# Patient Record
Sex: Female | Born: 1940 | Race: White | Hispanic: No | Marital: Married | Smoking: Never smoker
Health system: Southern US, Community
[De-identification: ages and names within clinical notes are randomized; demographics above are authoritative.]

## PROBLEM LIST (undated history)

## (undated) DIAGNOSIS — E079 Disorder of thyroid, unspecified: Secondary | ICD-10-CM

## (undated) DIAGNOSIS — M1990 Unspecified osteoarthritis, unspecified site: Secondary | ICD-10-CM

## (undated) DIAGNOSIS — C801 Malignant (primary) neoplasm, unspecified: Secondary | ICD-10-CM

## (undated) DIAGNOSIS — I10 Essential (primary) hypertension: Secondary | ICD-10-CM

## (undated) DIAGNOSIS — D649 Anemia, unspecified: Secondary | ICD-10-CM

## (undated) DIAGNOSIS — Z96659 Presence of unspecified artificial knee joint: Secondary | ICD-10-CM

## (undated) DIAGNOSIS — Z923 Personal history of irradiation: Secondary | ICD-10-CM

## (undated) DIAGNOSIS — C8510 Unspecified B-cell lymphoma, unspecified site: Secondary | ICD-10-CM

## (undated) HISTORY — DX: Unspecified osteoarthritis, unspecified site: M19.90

## (undated) HISTORY — DX: Presence of unspecified artificial knee joint: Z96.659

## (undated) HISTORY — PX: TOTAL KNEE ARTHROPLASTY: SHX125

## (undated) HISTORY — DX: Personal history of irradiation: Z92.3

## (undated) HISTORY — DX: Disorder of thyroid, unspecified: E07.9

## (undated) HISTORY — DX: Essential (primary) hypertension: I10

## (undated) HISTORY — DX: Unspecified B-cell lymphoma, unspecified site: C85.10

---

## 2005-09-04 ENCOUNTER — Encounter: Admission: RE | Admit: 2005-09-04 | Discharge: 2005-12-03 | Payer: Self-pay | Admitting: *Deleted

## 2006-01-16 ENCOUNTER — Encounter: Admission: RE | Admit: 2006-01-16 | Discharge: 2006-01-16 | Payer: Self-pay | Admitting: Otolaryngology

## 2010-01-18 ENCOUNTER — Ambulatory Visit (HOSPITAL_COMMUNITY): Admission: RE | Admit: 2010-01-18 | Discharge: 2010-01-18 | Payer: Self-pay | Admitting: Orthopedic Surgery

## 2010-03-20 ENCOUNTER — Inpatient Hospital Stay (HOSPITAL_COMMUNITY): Admission: RE | Admit: 2010-03-20 | Discharge: 2010-03-23 | Payer: Self-pay | Admitting: Orthopedic Surgery

## 2010-11-11 LAB — CBC
HCT: 30.9 % — ABNORMAL LOW (ref 36.0–46.0)
HCT: 35.1 % — ABNORMAL LOW (ref 36.0–46.0)
HCT: 37.7 % (ref 36.0–46.0)
Hemoglobin: 10.1 g/dL — ABNORMAL LOW (ref 12.0–15.0)
Hemoglobin: 10.6 g/dL — ABNORMAL LOW (ref 12.0–15.0)
Hemoglobin: 12 g/dL (ref 12.0–15.0)
Hemoglobin: 13 g/dL (ref 12.0–15.0)
MCH: 30 pg (ref 26.0–34.0)
MCH: 30.3 pg (ref 26.0–34.0)
MCHC: 34.3 g/dL (ref 30.0–36.0)
MCV: 86.9 fL (ref 78.0–100.0)
MCV: 88.1 fL (ref 78.0–100.0)
MCV: 88.3 fL (ref 78.0–100.0)
Platelets: 249 10*3/uL (ref 150–400)
RDW: 13.5 % (ref 11.5–15.5)
RDW: 13.9 % (ref 11.5–15.5)
WBC: 10.8 10*3/uL — ABNORMAL HIGH (ref 4.0–10.5)
WBC: 12.4 10*3/uL — ABNORMAL HIGH (ref 4.0–10.5)
WBC: 6.7 10*3/uL (ref 4.0–10.5)

## 2010-11-11 LAB — BASIC METABOLIC PANEL
BUN: 12 mg/dL (ref 6–23)
BUN: 13 mg/dL (ref 6–23)
Chloride: 108 mEq/L (ref 96–112)
GFR calc non Af Amer: >60 mL/min (ref >60)
Sodium: 139 mEq/L (ref 135–145)
Sodium: 140 mEq/L (ref 135–145)

## 2010-11-11 LAB — URINALYSIS, ROUTINE W REFLEX MICROSCOPIC
Nitrite: NEGATIVE
Protein, ur: NEGATIVE mg/dL
Urobilinogen, UA: 0.2 mg/dL (ref 0.0–1.0)

## 2010-11-11 LAB — GRAM STAIN: Gram Stain: NONE SEEN

## 2010-11-11 LAB — PROTIME-INR
INR: 1.04 (ref 0.00–1.49)
INR: 1.31 (ref 0.00–1.49)
Prothrombin Time: 13.5 seconds (ref 11.6–15.2)
Prothrombin Time: 13.7 seconds (ref 11.6–15.2)
Prothrombin Time: 16.2 seconds — ABNORMAL HIGH (ref 11.6–15.2)

## 2010-11-11 LAB — COMPREHENSIVE METABOLIC PANEL
AST: 19 U/L (ref 0–37)
Creatinine, Ser: 0.76 mg/dL (ref 0.4–1.2)
GFR calc non Af Amer: >60 mL/min (ref >60)

## 2010-11-11 LAB — URINE MICROSCOPIC-ADD ON

## 2010-11-11 LAB — APTT: aPTT: 30 seconds (ref 24–37)

## 2010-11-11 LAB — TYPE AND SCREEN
ABO/RH(D): A POS
Antibody Screen: NEGATIVE

## 2010-11-11 LAB — ANAEROBIC CULTURE

## 2010-11-11 LAB — WOUND CULTURE

## 2010-11-11 LAB — SURGICAL PCR SCREEN: MRSA, PCR: NEGATIVE

## 2011-04-06 ENCOUNTER — Other Ambulatory Visit: Payer: Self-pay | Admitting: Emergency Medicine

## 2011-04-06 DIAGNOSIS — R590 Localized enlarged lymph nodes: Secondary | ICD-10-CM

## 2011-04-09 ENCOUNTER — Ambulatory Visit
Admission: RE | Admit: 2011-04-09 | Discharge: 2011-04-09 | Disposition: A | Discharge status: Home / Self Care | Payer: Medicare Other | Source: Ambulatory Visit | Attending: Emergency Medicine | Admitting: Emergency Medicine

## 2011-04-09 DIAGNOSIS — R590 Localized enlarged lymph nodes: Secondary | ICD-10-CM

## 2011-04-09 MED ORDER — IOHEXOL 300 MG/ML  SOLN
75.0000 mL | Freq: Once | INTRAMUSCULAR | Status: AC | PRN
  Administered 2011-04-09: 75 mL via INTRAVENOUS

## 2011-04-12 ENCOUNTER — Other Ambulatory Visit: Payer: Self-pay | Admitting: Emergency Medicine

## 2011-04-12 DIAGNOSIS — R599 Enlarged lymph nodes, unspecified: Secondary | ICD-10-CM

## 2011-04-17 ENCOUNTER — Ambulatory Visit (HOSPITAL_COMMUNITY)
Admission: RE | Admit: 2011-04-17 | Discharge: 2011-04-17 | Disposition: A | Discharge status: Home / Self Care | Payer: Medicare Other | Source: Ambulatory Visit | Attending: Emergency Medicine | Admitting: Emergency Medicine

## 2011-04-17 ENCOUNTER — Other Ambulatory Visit: Payer: Self-pay | Admitting: Interventional Radiology

## 2011-04-17 DIAGNOSIS — R599 Enlarged lymph nodes, unspecified: Secondary | ICD-10-CM | POA: Insufficient documentation

## 2011-04-17 LAB — BASIC METABOLIC PANEL
BUN: 21 mg/dL (ref 6–23)
Calcium: 10.1 mg/dL (ref 8.4–10.5)
Creatinine, Ser: 0.64 mg/dL (ref 0.50–1.10)
GFR calc non Af Amer: >60 mL/min (ref >60)
Glucose, Bld: 91 mg/dL (ref 70–99)
Sodium: 140 mEq/L (ref 135–145)

## 2011-04-17 LAB — CBC
MCH: 28.8 pg (ref 26.0–34.0)
Platelets: 298 10*3/uL (ref 150–400)
RBC: 4.75 MIL/uL (ref 3.87–5.11)

## 2011-04-17 LAB — PROTIME-INR: Prothrombin Time: 12.9 seconds (ref 11.6–15.2)

## 2011-04-18 ENCOUNTER — Other Ambulatory Visit: Payer: Self-pay | Admitting: Interventional Radiology

## 2011-04-26 ENCOUNTER — Other Ambulatory Visit: Payer: Self-pay | Admitting: Otolaryngology

## 2011-05-03 ENCOUNTER — Encounter (HOSPITAL_BASED_OUTPATIENT_CLINIC_OR_DEPARTMENT_OTHER)
Admission: RE | Admit: 2011-05-03 | Discharge: 2011-05-03 | Disposition: A | Discharge status: Home / Self Care | Payer: Medicare Other | Source: Ambulatory Visit | Attending: Otolaryngology | Admitting: Otolaryngology

## 2011-05-03 LAB — BASIC METABOLIC PANEL
BUN: 29 mg/dL — ABNORMAL HIGH (ref 6–23)
Calcium: 9.2 mg/dL (ref 8.4–10.5)
Creatinine, Ser: 0.64 mg/dL (ref 0.50–1.10)
GFR calc Af Amer: >60 mL/min (ref >60)
GFR calc non Af Amer: >60 mL/min (ref >60)
Potassium: 4.8 mEq/L (ref 3.5–5.1)

## 2011-05-04 ENCOUNTER — Other Ambulatory Visit: Payer: Self-pay | Admitting: Otolaryngology

## 2011-05-04 ENCOUNTER — Ambulatory Visit (HOSPITAL_BASED_OUTPATIENT_CLINIC_OR_DEPARTMENT_OTHER)
Admission: RE | Admit: 2011-05-04 | Discharge: 2011-05-04 | Disposition: A | Discharge status: Home / Self Care | Payer: Medicare Other | Source: Ambulatory Visit | Attending: Otolaryngology | Admitting: Otolaryngology

## 2011-05-04 DIAGNOSIS — I10 Essential (primary) hypertension: Secondary | ICD-10-CM | POA: Insufficient documentation

## 2011-05-04 DIAGNOSIS — Z01812 Encounter for preprocedural laboratory examination: Secondary | ICD-10-CM | POA: Insufficient documentation

## 2011-05-04 DIAGNOSIS — C8510 Unspecified B-cell lymphoma, unspecified site: Secondary | ICD-10-CM

## 2011-05-04 DIAGNOSIS — R599 Enlarged lymph nodes, unspecified: Secondary | ICD-10-CM | POA: Insufficient documentation

## 2011-05-04 HISTORY — PX: NECK LESION BIOPSY: SHX2078

## 2011-05-04 HISTORY — DX: Unspecified B-cell lymphoma, unspecified site: C85.10

## 2011-05-18 ENCOUNTER — Other Ambulatory Visit: Payer: Self-pay | Admitting: Hematology and Oncology

## 2011-05-18 ENCOUNTER — Encounter: Payer: Managed Care, Other (non HMO) | Admitting: Hematology and Oncology

## 2011-05-18 ENCOUNTER — Encounter (HOSPITAL_BASED_OUTPATIENT_CLINIC_OR_DEPARTMENT_OTHER): Payer: Medicare Other | Admitting: Hematology and Oncology

## 2011-05-18 DIAGNOSIS — C8581 Other specified types of non-Hodgkin lymphoma, lymph nodes of head, face, and neck: Secondary | ICD-10-CM

## 2011-05-18 DIAGNOSIS — C8588 Other specified types of non-Hodgkin lymphoma, lymph nodes of multiple sites: Secondary | ICD-10-CM

## 2011-05-18 DIAGNOSIS — C8590 Non-Hodgkin lymphoma, unspecified, unspecified site: Secondary | ICD-10-CM

## 2011-05-18 LAB — CBC WITH DIFFERENTIAL/PLATELET
EOS%: 1.1 % (ref 0.0–7.0)
MCH: 29.7 pg (ref 25.1–34.0)
MCHC: 34 g/dL (ref 31.5–36.0)
MCV: 87.5 fL (ref 79.5–101.0)
MONO%: 6.1 % (ref 0.0–14.0)
RBC: 4.6 10*6/uL (ref 3.70–5.45)
RDW: 13.5 % (ref 11.2–14.5)

## 2011-05-19 LAB — HEPATITIS B SURFACE ANTIGEN: Hepatitis B Surface Ag: NEGATIVE

## 2011-05-19 LAB — COMPREHENSIVE METABOLIC PANEL
Albumin: 4.4 g/dL (ref 3.5–5.2)
Alkaline Phosphatase: 90 U/L (ref 39–117)
BUN: 20 mg/dL (ref 6–23)
Calcium: 9.2 mg/dL (ref 8.4–10.5)
Creatinine, Ser: 0.78 mg/dL (ref 0.50–1.10)
Glucose, Bld: 96 mg/dL (ref 70–99)
Potassium: 4.9 mEq/L (ref 3.5–5.3)

## 2011-05-19 LAB — LACTATE DEHYDROGENASE: LDH: 245 U/L (ref 94–250)

## 2011-05-19 LAB — HEPATITIS A ANTIBODY, TOTAL: Hep A Total Ab: POSITIVE — AB

## 2011-05-19 LAB — URIC ACID: Uric Acid, Serum: 6.5 mg/dL (ref 2.4–7.0)

## 2011-05-21 ENCOUNTER — Other Ambulatory Visit: Payer: Self-pay | Admitting: Hematology and Oncology

## 2011-05-21 DIAGNOSIS — C8590 Non-Hodgkin lymphoma, unspecified, unspecified site: Secondary | ICD-10-CM

## 2011-05-22 ENCOUNTER — Other Ambulatory Visit: Payer: Self-pay | Admitting: Interventional Radiology

## 2011-05-22 ENCOUNTER — Ambulatory Visit (HOSPITAL_COMMUNITY): Payer: Medicare Other

## 2011-05-22 ENCOUNTER — Ambulatory Visit (HOSPITAL_COMMUNITY)
Admission: RE | Admit: 2011-05-22 | Discharge: 2011-05-22 | Disposition: A | Discharge status: Home / Self Care | Payer: Medicare Other | Source: Ambulatory Visit | Attending: Hematology and Oncology | Admitting: Hematology and Oncology

## 2011-05-22 DIAGNOSIS — I10 Essential (primary) hypertension: Secondary | ICD-10-CM | POA: Insufficient documentation

## 2011-05-22 DIAGNOSIS — E039 Hypothyroidism, unspecified: Secondary | ICD-10-CM | POA: Insufficient documentation

## 2011-05-22 DIAGNOSIS — C8589 Other specified types of non-Hodgkin lymphoma, extranodal and solid organ sites: Secondary | ICD-10-CM | POA: Insufficient documentation

## 2011-05-22 DIAGNOSIS — Z79899 Other long term (current) drug therapy: Secondary | ICD-10-CM | POA: Insufficient documentation

## 2011-05-22 DIAGNOSIS — C8590 Non-Hodgkin lymphoma, unspecified, unspecified site: Secondary | ICD-10-CM

## 2011-05-22 DIAGNOSIS — M1990 Unspecified osteoarthritis, unspecified site: Secondary | ICD-10-CM | POA: Insufficient documentation

## 2011-05-22 LAB — CBC
MCH: 29.1 pg (ref 26.0–34.0)
Platelets: 267 10*3/uL (ref 150–400)
RBC: 4.61 MIL/uL (ref 3.87–5.11)
RDW: 13.4 % (ref 11.5–15.5)
WBC: 5.8 10*3/uL (ref 4.0–10.5)

## 2011-05-22 LAB — PROTIME-INR: Prothrombin Time: 13.9 seconds (ref 11.6–15.2)

## 2011-05-24 NOTE — Op Note (Signed)
[name], [name]                 ACCOUNT NO.:  [id number]  MEDICAL RECORD NO.:  [id number]  LOCATION:                                 FACILITY:  PHYSICIAN:  Kristine Garbe. Ezzard Standing, M.D.DATE OF BIRTH:  July 19, 1941  DATE OF PROCEDURE:  05/04/2011 DATE OF DISCHARGE:                              OPERATIVE REPORT   PREOPERATIVE DIAGNOSIS:  Right neck lymphadenopathy.  POSTOPERATIVE DIAGNOSIS:  Right neck lymphadenopathy.  OPERATIONS:  Excisional biopsy of 2 posterior deep cervical right neck nodes.  SURGEON:  Kristine Garbe. Ezzard Standing, MD  ANESTHESIA:  General endotracheal.  COMPLICATIONS:  None.  BRIEF CLINICAL NOTE:  Kelly Ray is a 70 year old female who has had persistent right neck lymphadenopathy for a couple of months now.  She had a previous excisional biopsy of the largest node in the right neck which was along the accessory chain of lymph nodes performed 1 week ago. Unfortunately, the results of this biopsy revealed the node to be very necrotic and that could not get adequate tissue diagnosis of the initial lymph node biopsy as there was minimal viable cells in the lymph node. She has multiple other smaller nodes in the right neck and she is taken to operating room at this time for excisional biopsy of further nodes in the right neck.  We will plan frozen section to make sure we have enough viable lymph node to make an adequate diagnosis.  DESCRIPTION OF PROCEDURE:  After adequate endotracheal anesthesia, the right neck was prepped with Betadine solution and draped out with sterile towels.  Two separate nodes were marked out superiorly to where the previous biopsy was performed and a third node was marked out in the supraclavicular area in the right lower neck.  Initial incision was made just superior to the previous incision in the upper accessory chain of lymph node region.  Dissection was carried down through the subcutaneous tissue.  The posterior portion of the  sternocleidomastoid muscle was identified and this was divided a little bit to gain adequate access to the nodes.  Care was taken to preserve the sensory nerve.  Two nodes were identified in this area both measuring approximately 1-1.5 cm in size.  Both nodes were removed and sent in saline to Pathology.  On frozen section, Dr. Luisa Hart felt that we had enough viable tissue to make an accurate diagnosis and suspected this may represent a high-grade lymphoma.  This completed the procedure.  Hemostasis was obtained with cautery.  The wound was irrigated and closed with 3-0 chromic sutures subcutaneously and 6-0 nylon and Steri-Strips to reapproximate skin edges.  Dressing was applied.  [name] was awoke from anesthesia and transferred to the recovery room postop doing well.  DISPOSITION:  [name] was discharged home later this morning on Tylenol and Vicodin p.r.n. pain.  We will have her follow up in my office in 5 days to have sutures removed and review of final pathology.          ______________________________ Kristine Garbe Ezzard Standing, M.D.     CEN/MEDQ  D:  05/04/2011  T:  05/04/2011  Job:  [id number]  cc:   Brett Canales A. Cleta Alberts, M.D.  Electronically Signed by  Dillard Cannon M.D. on 05/24/2011 01:30:16 AM

## 2011-05-29 ENCOUNTER — Encounter (HOSPITAL_COMMUNITY)
Admission: RE | Admit: 2011-05-29 | Discharge: 2011-05-29 | Disposition: A | Discharge status: Home / Self Care | Payer: Medicare Other | Source: Ambulatory Visit | Attending: Hematology and Oncology | Admitting: Hematology and Oncology

## 2011-05-29 DIAGNOSIS — C8588 Other specified types of non-Hodgkin lymphoma, lymph nodes of multiple sites: Secondary | ICD-10-CM | POA: Insufficient documentation

## 2011-05-29 DIAGNOSIS — C8590 Non-Hodgkin lymphoma, unspecified, unspecified site: Secondary | ICD-10-CM

## 2011-05-29 DIAGNOSIS — R599 Enlarged lymph nodes, unspecified: Secondary | ICD-10-CM | POA: Insufficient documentation

## 2011-05-29 DIAGNOSIS — Z90710 Acquired absence of both cervix and uterus: Secondary | ICD-10-CM | POA: Insufficient documentation

## 2011-05-29 LAB — GLUCOSE, CAPILLARY: Glucose-Capillary: 96 mg/dL (ref 70–99)

## 2011-05-29 MED ORDER — FLUDEOXYGLUCOSE F - 18 (FDG) INJECTION
18.9000 | Freq: Once | INTRAVENOUS | Status: AC | PRN
  Administered 2011-05-29: 18.9 via INTRAVENOUS

## 2011-05-30 ENCOUNTER — Encounter (HOSPITAL_BASED_OUTPATIENT_CLINIC_OR_DEPARTMENT_OTHER): Payer: Medicare Other | Admitting: Hematology and Oncology

## 2011-05-30 DIAGNOSIS — C8588 Other specified types of non-Hodgkin lymphoma, lymph nodes of multiple sites: Secondary | ICD-10-CM

## 2011-05-30 DIAGNOSIS — Z23 Encounter for immunization: Secondary | ICD-10-CM

## 2011-05-31 ENCOUNTER — Ambulatory Visit (HOSPITAL_COMMUNITY)
Admission: RE | Admit: 2011-05-31 | Discharge: 2011-05-31 | Disposition: A | Discharge status: Home / Self Care | Payer: Medicare Other | Source: Ambulatory Visit | Attending: Hematology and Oncology | Admitting: Hematology and Oncology

## 2011-05-31 DIAGNOSIS — I379 Nonrheumatic pulmonary valve disorder, unspecified: Secondary | ICD-10-CM | POA: Insufficient documentation

## 2011-05-31 DIAGNOSIS — I059 Rheumatic mitral valve disease, unspecified: Secondary | ICD-10-CM

## 2011-05-31 DIAGNOSIS — I080 Rheumatic disorders of both mitral and aortic valves: Secondary | ICD-10-CM | POA: Insufficient documentation

## 2011-05-31 DIAGNOSIS — C50919 Malignant neoplasm of unspecified site of unspecified female breast: Secondary | ICD-10-CM | POA: Insufficient documentation

## 2011-05-31 DIAGNOSIS — Z01818 Encounter for other preprocedural examination: Secondary | ICD-10-CM | POA: Insufficient documentation

## 2011-06-01 ENCOUNTER — Ambulatory Visit (HOSPITAL_COMMUNITY)
Admission: RE | Admit: 2011-06-01 | Discharge: 2011-06-01 | Disposition: A | Discharge status: Home / Self Care | Payer: Medicare Other | Source: Ambulatory Visit | Attending: Hematology and Oncology | Admitting: Hematology and Oncology

## 2011-06-01 ENCOUNTER — Other Ambulatory Visit: Payer: Self-pay | Admitting: Hematology and Oncology

## 2011-06-01 ENCOUNTER — Other Ambulatory Visit (HOSPITAL_COMMUNITY): Payer: Managed Care, Other (non HMO)

## 2011-06-01 DIAGNOSIS — C8590 Non-Hodgkin lymphoma, unspecified, unspecified site: Secondary | ICD-10-CM

## 2011-06-01 DIAGNOSIS — C8589 Other specified types of non-Hodgkin lymphoma, extranodal and solid organ sites: Secondary | ICD-10-CM | POA: Insufficient documentation

## 2011-06-01 DIAGNOSIS — E039 Hypothyroidism, unspecified: Secondary | ICD-10-CM | POA: Insufficient documentation

## 2011-06-01 DIAGNOSIS — I10 Essential (primary) hypertension: Secondary | ICD-10-CM | POA: Insufficient documentation

## 2011-06-06 ENCOUNTER — Encounter (HOSPITAL_BASED_OUTPATIENT_CLINIC_OR_DEPARTMENT_OTHER): Payer: Medicare Other | Admitting: Hematology and Oncology

## 2011-06-06 DIAGNOSIS — C8588 Other specified types of non-Hodgkin lymphoma, lymph nodes of multiple sites: Secondary | ICD-10-CM

## 2011-06-06 DIAGNOSIS — Z5111 Encounter for antineoplastic chemotherapy: Secondary | ICD-10-CM

## 2011-06-07 ENCOUNTER — Encounter (HOSPITAL_BASED_OUTPATIENT_CLINIC_OR_DEPARTMENT_OTHER): Payer: Medicare Other | Admitting: Hematology and Oncology

## 2011-06-07 DIAGNOSIS — C8588 Other specified types of non-Hodgkin lymphoma, lymph nodes of multiple sites: Secondary | ICD-10-CM

## 2011-06-22 ENCOUNTER — Encounter: Payer: Self-pay | Admitting: *Deleted

## 2011-06-26 ENCOUNTER — Other Ambulatory Visit: Payer: Self-pay | Admitting: Hematology and Oncology

## 2011-06-26 ENCOUNTER — Encounter (HOSPITAL_BASED_OUTPATIENT_CLINIC_OR_DEPARTMENT_OTHER): Payer: Medicare Other | Admitting: Hematology and Oncology

## 2011-06-26 DIAGNOSIS — C8588 Other specified types of non-Hodgkin lymphoma, lymph nodes of multiple sites: Secondary | ICD-10-CM

## 2011-06-26 DIAGNOSIS — Z79899 Other long term (current) drug therapy: Secondary | ICD-10-CM

## 2011-06-26 DIAGNOSIS — C8589 Other specified types of non-Hodgkin lymphoma, extranodal and solid organ sites: Secondary | ICD-10-CM

## 2011-06-26 DIAGNOSIS — Z23 Encounter for immunization: Secondary | ICD-10-CM

## 2011-06-26 LAB — COMPREHENSIVE METABOLIC PANEL
ALT: 14 U/L (ref 0–35)
CO2: 29 mEq/L (ref 19–32)
Calcium: 9.5 mg/dL (ref 8.4–10.5)
Chloride: 101 mEq/L (ref 96–112)
Creatinine, Ser: 0.87 mg/dL (ref 0.50–1.10)
Glucose, Bld: 93 mg/dL (ref 70–99)
Sodium: 137 mEq/L (ref 135–145)
Total Protein: 6.3 g/dL (ref 6.0–8.3)

## 2011-06-26 LAB — CBC WITH DIFFERENTIAL/PLATELET
BASO%: 0.6 % (ref 0.0–2.0)
Eosinophils Absolute: 0 10*3/uL (ref 0.0–0.5)
HCT: 36.1 % (ref 34.8–46.6)
MCHC: 33 g/dL (ref 31.5–36.0)
MONO#: 0.8 10*3/uL (ref 0.1–0.9)
NEUT#: 5.7 10*3/uL (ref 1.5–6.5)
NEUT%: 71.6 % (ref 38.4–76.8)
WBC: 7.9 10*3/uL (ref 3.9–10.3)
lymph#: 1.4 10*3/uL (ref 0.9–3.3)

## 2011-06-27 ENCOUNTER — Encounter (HOSPITAL_BASED_OUTPATIENT_CLINIC_OR_DEPARTMENT_OTHER): Payer: Medicare Other | Admitting: Hematology and Oncology

## 2011-06-27 DIAGNOSIS — C8588 Other specified types of non-Hodgkin lymphoma, lymph nodes of multiple sites: Secondary | ICD-10-CM

## 2011-06-27 DIAGNOSIS — Z5111 Encounter for antineoplastic chemotherapy: Secondary | ICD-10-CM

## 2011-06-28 ENCOUNTER — Encounter (HOSPITAL_BASED_OUTPATIENT_CLINIC_OR_DEPARTMENT_OTHER): Payer: Medicare Other | Admitting: Hematology and Oncology

## 2011-06-28 DIAGNOSIS — C8588 Other specified types of non-Hodgkin lymphoma, lymph nodes of multiple sites: Secondary | ICD-10-CM

## 2011-06-29 ENCOUNTER — Encounter (HOSPITAL_BASED_OUTPATIENT_CLINIC_OR_DEPARTMENT_OTHER): Payer: Medicare Other | Admitting: Hematology and Oncology

## 2011-06-29 DIAGNOSIS — Z5111 Encounter for antineoplastic chemotherapy: Secondary | ICD-10-CM

## 2011-06-29 DIAGNOSIS — C8588 Other specified types of non-Hodgkin lymphoma, lymph nodes of multiple sites: Secondary | ICD-10-CM

## 2011-06-30 ENCOUNTER — Encounter: Payer: Medicare Other | Admitting: Hematology and Oncology

## 2011-07-03 LAB — COMPREHENSIVE METABOLIC PANEL
ALT: 13 U/L (ref 0–35)
Albumin: 4.4 g/dL (ref 3.5–5.2)
BUN: 20 mg/dL (ref 6–23)
BUN: 20 mg/dL (ref 6–23)
CO2: 26 mEq/L (ref 19–32)
CO2: 26 mEq/L (ref 19–32)
Calcium: 9.2 mg/dL (ref 8.4–10.5)
Calcium: 9.2 mg/dL (ref 8.4–10.5)
Chloride: 101 mEq/L (ref 96–112)
Chloride: 101 mEq/L (ref 96–112)
Creatinine, Ser: 0.78 mg/dL (ref 0.50–1.10)
Glucose, Bld: 96 mg/dL (ref 70–99)
Glucose, Bld: 96 mg/dL (ref 70–99)
Potassium: 4.9 mEq/L (ref 3.5–5.3)
Sodium: 139 mEq/L (ref 135–145)
Total Protein: 6.9 g/dL (ref 6.0–8.3)

## 2011-07-03 LAB — LACTATE DEHYDROGENASE
LDH: 245 U/L (ref 94–250)
LDH: 245 U/L (ref 94–250)

## 2011-07-03 LAB — SEDIMENTATION RATE
Sed Rate: 7 mm/hr (ref 0–22)
Sed Rate: 7 mm/hr (ref 0–22)

## 2011-07-03 LAB — HEPATITIS B SURFACE ANTIGEN: Hepatitis B Surface Ag: NEGATIVE

## 2011-07-03 LAB — HEPATITIS B SURFACE ANTIBODY,QUALITATIVE: Hep B S Ab: NEGATIVE

## 2011-07-03 LAB — URIC ACID: Uric Acid, Serum: 6.5 mg/dL (ref 2.4–7.0)

## 2011-07-12 ENCOUNTER — Encounter (HOSPITAL_COMMUNITY): Payer: Self-pay

## 2011-07-12 ENCOUNTER — Telehealth: Payer: Self-pay | Admitting: *Deleted

## 2011-07-12 ENCOUNTER — Encounter (HOSPITAL_COMMUNITY)
Admission: RE | Admit: 2011-07-12 | Discharge: 2011-07-12 | Disposition: A | Discharge status: Home / Self Care | Payer: Medicare Other | Source: Ambulatory Visit | Attending: Hematology and Oncology | Admitting: Hematology and Oncology

## 2011-07-12 DIAGNOSIS — C8588 Other specified types of non-Hodgkin lymphoma, lymph nodes of multiple sites: Secondary | ICD-10-CM | POA: Insufficient documentation

## 2011-07-12 MED ORDER — FLUDEOXYGLUCOSE F - 18 (FDG) INJECTION
15.9000 | Freq: Once | INTRAVENOUS | Status: AC | PRN
  Administered 2011-07-12: 15.9 via INTRAVENOUS

## 2011-07-12 NOTE — Telephone Encounter (Signed)
Spoke with pt today and informed pt re:   PET scan result   Good  As per  Md.    Instructed pt to keep  F/u app as scheduled.    Pt voiced understanding.

## 2011-07-13 ENCOUNTER — Other Ambulatory Visit: Payer: Self-pay | Admitting: Nurse Practitioner

## 2011-07-13 ENCOUNTER — Telehealth: Payer: Self-pay | Admitting: Nurse Practitioner

## 2011-07-13 DIAGNOSIS — C8588 Other specified types of non-Hodgkin lymphoma, lymph nodes of multiple sites: Secondary | ICD-10-CM

## 2011-07-13 MED ORDER — TEMAZEPAM 15 MG PO CAPS: 15.0000 mg | ORAL_CAPSULE | Freq: Every evening | ORAL | Status: DC | PRN

## 2011-07-13 NOTE — Telephone Encounter (Signed)
RN called patient re: refill request of temazepam.  Noted pt had fill on 06/13/11 of 30 day supply and was not to be taking it every day.  Informed pt that Dr. Dalene Carrow was reluctant to refill prescription due to she was not to be taking it daily, only as needed.  Patient stated she had been taking it every day due to difficulty sleeping.   Pt stated she had never had trouble sleeping before she started chemotherapy treatments.  States that steroids from treatment and pain from Neulasta injection keep her awake. RN noted her chemo treatments were every three weeks.  RN encouraged pt to use non-medical interventions such as avoiding television, computer screen or other bright lights an hour before she plans to sleep, taking a warm bath or shower before bed, and using progressive relaxation techniques during non-treatment weeks.  Pt verbalized understanding.  RN stated would give information to Dr. Dalene Carrow and call with further information.

## 2011-07-13 NOTE — Telephone Encounter (Signed)
Called patient and informed per Dr. Dalene Carrow- Temazepam prescription refilled for 15 capsules.

## 2011-07-17 ENCOUNTER — Other Ambulatory Visit: Payer: Self-pay | Admitting: Hematology and Oncology

## 2011-07-17 ENCOUNTER — Ambulatory Visit (HOSPITAL_BASED_OUTPATIENT_CLINIC_OR_DEPARTMENT_OTHER): Payer: Medicare Other | Admitting: Physician Assistant

## 2011-07-17 ENCOUNTER — Other Ambulatory Visit: Payer: Self-pay | Admitting: *Deleted

## 2011-07-17 ENCOUNTER — Other Ambulatory Visit (HOSPITAL_BASED_OUTPATIENT_CLINIC_OR_DEPARTMENT_OTHER): Payer: Medicare Other | Admitting: Lab

## 2011-07-17 DIAGNOSIS — C8510 Unspecified B-cell lymphoma, unspecified site: Secondary | ICD-10-CM | POA: Insufficient documentation

## 2011-07-17 DIAGNOSIS — C8590 Non-Hodgkin lymphoma, unspecified, unspecified site: Secondary | ICD-10-CM

## 2011-07-17 DIAGNOSIS — C8588 Other specified types of non-Hodgkin lymphoma, lymph nodes of multiple sites: Secondary | ICD-10-CM

## 2011-07-17 DIAGNOSIS — C8589 Other specified types of non-Hodgkin lymphoma, extranodal and solid organ sites: Secondary | ICD-10-CM

## 2011-07-17 DIAGNOSIS — Z5111 Encounter for antineoplastic chemotherapy: Secondary | ICD-10-CM

## 2011-07-17 LAB — COMPREHENSIVE METABOLIC PANEL
Albumin: 3.9 g/dL (ref 3.5–5.2)
Alkaline Phosphatase: 68 U/L (ref 39–117)
BUN: 22 mg/dL (ref 6–23)
Glucose, Bld: 97 mg/dL (ref 70–99)
Potassium: 5.6 mEq/L — ABNORMAL HIGH (ref 3.5–5.3)
Total Bilirubin: 0.3 mg/dL (ref 0.3–1.2)

## 2011-07-17 LAB — CBC WITH DIFFERENTIAL/PLATELET
Basophils Absolute: 0 10*3/uL (ref 0.0–0.1)
Eosinophils Absolute: 0 10*3/uL (ref 0.0–0.5)
HGB: 11.2 g/dL — ABNORMAL LOW (ref 11.6–15.9)
LYMPH%: 15.8 % (ref 14.0–49.7)
MCV: 87.9 fL (ref 79.5–101.0)
MONO%: 17.3 % — ABNORMAL HIGH (ref 0.0–14.0)
NEUT#: 2.5 10*3/uL (ref 1.5–6.5)
Platelets: 434 10*3/uL — ABNORMAL HIGH (ref 145–400)

## 2011-07-17 MED ORDER — TEMAZEPAM 15 MG PO CAPS: 15.0000 mg | ORAL_CAPSULE | Freq: Every evening | ORAL | Status: DC | PRN

## 2011-07-17 NOTE — Progress Notes (Signed)
This office note has been dictated.

## 2011-07-17 NOTE — Progress Notes (Signed)
CC:   Kelly Ray. Kelly Ray, M.D. Kelly Ray Kelly Ray, M.D. Kelly Ray, M.D.  IDENTIFYING STATEMENT:  Kelly Ray is a 70 year old white female with large B-cell non-Hodgkin's lymphoma who presents for followup.  INTERIM HISTORY:  Kelly Ray reports since her last clinic visit on 06/26/2011, she received her 2nd cycle of chemotherapy with R-CHOP. After this cycle, she received 3 days of Neupogen injections instead of the Neulasta due to significant arthralgias that she experienced with cycle 1.  She states that she had no issues with bone pain with the Neupogen.  She does report some mild fatigue, but has no difficulty completing ADLs.  She has had no fevers, chills, or night sweats.  No dyspnea or cough.  She reports normal appetite and has had no problems with nausea, vomiting, constipation, or diarrhea.  No dysuria.  No frequency or hematuria.  No alteration in sensation or balance or swelling of extremities.  She does state that on the days that she takes her prednisone she has problems with insomnia and this is well- controlled with her p.r.n. sleeping medication.  PHYSICAL EXAMINATION:  Vital Signs:  Temperature today is 96.7.  Heart rate of 80.  Respirations 20.  Blood pressure 131/73.  Weight 204 pounds.  General Appearance:  This is a well-developed, well-nourished, white female in no acute distress.  HEENT:  Alopecia.  Sclerae are nonicteric.  There is no oral thrush or mucositis.  Skin:  Without rashes or lesions.  Lymphs:  No cervical, supraclavicular, axillary, or inguinal lymphadenopathy.  Cardiac:  Regular rate and rhythm without murmurs or gallops.  Peripheral pulses are 2+.  She has a left subclavian Port-A-Cath without signs of infection.  Chest:  Lungs are clear to auscultation.  Abdomen:  Positive bowel sounds.  Soft, nontender, and nondistended.  No organomegaly.  Extremities:  Without edema, cyanosis, or calf tenderness.  Neuro:  Alert oriented  x3. Strength, sensation, and coordination are all grossly intact.  LABORATORY DATA:  CBC with diff reveals white blood count of 3.8, hemoglobin 11.2, hematocrit of 33.1, platelets of 434, ANC of 2.5, and MCV of 87.9.  CMET is currently pending.  RADIOGRAPHIC STUDIES:  The patient had a PET scan on 07/12/2011.  This revealed interval resolution of right cervical and supraclavicular lymphadenopathy, compatible with response to treatment.  Diffuse spine FDG uptake most likely reflects medication/treatment effect.  A focus of FDG uptake within the right tonsillar pillar.  This is nonspecific and could indicate inflammation, although lymphoid tissue is present in the tonsillar pillars and lymphomatous involvement is possible.  IMPRESSION/PLAN: 1. Kelly Ray is a 70 year old white female with stage II, large B-     cell non-Hodgkin's lymphoma diagnosed on 05/04/2011 following     excisional biopsy of a right posterior cervical lymph node.  She     began treatment with R-CHOP on 06/06/2011 and is status post 2     cycles.  She had significant arthralgias with cycle 1 with the     Neulasta injections and therefore with cycle 2, she received     Neupogen 480 mcg subcutaneously for 3 days.  She had no problems     with arthralgias with this regimen.  She has had interim PET scan     after cycle 2, which revealed some response to treatment. PET scan is     reviewed with patient and her husband.  She is     scheduled to begin cycle 3 of treatment tomorrow.  Current  labs are     reviewed with Dr. Darnelle Catalan, on-call today as Dr. Dalene Carrow is out of     the office.  We will go ahead and proceed with cycle 3 of treatment     tomorrow.  The patient will also receive Neupogen injections daily     x3 after her infusion, starting on day 3 due to the Thanksgiving     holiday.  Reinforced teaching of treatment potential side effects     and management of side effects with emphasis on potential for      fatigue, myelosuppression, mucositis, nausea, vomiting, diarrhea,     peripheral neuropathy, arthralgias, and alopecia.  The patient     verbalized understanding. 2. We will plan to assess an interim blood count, CBC with diff, the     week after completing therapy and the patient will be scheduled for     followup with Dr. Dalene Carrow on 08/07/2011 at which time we will     reassess CBC with diff and CMET.  We anticipate her receiving     additional chemotherapy the day after doctor visit.  Dr. Dalene Carrow     will determine at that time if the patient will receive Neulasta     after her chemotherapy or if she will continue with Neupogen     injections.    ______________________________ Sherilyn Banker, MSN, ANP, BC RJ/MEDQ  D:  07/17/2011  T:  07/17/2011  Job:  [id number]

## 2011-07-17 NOTE — Telephone Encounter (Signed)
Duplicate order for Restoril refill today  Due to pharmacy not receiving order from  07/13/11.

## 2011-07-18 ENCOUNTER — Ambulatory Visit (HOSPITAL_BASED_OUTPATIENT_CLINIC_OR_DEPARTMENT_OTHER): Payer: Medicare Other

## 2011-07-18 ENCOUNTER — Other Ambulatory Visit: Payer: Self-pay | Admitting: Hematology and Oncology

## 2011-07-18 ENCOUNTER — Other Ambulatory Visit: Payer: Self-pay | Admitting: Certified Registered Nurse Anesthetist

## 2011-07-18 ENCOUNTER — Telehealth: Payer: Self-pay | Admitting: *Deleted

## 2011-07-18 ENCOUNTER — Other Ambulatory Visit: Payer: Self-pay | Admitting: Oncology

## 2011-07-18 VITALS — BP 100/54 | HR 77 | Temp 97.8°F

## 2011-07-18 DIAGNOSIS — C8588 Other specified types of non-Hodgkin lymphoma, lymph nodes of multiple sites: Secondary | ICD-10-CM

## 2011-07-18 DIAGNOSIS — Z5111 Encounter for antineoplastic chemotherapy: Secondary | ICD-10-CM

## 2011-07-18 DIAGNOSIS — C8510 Unspecified B-cell lymphoma, unspecified site: Secondary | ICD-10-CM

## 2011-07-18 MED ORDER — DEXAMETHASONE SODIUM PHOSPHATE 4 MG/ML IJ SOLN
20.0000 mg | Freq: Once | INTRAMUSCULAR | Status: AC
  Administered 2011-07-18: 20 mg via INTRAVENOUS

## 2011-07-18 MED ORDER — ONDANSETRON 16 MG/50ML IVPB (CHCC)
16.0000 mg | Freq: Once | INTRAVENOUS | Status: AC
  Administered 2011-07-18: 16 mg via INTRAVENOUS

## 2011-07-18 MED ORDER — DOXORUBICIN HCL CHEMO IV INJECTION 2 MG/ML
100.0000 mg | Freq: Once | INTRAVENOUS | Status: AC
  Administered 2011-07-18: 100 mg via INTRAVENOUS
  Filled 2011-07-18: qty 50

## 2011-07-18 MED ORDER — SODIUM CHLORIDE 0.9 % IV SOLN: 700.0000 mg | Freq: Once | INTRAVENOUS | Status: DC

## 2011-07-18 MED ORDER — SODIUM CHLORIDE 0.9 % IV SOLN
700.0000 mg | Freq: Once | INTRAVENOUS | Status: AC
  Administered 2011-07-18: 700 mg via INTRAVENOUS
  Filled 2011-07-18: qty 70

## 2011-07-18 MED ORDER — HEPARIN SOD (PORK) LOCK FLUSH 100 UNIT/ML IV SOLN
500.0000 [IU] | Freq: Once | INTRAVENOUS | Status: AC | PRN
  Administered 2011-07-18: 500 [IU]
  Filled 2011-07-18: qty 5

## 2011-07-18 MED ORDER — SODIUM CHLORIDE 0.9 % IV SOLN
1480.0000 mg | Freq: Once | INTRAVENOUS | Status: AC
  Administered 2011-07-18: 1480 mg via INTRAVENOUS
  Filled 2011-07-18: qty 74

## 2011-07-18 MED ORDER — VINCRISTINE SULFATE CHEMO INJECTION 1 MG/ML
2.0000 mg | Freq: Once | INTRAVENOUS | Status: AC
  Administered 2011-07-18: 2 mg via INTRAVENOUS
  Filled 2011-07-18: qty 2

## 2011-07-18 MED ORDER — SODIUM CHLORIDE 0.9 % IJ SOLN
10.0000 mL | INTRAMUSCULAR | Status: DC | PRN
  Administered 2011-07-18: 10 mL
  Filled 2011-07-18: qty 10

## 2011-07-18 MED ORDER — DIPHENHYDRAMINE HCL 25 MG PO CAPS
50.0000 mg | ORAL_CAPSULE | Freq: Once | ORAL | Status: AC
  Administered 2011-07-18: 50 mg via ORAL

## 2011-07-18 MED ORDER — ACETAMINOPHEN 325 MG PO TABS
650.0000 mg | ORAL_TABLET | Freq: Once | ORAL | Status: AC
  Administered 2011-07-18: 650 mg via ORAL

## 2011-07-18 MED ORDER — SODIUM CHLORIDE 0.9 % IV SOLN
Freq: Once | INTRAVENOUS | Status: AC
  Administered 2011-07-18: 09:00:00 via INTRAVENOUS

## 2011-07-18 NOTE — Telephone Encounter (Signed)
Faxed CMET results done 07/17/11  To pt's primary  Dr. Ellis Parents  As per Melina Schools, NP's  Instructions.

## 2011-07-18 NOTE — Patient Instructions (Signed)
Patient aware of next appointment; Rx for pain and nausea meds.

## 2011-07-19 ENCOUNTER — Other Ambulatory Visit: Payer: Self-pay | Admitting: Hematology and Oncology

## 2011-07-20 ENCOUNTER — Ambulatory Visit (HOSPITAL_BASED_OUTPATIENT_CLINIC_OR_DEPARTMENT_OTHER): Payer: Medicare Other

## 2011-07-20 VITALS — BP 123/77 | HR 72 | Temp 97.3°F

## 2011-07-20 DIAGNOSIS — C8588 Other specified types of non-Hodgkin lymphoma, lymph nodes of multiple sites: Secondary | ICD-10-CM

## 2011-07-20 DIAGNOSIS — C8510 Unspecified B-cell lymphoma, unspecified site: Secondary | ICD-10-CM

## 2011-07-20 MED ORDER — FILGRASTIM 480 MCG/0.8ML IJ SOLN
480.0000 ug | Freq: Once | INTRAMUSCULAR | Status: AC
  Administered 2011-07-20: 480 ug via SUBCUTANEOUS
  Filled 2011-07-20: qty 0.8

## 2011-07-21 ENCOUNTER — Ambulatory Visit (HOSPITAL_BASED_OUTPATIENT_CLINIC_OR_DEPARTMENT_OTHER): Payer: Medicare Other

## 2011-07-21 VITALS — BP 137/89 | HR 59 | Temp 98.3°F

## 2011-07-21 DIAGNOSIS — C8588 Other specified types of non-Hodgkin lymphoma, lymph nodes of multiple sites: Secondary | ICD-10-CM

## 2011-07-21 DIAGNOSIS — C8510 Unspecified B-cell lymphoma, unspecified site: Secondary | ICD-10-CM

## 2011-07-21 MED ORDER — FILGRASTIM 480 MCG/0.8ML IJ SOLN
480.0000 ug | Freq: Once | INTRAMUSCULAR | Status: AC
  Administered 2011-07-21: 480 ug via SUBCUTANEOUS

## 2011-07-23 ENCOUNTER — Ambulatory Visit (HOSPITAL_BASED_OUTPATIENT_CLINIC_OR_DEPARTMENT_OTHER): Payer: Medicare Other

## 2011-07-23 ENCOUNTER — Telehealth: Payer: Self-pay | Admitting: *Deleted

## 2011-07-23 VITALS — BP 122/66 | HR 84 | Temp 97.1°F

## 2011-07-23 DIAGNOSIS — C8588 Other specified types of non-Hodgkin lymphoma, lymph nodes of multiple sites: Secondary | ICD-10-CM

## 2011-07-23 DIAGNOSIS — C8510 Unspecified B-cell lymphoma, unspecified site: Secondary | ICD-10-CM

## 2011-07-23 MED ORDER — FILGRASTIM 480 MCG/0.8ML IJ SOLN
480.0000 ug | Freq: Once | INTRAMUSCULAR | Status: AC
  Administered 2011-07-23: 480 ug via SUBCUTANEOUS
  Filled 2011-07-23: qty 0.8

## 2011-07-23 NOTE — Telephone Encounter (Signed)
ON 07/17/11 ROBYN JONES-HAYES,NP SAW PT TO DISCUSS THE RESULTS OF THE PET SCAN DONE ON 07/12/11. THE PT. THOUGHT DR.ODOGWU SAID SHE WOULD CALL PT WITH RESULTS ON Friday SO PT. WAS ALSO EXPECTING A CALL FROM DR.ODOGWU. PT. NOW UNDERSTANDS THAT DR.ODOGWU REVIEWED THE PET SCAN RESULTS AND ROBYN JONES-HAYES,NP DISCUSSED WITH PT. THE FINDINGS. PT. WAS ALSO INFORMED THAT HER CMET WAS SENT TO HER PRIMARY CARE PHYSICIAN BECAUSE HER POTASSIUM WAS SLIGHTLY ELEVATED. SHE NEEDS TO KEEP THE Thursday APPOINTMENT WITH DR. DAUB SO HE MAY ADDRESS THAT ISSUE. PT. VOICES UNDERSTANDING.

## 2011-07-23 NOTE — Telephone Encounter (Signed)
PT. IS COMING TODAY AT 10:45AM FOR AN INJECTION. SHE WOULD LIKE TO HAVE THE RESULTS OF HER PET SCAN. ALSO HER PRIMARY CARE PHYSICIAN RECEIVED A COPY OF PT,'S LAB RESULTS AND WANTS TO SEE PT "BECAUSE THE RESULTS ARE NOT GOOD" SO WOULD LIKE TO DISCUSS HER LAB RESULTS. THIS NOTE TO ROBYN JONES-HAYES,NP.

## 2011-07-26 ENCOUNTER — Other Ambulatory Visit: Payer: Self-pay

## 2011-07-26 ENCOUNTER — Telehealth: Payer: Self-pay

## 2011-07-26 DIAGNOSIS — C8588 Other specified types of non-Hodgkin lymphoma, lymph nodes of multiple sites: Secondary | ICD-10-CM

## 2011-07-26 MED ORDER — LEVOFLOXACIN 500 MG PO TABS: 500.0000 mg | ORAL_TABLET | Freq: Every day | ORAL | Status: AC

## 2011-07-26 NOTE — Telephone Encounter (Signed)
Received call from Dr. Cleta Alberts at [facility] stating that he saw pt today for her high potassium faxed to him by this office, and he drew labs today, CBC and renal panel, and pt's WBC was 0.8.  He states renal panel is not back until tomorrow morning, and he will fax when available.  He wanted to make Dr. Dalene Carrow aware of pt's WBC, but was aware that pt had some type of shot for her WBC's.  He states pt was afebrile at his office with temp of 98.6.  He faxed over these labs, which were shown to Dr. Arline Asp, as Dr. Dalene Carrow was not in office today. Called pt at home, and spoke with her husband, who stated pt was lying down.  Informed him of low wbc, and he stated pt has not had fever, cough, and was not on any antibiotic.  He states pt says the neupogen injections do not make her feel good, and she did not want to take any more.  Informed him will speak with MD and call him back.  Per Dr. Arline Asp, pt to start levaquin 500 mg daily x 5 days, as long as BUN/creat are normal, and he recommends pt may need more neupogen.  Showed him BUN/creat from 07/17/11, and informed him that renal panel not available until in the morning.  He states for pt to call office if she develops fever, cough, pain, UTI symptoms, and inform Dr. Dalene Carrow tomorrow.  Called pt's husband back and informed him of above, and he verbalizes understanding.  Prescription called in to [facility] [address]  Message left for desk RN to review with Dr. Dalene Carrow in the morning.

## 2011-07-27 ENCOUNTER — Encounter: Payer: Self-pay | Admitting: *Deleted

## 2011-07-27 NOTE — Progress Notes (Signed)
Daub called reporting patient's BUN = 19 and her Creat. = 0.6.  Awaiting faxed report.  Will notify providers.

## 2011-07-30 ENCOUNTER — Telehealth: Payer: Self-pay | Admitting: *Deleted

## 2011-07-30 NOTE — Telephone Encounter (Signed)
Kelly Ray called reporting she feels dizzy and light headed.  Has been having problems with her potassium level and then was told her white cell count was low.  B/p = 99/63 and 111/70 so asked if her white cell counts are affecting her b/p.  "With the potassium count being low, my PCP cut my dose of lisinopril/HCTZ in half.  The next day I was told my potassium level is normal."  I feel very off balanced.  Instructed to dangle before getting up to move.  Call primary care with these b/p readings as they may want to discontinue b/p meds.

## 2011-08-05 ENCOUNTER — Other Ambulatory Visit: Payer: Self-pay | Admitting: Hematology and Oncology

## 2011-08-07 ENCOUNTER — Other Ambulatory Visit (HOSPITAL_BASED_OUTPATIENT_CLINIC_OR_DEPARTMENT_OTHER): Payer: Medicare Other | Admitting: Lab

## 2011-08-07 ENCOUNTER — Other Ambulatory Visit: Payer: Self-pay | Admitting: Hematology and Oncology

## 2011-08-07 ENCOUNTER — Ambulatory Visit (HOSPITAL_BASED_OUTPATIENT_CLINIC_OR_DEPARTMENT_OTHER): Payer: Medicare Other | Admitting: Hematology and Oncology

## 2011-08-07 VITALS — BP 138/79 | HR 91 | Temp 97.8°F | Ht 63.5 in | Wt 206.6 lb

## 2011-08-07 DIAGNOSIS — C8589 Other specified types of non-Hodgkin lymphoma, extranodal and solid organ sites: Secondary | ICD-10-CM

## 2011-08-07 DIAGNOSIS — Z23 Encounter for immunization: Secondary | ICD-10-CM

## 2011-08-07 DIAGNOSIS — C8588 Other specified types of non-Hodgkin lymphoma, lymph nodes of multiple sites: Secondary | ICD-10-CM

## 2011-08-07 DIAGNOSIS — C8590 Non-Hodgkin lymphoma, unspecified, unspecified site: Secondary | ICD-10-CM

## 2011-08-07 LAB — CBC WITH DIFFERENTIAL/PLATELET
Basophils Absolute: 0 10*3/uL (ref 0.0–0.1)
Eosinophils Absolute: 0 10*3/uL (ref 0.0–0.5)
HGB: 10.4 g/dL — ABNORMAL LOW (ref 11.6–15.9)
MCV: 89.4 fL (ref 79.5–101.0)
MONO#: 0.6 10*3/uL (ref 0.1–0.9)
MONO%: 11.3 % (ref 0.0–14.0)
NEUT#: 3.8 10*3/uL (ref 1.5–6.5)
RDW: 17.9 % — ABNORMAL HIGH (ref 11.2–14.5)
WBC: 5.2 10*3/uL (ref 3.9–10.3)

## 2011-08-07 LAB — COMPREHENSIVE METABOLIC PANEL
Albumin: 3.9 g/dL (ref 3.5–5.2)
BUN: 20 mg/dL (ref 6–23)
CO2: 26 mEq/L (ref 19–32)
Calcium: 9.4 mg/dL (ref 8.4–10.5)
Chloride: 106 mEq/L (ref 96–112)
Glucose, Bld: 117 mg/dL — ABNORMAL HIGH (ref 70–99)
Potassium: 4.8 mEq/L (ref 3.5–5.3)
Total Protein: 6 g/dL (ref 6.0–8.3)

## 2011-08-07 NOTE — Progress Notes (Signed)
CC:   Ollen Gross, M.D. Stan Head Cleta Alberts, M.D. Louis Eliacin  IDENTIFYING STATEMENT:  The patient is a 70 year old woman with a large B-cell non-Hodgkin's lymphoma who presents for followup.  INTERIM HISTORY:  Mrs. Shuart had afebrile neutropenia with her last treatment.  She was managed with Avelox.  She also had issues with blood pressure medications which has since discontinued.  Otherwise, she feels well.  She has no nausea, vomiting, abdominal pain.  Energy levels are fair.  She does not reports mucositis.  She has not noted any palpable adenopathy.  MEDICATIONS:  Reviewed and updated.  ALLERGIES:  None.  Past medical history, family history and social history unchanged.  REVIEW OF SYSTEMS:  Ten-point review of systems negative.  PHYSICAL EXAM:  General:  The patient is a well-appearing, well- nourished woman in no distress.  Vitals:  Pulse 91, blood pressure 138/79, temperature 97.8, respirations 20, weight 206.6 pounds.  HEENT: Head is atraumatic, normocephalic.  Sclerae anicteric.  Mouth is moist. Chest:  Clear.  Port-A-Cath with no sign of infection.  Abdomen:  Soft, nontender.  Bowel sounds present.  Extremities:  No edema.  No calf tenderness.  CNS:  Nonfocal.  Lymph nodes:  No adenopathy.  LAB DATA:  On 08/07/2011, white cell count 5.2, hemoglobin 10.4, hematocrit 30.5, platelets 587.  C-MET pending.  IMPRESSION AND PLAN:  Mrs. Flannery is a 70 year old woman with stage II large B-cell non-Hodgkin's lymphoma diagnosed on 05/04/2011 following excisional biopsy of right posterior cervical lymph node.  She began treatment with R-CHOP on 06/06/2011, and has had 3 cycles.  She had significant arthralgias following Neulasta and has required Neupogen support with the last cycle.  She has done fairly well thus far and had significant response to therapy after 2nd cycle.  Her labs are currently adequate.  With this said, she proceeds for cycle 4 in the morning followed by 6  days of Neupogen dosed at 480 mg daily.  She follows up early next year prior to cycle 5.  I spent more than half the time coordinating care.    ______________________________ Laurice Record, M.D. LIO/MEDQ  D:  08/07/2011  T:  08/07/2011  Job:  [id number]

## 2011-08-07 NOTE — Progress Notes (Signed)
This office note has been dictated.

## 2011-08-08 ENCOUNTER — Ambulatory Visit (HOSPITAL_BASED_OUTPATIENT_CLINIC_OR_DEPARTMENT_OTHER): Payer: Medicare Other

## 2011-08-08 ENCOUNTER — Telehealth: Payer: Self-pay | Admitting: Hematology and Oncology

## 2011-08-08 VITALS — BP 114/67 | HR 78 | Temp 97.3°F

## 2011-08-08 DIAGNOSIS — Z5111 Encounter for antineoplastic chemotherapy: Secondary | ICD-10-CM

## 2011-08-08 DIAGNOSIS — C8510 Unspecified B-cell lymphoma, unspecified site: Secondary | ICD-10-CM

## 2011-08-08 DIAGNOSIS — C8588 Other specified types of non-Hodgkin lymphoma, lymph nodes of multiple sites: Secondary | ICD-10-CM

## 2011-08-08 DIAGNOSIS — Z5112 Encounter for antineoplastic immunotherapy: Secondary | ICD-10-CM

## 2011-08-08 MED ORDER — DIPHENHYDRAMINE HCL 25 MG PO CAPS
50.0000 mg | ORAL_CAPSULE | Freq: Once | ORAL | Status: AC
  Administered 2011-08-08: 50 mg via ORAL

## 2011-08-08 MED ORDER — SODIUM CHLORIDE 0.9 % IV SOLN
750.0000 mg/m2 | Freq: Once | INTRAVENOUS | Status: AC
  Administered 2011-08-08: 1540 mg via INTRAVENOUS
  Filled 2011-08-08: qty 77

## 2011-08-08 MED ORDER — DEXAMETHASONE SODIUM PHOSPHATE 4 MG/ML IJ SOLN
20.0000 mg | Freq: Once | INTRAMUSCULAR | Status: AC
  Administered 2011-08-08: 20 mg via INTRAVENOUS

## 2011-08-08 MED ORDER — ACETAMINOPHEN 325 MG PO TABS
650.0000 mg | ORAL_TABLET | Freq: Once | ORAL | Status: AC
  Administered 2011-08-08: 650 mg via ORAL

## 2011-08-08 MED ORDER — SODIUM CHLORIDE 0.9 % IJ SOLN
10.0000 mL | INTRAMUSCULAR | Status: DC | PRN
  Administered 2011-08-08: 10 mL
  Filled 2011-08-08: qty 10

## 2011-08-08 MED ORDER — SODIUM CHLORIDE 0.9 % IV SOLN
Freq: Once | INTRAVENOUS | Status: AC
  Administered 2011-08-08: 10:00:00 via INTRAVENOUS

## 2011-08-08 MED ORDER — DOXORUBICIN HCL CHEMO IV INJECTION 2 MG/ML
50.0000 mg/m2 | Freq: Once | INTRAVENOUS | Status: AC
  Administered 2011-08-08: 102 mg via INTRAVENOUS
  Filled 2011-08-08: qty 51

## 2011-08-08 MED ORDER — SODIUM CHLORIDE 0.9 % IV SOLN
375.0000 mg/m2 | Freq: Once | INTRAVENOUS | Status: AC
  Administered 2011-08-08: 800 mg via INTRAVENOUS
  Filled 2011-08-08: qty 80

## 2011-08-08 MED ORDER — HEPARIN SOD (PORK) LOCK FLUSH 100 UNIT/ML IV SOLN
500.0000 [IU] | Freq: Once | INTRAVENOUS | Status: AC | PRN
Start: 2011-08-08 — End: 2011-08-08
  Administered 2011-08-08: 500 [IU]
  Filled 2011-08-08: qty 5

## 2011-08-08 MED ORDER — VINCRISTINE SULFATE CHEMO INJECTION 1 MG/ML
2.0000 mg | Freq: Once | INTRAVENOUS | Status: AC
  Administered 2011-08-08: 2 mg via INTRAVENOUS
  Filled 2011-08-08: qty 2

## 2011-08-08 MED ORDER — SODIUM CHLORIDE 0.9 % IV SOLN: 375.0000 mg/m2 | Freq: Once | INTRAVENOUS | Status: DC

## 2011-08-08 MED ORDER — ONDANSETRON 16 MG/50ML IVPB (CHCC)
16.0000 mg | Freq: Once | INTRAVENOUS | Status: AC
  Administered 2011-08-08: 16 mg via INTRAVENOUS

## 2011-08-08 NOTE — Patient Instructions (Signed)
[facility] Discharge Instructions for Patients Receiving Chemotherapy  Today you received the following chemotherapy agents Adriamycin/Vincristine/Cytoxan/Rituxan (R-CHOP)  To help prevent nausea and vomiting after your treatment, we encourage you to take your nausea medication as directed by MD  If you develop nausea and vomiting that is not controlled by your nausea medication, call the clinic. If it is after clinic hours your family physician or the after hours number for the clinic or go to the Emergency Department.   BELOW ARE SYMPTOMS THAT SHOULD BE REPORTED IMMEDIATELY:  *FEVER GREATER THAN 100.5 F  *CHILLS WITH OR WITHOUT FEVER  NAUSEA AND VOMITING THAT IS NOT CONTROLLED WITH YOUR NAUSEA MEDICATION  *UNUSUAL SHORTNESS OF BREATH  *UNUSUAL BRUISING OR BLEEDING  TENDERNESS IN MOUTH AND THROAT WITH OR WITHOUT PRESENCE OF ULCERS  *URINARY PROBLEMS  *BOWEL PROBLEMS  UNUSUAL RASH Items with * indicate a potential emergency and should be followed up as soon as possible.  One of the nurses will contact you 24 hours after your first treatment. Please let the nurse know about any problems that you may have experienced. Feel free to call the clinic you have any questions or concerns. The clinic phone number is [phone].   I have been informed and understand all the instructions given to me. I know to contact the clinic, my physician, or go to the Emergency Department if any problems should occur. I do not have any questions at this time, but understand that I may call the clinic during office hours   should I have any questions or need assistance in obtaining follow up care.    __________________________________________  _____________  __________ Signature of Patient or Authorized Representative            Date                   Time    __________________________________________ Nurse's Signature

## 2011-08-08 NOTE — Telephone Encounter (Signed)
gve the pt''s husband the dec,jan 2013 appt calenndar

## 2011-08-09 ENCOUNTER — Ambulatory Visit (HOSPITAL_BASED_OUTPATIENT_CLINIC_OR_DEPARTMENT_OTHER): Payer: Medicare Other

## 2011-08-09 VITALS — BP 116/62 | HR 70 | Temp 97.4°F

## 2011-08-09 DIAGNOSIS — C8510 Unspecified B-cell lymphoma, unspecified site: Secondary | ICD-10-CM

## 2011-08-09 DIAGNOSIS — C8588 Other specified types of non-Hodgkin lymphoma, lymph nodes of multiple sites: Secondary | ICD-10-CM

## 2011-08-09 MED ORDER — FILGRASTIM 480 MCG/0.8ML IJ SOLN
480.0000 ug | Freq: Once | INTRAMUSCULAR | Status: AC
  Administered 2011-08-09: 480 ug via SUBCUTANEOUS
  Filled 2011-08-09: qty 0.8

## 2011-08-10 ENCOUNTER — Ambulatory Visit (HOSPITAL_BASED_OUTPATIENT_CLINIC_OR_DEPARTMENT_OTHER): Payer: Medicare Other

## 2011-08-10 VITALS — BP 134/76 | HR 90 | Temp 97.9°F

## 2011-08-10 DIAGNOSIS — C8510 Unspecified B-cell lymphoma, unspecified site: Secondary | ICD-10-CM

## 2011-08-10 DIAGNOSIS — C8588 Other specified types of non-Hodgkin lymphoma, lymph nodes of multiple sites: Secondary | ICD-10-CM

## 2011-08-10 MED ORDER — FILGRASTIM 480 MCG/0.8ML IJ SOLN
480.0000 ug | Freq: Once | INTRAMUSCULAR | Status: AC
  Administered 2011-08-10: 480 ug via SUBCUTANEOUS
  Filled 2011-08-10: qty 0.8

## 2011-08-11 ENCOUNTER — Ambulatory Visit (HOSPITAL_BASED_OUTPATIENT_CLINIC_OR_DEPARTMENT_OTHER): Payer: Medicare Other

## 2011-08-11 VITALS — BP 142/87 | HR 84 | Temp 97.6°F

## 2011-08-11 DIAGNOSIS — C8510 Unspecified B-cell lymphoma, unspecified site: Secondary | ICD-10-CM

## 2011-08-11 DIAGNOSIS — C8588 Other specified types of non-Hodgkin lymphoma, lymph nodes of multiple sites: Secondary | ICD-10-CM

## 2011-08-11 MED ORDER — FILGRASTIM 480 MCG/0.8ML IJ SOLN
480.0000 ug | Freq: Once | INTRAMUSCULAR | Status: AC
  Administered 2011-08-11: 480 ug via SUBCUTANEOUS

## 2011-08-13 ENCOUNTER — Ambulatory Visit (HOSPITAL_BASED_OUTPATIENT_CLINIC_OR_DEPARTMENT_OTHER): Payer: Medicare Other

## 2011-08-13 VITALS — BP 164/85 | HR 77 | Temp 98.2°F

## 2011-08-13 DIAGNOSIS — C8588 Other specified types of non-Hodgkin lymphoma, lymph nodes of multiple sites: Secondary | ICD-10-CM

## 2011-08-13 DIAGNOSIS — C8510 Unspecified B-cell lymphoma, unspecified site: Secondary | ICD-10-CM

## 2011-08-13 MED ORDER — FILGRASTIM 480 MCG/0.8ML IJ SOLN
480.0000 ug | Freq: Once | INTRAMUSCULAR | Status: AC
  Administered 2011-08-13: 480 ug via SUBCUTANEOUS
  Filled 2011-08-13: qty 0.8

## 2011-08-14 ENCOUNTER — Ambulatory Visit (HOSPITAL_BASED_OUTPATIENT_CLINIC_OR_DEPARTMENT_OTHER): Payer: Medicare Other

## 2011-08-14 VITALS — BP 156/84 | HR 78 | Temp 98.9°F

## 2011-08-14 DIAGNOSIS — C8510 Unspecified B-cell lymphoma, unspecified site: Secondary | ICD-10-CM

## 2011-08-14 DIAGNOSIS — C8588 Other specified types of non-Hodgkin lymphoma, lymph nodes of multiple sites: Secondary | ICD-10-CM

## 2011-08-14 MED ORDER — FILGRASTIM 480 MCG/0.8ML IJ SOLN
480.0000 ug | Freq: Once | INTRAMUSCULAR | Status: AC
  Administered 2011-08-14: 480 ug via SUBCUTANEOUS
  Filled 2011-08-14: qty 0.8

## 2011-08-15 ENCOUNTER — Ambulatory Visit (HOSPITAL_BASED_OUTPATIENT_CLINIC_OR_DEPARTMENT_OTHER): Payer: Medicare Other

## 2011-08-15 ENCOUNTER — Other Ambulatory Visit: Payer: Self-pay | Admitting: Oncology

## 2011-08-15 ENCOUNTER — Other Ambulatory Visit: Payer: Self-pay | Admitting: *Deleted

## 2011-08-15 ENCOUNTER — Ambulatory Visit: Payer: Medicare Other

## 2011-08-15 VITALS — BP 139/79 | HR 102 | Temp 98.7°F

## 2011-08-15 DIAGNOSIS — C8581 Other specified types of non-Hodgkin lymphoma, lymph nodes of head, face, and neck: Secondary | ICD-10-CM

## 2011-08-15 DIAGNOSIS — D702 Other drug-induced agranulocytosis: Secondary | ICD-10-CM

## 2011-08-15 DIAGNOSIS — C8510 Unspecified B-cell lymphoma, unspecified site: Secondary | ICD-10-CM

## 2011-08-15 DIAGNOSIS — C8379 Burkitt lymphoma, extranodal and solid organ sites: Secondary | ICD-10-CM

## 2011-08-15 MED ORDER — FILGRASTIM 480 MCG/0.8ML IJ SOLN
480.0000 ug | Freq: Once | INTRAMUSCULAR | Status: DC
  Filled 2011-08-15: qty 0.8

## 2011-08-15 MED ORDER — FILGRASTIM 480 MCG/0.8ML IJ SOLN
480.0000 ug | Freq: Once | INTRAMUSCULAR | Status: AC
  Administered 2011-08-15: 480 ug via SUBCUTANEOUS
  Filled 2011-08-15: qty 0.8

## 2011-08-28 ENCOUNTER — Other Ambulatory Visit: Payer: Self-pay | Admitting: Hematology and Oncology

## 2011-08-29 ENCOUNTER — Other Ambulatory Visit: Payer: Medicare Other | Admitting: Lab

## 2011-08-29 ENCOUNTER — Telehealth: Payer: Self-pay | Admitting: Hematology and Oncology

## 2011-08-29 ENCOUNTER — Ambulatory Visit (HOSPITAL_BASED_OUTPATIENT_CLINIC_OR_DEPARTMENT_OTHER): Payer: Medicare Other

## 2011-08-29 ENCOUNTER — Ambulatory Visit: Payer: Medicare Other | Admitting: Hematology and Oncology

## 2011-08-29 VITALS — BP 129/83 | HR 71 | Temp 98.7°F | Ht 63.0 in | Wt 205.0 lb

## 2011-08-29 VITALS — BP 162/89 | HR 78 | Temp 97.2°F | Ht 63.5 in | Wt 205.5 lb

## 2011-08-29 DIAGNOSIS — T451X5A Adverse effect of antineoplastic and immunosuppressive drugs, initial encounter: Secondary | ICD-10-CM

## 2011-08-29 DIAGNOSIS — C8379 Burkitt lymphoma, extranodal and solid organ sites: Secondary | ICD-10-CM | POA: Diagnosis not present

## 2011-08-29 DIAGNOSIS — G589 Mononeuropathy, unspecified: Secondary | ICD-10-CM

## 2011-08-29 DIAGNOSIS — Z5111 Encounter for antineoplastic chemotherapy: Secondary | ICD-10-CM

## 2011-08-29 DIAGNOSIS — J329 Chronic sinusitis, unspecified: Secondary | ICD-10-CM | POA: Diagnosis not present

## 2011-08-29 DIAGNOSIS — C8510 Unspecified B-cell lymphoma, unspecified site: Secondary | ICD-10-CM

## 2011-08-29 LAB — CBC WITH DIFFERENTIAL/PLATELET
Basophils Absolute: 0 10*3/uL (ref 0.0–0.1)
Eosinophils Absolute: 0 10*3/uL (ref 0.0–0.5)
HGB: 11.3 g/dL — ABNORMAL LOW (ref 11.6–15.9)
LYMPH%: 5.4 % — ABNORMAL LOW (ref 14.0–49.7)
MCV: 93.1 fL (ref 79.5–101.0)
MONO#: 0.1 10*3/uL (ref 0.1–0.9)
MONO%: 1.6 % (ref 0.0–14.0)
NEUT#: 5.4 10*3/uL (ref 1.5–6.5)
Platelets: 382 10*3/uL (ref 145–400)
RDW: 20.4 % — ABNORMAL HIGH (ref 11.2–14.5)

## 2011-08-29 LAB — COMPREHENSIVE METABOLIC PANEL
Albumin: 4.5 g/dL (ref 3.5–5.2)
Alkaline Phosphatase: 66 U/L (ref 39–117)
BUN: 14 mg/dL (ref 6–23)
CO2: 24 mEq/L (ref 19–32)
Glucose, Bld: 130 mg/dL — ABNORMAL HIGH (ref 70–99)
Potassium: 5 mEq/L (ref 3.5–5.3)

## 2011-08-29 MED ORDER — SODIUM CHLORIDE 0.9 % IV SOLN: 375.0000 mg/m2 | Freq: Once | INTRAVENOUS | Status: DC

## 2011-08-29 MED ORDER — SODIUM CHLORIDE 0.9 % IV SOLN
375.0000 mg/m2 | Freq: Once | INTRAVENOUS | Status: AC
  Administered 2011-08-29: 800 mg via INTRAVENOUS
  Filled 2011-08-29: qty 80

## 2011-08-29 MED ORDER — DEXAMETHASONE SODIUM PHOSPHATE 4 MG/ML IJ SOLN
20.0000 mg | Freq: Once | INTRAMUSCULAR | Status: AC
  Administered 2011-08-29: 20 mg via INTRAVENOUS

## 2011-08-29 MED ORDER — DOXORUBICIN HCL CHEMO IV INJECTION 2 MG/ML
50.0000 mg/m2 | Freq: Once | INTRAVENOUS | Status: AC
  Administered 2011-08-29: 102 mg via INTRAVENOUS
  Filled 2011-08-29: qty 51

## 2011-08-29 MED ORDER — HEPARIN SOD (PORK) LOCK FLUSH 100 UNIT/ML IV SOLN
500.0000 [IU] | Freq: Once | INTRAVENOUS | Status: AC | PRN
  Administered 2011-08-29: 500 [IU]
  Filled 2011-08-29: qty 5

## 2011-08-29 MED ORDER — SODIUM CHLORIDE 0.9 % IV SOLN
Freq: Once | INTRAVENOUS | Status: AC
  Administered 2011-08-29: 14:00:00 via INTRAVENOUS

## 2011-08-29 MED ORDER — SODIUM CHLORIDE 0.9 % IV SOLN
750.0000 mg/m2 | Freq: Once | INTRAVENOUS | Status: AC
  Administered 2011-08-29: 1540 mg via INTRAVENOUS
  Filled 2011-08-29: qty 77

## 2011-08-29 MED ORDER — DIPHENHYDRAMINE HCL 25 MG PO CAPS
50.0000 mg | ORAL_CAPSULE | Freq: Once | ORAL | Status: AC
  Administered 2011-08-29: 50 mg via ORAL

## 2011-08-29 MED ORDER — VINCRISTINE SULFATE CHEMO INJECTION 1 MG/ML
2.0000 mg | Freq: Once | INTRAVENOUS | Status: AC
  Administered 2011-08-29: 2 mg via INTRAVENOUS
  Filled 2011-08-29: qty 2

## 2011-08-29 MED ORDER — SODIUM CHLORIDE 0.9 % IJ SOLN
10.0000 mL | INTRAMUSCULAR | Status: DC | PRN
Start: 2011-08-29 — End: 2011-08-29
  Administered 2011-08-29: 10 mL
  Filled 2011-08-29: qty 10

## 2011-08-29 MED ORDER — ACETAMINOPHEN 325 MG PO TABS
650.0000 mg | ORAL_TABLET | Freq: Once | ORAL | Status: AC
  Administered 2011-08-29: 650 mg via ORAL

## 2011-08-29 MED ORDER — ONDANSETRON 16 MG/50ML IVPB (CHCC)
16.0000 mg | Freq: Once | INTRAVENOUS | Status: AC
  Administered 2011-08-29: 16 mg via INTRAVENOUS

## 2011-08-29 NOTE — Progress Notes (Signed)
1550: Rituxan started at 174mls/hr X . 1620: VSS, rate increased to 200/hr X 200. Pt tolerating well.

## 2011-08-29 NOTE — Progress Notes (Signed)
CC:   Stan Head. Cleta Alberts, M.D. Ollen Gross, M.D. Louis Eliacin  IDENTIFYING STATEMENT:  The patient is a 71 year old woman with large B- cell non-Hodgkin's lymphoma who presents for followup.  INTERVAL HISTORY:  Mrs. Boehm has complaints of a sinus infection.  She has a postnasal drip.  She has a cough, especially at nighttime.  She has remained afebrile.  She has good energy levels.  She is without nausea, vomiting, abdominal pain, or diarrhea.  She does note the beginning of neuropathy in the tips of her fingers but is able to perform fine motor movements.  MEDICATIONS:  Reviewed and updated.  ALLERGIES:  None.  PAST MEDICAL HISTORY:  Unchanged.  FAMILY HISTORY:  Unchanged.  SOCIAL HISTORY:  Unchanged.  REVIEW OF SYSTEMS:  As above.  The rest review of systems is negative.  PHYSICAL EXAMINATION:  General Appearance:  The patient is a well- appearing, well-nourished woman in no distress.  Vital Signs:  Pulse 78. Blood pressure 160/89.  Temperature 97.2.  Respirations 20.  Weight is 205 pounds.  HEENT:  Head is atraumatic, normocephalic.  Sclerae are anicteric.  Mouth is moist.  Chest:  Clear. Port with no signs infection.  CVS1:  First and second heart sounds are present.  No added sounds or murmurs.  Abdomen:  Soft, nontender.  Bowel sounds present.  Extremities:  No edema.  No calf tenderness.  CNS: Nonfocal.  Lymph Nodes:  No adenopathy.  LABORATORY DATA:  On 08/29/2011, white cell count 5.9, hemoglobin 11.3, hematocrit 32.3, platelets 382.  CMET is pending.  IMPRESSION AND PLAN:  Mrs. Yusuf is a 71 year old woman with stage II large B-cell non-Hodgkin's lymphoma diagnosed on 05/04/2011 following excisional biopsy of right posterior cervical lymph node.  She began chemotherapy with R-CHOP in 05/2011 and has received 4 cycles.  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  Thus, we will be adjusting dose.  Her labs  are Adequate, so she will proceed for cycle 5.  She will also receive Neupogen dosed at 480 mg for 6 days.  She was given a prescription for a Z-Pak for the sinus infection.  She follows up prior to her 6th on final cycle in 3 weeks' time.  Spent more than half the time coordinating care.    ______________________________ Laurice Record, M.D. LIO/MEDQ  D:  08/29/2011  T:  08/29/2011  Job:  [id number]

## 2011-08-29 NOTE — Progress Notes (Signed)
This office note has been dictated.

## 2011-08-29 NOTE — Telephone Encounter (Signed)
appt made with Kelly Ray for 1/22 and injs made for 124-1/30,printed for pt/spouse   aom

## 2011-08-30 ENCOUNTER — Other Ambulatory Visit: Payer: Self-pay | Admitting: Nurse Practitioner

## 2011-08-30 ENCOUNTER — Ambulatory Visit (HOSPITAL_BASED_OUTPATIENT_CLINIC_OR_DEPARTMENT_OTHER): Payer: Medicare Other

## 2011-08-30 VITALS — BP 170/95 | HR 87 | Temp 98.5°F

## 2011-08-30 DIAGNOSIS — C8588 Other specified types of non-Hodgkin lymphoma, lymph nodes of multiple sites: Secondary | ICD-10-CM

## 2011-08-30 DIAGNOSIS — C8379 Burkitt lymphoma, extranodal and solid organ sites: Secondary | ICD-10-CM

## 2011-08-30 DIAGNOSIS — C8510 Unspecified B-cell lymphoma, unspecified site: Secondary | ICD-10-CM

## 2011-08-30 MED ORDER — FILGRASTIM 480 MCG/0.8ML IJ SOLN
480.0000 ug | Freq: Once | INTRAMUSCULAR | Status: AC
  Administered 2011-08-30: 480 ug via SUBCUTANEOUS
  Filled 2011-08-30: qty 0.8

## 2011-08-30 MED ORDER — AZITHROMYCIN 250 MG PO TABS: ORAL_TABLET | ORAL | Status: DC

## 2011-08-31 ENCOUNTER — Ambulatory Visit (HOSPITAL_BASED_OUTPATIENT_CLINIC_OR_DEPARTMENT_OTHER): Payer: Medicare Other

## 2011-08-31 VITALS — BP 134/90 | HR 83 | Temp 97.3°F

## 2011-08-31 DIAGNOSIS — C8379 Burkitt lymphoma, extranodal and solid organ sites: Secondary | ICD-10-CM

## 2011-08-31 DIAGNOSIS — C8510 Unspecified B-cell lymphoma, unspecified site: Secondary | ICD-10-CM

## 2011-08-31 MED ORDER — FILGRASTIM 480 MCG/0.8ML IJ SOLN
480.0000 ug | Freq: Once | INTRAMUSCULAR | Status: AC
  Administered 2011-08-31: 480 ug via SUBCUTANEOUS
  Filled 2011-08-31: qty 0.8

## 2011-09-01 ENCOUNTER — Ambulatory Visit (HOSPITAL_BASED_OUTPATIENT_CLINIC_OR_DEPARTMENT_OTHER): Payer: Medicare Other

## 2011-09-01 VITALS — BP 149/80 | HR 92 | Temp 98.4°F

## 2011-09-01 DIAGNOSIS — C8510 Unspecified B-cell lymphoma, unspecified site: Secondary | ICD-10-CM

## 2011-09-01 DIAGNOSIS — C8581 Other specified types of non-Hodgkin lymphoma, lymph nodes of head, face, and neck: Secondary | ICD-10-CM | POA: Diagnosis not present

## 2011-09-01 MED ORDER — FILGRASTIM 480 MCG/0.8ML IJ SOLN
480.0000 ug | Freq: Once | INTRAMUSCULAR | Status: AC
  Administered 2011-09-01: 480 ug via SUBCUTANEOUS

## 2011-09-03 ENCOUNTER — Ambulatory Visit (HOSPITAL_BASED_OUTPATIENT_CLINIC_OR_DEPARTMENT_OTHER): Payer: Medicare Other

## 2011-09-03 VITALS — BP 155/85 | HR 84 | Temp 98.7°F

## 2011-09-03 DIAGNOSIS — C8581 Other specified types of non-Hodgkin lymphoma, lymph nodes of head, face, and neck: Secondary | ICD-10-CM | POA: Diagnosis not present

## 2011-09-03 DIAGNOSIS — C8510 Unspecified B-cell lymphoma, unspecified site: Secondary | ICD-10-CM

## 2011-09-03 MED ORDER — FILGRASTIM 480 MCG/0.8ML IJ SOLN
480.0000 ug | Freq: Once | INTRAMUSCULAR | Status: AC
  Administered 2011-09-03: 480 ug via SUBCUTANEOUS
  Filled 2011-09-03: qty 0.8

## 2011-09-04 ENCOUNTER — Ambulatory Visit (HOSPITAL_BASED_OUTPATIENT_CLINIC_OR_DEPARTMENT_OTHER): Payer: Medicare Other

## 2011-09-04 VITALS — BP 152/91 | HR 85 | Temp 98.0°F

## 2011-09-04 DIAGNOSIS — C8379 Burkitt lymphoma, extranodal and solid organ sites: Secondary | ICD-10-CM

## 2011-09-04 DIAGNOSIS — C8510 Unspecified B-cell lymphoma, unspecified site: Secondary | ICD-10-CM

## 2011-09-04 MED ORDER — FILGRASTIM 480 MCG/0.8ML IJ SOLN
480.0000 ug | Freq: Once | INTRAMUSCULAR | Status: AC
  Administered 2011-09-04: 480 ug via SUBCUTANEOUS
  Filled 2011-09-04: qty 0.8

## 2011-09-05 ENCOUNTER — Ambulatory Visit (HOSPITAL_BASED_OUTPATIENT_CLINIC_OR_DEPARTMENT_OTHER): Payer: Medicare Other

## 2011-09-05 VITALS — BP 138/86 | HR 89 | Temp 98.6°F

## 2011-09-05 DIAGNOSIS — C8581 Other specified types of non-Hodgkin lymphoma, lymph nodes of head, face, and neck: Secondary | ICD-10-CM

## 2011-09-05 DIAGNOSIS — C8510 Unspecified B-cell lymphoma, unspecified site: Secondary | ICD-10-CM

## 2011-09-05 MED ORDER — FILGRASTIM 480 MCG/0.8ML IJ SOLN
480.0000 ug | Freq: Once | INTRAMUSCULAR | Status: AC
  Administered 2011-09-05: 480 ug via SUBCUTANEOUS
  Filled 2011-09-05: qty 0.8

## 2011-09-10 ENCOUNTER — Ambulatory Visit (INDEPENDENT_AMBULATORY_CARE_PROVIDER_SITE_OTHER): Payer: Medicare Other

## 2011-09-10 DIAGNOSIS — J40 Bronchitis, not specified as acute or chronic: Secondary | ICD-10-CM

## 2011-09-10 DIAGNOSIS — R05 Cough: Secondary | ICD-10-CM

## 2011-09-14 ENCOUNTER — Other Ambulatory Visit: Payer: Self-pay | Admitting: *Deleted

## 2011-09-18 ENCOUNTER — Ambulatory Visit: Payer: Medicare Other | Admitting: Physician Assistant

## 2011-09-18 ENCOUNTER — Other Ambulatory Visit: Payer: Self-pay | Admitting: Hematology and Oncology

## 2011-09-19 ENCOUNTER — Ambulatory Visit (HOSPITAL_BASED_OUTPATIENT_CLINIC_OR_DEPARTMENT_OTHER): Payer: Medicare Other | Admitting: Physician Assistant

## 2011-09-19 ENCOUNTER — Telehealth: Payer: Self-pay | Admitting: Hematology and Oncology

## 2011-09-19 ENCOUNTER — Other Ambulatory Visit (HOSPITAL_BASED_OUTPATIENT_CLINIC_OR_DEPARTMENT_OTHER): Payer: Medicare Other | Admitting: Lab

## 2011-09-19 ENCOUNTER — Other Ambulatory Visit: Payer: Self-pay | Admitting: Physician Assistant

## 2011-09-19 ENCOUNTER — Encounter: Payer: Self-pay | Admitting: Physician Assistant

## 2011-09-19 ENCOUNTER — Ambulatory Visit (HOSPITAL_BASED_OUTPATIENT_CLINIC_OR_DEPARTMENT_OTHER): Payer: Medicare Other

## 2011-09-19 VITALS — BP 118/79 | HR 82 | Temp 97.5°F

## 2011-09-19 VITALS — BP 144/79 | HR 90 | Temp 98.2°F | Ht 63.0 in | Wt 202.1 lb

## 2011-09-19 DIAGNOSIS — C8589 Other specified types of non-Hodgkin lymphoma, extranodal and solid organ sites: Secondary | ICD-10-CM | POA: Diagnosis not present

## 2011-09-19 DIAGNOSIS — G589 Mononeuropathy, unspecified: Secondary | ICD-10-CM

## 2011-09-19 DIAGNOSIS — C8379 Burkitt lymphoma, extranodal and solid organ sites: Secondary | ICD-10-CM

## 2011-09-19 DIAGNOSIS — Z7969 Long term (current) use of other immunomodulators and immunosuppressants: Secondary | ICD-10-CM

## 2011-09-19 DIAGNOSIS — Z5111 Encounter for antineoplastic chemotherapy: Secondary | ICD-10-CM

## 2011-09-19 DIAGNOSIS — C8510 Unspecified B-cell lymphoma, unspecified site: Secondary | ICD-10-CM

## 2011-09-19 DIAGNOSIS — Z79899 Other long term (current) drug therapy: Secondary | ICD-10-CM

## 2011-09-19 LAB — CBC WITH DIFFERENTIAL/PLATELET
BASO%: 0.3 % (ref 0.0–2.0)
EOS%: 0.2 % (ref 0.0–7.0)
MCH: 32.5 pg (ref 25.1–34.0)
MCHC: 34 g/dL (ref 31.5–36.0)
MONO#: 0.4 10*3/uL (ref 0.1–0.9)
RBC: 3.41 10*6/uL — ABNORMAL LOW (ref 3.70–5.45)
RDW: 18.7 % — ABNORMAL HIGH (ref 11.2–14.5)
WBC: 3.2 10*3/uL — ABNORMAL LOW (ref 3.9–10.3)
lymph#: 0.6 10*3/uL — ABNORMAL LOW (ref 0.9–3.3)

## 2011-09-19 LAB — COMPREHENSIVE METABOLIC PANEL
ALT: 23 U/L (ref 0–35)
AST: 28 U/L (ref 0–37)
CO2: 23 mEq/L (ref 19–32)
Calcium: 8.9 mg/dL (ref 8.4–10.5)
Chloride: 105 mEq/L (ref 96–112)
Creatinine, Ser: 0.56 mg/dL (ref 0.50–1.10)
Sodium: 140 mEq/L (ref 135–145)
Total Protein: 6 g/dL (ref 6.0–8.3)

## 2011-09-19 MED ORDER — SODIUM CHLORIDE 0.9 % IV SOLN
750.0000 mg/m2 | Freq: Once | INTRAVENOUS | Status: AC
  Administered 2011-09-19: 1540 mg via INTRAVENOUS
  Filled 2011-09-19: qty 77

## 2011-09-19 MED ORDER — DIPHENHYDRAMINE HCL 25 MG PO CAPS
50.0000 mg | ORAL_CAPSULE | Freq: Once | ORAL | Status: AC
  Administered 2011-09-19: 50 mg via ORAL

## 2011-09-19 MED ORDER — SODIUM CHLORIDE 0.9 % IJ SOLN
10.0000 mL | INTRAMUSCULAR | Status: DC | PRN
  Administered 2011-09-19: 10 mL
  Filled 2011-09-19: qty 10

## 2011-09-19 MED ORDER — ONDANSETRON 16 MG/50ML IVPB (CHCC)
16.0000 mg | Freq: Once | INTRAVENOUS | Status: AC
  Administered 2011-09-19: 16 mg via INTRAVENOUS

## 2011-09-19 MED ORDER — DEXAMETHASONE SODIUM PHOSPHATE 4 MG/ML IJ SOLN
20.0000 mg | Freq: Once | INTRAMUSCULAR | Status: AC
  Administered 2011-09-19: 20 mg via INTRAVENOUS

## 2011-09-19 MED ORDER — ACETAMINOPHEN 325 MG PO TABS
650.0000 mg | ORAL_TABLET | Freq: Once | ORAL | Status: AC
  Administered 2011-09-19: 650 mg via ORAL

## 2011-09-19 MED ORDER — SODIUM CHLORIDE 0.9 % IV SOLN: 375.0000 mg/m2 | Freq: Once | INTRAVENOUS | Status: DC

## 2011-09-19 MED ORDER — ONDANSETRON HCL 8 MG PO TABS: 8.0000 mg | ORAL_TABLET | Freq: Two times a day (BID) | ORAL | Status: DC | PRN

## 2011-09-19 MED ORDER — HEPARIN SOD (PORK) LOCK FLUSH 100 UNIT/ML IV SOLN
500.0000 [IU] | Freq: Once | INTRAVENOUS | Status: AC | PRN
  Administered 2011-09-19: 500 [IU]
  Filled 2011-09-19: qty 5

## 2011-09-19 MED ORDER — SODIUM CHLORIDE 0.9 % IV SOLN
375.0000 mg/m2 | Freq: Once | INTRAVENOUS | Status: AC
  Administered 2011-09-19: 800 mg via INTRAVENOUS
  Filled 2011-09-19: qty 80

## 2011-09-19 MED ORDER — SODIUM CHLORIDE 0.9 % IV SOLN
Freq: Once | INTRAVENOUS | Status: AC
  Administered 2011-09-19: 10:00:00 via INTRAVENOUS

## 2011-09-19 MED ORDER — DOXORUBICIN HCL CHEMO IV INJECTION 2 MG/ML
50.0000 mg/m2 | Freq: Once | INTRAVENOUS | Status: AC
  Administered 2011-09-19: 102 mg via INTRAVENOUS
  Filled 2011-09-19: qty 51

## 2011-09-19 MED ORDER — VINCRISTINE SULFATE CHEMO INJECTION 1 MG/ML
2.0000 mg | Freq: Once | INTRAVENOUS | Status: AC
  Administered 2011-09-19: 2 mg via INTRAVENOUS
  Filled 2011-09-19: qty 2

## 2011-09-19 NOTE — Progress Notes (Signed)
CC: Kelly Kelly Ray. Kelly Kelly Ray, M.D.  Kelly Kelly Ray, M.D.  Kelly Kelly Ray   IDENTIFYING STATEMENT: The patient is a 71 year old woman with large B-cell non-Hodgkin's lymphoma who presents for followup prior to her 6th and final cycle of chemotherapy with R-CHOP, with Neupogen support.   INTERVAL HISTORY: Pt is recovering from sinus infection, requiring Z pack beginning on 08/29/10. After the completion of the regimen, since her symptoms were still present,her PCP added Cefdinir 300 mg bid and Tessalon Perles 100 mg tid  with resolution of symptoms.  In addition, she reports a CXR was performed showing no acute disease. She has remained afebrile. She has good energy levels. She is without nausea, vomiting, abdominal pain, or diarrhea. She does note the beginning of neuropathy in the tips of her fingers but is able to perform fine motor movements and ADLs.   PHYSICAL EXAM  Vital signs  Wt 202.1. BP: 144/78, P 90, R 20, T 98.2  General Appearance: The patient is a well- appearing, well-nourished woman in no distress.   HEENT: Kelly Ray is atraumatic, normocephalic. Alopecia. Sclerae are anicteric. Mouth is moist. Chest: Clear. Port with no signs infection. CVS1: First and second heart sounds are present. No added sounds or murmurs. Abdomen: Soft, nontender. Bowel sounds present. Extremities: No edema. No calf tenderness. CNS: Nonfocal. Lymph Nodes: No adenopathy.      LABS:   CBC   Lab 09/19/11 0754  WBC 3.2*  HGB 11.1*  HCT 32.6*  PLT 309  MCV 95.6  MCH 32.5  MCHC 34.0  RDW 18.7*  LYMPHSABS 0.6*  MONOABS 0.4  EOSABS 0.0  BASOSABS 0.0  BANDABS --    CMP - pending.    ASSESSMENT and PLAN:  Kelly Kelly Ray is a 71 year old woman with stage II large B-cell non-Hodgkin's lymphoma diagnosed on 05/04/2011 following excisional biopsy of right posterior cervical lymph node. She began chemotherapy with R-CHOP in 05/2011 and has received 4 cycles, last on 08/29/11. 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 Thus, dose was adjusted for Cycle 5. Her labs areadequate, so she will proceed for her final therapy, cycle 6. She will also receive Neupogen dosed at 480 mg for 6 days beginning on 1/24. Pt to be given a refill of Zofran today, since she has only one pill left. Pt to follow up with Dr. Dalene Carrow on 10/16/2011 at 3:15 with labs and staging scans taken prior to her visit, to evaluate response to therapy.    Dr. Dalene Carrow spent more than half the time coordinating care.    Kelly Eke, MD 09/19/2011   I have seen and examined the patient and agree with above.  Laurice Record., M.D.

## 2011-09-19 NOTE — Telephone Encounter (Signed)
Appts made and printed for 2/15 lab and pet and 2/19 dr lo    aom

## 2011-09-19 NOTE — Patient Instructions (Signed)
1340 Pt ambulatory upon discharge with husband at side.  Pt verbalized understanding of next appt date/time.

## 2011-09-20 ENCOUNTER — Ambulatory Visit (HOSPITAL_BASED_OUTPATIENT_CLINIC_OR_DEPARTMENT_OTHER): Payer: Medicare Other

## 2011-09-20 VITALS — BP 144/77 | HR 80 | Temp 97.5°F

## 2011-09-20 DIAGNOSIS — C8510 Unspecified B-cell lymphoma, unspecified site: Secondary | ICD-10-CM

## 2011-09-20 DIAGNOSIS — C8379 Burkitt lymphoma, extranodal and solid organ sites: Secondary | ICD-10-CM | POA: Diagnosis not present

## 2011-09-20 DIAGNOSIS — Z5189 Encounter for other specified aftercare: Secondary | ICD-10-CM | POA: Diagnosis not present

## 2011-09-20 MED ORDER — FILGRASTIM 480 MCG/0.8ML IJ SOLN
480.0000 ug | Freq: Once | INTRAMUSCULAR | Status: AC
  Administered 2011-09-20: 480 ug via SUBCUTANEOUS
  Filled 2011-09-20: qty 0.8

## 2011-09-21 ENCOUNTER — Ambulatory Visit (HOSPITAL_BASED_OUTPATIENT_CLINIC_OR_DEPARTMENT_OTHER): Payer: Medicare Other

## 2011-09-21 VITALS — BP 145/77 | HR 80 | Temp 97.5°F

## 2011-09-21 DIAGNOSIS — C8581 Other specified types of non-Hodgkin lymphoma, lymph nodes of head, face, and neck: Secondary | ICD-10-CM

## 2011-09-21 DIAGNOSIS — C8510 Unspecified B-cell lymphoma, unspecified site: Secondary | ICD-10-CM

## 2011-09-21 MED ORDER — FILGRASTIM 480 MCG/0.8ML IJ SOLN
480.0000 ug | Freq: Once | INTRAMUSCULAR | Status: AC
  Administered 2011-09-21: 480 ug via SUBCUTANEOUS
  Filled 2011-09-21: qty 0.8

## 2011-09-22 ENCOUNTER — Ambulatory Visit (HOSPITAL_BASED_OUTPATIENT_CLINIC_OR_DEPARTMENT_OTHER): Payer: Medicare Other

## 2011-09-22 VITALS — BP 161/84 | HR 77 | Temp 98.0°F

## 2011-09-22 DIAGNOSIS — C8510 Unspecified B-cell lymphoma, unspecified site: Secondary | ICD-10-CM

## 2011-09-22 DIAGNOSIS — C8379 Burkitt lymphoma, extranodal and solid organ sites: Secondary | ICD-10-CM

## 2011-09-22 MED ORDER — FILGRASTIM 480 MCG/0.8ML IJ SOLN
480.0000 ug | Freq: Once | INTRAMUSCULAR | Status: AC
  Administered 2011-09-22: 480 ug via SUBCUTANEOUS

## 2011-09-24 ENCOUNTER — Ambulatory Visit (HOSPITAL_BASED_OUTPATIENT_CLINIC_OR_DEPARTMENT_OTHER): Payer: Medicare Other

## 2011-09-24 ENCOUNTER — Ambulatory Visit: Payer: Medicare Other

## 2011-09-24 VITALS — BP 145/81 | HR 85 | Temp 97.7°F

## 2011-09-24 DIAGNOSIS — C8510 Unspecified B-cell lymphoma, unspecified site: Secondary | ICD-10-CM

## 2011-09-24 DIAGNOSIS — Z5189 Encounter for other specified aftercare: Secondary | ICD-10-CM | POA: Diagnosis not present

## 2011-09-24 DIAGNOSIS — C8379 Burkitt lymphoma, extranodal and solid organ sites: Secondary | ICD-10-CM

## 2011-09-24 MED ORDER — FILGRASTIM 480 MCG/0.8ML IJ SOLN
480.0000 ug | Freq: Once | INTRAMUSCULAR | Status: AC
  Administered 2011-09-24: 480 ug via SUBCUTANEOUS
  Filled 2011-09-24: qty 0.8

## 2011-09-25 ENCOUNTER — Ambulatory Visit (HOSPITAL_BASED_OUTPATIENT_CLINIC_OR_DEPARTMENT_OTHER): Payer: Medicare Other

## 2011-09-25 VITALS — BP 134/83 | HR 88 | Temp 98.7°F

## 2011-09-25 DIAGNOSIS — Z5111 Encounter for antineoplastic chemotherapy: Secondary | ICD-10-CM

## 2011-09-25 DIAGNOSIS — C8379 Burkitt lymphoma, extranodal and solid organ sites: Secondary | ICD-10-CM

## 2011-09-25 DIAGNOSIS — C8510 Unspecified B-cell lymphoma, unspecified site: Secondary | ICD-10-CM

## 2011-09-25 MED ORDER — FILGRASTIM 480 MCG/0.8ML IJ SOLN
480.0000 ug | Freq: Once | INTRAMUSCULAR | Status: AC
  Administered 2011-09-25: 480 ug via SUBCUTANEOUS
  Filled 2011-09-25: qty 0.8

## 2011-09-26 ENCOUNTER — Ambulatory Visit (HOSPITAL_BASED_OUTPATIENT_CLINIC_OR_DEPARTMENT_OTHER): Payer: Medicare Other

## 2011-09-26 VITALS — BP 119/70 | HR 93 | Temp 98.4°F

## 2011-09-26 DIAGNOSIS — C8510 Unspecified B-cell lymphoma, unspecified site: Secondary | ICD-10-CM

## 2011-09-26 DIAGNOSIS — C8581 Other specified types of non-Hodgkin lymphoma, lymph nodes of head, face, and neck: Secondary | ICD-10-CM

## 2011-09-26 MED ORDER — FILGRASTIM 480 MCG/0.8ML IJ SOLN
480.0000 ug | Freq: Once | INTRAMUSCULAR | Status: AC
  Administered 2011-09-26: 480 ug via SUBCUTANEOUS
  Filled 2011-09-26: qty 0.8

## 2011-09-28 ENCOUNTER — Telehealth: Payer: Self-pay | Admitting: Nurse Practitioner

## 2011-09-28 NOTE — Telephone Encounter (Signed)
Patient called c/o dryness and irritation in vaginal and rectal area.  Inquired if reaction to chemo.  Pt denies discharge or open sores.  RN informed pt is not usual side effect of chemotherapy, however, chemo is known to irritate mucous membranes.  Recommended pt utilize vaginal lubricant such as KY jelly for vaginal area and aquaphor for rectal area.  Suggested pt followup with OB/GYN or primary MD if not relieved with topical lubricants.  Informed pt this information will be given to Dr. Dalene Carrow and RN will call if further instructions received.

## 2011-10-01 ENCOUNTER — Telehealth: Payer: Self-pay | Admitting: *Deleted

## 2011-10-01 DIAGNOSIS — D4959 Neoplasm of unspecified behavior of other genitourinary organ: Secondary | ICD-10-CM | POA: Diagnosis not present

## 2011-10-01 DIAGNOSIS — Z113 Encounter for screening for infections with a predominantly sexual mode of transmission: Secondary | ICD-10-CM | POA: Diagnosis not present

## 2011-10-01 DIAGNOSIS — N760 Acute vaginitis: Secondary | ICD-10-CM | POA: Diagnosis not present

## 2011-10-01 DIAGNOSIS — N390 Urinary tract infection, site not specified: Secondary | ICD-10-CM | POA: Diagnosis not present

## 2011-10-01 NOTE — Telephone Encounter (Signed)
Spoke with pt today and was informed that pt saw her GYN md today.   Pt had lots of tests performed, was given steroid cream for anal area, several meds for the vaginal dryness.    Pt is still awaiting test results.    Pt inquired if she could still use any meds to help with bowels.   Informed pt that she could either use colace or senokot-s  OTC,  Eating applesauce, drink prunce juice.    Pt voiced understanding. Pt's   Phone     [phone].

## 2011-10-04 DIAGNOSIS — N760 Acute vaginitis: Secondary | ICD-10-CM | POA: Diagnosis not present

## 2011-10-12 ENCOUNTER — Encounter (HOSPITAL_COMMUNITY)
Admission: RE | Admit: 2011-10-12 | Discharge: 2011-10-12 | Disposition: A | Discharge status: Home / Self Care | Payer: Medicare Other | Source: Ambulatory Visit | Attending: Hematology and Oncology | Admitting: Hematology and Oncology

## 2011-10-12 ENCOUNTER — Telehealth: Payer: Self-pay

## 2011-10-12 ENCOUNTER — Encounter (HOSPITAL_COMMUNITY): Payer: Self-pay

## 2011-10-12 ENCOUNTER — Other Ambulatory Visit: Payer: Medicare Other | Admitting: Lab

## 2011-10-12 DIAGNOSIS — C8589 Other specified types of non-Hodgkin lymphoma, extranodal and solid organ sites: Secondary | ICD-10-CM | POA: Insufficient documentation

## 2011-10-12 DIAGNOSIS — C8590 Non-Hodgkin lymphoma, unspecified, unspecified site: Secondary | ICD-10-CM

## 2011-10-12 DIAGNOSIS — C8510 Unspecified B-cell lymphoma, unspecified site: Secondary | ICD-10-CM

## 2011-10-12 LAB — CBC WITH DIFFERENTIAL/PLATELET
EOS%: 0 % (ref 0.0–7.0)
LYMPH%: 28.1 % (ref 14.0–49.7)
MCH: 31.6 pg (ref 25.1–34.0)
MCHC: 33.1 g/dL (ref 31.5–36.0)
MCV: 95.5 fL (ref 79.5–101.0)
MONO%: 18.3 % — ABNORMAL HIGH (ref 0.0–14.0)
Platelets: 205 10*3/uL (ref 145–400)
RBC: 3.32 10*6/uL — ABNORMAL LOW (ref 3.70–5.45)
RDW: 16.3 % — ABNORMAL HIGH (ref 11.2–14.5)

## 2011-10-12 LAB — COMPREHENSIVE METABOLIC PANEL
AST: 31 U/L (ref 0–37)
Albumin: 3.9 g/dL (ref 3.5–5.2)
Alkaline Phosphatase: 66 U/L (ref 39–117)
Glucose, Bld: 106 mg/dL — ABNORMAL HIGH (ref 70–99)
Potassium: 4.3 mEq/L (ref 3.5–5.3)
Sodium: 140 mEq/L (ref 135–145)
Total Bilirubin: 0.5 mg/dL (ref 0.3–1.2)
Total Protein: 5.7 g/dL — ABNORMAL LOW (ref 6.0–8.3)

## 2011-10-12 MED ORDER — FLUDEOXYGLUCOSE F - 18 (FDG) INJECTION
17.9000 | Freq: Once | INTRAVENOUS | Status: AC | PRN
  Administered 2011-10-12: 17.9 via INTRAVENOUS

## 2011-10-12 NOTE — Telephone Encounter (Signed)
Pt states that she has developed another sinus infection and would like to have a refill on tussin pearls. [facility] [address]

## 2011-10-12 NOTE — Telephone Encounter (Signed)
Pull chart to nurses station and route message back to clinical message pool

## 2011-10-13 MED ORDER — BENZONATATE 100 MG PO CAPS: 200.0000 mg | ORAL_CAPSULE | Freq: Three times a day (TID) | ORAL | Status: AC | PRN

## 2011-10-13 NOTE — Telephone Encounter (Signed)
Called pt advised rx sent in. Pt understood

## 2011-10-13 NOTE — Telephone Encounter (Signed)
I sent in RX for [employer]

## 2011-10-13 NOTE — Telephone Encounter (Signed)
Chart at [facility] desk. Please advise

## 2011-10-16 ENCOUNTER — Ambulatory Visit (HOSPITAL_BASED_OUTPATIENT_CLINIC_OR_DEPARTMENT_OTHER): Payer: Medicare Other | Admitting: Hematology and Oncology

## 2011-10-16 ENCOUNTER — Telehealth: Payer: Self-pay | Admitting: Hematology and Oncology

## 2011-10-16 VITALS — BP 147/83 | HR 77 | Temp 97.0°F | Ht 63.0 in | Wt 196.9 lb

## 2011-10-16 DIAGNOSIS — C8590 Non-Hodgkin lymphoma, unspecified, unspecified site: Secondary | ICD-10-CM

## 2011-10-16 DIAGNOSIS — C8589 Other specified types of non-Hodgkin lymphoma, extranodal and solid organ sites: Secondary | ICD-10-CM

## 2011-10-16 NOTE — Progress Notes (Signed)
This office note has been dictated.

## 2011-10-16 NOTE — Telephone Encounter (Signed)
appts made and printed for 614 and 6/19,contrast given  aom

## 2011-10-16 NOTE — Progress Notes (Signed)
CC:   Stan Head. Cleta Alberts, M.D. Ollen Gross, M.D. [address] [place], [place], [place] [phone]  IDENTIFYING STATEMENT:  The patient is a 71 year old woman with large B- cell non-Hodgkin lymphoma, who presents for followup.  INTERVAL HISTORY:  Mrs. Dyal has completed R-CHOP.  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  And her symptoms have resolved. She is being followed by Silvestre Mesi.  She has good energy levels. She is has no nausea, vomiting, or diarrhea.  She denies adenopathy.  Reviewed recent PET scan on 10/12/2011 that showed no evidence of lymphoma in the neck, chest, abdomen, or pelvis.  MEDICATIONS:  Reviewed and updated.  ALLERGIES:  None.  PAST MEDICAL HISTORY/FAMILY HISTORY/SOCIAL HISTORY:  Unchanged.  REVIEW OF SYSTEMS:  A 10-point review of systems is negative.  PHYSICAL EXAM:  General; The patient is a well-appearing, well- nourished, woman in no distress.  Vitals: Pulse 77, blood pressure 147/83, temperature 97, respirations 20, and weight 96 pounds.  HEENT: Head is atraumatic, normocephalic.  Sclerae anicteric.  Mouth moist. Neck:  Supple.  Chest:  Clear to percussion and auscultation.  CVS: 1st and 2nd heart sounds present.  No added sounds or murmurs.  Abdomen: Soft, nontender.  No masses.  No hepatomegaly.  Bowel sounds present. Extremities:  No edema or calf tenderness.  Lymph nodes:  No palpable adenopathy.  CNS: Nonfocal.  LAB DATA:  10/12/2011, white cell count 4, hemoglobin 10.5, hematocrit 31.7, and platelets 205.  Sodium 140, potassium 4.3, chloride 106, CO2 25, BUN 12, creatinine 0.63, and glucose 106. T bilirubin 0.5, alkaline phosphatase 66, AST 31, ALT 22, and calcium is 8.5.  IMAGING DATA:  Results of PET scan as in interval history.  IMPRESSION AND PLAN:  Mrs. Zundel is a pleasant 71 year old woman with stage II, large B-cell non-Hodgkin lymphoma diagnosed on 05/04/2011 following an excisional biopsy of a right  posterior cervical lymph node. She received 6 cycles of rituximab - cyclophosphamide, doxorubicin, vincristine, and prednisone from October 2012 through February 2012. The patient's current PET scan indicates no evidence of recurrent disease.  She follows up in 4 months' time with a CT scan and blood work.  I spent more than half of the time in coordinating care.    ______________________________ Laurice Record, M.D. LIO/MEDQ  D:  10/16/2011  T:  10/16/2011  Job:  [id number]

## 2011-11-12 DIAGNOSIS — Z124 Encounter for screening for malignant neoplasm of cervix: Secondary | ICD-10-CM | POA: Diagnosis not present

## 2011-11-12 DIAGNOSIS — Z01419 Encounter for gynecological examination (general) (routine) without abnormal findings: Secondary | ICD-10-CM | POA: Diagnosis not present

## 2011-11-28 ENCOUNTER — Ambulatory Visit (INDEPENDENT_AMBULATORY_CARE_PROVIDER_SITE_OTHER): Payer: Medicare Other | Admitting: Emergency Medicine

## 2011-11-28 VITALS — BP 174/77 | HR 72 | Temp 98.4°F | Resp 16 | Ht 63.0 in | Wt 193.2 lb

## 2011-11-28 DIAGNOSIS — R7309 Other abnormal glucose: Secondary | ICD-10-CM

## 2011-11-28 DIAGNOSIS — I10 Essential (primary) hypertension: Secondary | ICD-10-CM

## 2011-11-28 DIAGNOSIS — R739 Hyperglycemia, unspecified: Secondary | ICD-10-CM

## 2011-11-28 DIAGNOSIS — C50919 Malignant neoplasm of unspecified site of unspecified female breast: Secondary | ICD-10-CM | POA: Diagnosis not present

## 2011-11-28 MED ORDER — LISINOPRIL-HYDROCHLOROTHIAZIDE 20-12.5 MG PO TABS: 1.0000 | ORAL_TABLET | Freq: Every day | ORAL | Status: DC

## 2011-11-28 NOTE — Progress Notes (Signed)
  Subjective:    Patient ID: Kelly Ray, female    DOB: [birth year], 71 y.o.   MRN: [id number]  HPI patient enters for recheck of her sugar. While she was on chemotherapy her sugar was elevated. She also had a very low blood pressure during her chemotherapy and her blood pressure medications were stopped. She has not had any chemotherapy since February and enters today since feeling like her pressure had risen again and restarting her medications    Review of Systems specifically she denies chest pain shortness of breath     Objective:   Physical Exam  Blood pressure repeated was 140/82 .      Assessment & Plan:  We'll check hemoglobin A1c. She is to resume her blood pressure medications. Refills written for her blood pressure medicines she consented [facility].

## 2012-01-02 ENCOUNTER — Ambulatory Visit: Payer: Medicare Other

## 2012-01-02 ENCOUNTER — Ambulatory Visit (INDEPENDENT_AMBULATORY_CARE_PROVIDER_SITE_OTHER): Payer: Medicare Other | Admitting: Emergency Medicine

## 2012-01-02 VITALS — BP 130/82 | HR 77 | Temp 97.6°F | Resp 18 | Ht 63.0 in | Wt 190.0 lb

## 2012-01-02 DIAGNOSIS — J309 Allergic rhinitis, unspecified: Secondary | ICD-10-CM

## 2012-01-02 DIAGNOSIS — R05 Cough: Secondary | ICD-10-CM | POA: Diagnosis not present

## 2012-01-02 DIAGNOSIS — J45909 Unspecified asthma, uncomplicated: Secondary | ICD-10-CM

## 2012-01-02 DIAGNOSIS — R059 Cough, unspecified: Secondary | ICD-10-CM

## 2012-01-02 DIAGNOSIS — Z9109 Other allergy status, other than to drugs and biological substances: Secondary | ICD-10-CM

## 2012-01-02 MED ORDER — ALBUTEROL SULFATE (2.5 MG/3ML) 0.083% IN NEBU
2.5000 mg | INHALATION_SOLUTION | Freq: Once | RESPIRATORY_TRACT | Status: AC
  Administered 2012-01-02: 2.5 mg via RESPIRATORY_TRACT

## 2012-01-02 MED ORDER — HYDROCODONE-HOMATROPINE 5-1.5 MG/5ML PO SYRP: 5.0000 mL | ORAL_SOLUTION | Freq: Four times a day (QID) | ORAL | Status: AC | PRN

## 2012-01-02 MED ORDER — ALBUTEROL SULFATE HFA 108 (90 BASE) MCG/ACT IN AERS: 2.0000 | INHALATION_SPRAY | Freq: Four times a day (QID) | RESPIRATORY_TRACT | Status: DC | PRN

## 2012-01-02 MED ORDER — PREDNISONE 20 MG PO TABS: ORAL_TABLET | ORAL | Status: DC

## 2012-01-02 NOTE — Progress Notes (Signed)
  Subjective:    Patient ID: Kelly Ray, female    DOB: 28-Oct-1940, 71 y.o.   MRN: [id number]  HPI patient presents with a four-day history of head congestion scratchy throat and wheezing in her chest. She has completed a visit up to the [facility] and when she returned she started having these symptoms. Her main complaint is of cough which is totally nonproductive.    Review of Systems patient has a history of lymphoma treated with chemotherapy.     Objective:   Physical Exam  Constitutional: She appears well-developed.  HENT:  Head: Normocephalic and atraumatic.  Eyes: Pupils are equal, round, and reactive to light.  Neck: Thyromegaly present.  Cardiovascular: Normal rate and regular rhythm.   Pulmonary/Chest: She has wheezes. She has rales.       Patient has good air movement on both sides with significant wheezing bilaterally.   Peak flow is 225 posttreatment  [facility] reading (PRIMARY) by  Dr.Chayanne Filippi chest x-ray shows no acute disease. Heart size is normal. There is a Port-A-Cath present       Assessment & Plan:    Ahead and check a chest x-ray. If chest x-ray is clear we'll treat his reactive airways disease. Chest x-ray was unremarkable ahead and treat with prednisone albuterol and cough medication

## 2012-02-08 ENCOUNTER — Telehealth: Payer: Self-pay | Admitting: Family Medicine

## 2012-02-08 ENCOUNTER — Encounter: Payer: Self-pay | Admitting: Nurse Practitioner

## 2012-02-08 ENCOUNTER — Ambulatory Visit (HOSPITAL_COMMUNITY)
Admission: RE | Admit: 2012-02-08 | Discharge: 2012-02-08 | Disposition: A | Discharge status: Home / Self Care | Payer: Medicare Other | Source: Ambulatory Visit | Attending: Hematology and Oncology | Admitting: Hematology and Oncology

## 2012-02-08 ENCOUNTER — Other Ambulatory Visit (HOSPITAL_BASED_OUTPATIENT_CLINIC_OR_DEPARTMENT_OTHER): Payer: Medicare Other

## 2012-02-08 ENCOUNTER — Telehealth: Payer: Self-pay | Admitting: Nurse Practitioner

## 2012-02-08 DIAGNOSIS — K769 Liver disease, unspecified: Secondary | ICD-10-CM | POA: Diagnosis not present

## 2012-02-08 DIAGNOSIS — J984 Other disorders of lung: Secondary | ICD-10-CM | POA: Insufficient documentation

## 2012-02-08 DIAGNOSIS — C8589 Other specified types of non-Hodgkin lymphoma, extranodal and solid organ sites: Secondary | ICD-10-CM

## 2012-02-08 DIAGNOSIS — I2510 Atherosclerotic heart disease of native coronary artery without angina pectoris: Secondary | ICD-10-CM | POA: Diagnosis not present

## 2012-02-08 DIAGNOSIS — C8590 Non-Hodgkin lymphoma, unspecified, unspecified site: Secondary | ICD-10-CM

## 2012-02-08 DIAGNOSIS — I700 Atherosclerosis of aorta: Secondary | ICD-10-CM | POA: Insufficient documentation

## 2012-02-08 DIAGNOSIS — Z09 Encounter for follow-up examination after completed treatment for conditions other than malignant neoplasm: Secondary | ICD-10-CM | POA: Diagnosis not present

## 2012-02-08 DIAGNOSIS — Z9221 Personal history of antineoplastic chemotherapy: Secondary | ICD-10-CM | POA: Diagnosis not present

## 2012-02-08 DIAGNOSIS — Z90710 Acquired absence of both cervix and uterus: Secondary | ICD-10-CM | POA: Diagnosis not present

## 2012-02-08 DIAGNOSIS — R911 Solitary pulmonary nodule: Secondary | ICD-10-CM | POA: Diagnosis not present

## 2012-02-08 LAB — CMP (CANCER CENTER ONLY)
ALT(SGPT): 20 U/L (ref 10–47)
AST: 27 U/L (ref 11–38)
Albumin: 3.6 g/dL (ref 3.3–5.5)
Calcium: 8.1 mg/dL (ref 8.0–10.3)
Chloride: 102 mEq/L (ref 98–108)
Creat: 0.7 mg/dl (ref 0.6–1.2)
Potassium: 5.4 mEq/L — ABNORMAL HIGH (ref 3.3–4.7)

## 2012-02-08 LAB — CBC WITH DIFFERENTIAL/PLATELET
BASO%: 0.4 % (ref 0.0–2.0)
Eosinophils Absolute: 0 10*3/uL (ref 0.0–0.5)
MONO#: 0.3 10*3/uL (ref 0.1–0.9)
NEUT#: 3.2 10*3/uL (ref 1.5–6.5)
RBC: 4.29 10*6/uL (ref 3.70–5.45)
RDW: 15 % — ABNORMAL HIGH (ref 11.2–14.5)
WBC: 4.9 10*3/uL (ref 3.9–10.3)
lymph#: 1.3 10*3/uL (ref 0.9–3.3)

## 2012-02-08 LAB — LACTATE DEHYDROGENASE: LDH: 219 U/L (ref 94–250)

## 2012-02-08 MED ORDER — IOHEXOL 300 MG/ML  SOLN
125.0000 mL | Freq: Once | INTRAMUSCULAR | Status: AC | PRN
  Administered 2012-02-08: 125 mL via INTRAVENOUS

## 2012-02-08 NOTE — Progress Notes (Signed)
  Faxed CMET to Dr. Cleta Alberts.  Also called Dr. Ellis Parents office and notified.

## 2012-02-08 NOTE — Telephone Encounter (Signed)
Informed pt CMET showed increased sodium and potassium levels.  Per Dr. Dalene Carrow- results will be forwarded to primary MD to address.  Pt stated primary MD is Dr. Cleta Alberts at [facility].

## 2012-02-08 NOTE — Telephone Encounter (Signed)
Received call from [facility] at Dr. Lonell Face office, they did a CMET in regards to upcoming CT and wanted [place] to know that patients sodium and potasium levels were abnormal.  Copy of reports were faxed over.  Sodium: 159 Potassium: 5.4  Patient has been notified by his office, but wanted [place] to review so we could address.  Fax in Dr. Deforest Hoyles box, and note sent to him to review tomorrow.

## 2012-02-09 ENCOUNTER — Ambulatory Visit (INDEPENDENT_AMBULATORY_CARE_PROVIDER_SITE_OTHER): Payer: Medicare Other | Admitting: Emergency Medicine

## 2012-02-09 ENCOUNTER — Other Ambulatory Visit: Payer: Self-pay | Admitting: *Deleted

## 2012-02-09 VITALS — BP 136/79 | HR 85 | Temp 98.5°F | Resp 18 | Ht 63.0 in | Wt 191.0 lb

## 2012-02-09 DIAGNOSIS — E870 Hyperosmolality and hypernatremia: Secondary | ICD-10-CM

## 2012-02-09 DIAGNOSIS — C8589 Other specified types of non-Hodgkin lymphoma, extranodal and solid organ sites: Secondary | ICD-10-CM

## 2012-02-09 DIAGNOSIS — C8590 Non-Hodgkin lymphoma, unspecified, unspecified site: Secondary | ICD-10-CM

## 2012-02-09 LAB — BASIC METABOLIC PANEL
Calcium: 9.4 mg/dL (ref 8.4–10.5)
Creat: 0.64 mg/dL (ref 0.50–1.10)
Sodium: 142 mEq/L (ref 135–145)

## 2012-02-09 LAB — OSMOLALITY: Osmolality: 298 mOsm/kg (ref 275–300)

## 2012-02-09 NOTE — Progress Notes (Signed)
  Subjective:    Patient ID: Kelly Ray, female    DOB: 1941/05/29, 71 y.o.   MRN: [id number]  HPI patient was seen yesterday for repeat scans. Apparently with a PET scan blood work she was found to have a serum sodium of 159 and a potassium of 5.7. She was contacted to come in today for repeat blood work she actually feels totally well she is not having problems as far as fluid intake. She has no history of problems with sodium before she says she drank 3 bottles of contrast just prior to the blood being drawn.    Review of Systems     Objective:   Physical Exam patient looks well she is alert cooperative she has no neurological signs. Her hydration status appears normal the PICC line appears normal. Chest clear heart regular rate no murmurs.        Assessment & Plan:  We'll go ahead and repeat a being at C4 potassium and sodium are serum osmolality. Further workup and followup is depending on the result of her repeat labs. Received call back on her stat lab her sodium and potassium were both normal

## 2012-02-09 NOTE — Telephone Encounter (Signed)
Pt notified to come in for repeat blood work

## 2012-02-09 NOTE — Telephone Encounter (Signed)
Make sure patient has been called so she can come in this morning for repeat blood work.

## 2012-02-13 ENCOUNTER — Encounter: Payer: Self-pay | Admitting: Hematology and Oncology

## 2012-02-13 ENCOUNTER — Telehealth: Payer: Self-pay | Admitting: Hematology and Oncology

## 2012-02-13 ENCOUNTER — Ambulatory Visit (HOSPITAL_BASED_OUTPATIENT_CLINIC_OR_DEPARTMENT_OTHER): Payer: Medicare Other | Admitting: Hematology and Oncology

## 2012-02-13 VITALS — BP 132/74 | HR 75 | Temp 98.3°F | Ht 63.0 in | Wt 188.8 lb

## 2012-02-13 DIAGNOSIS — C8589 Other specified types of non-Hodgkin lymphoma, extranodal and solid organ sites: Secondary | ICD-10-CM | POA: Diagnosis not present

## 2012-02-13 DIAGNOSIS — C8590 Non-Hodgkin lymphoma, unspecified, unspecified site: Secondary | ICD-10-CM

## 2012-02-13 NOTE — Progress Notes (Signed)
This office note has been dictated.

## 2012-02-13 NOTE — Telephone Encounter (Signed)
appts made and printed for pt aom °

## 2012-02-13 NOTE — Patient Instructions (Signed)
Kelly Ray  [id number]  [facility] Discharge Instructions  RECOMMENDATIONS MADE BY THE CONSULTANT AND ANY TEST RESULTS WILL BE SENT TO YOUR REFERRING DOCTOR.   EXAM FINDINGS BY MD TODAY AND SIGNS AND SYMPTOMS TO REPORT TO CLINIC OR PRIMARY MD:   Your current list of medications are: Current Outpatient Prescriptions  Medication Sig Dispense Refill  . celecoxib (CELEBREX) 200 MG capsule Take 200 mg by mouth daily.        Marland Kitchen levothyroxine (SYNTHROID, LEVOTHROID) 137 MCG tablet Take 137 mcg by mouth daily.        Marland Kitchen lidocaine-prilocaine (EMLA) cream       . lisinopril-hydrochlorothiazide (PRINZIDE,ZESTORETIC) 20-12.5 MG per tablet Take 1 tablet by mouth daily.  90 tablet  3  . ondansetron (ZOFRAN) 8 MG tablet Take 1 tablet (8 mg total) by mouth every 12 (twelve) hours as needed.  20 tablet  3  . pravastatin (PRAVACHOL) 20 MG tablet Take 20 mg by mouth daily.        Marland Kitchen albuterol (PROVENTIL HFA;VENTOLIN HFA) 108 (90 BASE) MCG/ACT inhaler Inhale 2 puffs into the lungs every 6 (six) hours as needed for wheezing.  1 Inhaler  0  . levofloxacin (LEVAQUIN) 500 MG tablet       . predniSONE (DELTASONE) 20 MG tablet 3 tablets a day for 3 days. 2 tablets a day for 3 days.  1 tablet a day for 3 days  18 tablet  0  . temazepam (RESTORIL) 15 MG capsule Take 1 capsule (15 mg total) by mouth at bedtime as needed.  15 capsule  0     INSTRUCTIONS GIVEN AND DISCUSSED:   SPECIAL INSTRUCTIONS/FOLLOW-UP:  See above.  I acknowledge that I have been informed and understand all the instructions given to me and received a copy. I do not have any more questions at this time, but understand that I may call the [facility] at [phone] during business hours should I have any further questions or need assistance in obtaining follow-up care.

## 2012-02-14 NOTE — Progress Notes (Signed)
CC:   Stan Head. Cleta Alberts, M.D. Ollen Gross, M.D. [address] [place], [place], [place] [phone]  IDENTIFYING STATEMENT:  Patient is a 71 year old woman with history of a large B-cell non-Hodgkin lymphoma who presents for followup.  INTERVAL HISTORY:  The patient was seen 4 months ago.  Has had no major issues or concerns since her last followup visit.  Denies fever chills, night sweats.  She has not lost any weight.  She denies adenopathy.  We reviewed the results of recent CT scan of the neck, chest, abdomen and pelvis obtained on 02/08/2012.  The neck showed no evidence of lymphomatous involvement.  The chest, abdomen and pelvis also showed no evidence of lymphoma involvement.  MEDICATIONS:  Reviewed and updated.  ALLERGIES:  None.  Past medical history, family history, and social history unchanged.  REVIEW OF SYSTEMS:  Ten-point review of systems negative.  PHYSICAL EXAM:  General:  The patient is a well-appearing, well- nourished woman in no distress.  Vitals:  Pulse 75, blood pressure 132/74, temperature 98.3, respirations 20, weight 188.8 pounds.  HEENT: Head is atraumatic, normocephalic.  Sclerae anicteric.  Mouth moist. Chest:  Clear.  CVS:  Unremarkable.  Abdomen:  Soft, nontender.  Bowel sounds present.  Extremities:  No edema or calf tenderness.  Lymph nodes:  No palpable cervical, axillary, and inguinal adenopathy.  CNS: Nonfocal.  LAB DATA:  02/08/2012, white cell count 4.9, hemoglobin 12.9, hematocrit 38.5, platelets 207, sodium 159, potassium 5.4, chloride 102 CO2 32, BUN 17, creatinine 0.7 glucose 105, total bilirubin 0.8, alkaline phosphatase 78, AST 27, ALT 20, calcium 8.1, LDH 219.  IMPRESSION AND PLAN:  Ms. Bowdoin is a 71 year old woman with stage II, large B-cell non-Hodgkin lymphoma diagnosed on 05/04/2011.  She is status post 6 cycles of Rituxan with CHOP from October 2002 through February 2013.  Her current exam including her CT shows no evidence  of recurrence.  She will continue surveillance and follows up in 4 months' time.    ______________________________ Laurice Record, M.D. LIO/MEDQ  D:  02/13/2012  T:  02/13/2012  Job:  [id number]

## 2012-02-15 ENCOUNTER — Ambulatory Visit (HOSPITAL_BASED_OUTPATIENT_CLINIC_OR_DEPARTMENT_OTHER): Payer: Medicare Other

## 2012-02-15 VITALS — BP 125/79 | HR 70 | Temp 98.1°F

## 2012-02-15 DIAGNOSIS — C8581 Other specified types of non-Hodgkin lymphoma, lymph nodes of head, face, and neck: Secondary | ICD-10-CM | POA: Diagnosis not present

## 2012-02-15 DIAGNOSIS — Z452 Encounter for adjustment and management of vascular access device: Secondary | ICD-10-CM

## 2012-02-15 DIAGNOSIS — C8590 Non-Hodgkin lymphoma, unspecified, unspecified site: Secondary | ICD-10-CM

## 2012-02-15 MED ORDER — HEPARIN SOD (PORK) LOCK FLUSH 100 UNIT/ML IV SOLN
500.0000 [IU] | Freq: Once | INTRAVENOUS | Status: AC
  Administered 2012-02-15: 500 [IU] via INTRAVENOUS
  Filled 2012-02-15: qty 5

## 2012-02-15 MED ORDER — SODIUM CHLORIDE 0.9 % IJ SOLN
10.0000 mL | INTRAMUSCULAR | Status: DC | PRN
  Administered 2012-02-15: 10 mL via INTRAVENOUS
  Filled 2012-02-15: qty 10

## 2012-02-15 NOTE — Patient Instructions (Signed)
[facility] Discharge Instructions for Patients Receiving flush Today you received the following flush   . Feel free to call the clinic you have any questions or concerns. The clinic phone number is [phone].   I have been informed and understand all the instructions given to me. I know to contact the clinic, my physician, or go to the Emergency Department if any problems should occur. I do not have any questions at this time, but understand that I may call the clinic during office hours   should I have any questions or need assistance in obtaining follow up care.    __________________________________________  _____________  __________ Signature of Patient or Authorized Representative            Date                   Time    __________________________________________ Nurse's Signature

## 2012-04-04 ENCOUNTER — Ambulatory Visit (HOSPITAL_BASED_OUTPATIENT_CLINIC_OR_DEPARTMENT_OTHER): Payer: Medicare Other

## 2012-04-04 VITALS — BP 118/69 | HR 68 | Temp 97.9°F

## 2012-04-04 DIAGNOSIS — Z452 Encounter for adjustment and management of vascular access device: Secondary | ICD-10-CM | POA: Diagnosis not present

## 2012-04-04 DIAGNOSIS — C8590 Non-Hodgkin lymphoma, unspecified, unspecified site: Secondary | ICD-10-CM

## 2012-04-04 DIAGNOSIS — C8589 Other specified types of non-Hodgkin lymphoma, extranodal and solid organ sites: Secondary | ICD-10-CM | POA: Diagnosis not present

## 2012-04-04 MED ORDER — SODIUM CHLORIDE 0.9 % IJ SOLN
10.0000 mL | INTRAMUSCULAR | Status: DC | PRN
  Administered 2012-04-04: 10 mL via INTRAVENOUS
  Filled 2012-04-04: qty 10

## 2012-04-04 MED ORDER — HEPARIN SOD (PORK) LOCK FLUSH 100 UNIT/ML IV SOLN
500.0000 [IU] | Freq: Once | INTRAVENOUS | Status: AC
  Administered 2012-04-04: 500 [IU] via INTRAVENOUS
  Filled 2012-04-04: qty 5

## 2012-04-04 NOTE — Patient Instructions (Signed)
Call MD with any problems 

## 2012-05-14 ENCOUNTER — Telehealth: Payer: Self-pay | Admitting: Hematology and Oncology

## 2012-05-14 NOTE — Telephone Encounter (Signed)
called pt and she moved her flush

## 2012-05-15 ENCOUNTER — Ambulatory Visit (HOSPITAL_BASED_OUTPATIENT_CLINIC_OR_DEPARTMENT_OTHER): Payer: Medicare Other

## 2012-05-15 VITALS — BP 130/57 | HR 64 | Temp 98.0°F

## 2012-05-15 DIAGNOSIS — C8589 Other specified types of non-Hodgkin lymphoma, extranodal and solid organ sites: Secondary | ICD-10-CM | POA: Diagnosis not present

## 2012-05-15 DIAGNOSIS — C8510 Unspecified B-cell lymphoma, unspecified site: Secondary | ICD-10-CM

## 2012-05-15 DIAGNOSIS — Z452 Encounter for adjustment and management of vascular access device: Secondary | ICD-10-CM

## 2012-05-15 MED ORDER — SODIUM CHLORIDE 0.9 % IJ SOLN
10.0000 mL | INTRAMUSCULAR | Status: DC | PRN
  Administered 2012-05-15: 10 mL via INTRAVENOUS
  Filled 2012-05-15: qty 10

## 2012-05-15 MED ORDER — HEPARIN SOD (PORK) LOCK FLUSH 100 UNIT/ML IV SOLN
500.0000 [IU] | Freq: Once | INTRAVENOUS | Status: AC
  Administered 2012-05-15: 500 [IU] via INTRAVENOUS
  Filled 2012-05-15: qty 5

## 2012-05-15 NOTE — Progress Notes (Signed)
Patient concerned that her emla cream was expiring.  Patient to call her pharmacy for refill.

## 2012-05-15 NOTE — Patient Instructions (Signed)
Call MD for problems 

## 2012-07-03 ENCOUNTER — Ambulatory Visit: Payer: Medicare Other

## 2012-07-03 ENCOUNTER — Other Ambulatory Visit (HOSPITAL_BASED_OUTPATIENT_CLINIC_OR_DEPARTMENT_OTHER): Payer: Medicare Other | Admitting: Lab

## 2012-07-03 VITALS — BP 100/59 | HR 64 | Temp 98.0°F

## 2012-07-03 DIAGNOSIS — C8510 Unspecified B-cell lymphoma, unspecified site: Secondary | ICD-10-CM

## 2012-07-03 DIAGNOSIS — C8590 Non-Hodgkin lymphoma, unspecified, unspecified site: Secondary | ICD-10-CM

## 2012-07-03 DIAGNOSIS — C8589 Other specified types of non-Hodgkin lymphoma, extranodal and solid organ sites: Secondary | ICD-10-CM

## 2012-07-03 LAB — COMPREHENSIVE METABOLIC PANEL (CC13)
ALT: 13 U/L (ref 0–55)
AST: 17 U/L (ref 5–34)
Alkaline Phosphatase: 100 U/L (ref 40–150)
Chloride: 103 mEq/L (ref 98–107)
Creatinine: 0.7 mg/dL (ref 0.6–1.1)
Total Bilirubin: 0.34 mg/dL (ref 0.20–1.20)

## 2012-07-03 LAB — CBC WITH DIFFERENTIAL/PLATELET
BASO%: 1.4 % (ref 0.0–2.0)
EOS%: 1.5 % (ref 0.0–7.0)
HCT: 35.9 % (ref 34.8–46.6)
MCH: 30 pg (ref 25.1–34.0)
MCHC: 33.6 g/dL (ref 31.5–36.0)
MONO%: 6.2 % (ref 0.0–14.0)
NEUT%: 61.6 % (ref 38.4–76.8)
lymph#: 2.2 10*3/uL (ref 0.9–3.3)

## 2012-07-03 MED ORDER — SODIUM CHLORIDE 0.9 % IJ SOLN
10.0000 mL | INTRAMUSCULAR | Status: DC | PRN
  Administered 2012-07-03: 10 mL via INTRAVENOUS
  Filled 2012-07-03: qty 10

## 2012-07-03 MED ORDER — HEPARIN SOD (PORK) LOCK FLUSH 100 UNIT/ML IV SOLN
500.0000 [IU] | Freq: Once | INTRAVENOUS | Status: AC
  Administered 2012-07-03: 500 [IU] via INTRAVENOUS
  Filled 2012-07-03: qty 5

## 2012-07-07 ENCOUNTER — Other Ambulatory Visit: Payer: Medicare Other

## 2012-07-08 ENCOUNTER — Ambulatory Visit (HOSPITAL_COMMUNITY)
Admission: RE | Admit: 2012-07-08 | Discharge: 2012-07-08 | Disposition: A | Discharge status: Home / Self Care | Payer: Medicare Other | Source: Ambulatory Visit | Attending: Hematology and Oncology | Admitting: Hematology and Oncology

## 2012-07-08 ENCOUNTER — Other Ambulatory Visit: Payer: Self-pay | Admitting: Hematology and Oncology

## 2012-07-08 ENCOUNTER — Ambulatory Visit (HOSPITAL_COMMUNITY): Payer: Medicare Other

## 2012-07-08 DIAGNOSIS — C8590 Non-Hodgkin lymphoma, unspecified, unspecified site: Secondary | ICD-10-CM

## 2012-07-08 DIAGNOSIS — C8589 Other specified types of non-Hodgkin lymphoma, extranodal and solid organ sites: Secondary | ICD-10-CM | POA: Diagnosis not present

## 2012-07-08 DIAGNOSIS — K7689 Other specified diseases of liver: Secondary | ICD-10-CM | POA: Diagnosis not present

## 2012-07-08 DIAGNOSIS — Z8572 Personal history of non-Hodgkin lymphomas: Secondary | ICD-10-CM | POA: Diagnosis not present

## 2012-07-08 DIAGNOSIS — Z9221 Personal history of antineoplastic chemotherapy: Secondary | ICD-10-CM | POA: Insufficient documentation

## 2012-07-08 DIAGNOSIS — J984 Other disorders of lung: Secondary | ICD-10-CM | POA: Diagnosis not present

## 2012-07-08 MED ORDER — IOHEXOL 300 MG/ML  SOLN
125.0000 mL | Freq: Once | INTRAMUSCULAR | Status: AC | PRN
  Administered 2012-07-08: 125 mL via INTRAVENOUS

## 2012-07-09 ENCOUNTER — Ambulatory Visit (HOSPITAL_BASED_OUTPATIENT_CLINIC_OR_DEPARTMENT_OTHER): Payer: Medicare Other | Admitting: Hematology and Oncology

## 2012-07-09 ENCOUNTER — Telehealth: Payer: Self-pay | Admitting: Hematology and Oncology

## 2012-07-09 ENCOUNTER — Encounter: Payer: Self-pay | Admitting: Hematology and Oncology

## 2012-07-09 VITALS — BP 112/64 | HR 70 | Temp 98.2°F | Resp 20 | Ht 63.0 in | Wt 192.5 lb

## 2012-07-09 DIAGNOSIS — C8590 Non-Hodgkin lymphoma, unspecified, unspecified site: Secondary | ICD-10-CM

## 2012-07-09 DIAGNOSIS — C8589 Other specified types of non-Hodgkin lymphoma, extranodal and solid organ sites: Secondary | ICD-10-CM

## 2012-07-09 DIAGNOSIS — C8510 Unspecified B-cell lymphoma, unspecified site: Secondary | ICD-10-CM

## 2012-07-09 MED ORDER — LIDOCAINE-PRILOCAINE 2.5-2.5 % EX CREA: 1.0000 "application " | TOPICAL_CREAM | CUTANEOUS | Status: DC | PRN

## 2012-07-09 NOTE — Progress Notes (Signed)
This office note has been dictated.

## 2012-07-09 NOTE — Telephone Encounter (Signed)
gv and printed pt appt schedule for Dec thru Feb....advised pt that Central scheduling will contact her with ct appt/time

## 2012-07-09 NOTE — Patient Instructions (Signed)
Kelly Ray  [id number]   [facility] - AFTER VISIT SUMMARY   **RECOMMENDATIONS MADE BY THE CONSULTANT AND ANY TEST    RESULTS WILL BE SENT TO YOUR REFERRING DOCTORS.   YOUR EXAM FINDINGS, LABS AND RESULTS WERE DISCUSSED BY YOUR MD TODAY.  YOU CAN GO TO THE [place] WEB SITE FOR INSTRUCTIONS ON HOW TO ASSESS MY CHART FOR ADDITIONAL INFORMATION AS NEEDED.  Your Updated drug allergies are: Allergies as of 07/09/2012  . (No Known Allergies)    Your current list of medications are: Current Outpatient Prescriptions  Medication Sig Dispense Refill  . celecoxib (CELEBREX) 200 MG capsule Take 200 mg by mouth daily.        Marland Kitchen levothyroxine (SYNTHROID, LEVOTHROID) 137 MCG tablet Take 137 mcg by mouth daily.        Marland Kitchen lidocaine-prilocaine (EMLA) cream       . lisinopril-hydrochlorothiazide (PRINZIDE,ZESTORETIC) 20-12.5 MG per tablet Take 1 tablet by mouth daily.  90 tablet  3  . ondansetron (ZOFRAN) 8 MG tablet Take 1 tablet (8 mg total) by mouth every 12 (twelve) hours as needed.  20 tablet  3  . pravastatin (PRAVACHOL) 20 MG tablet Take 20 mg by mouth daily.        . predniSONE (DELTASONE) 20 MG tablet 3 tablets a day for 3 days. 2 tablets a day for 3 days.  1 tablet a day for 3 days  18 tablet  0  . temazepam (RESTORIL) 15 MG capsule Take 1 capsule (15 mg total) by mouth at bedtime as needed.  15 capsule  0     INSTRUCTIONS GIVEN AND DISCUSSED:  See attached schedule   SPECIAL INSTRUCTIONS/FOLLOW-UP:  See above.  I acknowledge that I have been informed and understand all the instructions given to me and received a copy.I know to contact the clinic, my physician, or go to the emergency Department if any problems should occur.   I do not have any more questions at this time, but understand that I may call the [facility] at [phone] during business hours should I have any further questions or need assistance in obtaining follow-up care.

## 2012-07-10 NOTE — Progress Notes (Signed)
CC:   Stan Head. Cleta Alberts, M.D. Ollen Gross, M.D. [address] [place], [place], [place] [phone]  IDENTIFYING STATEMENT:  The patient is a 71 year old woman with large B- cell non-Hodgkin lymphoma who presents for followup.  INTERVAL HISTORY:  The patient has no present concerns.  Was last seen 4 months ago.  Denies fever, chills, or night sweats.  Denies lymph node swelling.  She denies pain.  We reviewed results of recent CT scans of the neck, abdomen, and pelvis which all indicate no evidence of recurrence of lymphoma.  There was a tiny hypo-enhancing lesion in the right hepatic lobe and a calcified granuloma.  MEDICATIONS:  Reviewed and updated.  PAST MEDICAL HISTORY/FAMILY HISTORY/SOCIAL HISTORY:  Unchanged.  REVIEW OF SYSTEMS:  Ten-point review of systems negative.  PHYSICAL EXAM:  General:  The patient is a well-appearing, well- nourished woman in no distress.  Vitals:  Pulse 70, blood pressure 112/64, temperature 98.2, respirations 20, weight 192.5 pounds.  HEENT: Head is atraumatic, normocephalic.  Sclerae anicteric.  Mouth is moist. Chest:  Clear.  CVS:  Unremarkable.  Abdomen:  Soft, nontender.  No masses.  Bowel sounds present.  Extremities:  No edema or calf tenderness.  Lymph nodes:  No adenopathy.  CNS:  Nonfocal.  LAB DATA:  07/03/2012 white cell count 7.4, hemoglobin 12.1, hematocrit 35.9, platelets 233.  Sodium 137, potassium 4, chloride 103, CO2 27, BUN 21 creatinine 0.7, glucose 94, T bilirubin 0.34, alkaline phosphatase 100, AST 17, ALT 13.  LDH 188.  Results of CTs as in interval history.  IMPRESSION AND PLAN:  Ms. Guster is a 71 year old woman with stage II, large B-cell non-Hodgkin lymphoma diagnosed on 05/04/2011.  She is status post 6 cycles of Rituxan with CHOP from October 2002 through February 2013.  Her current exam reveals no evidence of recurrence.  She will continue surveillance and follows up in 4 months'  time.    ______________________________ Laurice Record, M.D. LIO/MEDQ  D:  07/09/2012  T:  07/10/2012  Job:  [id number]

## 2012-07-11 ENCOUNTER — Ambulatory Visit: Payer: Medicare Other | Admitting: Hematology and Oncology

## 2012-07-16 DIAGNOSIS — Z23 Encounter for immunization: Secondary | ICD-10-CM | POA: Diagnosis not present

## 2012-07-25 ENCOUNTER — Ambulatory Visit: Payer: Medicare Other | Admitting: Hematology and Oncology

## 2012-07-31 ENCOUNTER — Other Ambulatory Visit: Payer: Self-pay | Admitting: Emergency Medicine

## 2012-07-31 NOTE — Telephone Encounter (Signed)
Please pull paper chart.  

## 2012-07-31 NOTE — Telephone Encounter (Signed)
CHART PULLED AT NURSE STATION PA POOL [id number]

## 2012-08-11 ENCOUNTER — Ambulatory Visit (HOSPITAL_BASED_OUTPATIENT_CLINIC_OR_DEPARTMENT_OTHER): Payer: Medicare Other

## 2012-08-11 VITALS — BP 103/60 | HR 69 | Temp 98.3°F

## 2012-08-11 DIAGNOSIS — C8589 Other specified types of non-Hodgkin lymphoma, extranodal and solid organ sites: Secondary | ICD-10-CM | POA: Diagnosis not present

## 2012-08-11 DIAGNOSIS — Z452 Encounter for adjustment and management of vascular access device: Secondary | ICD-10-CM | POA: Diagnosis not present

## 2012-08-11 DIAGNOSIS — C8590 Non-Hodgkin lymphoma, unspecified, unspecified site: Secondary | ICD-10-CM

## 2012-08-11 MED ORDER — SODIUM CHLORIDE 0.9 % IJ SOLN
10.0000 mL | INTRAMUSCULAR | Status: DC | PRN
  Administered 2012-08-11: 10 mL via INTRAVENOUS
  Filled 2012-08-11: qty 10

## 2012-08-11 MED ORDER — HEPARIN SOD (PORK) LOCK FLUSH 100 UNIT/ML IV SOLN
500.0000 [IU] | Freq: Once | INTRAVENOUS | Status: AC
  Administered 2012-08-11: 500 [IU] via INTRAVENOUS
  Filled 2012-08-11: qty 5

## 2012-09-05 ENCOUNTER — Ambulatory Visit (HOSPITAL_BASED_OUTPATIENT_CLINIC_OR_DEPARTMENT_OTHER): Payer: Medicare Other

## 2012-09-05 VITALS — BP 148/67 | HR 68 | Temp 98.5°F

## 2012-09-05 DIAGNOSIS — Z452 Encounter for adjustment and management of vascular access device: Secondary | ICD-10-CM

## 2012-09-05 DIAGNOSIS — C8589 Other specified types of non-Hodgkin lymphoma, extranodal and solid organ sites: Secondary | ICD-10-CM

## 2012-09-05 DIAGNOSIS — C8590 Non-Hodgkin lymphoma, unspecified, unspecified site: Secondary | ICD-10-CM

## 2012-09-05 MED ORDER — HEPARIN SOD (PORK) LOCK FLUSH 100 UNIT/ML IV SOLN
500.0000 [IU] | Freq: Once | INTRAVENOUS | Status: AC
  Administered 2012-09-05: 500 [IU] via INTRAVENOUS
  Filled 2012-09-05: qty 5

## 2012-09-05 MED ORDER — SODIUM CHLORIDE 0.9 % IJ SOLN
10.0000 mL | INTRAMUSCULAR | Status: DC | PRN
  Administered 2012-09-05: 10 mL via INTRAVENOUS
  Filled 2012-09-05: qty 10

## 2012-09-05 NOTE — Patient Instructions (Signed)
Call MD for problems 

## 2012-09-27 ENCOUNTER — Encounter: Payer: Self-pay | Admitting: Oncology

## 2012-09-27 ENCOUNTER — Telehealth: Payer: Self-pay | Admitting: Oncology

## 2012-09-27 NOTE — Telephone Encounter (Signed)
called pt with new dr appt for dr g and lisa,    anne

## 2012-10-15 ENCOUNTER — Other Ambulatory Visit: Payer: Self-pay | Admitting: Emergency Medicine

## 2012-10-24 ENCOUNTER — Other Ambulatory Visit (HOSPITAL_BASED_OUTPATIENT_CLINIC_OR_DEPARTMENT_OTHER): Payer: Medicare Other | Admitting: Lab

## 2012-10-24 ENCOUNTER — Encounter (HOSPITAL_COMMUNITY): Payer: Self-pay

## 2012-10-24 ENCOUNTER — Ambulatory Visit (HOSPITAL_COMMUNITY)
Admission: RE | Admit: 2012-10-24 | Discharge: 2012-10-24 | Disposition: A | Discharge status: Home / Self Care | Payer: Medicare Other | Source: Ambulatory Visit | Attending: Hematology and Oncology | Admitting: Hematology and Oncology

## 2012-10-24 ENCOUNTER — Other Ambulatory Visit: Payer: Self-pay | Admitting: Hematology and Oncology

## 2012-10-24 ENCOUNTER — Ambulatory Visit (HOSPITAL_BASED_OUTPATIENT_CLINIC_OR_DEPARTMENT_OTHER): Payer: Medicare Other

## 2012-10-24 VITALS — BP 159/69 | HR 69 | Temp 97.5°F

## 2012-10-24 DIAGNOSIS — C8589 Other specified types of non-Hodgkin lymphoma, extranodal and solid organ sites: Secondary | ICD-10-CM | POA: Insufficient documentation

## 2012-10-24 DIAGNOSIS — M47812 Spondylosis without myelopathy or radiculopathy, cervical region: Secondary | ICD-10-CM | POA: Insufficient documentation

## 2012-10-24 DIAGNOSIS — C8590 Non-Hodgkin lymphoma, unspecified, unspecified site: Secondary | ICD-10-CM

## 2012-10-24 DIAGNOSIS — Z9221 Personal history of antineoplastic chemotherapy: Secondary | ICD-10-CM | POA: Diagnosis not present

## 2012-10-24 DIAGNOSIS — Z90710 Acquired absence of both cervix and uterus: Secondary | ICD-10-CM | POA: Diagnosis not present

## 2012-10-24 DIAGNOSIS — I2510 Atherosclerotic heart disease of native coronary artery without angina pectoris: Secondary | ICD-10-CM | POA: Insufficient documentation

## 2012-10-24 DIAGNOSIS — J984 Other disorders of lung: Secondary | ICD-10-CM | POA: Insufficient documentation

## 2012-10-24 DIAGNOSIS — M51379 Other intervertebral disc degeneration, lumbosacral region without mention of lumbar back pain or lower extremity pain: Secondary | ICD-10-CM | POA: Insufficient documentation

## 2012-10-24 DIAGNOSIS — M5137 Other intervertebral disc degeneration, lumbosacral region: Secondary | ICD-10-CM | POA: Insufficient documentation

## 2012-10-24 LAB — CBC WITH DIFFERENTIAL/PLATELET
Basophils Absolute: 0 10*3/uL (ref 0.0–0.1)
Eosinophils Absolute: 0.1 10*3/uL (ref 0.0–0.5)
HCT: 38.5 % (ref 34.8–46.6)
HGB: 13.5 g/dL (ref 11.6–15.9)
MCV: 87.2 fL (ref 79.5–101.0)
NEUT#: 3.1 10*3/uL (ref 1.5–6.5)
NEUT%: 55.2 % (ref 38.4–76.8)
RDW: 13.7 % (ref 11.2–14.5)
lymph#: 2.1 10*3/uL (ref 0.9–3.3)

## 2012-10-24 LAB — COMPREHENSIVE METABOLIC PANEL (CC13)
Albumin: 3.7 g/dL (ref 3.5–5.0)
BUN: 21.9 mg/dL (ref 7.0–26.0)
Calcium: 9.2 mg/dL (ref 8.4–10.4)
Chloride: 103 mEq/L (ref 98–107)
Creatinine: 0.7 mg/dL (ref 0.6–1.1)
Glucose: 97 mg/dl (ref 70–99)
Potassium: 4.5 mEq/L (ref 3.5–5.1)

## 2012-10-24 LAB — LACTATE DEHYDROGENASE (CC13): LDH: 194 U/L (ref 125–245)

## 2012-10-24 MED ORDER — SODIUM CHLORIDE 0.9 % IJ SOLN
10.0000 mL | INTRAMUSCULAR | Status: DC | PRN
  Administered 2012-10-24: 10 mL via INTRAVENOUS
  Filled 2012-10-24: qty 10

## 2012-10-24 MED ORDER — IOHEXOL 300 MG/ML  SOLN
125.0000 mL | Freq: Once | INTRAMUSCULAR | Status: AC | PRN
  Administered 2012-10-24: 125 mL via INTRAVENOUS

## 2012-10-24 MED ORDER — HEPARIN SOD (PORK) LOCK FLUSH 100 UNIT/ML IV SOLN
500.0000 [IU] | Freq: Once | INTRAVENOUS | Status: AC
  Administered 2012-10-24: 500 [IU] via INTRAVENOUS
  Filled 2012-10-24: qty 5

## 2012-10-24 NOTE — Patient Instructions (Signed)
Patient to Radiology at [facility] for CT scan.  Call MD for problems or concerns

## 2012-10-24 NOTE — Progress Notes (Signed)
Patient left accessed for CT scan today

## 2012-10-28 ENCOUNTER — Telehealth: Payer: Self-pay | Admitting: Oncology

## 2012-10-28 ENCOUNTER — Ambulatory Visit (HOSPITAL_BASED_OUTPATIENT_CLINIC_OR_DEPARTMENT_OTHER): Payer: Medicare Other | Admitting: Nurse Practitioner

## 2012-10-28 ENCOUNTER — Ambulatory Visit: Payer: Medicare Other | Admitting: Hematology and Oncology

## 2012-10-28 ENCOUNTER — Encounter (HOSPITAL_COMMUNITY): Payer: Self-pay | Admitting: Pharmacy Technician

## 2012-10-28 VITALS — BP 134/83 | HR 67 | Temp 97.1°F | Resp 20 | Ht 63.0 in | Wt 197.9 lb

## 2012-10-28 DIAGNOSIS — C8510 Unspecified B-cell lymphoma, unspecified site: Secondary | ICD-10-CM

## 2012-10-28 DIAGNOSIS — C8581 Other specified types of non-Hodgkin lymphoma, lymph nodes of head, face, and neck: Secondary | ICD-10-CM | POA: Diagnosis not present

## 2012-10-28 DIAGNOSIS — M1990 Unspecified osteoarthritis, unspecified site: Secondary | ICD-10-CM

## 2012-10-28 DIAGNOSIS — I10 Essential (primary) hypertension: Secondary | ICD-10-CM

## 2012-10-28 NOTE — Progress Notes (Signed)
OFFICE PROGRESS NOTE  Interval history:  Kelly Ray is a 72 year old woman diagnosed with stage II non-Hodgkin's lymphoma in August/September 2012. She initially presented with enlarged right neck lymph nodes. Next CT showed multiple enlarged right neck lymph nodes. Excisional biopsy of a posterior right neck node on 05/04/2011 showed diffuse large B-cell lymphoma, CD20 positive, CD79a positive and CD10 positive. CT scans of the chest/abdomen/pelvis on 05/29/2011 showed enlarged right supraclavicular lymph nodes measuring 14 mm; no axillary lymphadenopathy; no enlarged mediastinal or hilar lymph nodes; there was no evidence of lymphoma within the abdomen or pelvis. Staging PET scan also on 05/29/2011 showed a chain of contiguous hypermetabolic right cervical lymph nodes extending from the level II nodal position down to the right supraclavicular nodes and involving a high right paratracheal node. The nodes had intense hypermetabolic activity with SUV max equal to 21.28. Nodes measured up to 2 cm in the right level II positions. There was a single hypermetabolic left level II lymph node with SUV max 7.6. There was no abnormal hypermetabolic activity within the spleen, abdominal or pelvic lymph nodes.  She had a Port-A-Cath placed on 06/01/2011.  She completed 6 cycles of CHOP/Rituxan 06/06/2011 through 09/19/2011. Restaging PET scan after 2 cycles on 07/12/2011 showed interval resolution of right cervical and supraclavicular lymphadenopathy. There was a focus of uptake at the right tonsillar pillar not present on the prior study.  Restaging PET scan on 10/12/2011 showed no evidence of lymphomatous involvement in the neck, chest, abdomen or pelvis.  Most recent restaging CT scans of the chest, abdomen and pelvis on 10/24/2012 showed no specific features to suggest residual or recurrence of tumor within the chest, abdomen or pelvis.  She is seen today to establish care with Dr. Truett Perna.  Past medical  history includes hypertension, hypothyroidism, hypercholesterolemia and osteoarthritis. She underwent a left total knee replacement 2 years ago.  Current medications include Celebrex 200 mg daily, Synthroid 137 mcg daily, EMLA cream as needed, lisinopril/ hydrochlorothiazide 20/12.5 mg daily and Pravachol 20 mg daily.  No known drug allergies.  Maternal grandmother had a history of skin cancer. Mother died at age 38 with a GI malignancy. Brother died at age 39 with primary liver cancer.  She lives in [place]. She is married. She has 3 healthy children. She is retired. She previously worked for a [employer]/product [employer]. No tobacco use. Rare alcohol intake.  She overall feels well. She notes that she tires easily. No fevers or sweats. She has a good appetite. No weight loss. She denies pain. No shortness of breath. Occasional "sinus" cough. No chest pain. No leg swelling or calf pain. No bowel or bladder problems. No numbness or tingling in her hands or feet.   Objective: Blood pressure 134/83, pulse 67, temperature 97.1 F (36.2 C), temperature source Oral, resp. rate 20, height 5\' 3"  (1.6 m), weight 197 lb 14.4 oz (89.767 kg).  Oropharynx is without thrush or ulceration. No palpable cervical, supraclavicular, axillary or inguinal lymph nodes. Lungs are clear. No wheezes or rales. Regular cardiac rhythm. Port-A-Cath site is without erythema. Abdomen is soft and nontender. No organomegaly. Extremities are without edema. Calves are soft and nontender.  Lab Results: Lab Results  Component Value Date   WBC 5.6 10/24/2012   HGB 13.5 10/24/2012   HCT 38.5 10/24/2012   MCV 87.2 10/24/2012   PLT 221 10/24/2012    Chemistry:    Chemistry      Component Value Date/Time   NA 139 10/24/2012 0859  NA 142 02/09/2012 1007   NA 159* 02/08/2012 0931   K 4.5 10/24/2012 0859   K 4.6 02/09/2012 1007   K 5.4* 02/08/2012 0931   CL 103 10/24/2012 0859   CL 104 02/09/2012 1007   CL 102 02/08/2012 0931   CO2 28  10/24/2012 0859   CO2 31 02/09/2012 1007   CO2 32 02/08/2012 0931   BUN 21.9 10/24/2012 0859   BUN 20 02/09/2012 1007   BUN 17 02/08/2012 0931   CREATININE 0.7 10/24/2012 0859   CREATININE 0.64 02/09/2012 1007   CREATININE 0.63 10/12/2011 0851      Component Value Date/Time   CALCIUM 9.2 10/24/2012 0859   CALCIUM 9.4 02/09/2012 1007   CALCIUM 8.1 02/08/2012 0931   ALKPHOS 105 10/24/2012 0859   ALKPHOS 78 02/08/2012 0931   ALKPHOS 66 10/12/2011 0851   AST 17 10/24/2012 0859   AST 27 02/08/2012 0931   AST 31 10/12/2011 0851   ALT 13 10/24/2012 0859   ALT 22 10/12/2011 0851   BILITOT 0.59 10/24/2012 0859   BILITOT 0.80 02/08/2012 0931   BILITOT 0.5 10/12/2011 0851       Studies/Results: Ct Soft Tissue Neck W Contrast  10/24/2012  *RADIOLOGY REPORT*  Clinical Data: Non-Hodgkins lymphoma diagnosed in 2012. Chemotherapy complete February 2013.  Restaging.  CT NECK WITH CONTRAST  Technique:  Multidetector CT imaging of the neck was performed with intravenous contrast.  Contrast: OMNIPAQUE IOHEXOL 300 MG/ML  SOLN  Comparison: Most recent CT neck 07/08/2012 and 02/08/2012. Original CT neck 04/09/2011. Most recent PET scan 10/12/2011.  Findings: There is no pathologic cervical adenopathy.  No neck masses are seen.  Asymmetric enlargement right jugular vein is normal variant.  Calcific atheromatous disease right carotid bifurcation appears nonstenotic. Dominant right vertebral. Unremarkable thyroid.  Cervical spondylosis without osseous lesion. Visualized posterior fossa/intracranial compartment negative.  No acute sinus or mastoid disease.  Trachea midline. No significant change from most recent priors.  IMPRESSION: No evidence for recurrence of non-Hodgkins lymphoma in the neck.   Original Report Authenticated By: Davonna Belling, M.D.    Ct Chest W Contrast  10/24/2012  *RADIOLOGY REPORT*  Clinical Data:  Lymphoma  CT CHEST, ABDOMEN AND PELVIS WITH CONTRAST  Technique:  Multidetector CT imaging of the chest,  abdomen and pelvis was performed following the standard protocol during bolus administration of intravenous contrast.  Contrast: OMNIPAQUE IOHEXOL 300 MG/ML  SOLN  Comparison:  07/08/2012  CT CHEST  Findings:  Lungs/pleura: No pleural effusion identified.  There is no airspace consolidation.  Biapical scar like densities are again noted and appears similar to previous exam.  Heart/Mediastinum: The heart size is normal.  No pericardial effusion.  Calcification within the LAD coronary artery noted. There is no mediastinal or hilar adenopathy.  No pericardial effusion identified.  Bones/Musculoskeletal:  No enlarged supraclavicular or axillary lymph nodes.  No aggressive lytic or sclerotic bone lesions noted.  IMPRESSION:  1.  There are no specific features to suggest residual or recurrence of tumor within the chest.  CT ABDOMEN AND PELVIS  Findings:  Along the dome of the liver there is a low attenuation structure measuring 6 mm, image number 39.  This is unchanged from previous exam.  Gallbladder is normal.  No biliary dilatation.  The pancreas is unremarkable.  Normal appearance of the spleen.  Both adrenal glands are normal.  Normal appearance of both kidneys. The urinary bladder appears normal.  Previous hysterectomy.  There are no enlarged upper abdominal  lymph nodes.  There is no pelvic or inguinal adenopathy identified.  Fluid or fluid collections identified within the abdomen or pelvis.  The stomach appears normal.  The small bowel loops are unremarkable.  The appendix is normal.  Normal appearance of the colon  Review of the visualized bony structures is significant for lumbar degenerative disc disease.  IMPRESSION:  1.  No acute findings. 2.  No specific features identified to suggest residual or recurrence of lymphoma within the upper abdomen or pelvis.   Original Report Authenticated By: Signa Kell, M.D.    Ct Abdomen Pelvis W Contrast  10/24/2012  *RADIOLOGY REPORT*  Clinical Data:  Lymphoma   CT CHEST, ABDOMEN AND PELVIS WITH CONTRAST  Technique:  Multidetector CT imaging of the chest, abdomen and pelvis was performed following the standard protocol during bolus administration of intravenous contrast.  Contrast: OMNIPAQUE IOHEXOL 300 MG/ML  SOLN  Comparison:  07/08/2012  CT CHEST  Findings:  Lungs/pleura: No pleural effusion identified.  There is no airspace consolidation.  Biapical scar like densities are again noted and appears similar to previous exam.  Heart/Mediastinum: The heart size is normal.  No pericardial effusion.  Calcification within the LAD coronary artery noted. There is no mediastinal or hilar adenopathy.  No pericardial effusion identified.  Bones/Musculoskeletal:  No enlarged supraclavicular or axillary lymph nodes.  No aggressive lytic or sclerotic bone lesions noted.  IMPRESSION:  1.  There are no specific features to suggest residual or recurrence of tumor within the chest.  CT ABDOMEN AND PELVIS  Findings:  Along the dome of the liver there is a low attenuation structure measuring 6 mm, image number 39.  This is unchanged from previous exam.  Gallbladder is normal.  No biliary dilatation.  The pancreas is unremarkable.  Normal appearance of the spleen.  Both adrenal glands are normal.  Normal appearance of both kidneys. The urinary bladder appears normal.  Previous hysterectomy.  There are no enlarged upper abdominal lymph nodes.  There is no pelvic or inguinal adenopathy identified.  Fluid or fluid collections identified within the abdomen or pelvis.  The stomach appears normal.  The small bowel loops are unremarkable.  The appendix is normal.  Normal appearance of the colon  Review of the visualized bony structures is significant for lumbar degenerative disc disease.  IMPRESSION:  1.  No acute findings. 2.  No specific features identified to suggest residual or recurrence of lymphoma within the upper abdomen or pelvis.   Original Report Authenticated By: Signa Kell, M.D.      Medications: I have reviewed the patient's current medications.  Assessment/Plan:  1. 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 2. Port-A-Cath placement 06/01/2011. 3. Hypertension. 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 5. Hypercholesterolemia. 6. Osteoarthritis.  Disposition-Kelly Ray remains in remission from the non-Hodgkin's lymphoma. She will return for a followup visit in 6 months. We are referring her to interventional radiology for removal of the Port-A-Cath.  She will contact the office prior to her next visit with any problems.  Patient seen with Dr. Truett Perna.  Lonna Cobb ANP/GNP-BC

## 2012-10-28 NOTE — Telephone Encounter (Signed)
gv and printed pt appt schedule for March and Sept...s/w Tine in IR pt schedule for port removal for 3.11.144 @ 9:30 am...pt aware

## 2012-11-02 ENCOUNTER — Other Ambulatory Visit: Payer: Self-pay | Admitting: Radiology

## 2012-11-04 ENCOUNTER — Ambulatory Visit (HOSPITAL_COMMUNITY)
Admission: RE | Admit: 2012-11-04 | Discharge: 2012-11-04 | Disposition: A | Discharge status: Home / Self Care | Payer: Medicare Other | Source: Ambulatory Visit | Attending: Nurse Practitioner | Admitting: Nurse Practitioner

## 2012-11-04 ENCOUNTER — Encounter (HOSPITAL_COMMUNITY): Payer: Self-pay

## 2012-11-04 VITALS — BP 101/54 | HR 68 | Temp 98.1°F | Resp 16

## 2012-11-04 DIAGNOSIS — C8510 Unspecified B-cell lymphoma, unspecified site: Secondary | ICD-10-CM

## 2012-11-04 DIAGNOSIS — I10 Essential (primary) hypertension: Secondary | ICD-10-CM | POA: Insufficient documentation

## 2012-11-04 DIAGNOSIS — Z452 Encounter for adjustment and management of vascular access device: Secondary | ICD-10-CM | POA: Insufficient documentation

## 2012-11-04 DIAGNOSIS — Z96659 Presence of unspecified artificial knee joint: Secondary | ICD-10-CM | POA: Diagnosis not present

## 2012-11-04 DIAGNOSIS — M1710 Unilateral primary osteoarthritis, unspecified knee: Secondary | ICD-10-CM | POA: Insufficient documentation

## 2012-11-04 DIAGNOSIS — IMO0002 Reserved for concepts with insufficient information to code with codable children: Secondary | ICD-10-CM | POA: Insufficient documentation

## 2012-11-04 DIAGNOSIS — Z79899 Other long term (current) drug therapy: Secondary | ICD-10-CM | POA: Diagnosis not present

## 2012-11-04 DIAGNOSIS — C8589 Other specified types of non-Hodgkin lymphoma, extranodal and solid organ sites: Secondary | ICD-10-CM | POA: Insufficient documentation

## 2012-11-04 LAB — CBC WITH DIFFERENTIAL/PLATELET
Eosinophils Relative: 1 % (ref 0–5)
Lymphocytes Relative: 30 % (ref 12–46)
Lymphs Abs: 1.8 10*3/uL (ref 0.7–4.0)
MCV: 89 fL (ref 78.0–100.0)
Neutro Abs: 3.7 10*3/uL (ref 1.7–7.7)
Platelets: 254 10*3/uL (ref 150–400)
RBC: 4.54 MIL/uL (ref 3.87–5.11)
WBC: 6 10*3/uL (ref 4.0–10.5)

## 2012-11-04 LAB — PROTIME-INR
INR: 0.92 (ref 0.00–1.49)
Prothrombin Time: 12.3 seconds (ref 11.6–15.2)

## 2012-11-04 LAB — APTT: aPTT: 23 seconds — ABNORMAL LOW (ref 24–37)

## 2012-11-04 MED ORDER — FENTANYL CITRATE 0.05 MG/ML IJ SOLN
INTRAMUSCULAR | Status: AC
  Filled 2012-11-04: qty 6

## 2012-11-04 MED ORDER — MIDAZOLAM HCL 2 MG/2ML IJ SOLN
INTRAMUSCULAR | Status: AC
  Filled 2012-11-04: qty 6

## 2012-11-04 MED ORDER — MIDAZOLAM HCL 2 MG/2ML IJ SOLN
INTRAMUSCULAR | Status: AC | PRN
  Administered 2012-11-04: 1 mg via INTRAVENOUS
  Administered 2012-11-04: 2 mg via INTRAVENOUS

## 2012-11-04 MED ORDER — CEFAZOLIN SODIUM-DEXTROSE 2-3 GM-% IV SOLR
INTRAVENOUS | Status: AC
  Filled 2012-11-04: qty 100

## 2012-11-04 MED ORDER — SODIUM CHLORIDE 0.9 % IV SOLN: INTRAVENOUS | Status: DC

## 2012-11-04 MED ORDER — FENTANYL CITRATE 0.05 MG/ML IJ SOLN
INTRAMUSCULAR | Status: AC | PRN
  Administered 2012-11-04 (×2): 100 ug via INTRAVENOUS

## 2012-11-04 MED ORDER — CEFAZOLIN SODIUM-DEXTROSE 2-3 GM-% IV SOLR
2.0000 g | Freq: Once | INTRAVENOUS | Status: AC
  Administered 2012-11-04: 2 g via INTRAVENOUS

## 2012-11-04 NOTE — Procedures (Signed)
Successful removal of right anterior chest wall port-a-cath. No immediate post procedural complications.  

## 2012-11-04 NOTE — H&P (Signed)
Chief Complaint: "I'm here for my port removal" Referring Physician:Lisa Maisie Ray HPI: Kelly Ray is an 72 y.o. female with history of lymphoma. She had a port placed back in 2012 and completed treatment a year ago. She is now here for port removal. PMHx and meds reviewed, she is feeling well today.  Past Medical History:  Past Medical History  Diagnosis Date  . Thyroid disease     Hyperthyroidism  . Arthritis     Osteoarthritis  . Hypertension   . S/P knee replacement     Left knee  . B-cell lymphoma 05/04/11    Large B-cell lymphoma    Past Surgical History: Port-(L) 2012  Family History: History reviewed. No pertinent family history.  Social History:  reports that she has never smoked. She does not have any smokeless tobacco history on file. Her alcohol and drug histories are not on file.  Allergies: No Known Allergies  Medications: celecoxib (CELEBREX) 200 MG capsule (Taking) Sig - Route: Take 200 mg by mouth daily. - Oral Class: Historical Med Number of times this order has been changed since signing: 1 Order [address] lidocaine-prilocaine (EMLA) cream (Taking) 30 g 1 07/09/2012 Sig - Route: Apply 1 application topically as needed. - Topical Number of times this order has been changed since signing: 1 Order [address] pravastatin (PRAVACHOL) 20 MG tablet (Taking) Sig - Route: Take 20 mg by mouth daily. - Oral Class: Historical Med   Please HPI for pertinent positives, otherwise complete 10 system ROS negative.  Physical Exam: There were no vitals taken for this visit. There is no weight on file to calculate BMI.   General Appearance:  Alert, cooperative, no distress, appears stated age  Head:  Normocephalic, without obvious abnormality, atraumatic  ENT: Unremarkable  Neck: Supple, symmetrical, trachea midline, no adenopathy, thyroid: not enlarged, symmetric, no tenderness/mass/nodules  Lungs:   Clear to auscultation bilaterally, no w/r/r, respirations unlabored without  use of accessory muscles.  Chest Wall:  (L)port intact, NT, well healed scar.  Heart:  Regular rate and rhythm, S1, S2 normal, no murmur, rub or gallop. Carotids 2+ without bruit.  Neurologic: Normal affect, no gross deficits.   Results for orders placed during the hospital encounter of 11/04/12 (from the past 48 hour(s))  APTT     Status: Abnormal   Collection Time    11/04/12 10:00 AM      Result Value Range   aPTT 23 (*) 24 - 37 seconds  PROTIME-INR     Status: None   Collection Time    11/04/12 10:00 AM      Result Value Range   Prothrombin Time 12.3  11.6 - 15.2 seconds   INR 0.92  0.00 - 1.49   No results found.  Assessment/Plan Hx of lymphoma For port removal today. Discussed procedure, risks, complications. Labs pending. Consent signed in chart  Kelly El PA-C 11/04/2012, 10:39 AM

## 2012-11-07 DIAGNOSIS — Z96659 Presence of unspecified artificial knee joint: Secondary | ICD-10-CM | POA: Diagnosis not present

## 2012-11-12 ENCOUNTER — Other Ambulatory Visit (HOSPITAL_COMMUNITY): Payer: Self-pay | Admitting: Orthopedic Surgery

## 2012-11-12 DIAGNOSIS — M25562 Pain in left knee: Secondary | ICD-10-CM

## 2012-11-13 DIAGNOSIS — Z124 Encounter for screening for malignant neoplasm of cervix: Secondary | ICD-10-CM | POA: Diagnosis not present

## 2012-11-13 DIAGNOSIS — Z1212 Encounter for screening for malignant neoplasm of rectum: Secondary | ICD-10-CM | POA: Diagnosis not present

## 2012-11-24 ENCOUNTER — Encounter (HOSPITAL_COMMUNITY)
Admission: RE | Admit: 2012-11-24 | Discharge: 2012-11-24 | Disposition: A | Discharge status: Home / Self Care | Payer: Medicare Other | Source: Ambulatory Visit | Attending: Orthopedic Surgery | Admitting: Orthopedic Surgery

## 2012-11-24 DIAGNOSIS — M25569 Pain in unspecified knee: Secondary | ICD-10-CM | POA: Diagnosis not present

## 2012-11-24 DIAGNOSIS — M25562 Pain in left knee: Secondary | ICD-10-CM

## 2012-11-24 DIAGNOSIS — M1710 Unilateral primary osteoarthritis, unspecified knee: Secondary | ICD-10-CM | POA: Diagnosis not present

## 2012-11-24 MED ORDER — TECHNETIUM TC 99M MEDRONATE IV KIT
25.0000 | PACK | Freq: Once | INTRAVENOUS | Status: AC | PRN
  Administered 2012-11-24: 25 via INTRAVENOUS

## 2013-01-05 ENCOUNTER — Other Ambulatory Visit: Payer: Self-pay | Admitting: Physician Assistant

## 2013-01-28 ENCOUNTER — Other Ambulatory Visit: Payer: Self-pay | Admitting: Emergency Medicine

## 2013-01-28 ENCOUNTER — Ambulatory Visit (INDEPENDENT_AMBULATORY_CARE_PROVIDER_SITE_OTHER): Payer: Medicare Other | Admitting: Emergency Medicine

## 2013-01-28 VITALS — BP 130/70 | HR 63 | Temp 97.6°F | Resp 16 | Ht 62.5 in | Wt 199.2 lb

## 2013-01-28 DIAGNOSIS — E559 Vitamin D deficiency, unspecified: Secondary | ICD-10-CM | POA: Diagnosis not present

## 2013-01-28 DIAGNOSIS — R739 Hyperglycemia, unspecified: Secondary | ICD-10-CM

## 2013-01-28 DIAGNOSIS — E785 Hyperlipidemia, unspecified: Secondary | ICD-10-CM

## 2013-01-28 DIAGNOSIS — Z131 Encounter for screening for diabetes mellitus: Secondary | ICD-10-CM

## 2013-01-28 DIAGNOSIS — R7309 Other abnormal glucose: Secondary | ICD-10-CM

## 2013-01-28 DIAGNOSIS — E0590 Thyrotoxicosis, unspecified without thyrotoxic crisis or storm: Secondary | ICD-10-CM | POA: Diagnosis not present

## 2013-01-28 DIAGNOSIS — M810 Age-related osteoporosis without current pathological fracture: Secondary | ICD-10-CM

## 2013-01-28 DIAGNOSIS — I10 Essential (primary) hypertension: Secondary | ICD-10-CM

## 2013-01-28 LAB — POCT GLYCOSYLATED HEMOGLOBIN (HGB A1C): Hemoglobin A1C: 5.7

## 2013-01-28 LAB — COMPREHENSIVE METABOLIC PANEL
ALT: 11 U/L (ref 0–35)
AST: 17 U/L (ref 0–37)
Alkaline Phosphatase: 85 U/L (ref 39–117)
CO2: 28 mEq/L (ref 19–32)
Sodium: 139 mEq/L (ref 135–145)
Total Bilirubin: 0.6 mg/dL (ref 0.3–1.2)
Total Protein: 6.6 g/dL (ref 6.0–8.3)

## 2013-01-28 LAB — GLUCOSE, POCT (MANUAL RESULT ENTRY): POC Glucose: 87 mg/dl (ref 70–99)

## 2013-01-28 LAB — LIPID PANEL
LDL Cholesterol: 123 mg/dL — ABNORMAL HIGH (ref 0–99)
Total CHOL/HDL Ratio: 3.4 Ratio
VLDL: 21 mg/dL (ref 0–40)

## 2013-01-28 MED ORDER — LEVOTHYROXINE SODIUM 137 MCG PO TABS: 137.0000 ug | ORAL_TABLET | Freq: Every morning | ORAL | Status: DC

## 2013-01-28 MED ORDER — PRAVASTATIN SODIUM 20 MG PO TABS: 20.0000 mg | ORAL_TABLET | Freq: Every day | ORAL | Status: DC

## 2013-01-28 MED ORDER — LISINOPRIL-HYDROCHLOROTHIAZIDE 20-12.5 MG PO TABS: 1.0000 | ORAL_TABLET | Freq: Every morning | ORAL | Status: DC

## 2013-01-28 NOTE — Progress Notes (Signed)
  Subjective:    Patient ID: Terrea Bruster, female    DOB: [birth year], 72 y.o.   MRN: [id number]  HPI patient has a history of hypertension. Patient has a history of high cholesterol. She is under treatment for both of these problems. She sees Dr. Dalene Carrow who is at the cancer clinic for treatment of lymphoma. SS a history of hypothyroidism on replacement. She has been through [facility] and Dr. and she recommended a bone density study. She is scheduled for an upcoming mammogram.    Review of Systems     Objective:   Physical Exam HEENT exam is unremarkable. Neck supple except on the right side there is a 1 x 1 cm firm area adjacent to the right sternocleidomastoid. Chest is clear. Heart regular rate no murmurs. Abdomen soft nontender    Results for orders placed in visit on 01/28/13  GLUCOSE, POCT (MANUAL RESULT ENTRY)      Result Value Range   POC Glucose 87  70 - 99 mg/dl  POCT GLYCOSYLATED HEMOGLOBIN (HGB A1C)      Result Value Range   Hemoglobin A1C 5.7        Assessment & Plan:  Labs were drawn. meds refilled. bone density study was scheduled .

## 2013-01-29 LAB — VITAMIN D 25 HYDROXY (VIT D DEFICIENCY, FRACTURES): Vit D, 25-Hydroxy: 42 ng/mL (ref 30–89)

## 2013-02-10 DIAGNOSIS — Z780 Asymptomatic menopausal state: Secondary | ICD-10-CM | POA: Diagnosis not present

## 2013-02-10 DIAGNOSIS — Z1231 Encounter for screening mammogram for malignant neoplasm of breast: Secondary | ICD-10-CM | POA: Diagnosis not present

## 2013-02-19 ENCOUNTER — Encounter: Payer: Self-pay | Admitting: Emergency Medicine

## 2013-03-05 ENCOUNTER — Telehealth: Payer: Self-pay

## 2013-03-05 NOTE — Telephone Encounter (Signed)
Pt advised of labs and bone density results.

## 2013-03-05 NOTE — Telephone Encounter (Signed)
Pt states that she had a bone density test and additional labs that she has been waiting on for about a month now she stated. Best# [phone]

## 2013-03-05 NOTE — Telephone Encounter (Signed)
Bone density report received. Will bring it to your desk.

## 2013-03-05 NOTE — Telephone Encounter (Signed)
Unfortunately I do not have her bone density results. It was done at [facility], I have called for report, when it arrives, will you send to me please?

## 2013-04-23 ENCOUNTER — Telehealth: Payer: Self-pay | Admitting: Oncology

## 2013-04-23 NOTE — Telephone Encounter (Signed)
Returned pt's call re r/s 9/4 appt. Pt given new appt for 9/23.

## 2013-04-30 ENCOUNTER — Ambulatory Visit: Payer: Medicare Other | Admitting: Oncology

## 2013-05-19 ENCOUNTER — Telehealth: Payer: Self-pay | Admitting: Oncology

## 2013-05-19 ENCOUNTER — Ambulatory Visit (HOSPITAL_BASED_OUTPATIENT_CLINIC_OR_DEPARTMENT_OTHER): Payer: Medicare Other | Admitting: Oncology

## 2013-05-19 VITALS — BP 110/67 | HR 73 | Temp 97.6°F | Resp 19 | Ht 62.0 in | Wt 197.9 lb

## 2013-05-19 DIAGNOSIS — E039 Hypothyroidism, unspecified: Secondary | ICD-10-CM

## 2013-05-19 DIAGNOSIS — I10 Essential (primary) hypertension: Secondary | ICD-10-CM | POA: Diagnosis not present

## 2013-05-19 DIAGNOSIS — E7800 Pure hypercholesterolemia, unspecified: Secondary | ICD-10-CM | POA: Diagnosis not present

## 2013-05-19 DIAGNOSIS — C8510 Unspecified B-cell lymphoma, unspecified site: Secondary | ICD-10-CM

## 2013-05-19 DIAGNOSIS — C8589 Other specified types of non-Hodgkin lymphoma, extranodal and solid organ sites: Secondary | ICD-10-CM

## 2013-05-19 NOTE — Telephone Encounter (Signed)
gv adn printed appt sched and avs for pt for March 2015  °

## 2013-05-19 NOTE — Progress Notes (Signed)
   [facility]    OFFICE PROGRESS NOTE   INTERVAL HISTORY:   She returns as scheduled. She feels well. No fever, night sweats, or anorexia. Stable malaise since completing chemotherapy. She is active working in the yard. She plans to receive an influenza vaccine via her primary physician.  Objective:  Vital signs in last 24 hours:  Blood pressure 110/67, pulse 73, temperature 97.6 F (36.4 C), temperature source Oral, resp. rate 19, height 5\' 2"  (1.575 m), weight 197 lb 14.4 oz (89.767 kg).    HEENT: Neck without mass, oropharynx about visible mass Lymphatics: No cervical, supraclavicular, axillary, or inguinal nodes Resp: Lungs clear bilaterally Cardio: Regular rate and rhythm GI: No hepatosplenomegaly Vascular: No leg edema Skin: Left upper chest Port-A-Cath scar with mild keloid formation    Medications: I have reviewed the patient's current medications.  Assessment/Plan: 1. 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 Negative restaging CT evaluation 10/24/2012 2. Port-A-Cath placement 06/01/2011. Port-A-Cath removal 11/04/2012 3. Hypertension. 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 5. Hypercholesterolemia. 6. Osteoarthritis.   Disposition:  She remains in clinical remission from non-Hodgkin's lymphoma. Ms. Ace will return for an office visit in 6 months.   Thornton Papas, MD  05/19/2013  3:27 PM

## 2013-05-20 DIAGNOSIS — H902 Conductive hearing loss, unspecified: Secondary | ICD-10-CM | POA: Diagnosis not present

## 2013-05-20 DIAGNOSIS — H8000 Otosclerosis involving oval window, nonobliterative, unspecified ear: Secondary | ICD-10-CM | POA: Diagnosis not present

## 2013-05-20 DIAGNOSIS — H6120 Impacted cerumen, unspecified ear: Secondary | ICD-10-CM | POA: Diagnosis not present

## 2013-06-29 ENCOUNTER — Ambulatory Visit (INDEPENDENT_AMBULATORY_CARE_PROVIDER_SITE_OTHER): Payer: Medicare Other | Admitting: Emergency Medicine

## 2013-06-29 VITALS — HR 60 | Temp 97.6°F | Resp 16 | Ht 62.5 in | Wt 200.0 lb

## 2013-06-29 DIAGNOSIS — Z23 Encounter for immunization: Secondary | ICD-10-CM

## 2013-06-29 DIAGNOSIS — R229 Localized swelling, mass and lump, unspecified: Secondary | ICD-10-CM | POA: Diagnosis not present

## 2013-06-29 NOTE — Progress Notes (Signed)
This chart was scribed for Lesle Chris, MD by Joaquin Music, ED Scribe. This patient was seen in room Room 3 and the patient's care was started at 12:58 PM  Subjective:    Patient ID: Kelly Ray, female    DOB: [birth year], 72 y.o.   MRN: [id number] Chief Complaint  Patient presents with  . Rash    (R) leg    HPI Kelly Ray is a 72 y.o. Female with a hx of B-cell lymphoma who presents to the [facility] complaining of 2 skin lesions that are raised and hard present near R foot. Pt states 2 weeks ago, she was in the yard and tripped over a branch. She believes the area is cellulitis. She states she noticed them 2 weeks ago. Pt states vein on R foot was inflamed but has now gone down. She denies pain to the area. She states he show may hit the area but denies having pain then.  Patient Active Problem List   Diagnosis Date Noted  . B-cell lymphoma 07/17/2011   Review of Systems  Skin: Positive for color change and wound.    Triage Vitals:Pulse 60  Temp(Src) 97.6 F (36.4 C) (Oral)  Resp 16  Ht 5' 2.5" (1.588 m)  Wt 200 lb (90.719 kg)  BMI 35.97 kg/m2  SpO2 98%  Objective:   Physical Exam  There are 2 unusual lesions distal right leg. They are proximal to the Achilles area. One lesion measures 2 x 1 cm. The other lesion measures 1 x 1 cm. They have both firm to touch he had telangiectasia over them with a somewhat bluish U.      Assessment & Plan:  Patient presents with 2 lesions over the back of the right leg. She has a history of B-cell lymphoma. These could be related to her B-cell lymphoma. She is being referred to Dr. caffeine to have them biopsied.

## 2013-07-01 ENCOUNTER — Telehealth: Payer: Self-pay

## 2013-07-01 NOTE — Telephone Encounter (Signed)
Is it acceptable for her to wait until Dec 3rd for dermatology?

## 2013-07-01 NOTE — Telephone Encounter (Signed)
I called the patient told her I would see her Friday and do a punch biopsy

## 2013-07-01 NOTE — Telephone Encounter (Signed)
Pt is calling bc she wants to make sure that it is okay for her to wait till dec 3 to see Harriette Ohara for her referral, I tried to explain to her that scheduling an apt with the dermatologist most are booked out until beginng of next year. That she can call to see if there is any cancellations. I called before calling the pt to see if there was any and there was not any at the moment. She would like for Daub to give her a call back at [phone]

## 2013-07-02 NOTE — Telephone Encounter (Signed)
Patient called back, and she can get into a different dermatologist, Dr Hortense Ramal on 07/15/13 she wants to know if you still want to do the biopsy or if she should just wait until this appt. Please advise.

## 2013-07-03 NOTE — Telephone Encounter (Signed)
Called her to advise.  

## 2013-07-03 NOTE — Telephone Encounter (Signed)
Call [name] and tell her I think that appointment is much more reasonable. She does not need to see me she can just see the dermatologist and have her biopsy done there

## 2013-07-15 ENCOUNTER — Other Ambulatory Visit: Payer: Self-pay | Admitting: Dermatology

## 2013-07-15 DIAGNOSIS — B351 Tinea unguium: Secondary | ICD-10-CM | POA: Diagnosis not present

## 2013-07-15 DIAGNOSIS — L089 Local infection of the skin and subcutaneous tissue, unspecified: Secondary | ICD-10-CM | POA: Diagnosis not present

## 2013-07-15 DIAGNOSIS — D485 Neoplasm of uncertain behavior of skin: Secondary | ICD-10-CM | POA: Diagnosis not present

## 2013-07-15 DIAGNOSIS — B359 Dermatophytosis, unspecified: Secondary | ICD-10-CM | POA: Diagnosis not present

## 2013-07-15 DIAGNOSIS — C8589 Other specified types of non-Hodgkin lymphoma, extranodal and solid organ sites: Secondary | ICD-10-CM | POA: Diagnosis not present

## 2013-07-22 ENCOUNTER — Telehealth: Payer: Self-pay | Admitting: *Deleted

## 2013-07-22 NOTE — Telephone Encounter (Signed)
Per Dr. Truett Perna : Schedule for 12/1 at 2pm. Made patient aware that MD can see her 12/1 at 2pm-she agrees.

## 2013-07-22 NOTE — Telephone Encounter (Signed)
Lesions on leg are positive for her lymphoma return per dermatologist. Needs appointment. MD will review path and patient will be called with an appointment next week. Dr. Truett Perna will try to see her in the next couple weeks. Patient notified via voice mail.

## 2013-07-27 ENCOUNTER — Telehealth: Payer: Self-pay | Admitting: Oncology

## 2013-07-27 ENCOUNTER — Ambulatory Visit (HOSPITAL_BASED_OUTPATIENT_CLINIC_OR_DEPARTMENT_OTHER): Payer: Medicare Other | Admitting: Oncology

## 2013-07-27 ENCOUNTER — Ambulatory Visit (HOSPITAL_BASED_OUTPATIENT_CLINIC_OR_DEPARTMENT_OTHER): Payer: Medicare Other | Admitting: Lab

## 2013-07-27 ENCOUNTER — Telehealth: Payer: Self-pay | Admitting: Radiology

## 2013-07-27 VITALS — BP 118/63 | HR 79 | Temp 97.6°F | Resp 20 | Ht 62.5 in | Wt 200.6 lb

## 2013-07-27 DIAGNOSIS — C8589 Other specified types of non-Hodgkin lymphoma, extranodal and solid organ sites: Secondary | ICD-10-CM

## 2013-07-27 DIAGNOSIS — R5381 Other malaise: Secondary | ICD-10-CM | POA: Diagnosis not present

## 2013-07-27 DIAGNOSIS — R05 Cough: Secondary | ICD-10-CM | POA: Diagnosis not present

## 2013-07-27 DIAGNOSIS — I10 Essential (primary) hypertension: Secondary | ICD-10-CM | POA: Diagnosis not present

## 2013-07-27 DIAGNOSIS — C8510 Unspecified B-cell lymphoma, unspecified site: Secondary | ICD-10-CM

## 2013-07-27 DIAGNOSIS — R059 Cough, unspecified: Secondary | ICD-10-CM | POA: Diagnosis not present

## 2013-07-27 LAB — COMPREHENSIVE METABOLIC PANEL (CC13)
ALT: 15 U/L (ref 0–55)
AST: 19 U/L (ref 5–34)
Albumin: 3.9 g/dL (ref 3.5–5.0)
Alkaline Phosphatase: 95 U/L (ref 40–150)
Chloride: 103 mEq/L (ref 98–109)
Potassium: 4.8 mEq/L (ref 3.5–5.1)
Sodium: 142 mEq/L (ref 136–145)
Total Bilirubin: 0.29 mg/dL (ref 0.20–1.20)
Total Protein: 7.3 g/dL (ref 6.4–8.3)

## 2013-07-27 LAB — CBC WITH DIFFERENTIAL/PLATELET
BASO%: 0.1 % (ref 0.0–2.0)
EOS%: 1.5 % (ref 0.0–7.0)
MCH: 30.4 pg (ref 25.1–34.0)
MCV: 90.2 fL (ref 79.5–101.0)
MONO%: 6 % (ref 0.0–14.0)
RBC: 4.41 10*6/uL (ref 3.70–5.45)
RDW: 13.1 % (ref 11.2–14.5)
nRBC: 0 % (ref 0–0)

## 2013-07-27 LAB — LACTATE DEHYDROGENASE (CC13): LDH: 218 U/L (ref 125–245)

## 2013-07-27 NOTE — Progress Notes (Signed)
   [facility]    OFFICE PROGRESS NOTE   INTERVAL HISTORY:   Kelly Ray returns prior to a scheduled visit. She reports noting an erythematous lesion at the right lower leg beginning approximately 5 weeks ago. The lesion has enlarged and a second lesion appeared more superiorly. She saw Dr. Cleta Alberts and was referred for a biopsy with Dr. Jorja Loa. This confirmed involvement with a large B-cell lymphoma. 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 A dense infiltrate was present in the dermis. Scattered mitoses are seen and the infiltrate is not nodular. The infiltrate is diffuse. The majority of the cells were CD20 positive, CD 3 highlights background lymphocytes. The infiltrate is strongly positive for BCL-6, BCL 2, and CD10.  Kelly Ray has noted continued enlargement of the right lower leg lesion. There are a few "tiny "erythematous flat areas at the left lower leg. No other skin lesions.  Review of systems: Positives: Malaise for 2-3 weeks, nonproductive cough A complete review of systems was otherwise negative  Objective:  Vital signs in last 24 hours:  Blood pressure 118/63, pulse 79, temperature 97.6 F (36.4 C), temperature source Oral, resp. rate 20, height 5' 2.5" (1.588 m), weight 200 lb 9.6 oz (90.992 kg).    HEENT: Upper denture plate, oropharynx without visible mass, neck without mass Lymphatics: No cervical, supraclavicular, axillary, or inguinal nodes Resp: End inspiratory wheeze at the upper anterior and posterior chest, no respiratory distress Cardio: Regular rate and rhythm GI: No hepatosplenomegaly, nontender Vascular: No leg edema, bilateral lower leg varicosities. Neuro: Alert and oriented, the motor exam appears intact in the upper and lower extremities  Skin: 2-3 cm nodular lesion at the posterior right lower leg with evidence of recent bleeding. Superior to this lesion there is a 1 cm deep  red/purple nodular lesion. At the left lower leg there are 2 less than 1 cm slightly raised skin lesions.     Medications: I have reviewed the patient's current medications.  Assessment/Plan: 1. 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 Negative restaging CT evaluation 10/24/2012  Nodular skin lesions at the right lower leg, status post a shave biopsy 07/15/2013 confirming a malignant B-cell lymphoma, diffuse large cell positive for CD20, BCL 6, BCL 2, and CD10 2. Port-A-Cath placement 06/01/2011. Port-A-Cath removal 11/04/2012 3. Hypertension. 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 5. Hypercholesterolemia. 6. Osteoarthritis. 7. Malaise-? Secondary to progression of non-Hodgkin's lymphoma 8. Nonproductive cough   Disposition:  Kelly Ray has been diagnosed with B-cell lymphoma involving a right lower leg nodular skin lesion. She appears to have another focus of lymphoma proximal to the dominant mass and there are suspicious lesions at the left lower leg. I suspect the current lymphoma is related to the large B-cell lymphoma diagnosed in 2012.  I discussed the diagnosis and salvage treatment options with Kelly Ray and her husband. She will undergo a staging evaluation to include laboratory studies, a PET scan, and a bone marrow biopsy.  I will refer her to Dr. Greggory Stallion at [facility] to consider the indication for stem cell therapy. [name] will return for an office visit here on 08/05/2013.  Approximately 45 minutes were spent with patient today. The majority of the time was used for counseling and coordination of care.   Thornton Papas, MD  07/27/2013  2:22 PM

## 2013-07-27 NOTE — Telephone Encounter (Signed)
appts made per 12/1 POF AVS and CAL given pt adv NM and CT will call her w appts Cover sheet and referral for Dr Greggory Stallion taken to [facility] in [place]

## 2013-07-27 NOTE — Telephone Encounter (Signed)
CANCER IS BACK - GOING TO SEE DR. HURD.

## 2013-07-27 NOTE — Telephone Encounter (Signed)
Patient called back, she will see the Oncologist today at 2pm. To you FYI she will let us know if she needs anything.

## 2013-07-27 NOTE — Telephone Encounter (Signed)
Dr Cleta Alberts has called patient concerning her appt and results from the dermatologist.

## 2013-07-28 ENCOUNTER — Encounter (HOSPITAL_COMMUNITY): Payer: Self-pay | Admitting: Pharmacy Technician

## 2013-07-28 ENCOUNTER — Telehealth: Payer: Self-pay | Admitting: *Deleted

## 2013-07-28 NOTE — Telephone Encounter (Signed)
Call from [facility] with [facility] radiology scheduling. Unable to get pt in for PET until 12/16. Reviewed with Dr. Truett Perna, needs PET sooner. Worked in at [facility] for 07/30/13 @ 1215. Left message on voicemail for pt to call office for new appt.

## 2013-07-28 NOTE — Telephone Encounter (Signed)
Called pt with instructions to report to [facility] on 12/4 at 1215 to check in for PET. Gave her instructions to have a high protein meal prior to 6 AM. No sugar of any form for 6 hours prior to test. OK to drink water. Pt voiced understanding.

## 2013-07-28 NOTE — Telephone Encounter (Signed)
Left message for her to call

## 2013-07-28 NOTE — Telephone Encounter (Signed)
Call [name] and let her oh I received a notice from the oncologist. If she wants to come in and talk about her situation at any time I'm here and happy to see her .

## 2013-07-29 ENCOUNTER — Other Ambulatory Visit: Payer: Self-pay | Admitting: Radiology

## 2013-07-30 ENCOUNTER — Ambulatory Visit: Payer: Self-pay | Admitting: Oncology

## 2013-07-30 ENCOUNTER — Telehealth: Payer: Self-pay | Admitting: Oncology

## 2013-07-30 DIAGNOSIS — C8589 Other specified types of non-Hodgkin lymphoma, extranodal and solid organ sites: Secondary | ICD-10-CM | POA: Diagnosis not present

## 2013-07-30 NOTE — Telephone Encounter (Signed)
Pt appt with Dr. Dimas Aguas @ [facility] is 08/06/13@10 :00  Medical records faxed,slides and scans will be fedex'ed.  Pt is aware

## 2013-07-31 ENCOUNTER — Ambulatory Visit (HOSPITAL_COMMUNITY)
Admission: RE | Admit: 2013-07-31 | Discharge: 2013-07-31 | Disposition: A | Discharge status: Home / Self Care | Payer: Medicare Other | Source: Ambulatory Visit | Attending: Oncology | Admitting: Oncology

## 2013-07-31 ENCOUNTER — Encounter (HOSPITAL_COMMUNITY): Payer: Self-pay

## 2013-07-31 DIAGNOSIS — C8589 Other specified types of non-Hodgkin lymphoma, extranodal and solid organ sites: Secondary | ICD-10-CM | POA: Diagnosis not present

## 2013-07-31 DIAGNOSIS — D649 Anemia, unspecified: Secondary | ICD-10-CM | POA: Diagnosis not present

## 2013-07-31 DIAGNOSIS — Z79899 Other long term (current) drug therapy: Secondary | ICD-10-CM | POA: Insufficient documentation

## 2013-07-31 DIAGNOSIS — D704 Cyclic neutropenia: Secondary | ICD-10-CM | POA: Diagnosis not present

## 2013-07-31 DIAGNOSIS — C8510 Unspecified B-cell lymphoma, unspecified site: Secondary | ICD-10-CM

## 2013-07-31 DIAGNOSIS — E079 Disorder of thyroid, unspecified: Secondary | ICD-10-CM | POA: Diagnosis not present

## 2013-07-31 DIAGNOSIS — I10 Essential (primary) hypertension: Secondary | ICD-10-CM | POA: Diagnosis not present

## 2013-07-31 LAB — PROTIME-INR: Prothrombin Time: 12.4 seconds (ref 11.6–15.2)

## 2013-07-31 LAB — CBC
Hemoglobin: 13.1 g/dL (ref 12.0–15.0)
MCH: 30.3 pg (ref 26.0–34.0)
MCHC: 33.6 g/dL (ref 30.0–36.0)
WBC: 6.4 10*3/uL (ref 4.0–10.5)

## 2013-07-31 LAB — BONE MARROW EXAM

## 2013-07-31 MED ORDER — MIDAZOLAM HCL 2 MG/2ML IJ SOLN
INTRAMUSCULAR | Status: AC | PRN
  Administered 2013-07-31 (×3): 1 mg via INTRAVENOUS

## 2013-07-31 MED ORDER — SODIUM CHLORIDE 0.9 % IV SOLN
INTRAVENOUS | Status: DC
  Administered 2013-07-31: 08:00:00 via INTRAVENOUS

## 2013-07-31 MED ORDER — FENTANYL CITRATE 0.05 MG/ML IJ SOLN
INTRAMUSCULAR | Status: AC | PRN
  Administered 2013-07-31: 100 ug via INTRAVENOUS

## 2013-07-31 MED ORDER — FENTANYL CITRATE 0.05 MG/ML IJ SOLN
INTRAMUSCULAR | Status: AC
  Filled 2013-07-31: qty 4

## 2013-07-31 MED ORDER — MIDAZOLAM HCL 2 MG/2ML IJ SOLN
INTRAMUSCULAR | Status: AC
  Filled 2013-07-31: qty 4

## 2013-07-31 NOTE — Procedures (Signed)
Procedure:  CT guided right iliac bone marrow biopsy Findings:  Aspirate and core biopsy obtained.  No complications. 

## 2013-07-31 NOTE — H&P (Signed)
Agree.  For CT guided bone marrow biopsy today.  

## 2013-07-31 NOTE — H&P (Signed)
Chief Complaint: "I'm here for a bone marrow biopsy" Referring Physician:Sherrill HPI: Kelly Ray is an 72 y.o. female with hx of Lymphoma, initially diagnosed and treated 2 years ago. This has now recurred. She is scheduled for staging bone marrow biopsy. She had this procedure in 2012 and tolerated it well. PMHx and meds reviewed. She feels ok otherwise.  Past Medical History:  Past Medical History  Diagnosis Date  . Thyroid disease     Hyperthyroidism  . Arthritis     Osteoarthritis  . Hypertension   . S/P knee replacement     Left knee  . B-cell lymphoma 05/04/11    Large B-cell lymphoma    Past Surgical History: History reviewed. No pertinent past surgical history.  Family History:  Family History  Problem Relation Age of Onset  . Cancer Mother   . Heart attack Father     Social History:  reports that she has never smoked. She does not have any smokeless tobacco history on file. Her alcohol and drug histories are not on file.  Allergies: No Known Allergies  Medications:   Medication List    ASK your doctor about these medications       levothyroxine 137 MCG tablet  Commonly known as:  SYNTHROID, LEVOTHROID  Take 137 mcg by mouth every morning.     lisinopril-hydrochlorothiazide 20-12.5 MG per tablet  Commonly known as:  PRINZIDE,ZESTORETIC  Take 1 tablet by mouth every morning.     pravastatin 20 MG tablet  Commonly known as:  PRAVACHOL  Take 20 mg by mouth at bedtime.        Please HPI for pertinent positives, otherwise complete 10 system ROS negative.  Physical Exam: BP 136/67  Pulse 63  Temp(Src) 97.7 F (36.5 C) (Oral)  Resp 18  SpO2 96% There is no weight on file to calculate BMI.   General Appearance:  Alert, cooperative, no distress, appears stated age  Head:  Normocephalic, without obvious abnormality, atraumatic  ENT: Unremarkable  Neck: Supple, symmetrical, trachea midline  Lungs:   Clear to auscultation bilaterally, no w/r/r,  respirations unlabored without use of accessory muscles.  Chest Wall:  No tenderness or deformity  Heart:  Regular rate and rhythm, S1, S2 normal, no murmur, rub or gallop.  Neurologic: Normal affect, no gross deficits.   Results for orders placed during the hospital encounter of 07/31/13 (from the past 48 hour(s))  CBC     Status: None   Collection Time    07/31/13  7:20 AM      Result Value Range   WBC 6.4  4.0 - 10.5 K/uL   RBC 4.33  3.87 - 5.11 MIL/uL   Hemoglobin 13.1  12.0 - 15.0 g/dL   HCT 16.1  09.6 - 04.5 %   MCV 90.1  78.0 - 100.0 fL   MCH 30.3  26.0 - 34.0 pg   MCHC 33.6  30.0 - 36.0 g/dL   RDW 40.9  81.1 - 91.4 %   Platelets 244  150 - 400 K/uL  PROTIME-INR     Status: None   Collection Time    07/31/13  7:20 AM      Result Value Range   Prothrombin Time 12.4  11.6 - 15.2 seconds   INR 0.94  0.00 - 1.49   No results found.  Assessment/Plan Lymphoma Discussed CT guided bone marrow biopsy. Explained procedure, risks, complications, use of sedation. Labs reviewed. Consent signed in chart   [name] PA-C 07/31/2013, 8:22  AM     

## 2013-08-05 ENCOUNTER — Telehealth: Payer: Self-pay | Admitting: Oncology

## 2013-08-05 ENCOUNTER — Ambulatory Visit (HOSPITAL_BASED_OUTPATIENT_CLINIC_OR_DEPARTMENT_OTHER): Payer: Medicare Other | Admitting: Oncology

## 2013-08-05 VITALS — BP 124/71 | HR 70 | Temp 97.1°F | Resp 18 | Ht 62.5 in | Wt 202.2 lb

## 2013-08-05 DIAGNOSIS — C8510 Unspecified B-cell lymphoma, unspecified site: Secondary | ICD-10-CM

## 2013-08-05 DIAGNOSIS — R5381 Other malaise: Secondary | ICD-10-CM | POA: Diagnosis not present

## 2013-08-05 DIAGNOSIS — E039 Hypothyroidism, unspecified: Secondary | ICD-10-CM | POA: Diagnosis not present

## 2013-08-05 DIAGNOSIS — C8589 Other specified types of non-Hodgkin lymphoma, extranodal and solid organ sites: Secondary | ICD-10-CM

## 2013-08-05 DIAGNOSIS — I10 Essential (primary) hypertension: Secondary | ICD-10-CM | POA: Diagnosis not present

## 2013-08-05 NOTE — Progress Notes (Signed)
   [facility]    OFFICE PROGRESS NOTE   INTERVAL HISTORY:   Ms. Bozard returns for scheduled followup of lymphoma. She has noted an increase in size of the dominant right lower leg lesion with increased "oozing ". The lesion also bleeds.  She underwent a bone marrow biopsy on 07/31/2013. The pathology confirmed a normocellular marrow with no evidence of lymphoma. Flow cytometry revealed no monoclonal B-cell population.  A staging PET scan on 07/30/2013 revealed no hypermetabolic lymph nodes in the neck, chest, abdomen, or pelvis. No abnormal hypermetabolic activity in the liver or spleen. No skeletal metastases. No evidence of recurrent lymphoma. The PET scan included the skull base to the thigh.    Objective:  Vital signs in last 24 hours:  Blood pressure 124/71, pulse 70, temperature 97.1 F (36.2 C), temperature source Oral, resp. rate 18, height 5' 2.5" (1.588 m), weight 202 lb 3.2 oz (91.717 kg).     Resp: Lungs clear bilaterally Cardio: Regular rate and rhythm Skin: 3 cm nodular lesion at the medial right lower leg with overlying dried blood. Superior to the dominant lesion there is a 1-1.5 cm purplish nodule. At the left lower leg there are a few less than 1 cm slightly raised erythematous lesions     Lab Results:  Lab Results  Component Value Date   WBC 6.4 07/31/2013   HGB 13.1 07/31/2013   HCT 39.0 07/31/2013   MCV 90.1 07/31/2013   PLT 244 07/31/2013   ANC 4.7 on 07/27/2013 LDH 218 on 12 1014   Medications: I have reviewed the patient's current medications.  Assessment/Plan: 1. 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 Negative restaging CT evaluation 10/24/2012 Nodular skin lesions at the right lower leg, status post a shave biopsy 07/15/2013 confirming a malignant B-cell lymphoma, diffuse large cell positive for CD20, BCL 6, BCL 2, and  CD10 Staging bone marrow biopsy 07/31/2013, negative for involvement with lymphoma  Staging PET scan 07/30/2013-negative 2. Port-A-Cath placement 06/01/2011. Port-A-Cath removal 11/04/2012 3. Hypertension. 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 5. Hypercholesterolemia. 6. Osteoarthritis. 7. Malaise-? Secondary to progression of non-Hodgkin's lymphoma 8. Nonproductive cough   Disposition:  The skin lesions at the right lower leg appear to have progressed over the past week. She otherwise appears stable. The staging evaluation reveals no evidence for additional sites of lymphoma. A CBC and chemistry panel were unremarkable and the LDH is normal.  She is scheduled to see Dr. Dimas Aguas at [facility] on 08/06/2013 to consider salvage systemic treatment options. I think it is likely the current lymphoma represents progression of the lymphoma diagnosed in 2012. She may be a candidate for salvage systemic therapy followed by stem cell therapy. I asked her to have Dr. Dimas Aguas contact me tomorrow. She will return for an office visit 08/11/2013.  Thornton Papas, MD  08/05/2013  1:06 PM

## 2013-08-05 NOTE — Telephone Encounter (Signed)
gv and printed papt sched and avs for opt for DEC

## 2013-08-06 DIAGNOSIS — E079 Disorder of thyroid, unspecified: Secondary | ICD-10-CM | POA: Diagnosis not present

## 2013-08-06 DIAGNOSIS — I10 Essential (primary) hypertension: Secondary | ICD-10-CM | POA: Diagnosis not present

## 2013-08-06 DIAGNOSIS — E7800 Pure hypercholesterolemia, unspecified: Secondary | ICD-10-CM | POA: Diagnosis not present

## 2013-08-06 DIAGNOSIS — E039 Hypothyroidism, unspecified: Secondary | ICD-10-CM | POA: Insufficient documentation

## 2013-08-06 DIAGNOSIS — C8589 Other specified types of non-Hodgkin lymphoma, extranodal and solid organ sites: Secondary | ICD-10-CM | POA: Diagnosis not present

## 2013-08-06 DIAGNOSIS — Z9221 Personal history of antineoplastic chemotherapy: Secondary | ICD-10-CM | POA: Diagnosis not present

## 2013-08-11 ENCOUNTER — Ambulatory Visit (HOSPITAL_COMMUNITY): Payer: Medicare Other

## 2013-08-11 ENCOUNTER — Ambulatory Visit (HOSPITAL_BASED_OUTPATIENT_CLINIC_OR_DEPARTMENT_OTHER): Payer: Medicare Other | Admitting: Oncology

## 2013-08-11 VITALS — BP 103/69 | HR 71 | Temp 97.7°F | Resp 18 | Ht 62.0 in | Wt 199.9 lb

## 2013-08-11 DIAGNOSIS — I10 Essential (primary) hypertension: Secondary | ICD-10-CM

## 2013-08-11 DIAGNOSIS — C8589 Other specified types of non-Hodgkin lymphoma, extranodal and solid organ sites: Secondary | ICD-10-CM

## 2013-08-11 DIAGNOSIS — C8510 Unspecified B-cell lymphoma, unspecified site: Secondary | ICD-10-CM

## 2013-08-11 DIAGNOSIS — R5381 Other malaise: Secondary | ICD-10-CM

## 2013-08-11 NOTE — Progress Notes (Signed)
   [facility]    OFFICE PROGRESS NOTE   INTERVAL HISTORY:   She returns for scheduled followup of non-Hodgkin's lymphoma. She reports mild discomfort associated with the right lower leg skin lesion. She is not taking pain medication.  Ms. Normington saw Dr. Dimas Aguas on 08/06/2013. Dr. Dimas Aguas discussed treatment options with her including stem cell therapy. Ms. Barrasso is reluctant to consider inpatient chemotherapy or stem cell therapy.  Objective:  Vital signs in last 24 hours:  Blood pressure 103/69, pulse 71, temperature 97.7 F (36.5 C), temperature source Oral, resp. rate 18, height 5\' 2"  (1.575 m), weight 199 lb 14.4 oz (90.674 kg).   Resp: Lungs with mild wheezing at the upper posterior chest bilaterally, no respiratory distress Cardio: Regular rate and rhythm GI: No hepatosplenomegaly Vascular: No leg edema, bilateral low leg varicosities  Skin: 3 cm necrotic lesion at the right posterior lower leg. Superior to the dominant lesion there is a 1 cm nodular skin lesion. At the left lower leg there are 2 less than 1 cm slightly raised purplish lesions. At the posterior left lower leg there is a linear area of slightly raised erythema/purpura    Lab Results:  Lab Results  Component Value Date   WBC 6.4 07/31/2013   HGB 13.1 07/31/2013   HCT 39.0 07/31/2013   MCV 90.1 07/31/2013   PLT 244 07/31/2013      Medications: I have reviewed the patient's current medications.  Assessment/Plan: 1. 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 Negative restaging CT evaluation 10/24/2012 Nodular skin lesions at the right lower leg, status post a shave biopsy 07/15/2013 confirming a malignant B-cell lymphoma, diffuse large cell positive for CD20, BCL 6, BCL 2, and CD10 Staging bone marrow biopsy 07/31/2013, negative for involvement with lymphoma Staging PET scan  07/30/2013-negative 2. Port-A-Cath placement 06/01/2011. Port-A-Cath removal 11/04/2012 3. Hypertension. 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 5. Hypercholesterolemia. 6. Osteoarthritis. 7. Malaise-? Secondary to progression of non-Hodgkin's lymphoma   Disposition:  Ms. Quijas appears stable. I discussed treatment options with her again today. I discussed the case with Dr. Dimas Aguas. Dr. Dimas Aguas agrees Ms. Babic is a good candidate for salvage chemotherapy to be followed by stem cell therapy. She agrees with the R-ICE salvage regimen.  I discussed the R-ICE regimen with Ms. Lopp. She will not agree to inpatient chemotherapy and stated repeatedly that she does not wish to undergo stem cell therapy. I explained salvage chemotherapy in itself will not be curative. We discussed outpatient palliative chemotherapy. We specifically discussed the GDP regimen. We also discussed palliative radiation.  She understands radiation will not be curative and will treat only the right lower leg lesions. I explained the lymphoma is likely present systemically and will likely progress after palliative radiation. She stated that she would consider salvage chemotherapy if the lymphoma progresses after radiation.  Ms. Larranaga decided to proceed with radiation. I contacted Dr. Dayton Scrape and he will see Ms. Squibb on 08/12/2013. She will return for an office visit here on 09/03/2013. We will monitor her clinical status closely, specifically the questionable lesions at the left lower leg, and recommend salvage systemic chemotherapy for disease progression.   Thornton Papas, MD  08/11/2013  5:56 PM

## 2013-08-12 ENCOUNTER — Ambulatory Visit
Admission: RE | Admit: 2013-08-12 | Discharge: 2013-08-12 | Disposition: A | Discharge status: Home / Self Care | Payer: Medicare Other | Source: Ambulatory Visit | Attending: Radiation Oncology | Admitting: Radiation Oncology

## 2013-08-12 ENCOUNTER — Other Ambulatory Visit: Payer: Self-pay | Admitting: *Deleted

## 2013-08-12 ENCOUNTER — Encounter: Payer: Self-pay | Admitting: *Deleted

## 2013-08-12 VITALS — BP 123/62 | HR 74 | Temp 97.8°F | Resp 20 | Wt 198.9 lb

## 2013-08-12 DIAGNOSIS — L988 Other specified disorders of the skin and subcutaneous tissue: Secondary | ICD-10-CM | POA: Diagnosis not present

## 2013-08-12 DIAGNOSIS — I10 Essential (primary) hypertension: Secondary | ICD-10-CM | POA: Insufficient documentation

## 2013-08-12 DIAGNOSIS — Z510 Encounter for antineoplastic radiation therapy: Secondary | ICD-10-CM | POA: Insufficient documentation

## 2013-08-12 DIAGNOSIS — L539 Erythematous condition, unspecified: Secondary | ICD-10-CM | POA: Insufficient documentation

## 2013-08-12 DIAGNOSIS — C8589 Other specified types of non-Hodgkin lymphoma, extranodal and solid organ sites: Secondary | ICD-10-CM | POA: Insufficient documentation

## 2013-08-12 DIAGNOSIS — C8510 Unspecified B-cell lymphoma, unspecified site: Secondary | ICD-10-CM

## 2013-08-12 DIAGNOSIS — E079 Disorder of thyroid, unspecified: Secondary | ICD-10-CM | POA: Insufficient documentation

## 2013-08-12 DIAGNOSIS — Z79899 Other long term (current) drug therapy: Secondary | ICD-10-CM | POA: Diagnosis not present

## 2013-08-12 DIAGNOSIS — Z9221 Personal history of antineoplastic chemotherapy: Secondary | ICD-10-CM | POA: Insufficient documentation

## 2013-08-12 DIAGNOSIS — Z96659 Presence of unspecified artificial knee joint: Secondary | ICD-10-CM | POA: Insufficient documentation

## 2013-08-12 DIAGNOSIS — C801 Malignant (primary) neoplasm, unspecified: Secondary | ICD-10-CM | POA: Insufficient documentation

## 2013-08-12 HISTORY — DX: Malignant (primary) neoplasm, unspecified: C80.1

## 2013-08-12 NOTE — Progress Notes (Signed)
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 Negative restaging CT evaluation 10/24/2012 Pt unsure if she will pursue more chemotherapy.   Weight changes, if any, over the past 6 months: 10-11 lb weight gain since June 2014  Recurrent fevers, or drenching night sweats, if any: no  SAFETY ISSUES:  Prior radiation? no  Pacemaker/ICD? no  Possible current pregnancy? no  Is the patient on methotrexate? no  Current Complaints / other details: married, 2 children, retired Education officer, community. Lesion on back of right heel raised, dark red to purple in color, circular, scant amount bloody drainage. Pt states it was not painful until it began to bleed. She covers it with Vaseline and non adherent dressing to avoid irritation.

## 2013-08-12 NOTE — Progress Notes (Signed)
Please see the Nurse Progress Note in the MD Initial Consult Encounter for this patient. 

## 2013-08-12 NOTE — Progress Notes (Signed)
[facility] Radiation Oncology NEW PATIENT EVALUATION  Name: Kelly Ray MRN: [id number]  Date:   08/12/2013           DOB: 1940/11/03  Status: outpatient   CC: Lucilla Edin, MD  Ladene Artist, MD    REFERRING PHYSICIAN: Ladene Artist, MD   DIAGNOSIS: Recurrent malignant B-cell lymphoma (high-grade diffuse large B cell), right lower extremity   HISTORY OF PRESENT ILLNESS:  Kelly Ray is a 72 y.o. female who is seen today through the courtesy of Dr. Truett Perna for consideration of electron beam radiation therapy in the management of her recurrent malignant B-cell lymphoma with isolated involvement of the cutaneous right lower extremity.  She presented with stage II high-grade diffuse large B-cell lymphoma with initial involvement of her right neck and chest. She received 6 cycles of CHOP/R chemotherapy under the direction of Dr. Truett Perna. She had negative restaging by February of 2014. Dr.  Cleta Alberts recently noted nodular lesions involving the right lower leg with a shave biopsy by Dr. Jorja Loa on 07/15/2013 confirming a malignant B-cell lymphoma, diffuse large cell positive for CD20, and CD10. Restaging with bone marrow biopsy on December 5 and a PET scan on 07/30/2013 were negative for recurrent lymphoma.. The PET scan did not image the right lower extremity. She was seen at [facility] for discussion of bone marrow transplantation and also offered R-ICE salvage chemotherapy. She was refused bone marrow transplantation but is agreeable to localized radiation therapy followed by salvage chemotherapy. She seen today for evaluation and scheduling of radiation therapy. She denies B. symptoms.  PREVIOUS RADIATION THERAPY: No   PAST MEDICAL HISTORY:  has a past medical history of Thyroid disease; Arthritis; Hypertension; S/P knee replacement; B-cell lymphoma (05/04/11); and Cancer.     PAST SURGICAL HISTORY:  Past Surgical History  Procedure Laterality Date  . Total  knee arthroplasty Left     x 2  . Neck lesion biopsy Right 05/04/11    node biopsies     FAMILY HISTORY: family history includes Cancer in her brother and mother; Heart attack in her father. Her father died of a heart attack at age 25, in her mother died of bladder cancer at age 63. No family history of lymphoma.   SOCIAL HISTORY:  reports that she has never smoked. She does not have any smokeless tobacco history on file. She reports that she does not drink alcohol or use illicit drugs. Married, 3 children. 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   ALLERGIES: Review of patient's allergies indicates no known allergies.   MEDICATIONS:  Current Outpatient Prescriptions  Medication Sig Dispense Refill  . levothyroxine (SYNTHROID, LEVOTHROID) 137 MCG tablet Take 137 mcg by mouth every morning.      Marland Kitchen lisinopril-hydrochlorothiazide (PRINZIDE,ZESTORETIC) 20-12.5 MG per tablet Take 1 tablet by mouth every morning.      . pravastatin (PRAVACHOL) 20 MG tablet Take 20 mg by mouth at bedtime.       No current facility-administered medications for this encounter.     REVIEW OF SYSTEMS:  Pertinent items are noted in HPI.    PHYSICAL EXAM:  weight is 198 lb 14.4 oz (90.22 kg). Her oral temperature is 97.8 F (36.6 C). Her blood pressure is 123/62 and her pulse is 74. Her respiration is 20 and oxygen saturation is 99%.   Alert and oriented. Head and neck examination: Grossly unremarkable. Nodes: Without palpable cervical, supraclavicular, axillary lymphadenopathy. Chest:  Lungs clear. Heart: Regular rate rhythm. Abdomen: Without masses organomegaly. Extremities: There is a 3.5 x 2.5 cm hemorrhagic ulcerative mass along the posterior aspect of the distal lower extremity. Just above this is a flatter hemorrhagic mass measuring 1.5 by 1.5 cm which is not ulcerated. There are a few smaller erythematous macules along the anterior distal left lower extremity which may or may not  represent early cutaneous lymphoma. There is no lower extremity edema. Neurologic examination: Grossly nonfocal   LABORATORY DATA:  Lab Results  Component Value Date   WBC 6.4 07/31/2013   HGB 13.1 07/31/2013   HCT 39.0 07/31/2013   MCV 90.1 07/31/2013   PLT 244 07/31/2013   Lab Results  Component Value Date   NA 142 07/27/2013   K 4.8 07/27/2013   CL 101 01/28/2013   CO2 29 07/27/2013   Lab Results  Component Value Date   ALT 15 07/27/2013   AST 19 07/27/2013   ALKPHOS 95 07/27/2013   BILITOT 0.29 07/27/2013      IMPRESSION: Recurrent high-grade diffuse large B-cell lymphoma. I feel that she would benefit from a hyperfractionated course of radiation therapy to deliver 3600 cGy in 12 sessions. She will begin her simulation/treatment planning today and begin her radiation therapy on Monday, December 22. We discussed the potential acute and late toxicities of radiation therapy which should be well tolerated. Consent is signed today.   PLAN: As discussed above.  I spent 40 minutes minutes face to face with the patient and more than 50% of that time was spent in counseling and/or coordination of care.

## 2013-08-12 NOTE — Progress Notes (Signed)
Simulation note: The patient was placed prone on April. A Vac lock immobilization device was constructed to mobilize her lower extremities. She will undergo electron beam simulation tomorrow.

## 2013-08-13 ENCOUNTER — Ambulatory Visit
Admission: RE | Admit: 2013-08-13 | Discharge: 2013-08-13 | Disposition: A | Discharge status: Home / Self Care | Payer: Medicare Other | Source: Ambulatory Visit | Attending: Radiation Oncology | Admitting: Radiation Oncology

## 2013-08-13 ENCOUNTER — Telehealth: Payer: Self-pay | Admitting: Oncology

## 2013-08-13 DIAGNOSIS — C8510 Unspecified B-cell lymphoma, unspecified site: Secondary | ICD-10-CM

## 2013-08-13 NOTE — Telephone Encounter (Signed)
Email to Lonna Cobb to see if we can get pts final rad tx and her visit w LT in line with one another...  Will await reply via email...shh

## 2013-08-13 NOTE — Progress Notes (Signed)
Electron beam simulation/treatment planning note: The patient had construction of the VAC LOC immobilization device yesterday. She was placed prone on the LINAC table today. The cancer was angled LPO. We set up to her right lower leg. One custom block is constructed to shield the normal surrounding structures. A special port plan is requested. I prescribing 3600 cGy in 12 sessions utilizing 9 MEV electrons. 0.8 cm custom bolus will be constructed in apply to her skin on the first day of her treatment, December 22.

## 2013-08-14 ENCOUNTER — Telehealth: Payer: Self-pay | Admitting: Nurse Practitioner

## 2013-08-14 NOTE — Telephone Encounter (Signed)
received response from Lonna Cobb indicating the only thing she can do is see pt at 11:15 am.  Appt made and pt notified shh

## 2013-08-17 ENCOUNTER — Ambulatory Visit
Admission: RE | Admit: 2013-08-17 | Discharge: 2013-08-17 | Disposition: A | Discharge status: Home / Self Care | Payer: Medicare Other | Source: Ambulatory Visit | Attending: Radiation Oncology | Admitting: Radiation Oncology

## 2013-08-17 ENCOUNTER — Encounter: Payer: Self-pay | Admitting: Radiation Oncology

## 2013-08-17 VITALS — BP 124/81 | HR 66 | Temp 98.1°F | Resp 20 | Wt 199.8 lb

## 2013-08-17 DIAGNOSIS — C8510 Unspecified B-cell lymphoma, unspecified site: Secondary | ICD-10-CM

## 2013-08-17 NOTE — Progress Notes (Signed)
Weekly Management Note:  Site: Right lower leg Current Dose:  300  cGy Projected Dose: 3600  cGy  Narrative: The patient is seen today for routine under treatment assessment. CBCT/MVCT images/port films were reviewed. The chart was reviewed.   She is without complaints today. She be slight change in a small erythematous region just superior and to the left of her treatment field.  Physical Examination:  Filed Vitals:   08/17/13 1307  BP: 124/81  Pulse: 66  Temp: 98.1 F (36.7 C)  Resp: 20  .  Weight: 199 lb 12.8 oz (90.629 kg). She appears to have a small area of folliculitis just superior and medial to her treatment field.  Impression: Tolerating radiation therapy well. I will extend the field approximately 1 cm to cover the small area of what I believe is folliculitis rather than lymphoma.  Plan: Continue radiation therapy as planned.

## 2013-08-17 NOTE — Progress Notes (Signed)
Post sim ed completed w/pt. Gave pt "Radiation and You" booklet w/all pertinent information marked and discussed, re: skin irritation/care, pain. Pt verbalized understanding. Pt states she has occasional shooting, sharp pains in her right lower leg, behind her ankle. She keep dry dressing on this area.

## 2013-08-18 ENCOUNTER — Ambulatory Visit
Admission: RE | Admit: 2013-08-18 | Discharge: 2013-08-18 | Disposition: A | Discharge status: Home / Self Care | Payer: Medicare Other | Source: Ambulatory Visit | Attending: Radiation Oncology | Admitting: Radiation Oncology

## 2013-08-19 ENCOUNTER — Ambulatory Visit
Admission: RE | Admit: 2013-08-19 | Discharge: 2013-08-19 | Disposition: A | Discharge status: Home / Self Care | Payer: Medicare Other | Source: Ambulatory Visit | Attending: Radiation Oncology | Admitting: Radiation Oncology

## 2013-08-21 ENCOUNTER — Ambulatory Visit
Admission: RE | Admit: 2013-08-21 | Discharge: 2013-08-21 | Disposition: A | Discharge status: Home / Self Care | Payer: Medicare Other | Source: Ambulatory Visit | Attending: Radiation Oncology | Admitting: Radiation Oncology

## 2013-08-24 ENCOUNTER — Ambulatory Visit
Admission: RE | Admit: 2013-08-24 | Discharge: 2013-08-24 | Disposition: A | Discharge status: Home / Self Care | Payer: Medicare Other | Source: Ambulatory Visit | Attending: Radiation Oncology | Admitting: Radiation Oncology

## 2013-08-24 ENCOUNTER — Encounter (HOSPITAL_COMMUNITY): Payer: Self-pay

## 2013-08-25 ENCOUNTER — Ambulatory Visit
Admission: RE | Admit: 2013-08-25 | Discharge: 2013-08-25 | Disposition: A | Discharge status: Home / Self Care | Payer: Medicare Other | Source: Ambulatory Visit | Attending: Radiation Oncology | Admitting: Radiation Oncology

## 2013-08-25 ENCOUNTER — Encounter: Payer: Self-pay | Admitting: Radiation Oncology

## 2013-08-25 VITALS — BP 115/71 | HR 59 | Temp 97.8°F | Resp 20 | Wt 199.5 lb

## 2013-08-25 DIAGNOSIS — C8510 Unspecified B-cell lymphoma, unspecified site: Secondary | ICD-10-CM

## 2013-08-25 DIAGNOSIS — C8589 Other specified types of non-Hodgkin lymphoma, extranodal and solid organ sites: Secondary | ICD-10-CM | POA: Diagnosis not present

## 2013-08-25 NOTE — Progress Notes (Signed)
   Department of Radiation Oncology  Phone:  [phone] Fax:        [phone]  Weekly Treatment Note    Name: Kelly Ray Date: 08/25/2013 MRN: [id number] DOB: 05-31-1941   Current dose: 18 Gy  Current fraction: 6   MEDICATIONS: Current Outpatient Prescriptions  Medication Sig Dispense Refill  . levothyroxine (SYNTHROID, LEVOTHROID) 137 MCG tablet Take 137 mcg by mouth every morning.      Marland Kitchen lisinopril-hydrochlorothiazide (PRINZIDE,ZESTORETIC) 20-12.5 MG per tablet Take 1 tablet by mouth every morning.      . pravastatin (PRAVACHOL) 20 MG tablet Take 20 mg by mouth at bedtime.       No current facility-administered medications for this encounter.     ALLERGIES: Review of patient's allergies indicates no known allergies.   LABORATORY DATA:  Lab Results  Component Value Date   WBC 6.4 07/31/2013   HGB 13.1 07/31/2013   HCT 39.0 07/31/2013   MCV 90.1 07/31/2013   PLT 244 07/31/2013   Lab Results  Component Value Date   NA 142 07/27/2013   K 4.8 07/27/2013   CL 101 01/28/2013   CO2 29 07/27/2013   Lab Results  Component Value Date   ALT 15 07/27/2013   AST 19 07/27/2013   ALKPHOS 95 07/27/2013   BILITOT 0.29 07/27/2013     NARRATIVE: Kelly Ray was seen today for weekly treatment management. The chart was checked and the patient's films were reviewed. The patient states that she is doing very well. She has noticed a significant decrease in the tumor and this has dried up. She is very pleased with the response so far.  PHYSICAL EXAMINATION: weight is 199 lb 8 oz (90.493 kg). Her temperature is 97.8 F (36.6 C). Her blood pressure is 115/71 and her pulse is 59. Her respiration is 20.      tumor present in the right posterior lower extremity inferiorly. No real significant erythema in this area. Excellent response so far as described.  ASSESSMENT: The patient is doing satisfactorily with treatment.  PLAN: We will continue with the patient's radiation treatment  as planned.

## 2013-08-25 NOTE — Progress Notes (Signed)
Pt denies pain but states "every now and then I get a tingling pain in my heel". She denies loss of appetite, fatigue. Pt's right lower leg/heel area is dry, smaller than last week.

## 2013-08-26 ENCOUNTER — Ambulatory Visit
Admission: RE | Admit: 2013-08-26 | Discharge: 2013-08-26 | Disposition: A | Discharge status: Home / Self Care | Payer: Medicare Other | Source: Ambulatory Visit | Attending: Radiation Oncology | Admitting: Radiation Oncology

## 2013-08-28 ENCOUNTER — Ambulatory Visit
Admission: RE | Admit: 2013-08-28 | Discharge: 2013-08-28 | Disposition: A | Discharge status: Home / Self Care | Payer: Medicare Other | Source: Ambulatory Visit | Attending: Radiation Oncology | Admitting: Radiation Oncology

## 2013-08-28 DIAGNOSIS — C8589 Other specified types of non-Hodgkin lymphoma, extranodal and solid organ sites: Secondary | ICD-10-CM | POA: Diagnosis not present

## 2013-08-28 DIAGNOSIS — Z510 Encounter for antineoplastic radiation therapy: Secondary | ICD-10-CM | POA: Diagnosis not present

## 2013-08-28 DIAGNOSIS — L539 Erythematous condition, unspecified: Secondary | ICD-10-CM | POA: Diagnosis not present

## 2013-08-28 DIAGNOSIS — Z79899 Other long term (current) drug therapy: Secondary | ICD-10-CM | POA: Diagnosis not present

## 2013-08-31 ENCOUNTER — Other Ambulatory Visit: Payer: Self-pay | Admitting: Emergency Medicine

## 2013-08-31 ENCOUNTER — Ambulatory Visit
Admission: RE | Admit: 2013-08-31 | Discharge: 2013-08-31 | Disposition: A | Discharge status: Home / Self Care | Payer: Medicare Other | Source: Ambulatory Visit | Attending: Radiation Oncology | Admitting: Radiation Oncology

## 2013-08-31 VITALS — BP 137/68 | HR 59 | Temp 97.7°F | Wt 200.5 lb

## 2013-08-31 DIAGNOSIS — C8510 Unspecified B-cell lymphoma, unspecified site: Secondary | ICD-10-CM

## 2013-08-31 NOTE — Progress Notes (Signed)
Patient here for weekly assessment of radiation to right lower posterior leg.Area has decreased in size and not draining as much.Mild pain at times.Keeps wrapped at bedtime with non-adherent bandage.

## 2013-08-31 NOTE — Progress Notes (Signed)
Weekly Management Note:  Site: Right lower leg Current Dose:  2700  cGy Projected Dose: 3600  cGy  Narrative: The patient is seen today for routine under treatment assessment. CBCT/MVCT images/port films were reviewed. The chart was reviewed.   She is without complaints today. She continues to have tumor regression. She will finish her treatment this Thursday, see Dr. Benay Spice on Thursday as well.  Physical Examination:  Filed Vitals:   08/31/13 0939  BP: 137/68  Pulse: 59  Temp: 97.7 F (36.5 C)  .  Weight: 200 lb 8 oz (90.946 kg). There is flattening of her previously noted mass with a hemorrhagic crust. There is minimal serosanguineous drainage.  Impression: Tolerating radiation therapy well. Satisfactory progress with excellent response. I instructed her to apply Neosporin ointment as long as there is some drainage. I will check her wound when she finishes this Thursday.  Plan: Continue radiation therapy as planned. One-month followup visit after completion of radiation therapy. Followup with Dr. Benay Spice this Thursday.

## 2013-09-01 ENCOUNTER — Ambulatory Visit
Admission: RE | Admit: 2013-09-01 | Discharge: 2013-09-01 | Disposition: A | Discharge status: Home / Self Care | Payer: Medicare Other | Source: Ambulatory Visit | Attending: Radiation Oncology | Admitting: Radiation Oncology

## 2013-09-02 ENCOUNTER — Ambulatory Visit
Admission: RE | Admit: 2013-09-02 | Discharge: 2013-09-02 | Disposition: A | Discharge status: Home / Self Care | Payer: Medicare Other | Source: Ambulatory Visit | Attending: Radiation Oncology | Admitting: Radiation Oncology

## 2013-09-03 ENCOUNTER — Telehealth: Payer: Self-pay | Admitting: Oncology

## 2013-09-03 ENCOUNTER — Ambulatory Visit (HOSPITAL_BASED_OUTPATIENT_CLINIC_OR_DEPARTMENT_OTHER): Payer: Medicare Other | Admitting: Nurse Practitioner

## 2013-09-03 ENCOUNTER — Other Ambulatory Visit: Payer: Self-pay

## 2013-09-03 ENCOUNTER — Ambulatory Visit
Admission: RE | Admit: 2013-09-03 | Discharge: 2013-09-03 | Disposition: A | Discharge status: Home / Self Care | Payer: Medicare Other | Source: Ambulatory Visit | Attending: Radiation Oncology | Admitting: Radiation Oncology

## 2013-09-03 ENCOUNTER — Ambulatory Visit: Payer: Medicare Other | Admitting: Nurse Practitioner

## 2013-09-03 VITALS — BP 119/71 | HR 71 | Temp 97.4°F | Resp 19 | Ht 62.0 in | Wt 199.5 lb

## 2013-09-03 DIAGNOSIS — I10 Essential (primary) hypertension: Secondary | ICD-10-CM

## 2013-09-03 DIAGNOSIS — E039 Hypothyroidism, unspecified: Secondary | ICD-10-CM | POA: Diagnosis not present

## 2013-09-03 DIAGNOSIS — C8510 Unspecified B-cell lymphoma, unspecified site: Secondary | ICD-10-CM

## 2013-09-03 DIAGNOSIS — C8595 Non-Hodgkin lymphoma, unspecified, lymph nodes of inguinal region and lower limb: Secondary | ICD-10-CM

## 2013-09-03 MED ORDER — LEVOTHYROXINE SODIUM 137 MCG PO TABS: ORAL_TABLET | ORAL | Status: DC

## 2013-09-03 NOTE — Telephone Encounter (Signed)
Exp scr sent req for clar of directions on levythy Rx (it only said "PATIENT NEEDS OFFICE VISIT FOR ADDITIONAL REFILLS"). Resending w/correct sig.

## 2013-09-03 NOTE — Telephone Encounter (Signed)
appts made per 1/8 POF AVS and CAL given shh °

## 2013-09-03 NOTE — Progress Notes (Addendum)
OFFICE PROGRESS NOTE  Interval history:  Kelly Ray returns for scheduled followup of non-Hodgkin's lymphoma. She completed the course of palliative radiation to the right lower leg earlier today. She notes some discomfort which she attributes to "radiation burn". She has a good appetite. Her weight is stable. She denies fevers and sweats. No shortness of breath. No bowel or bladder problems.   Objective: Filed Vitals:   09/03/13 1115  BP: 119/71  Pulse: 71  Temp: 97.4 F (36.3 C)  Resp: 19   No thrush or ulcerations. No palpable cervical, supraclavicular or axillary lymph nodes. Lungs are clear. No wheezes or rales. Regular cardiac rhythm. Abdomen soft and nontender. No organomegaly. No leg edema. Approximate 3 cm superficially ulcerated skin lesion with surrounding erythema/mild induration right lower leg. Above the larger lesion there is a smaller, approximate 1 cm, purple skin lesion. At the left lower leg there are 2 approximate half centimeter pink/purple skin lesions. The lower skin lesion is slightly raised and the upper skin lesion is dry appearing and flat.   Lab Results: Lab Results  Component Value Date   WBC 6.4 07/31/2013   HGB 13.1 07/31/2013   HCT 39.0 07/31/2013   MCV 90.1 07/31/2013   PLT 244 07/31/2013   NEUTROABS 4.7 07/27/2013    Chemistry:    Chemistry      Component Value Date/Time   NA 142 07/27/2013 1549   NA 139 01/28/2013 0909   NA 159* 02/08/2012 0931   K 4.8 07/27/2013 1549   K 5.3 01/28/2013 0909   K 5.4* 02/08/2012 0931   CL 101 01/28/2013 0909   CL 103 10/24/2012 0859   CL 102 02/08/2012 0931   CO2 29 07/27/2013 1549   CO2 28 01/28/2013 0909   CO2 32 02/08/2012 0931   BUN 27.5* 07/27/2013 1549   BUN 24* 01/28/2013 0909   BUN 17 02/08/2012 0931   CREATININE 0.8 07/27/2013 1549   CREATININE 0.71 01/28/2013 0909   CREATININE 0.63 10/12/2011 0851      Component Value Date/Time   CALCIUM 9.4 07/27/2013 1549   CALCIUM 8.7 01/28/2013 0909   CALCIUM 8.1 02/08/2012 0931    ALKPHOS 95 07/27/2013 1549   ALKPHOS 85 01/28/2013 0909   ALKPHOS 78 02/08/2012 0931   AST 19 07/27/2013 1549   AST 17 01/28/2013 0909   AST 27 02/08/2012 0931   ALT 15 07/27/2013 1549   ALT 11 01/28/2013 0909   ALT 20 02/08/2012 0931   BILITOT 0.29 07/27/2013 1549   BILITOT 0.6 01/28/2013 0909   BILITOT 0.80 02/08/2012 0931       Studies/Results: No results found.  Medications: I have reviewed the patient's current medications.  Assessment/Plan: 1. 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 Negative restaging CT evaluation 10/24/2012 Nodular skin lesions at the right lower leg, status post a shave biopsy 07/15/2013 confirming a malignant B-cell lymphoma, diffuse large cell positive for CD20, BCL 6, BCL 2, and CD10  Staging bone marrow biopsy 07/31/2013, negative for involvement with lymphoma  Staging PET scan 07/30/2013-negative. Status post palliative radiation right lower leg nodular skin lesions 08/17/2013 through 09/03/2013. 2. Port-A-Cath placement 06/01/2011. Port-A-Cath removal 11/04/2012 3. Hypertension. 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 5. Hypercholesterolemia. 6. Osteoarthritis. 7. Malaise-? Secondary to progression of non-Hodgkin's lymphoma.  Dispositon-she appears stable. She has completed the course of palliative radiation to the biopsy-proven non-Hodgkin's lymphoma nodular skin lesions at the right lower leg.  Dr. Benay Spice again discussed salvage chemotherapy to be followed by stem cell therapy. She continues to decline this treatment option. She understands the lymphoma will likely progress at some point. She will consider salvage chemotherapy with evidence of progression.  She will return for a followup visit in 2 months. She understands to contact the office in the interim with any problems. We specifically discussed fevers, sweats, loss of appetite, rapid weight  loss.   Patient seen with Dr. Benay Spice.    Ned Card ANP/GNP-BC   This was a shared visit with Ned Card. The nodular skin lesions at the right lower leg have responded to palliative radiation. We discussed the indication for chemotherapy again today. Kelly Ray does not wish to consider high-dose chemotherapy with autologous stem cell support. She understands standard "salvage "systemic therapy will not be curative. The plan is to consider salvage treatment options when there is evidence of disease progression.  Julieanne Manson, M.D.

## 2013-09-04 ENCOUNTER — Encounter: Payer: Self-pay | Admitting: Radiation Oncology

## 2013-09-04 ENCOUNTER — Other Ambulatory Visit: Payer: Self-pay

## 2013-09-04 MED ORDER — LEVOTHYROXINE SODIUM 137 MCG PO TABS: ORAL_TABLET | ORAL | Status: DC

## 2013-09-04 NOTE — Progress Notes (Signed)
[facility] End of Treatment Note  Name:Carol Mirely Pangle  Date: 09/04/2013 WUJ:[id number] DOB:Jan 29, 1941   Status:outpatient    CC: [name], MD  Dr. Julieanne Manson  REFERRING PHYSICIAN:  Dr. Julieanne Manson    DIAGNOSIS:  Recurrent malignant B cell lymphoma (high grade diffuse large B cell) right lower extremity  INDICATION FOR TREATMENT: Palliative   TREATMENT DATES: 08/17/2013 through 09/03/2013                          SITE/DOSE: Posterior right lower leg,  3600 cGy in 12 sessions                         BEAMS/ENERGY:    9 MEV electrons, en face with 0.8 cm bolus to maximize the dose to the skin surface               NARRATIVE:   Ms. Losey tolerated treatment well with marked regression of her tumor by completion of therapy. I also treated an adjacent lesions, suspicious for lymphoma.                         PLAN: Routine followup in one month. Patient instructed to call if questions or worsening complaints in interim.

## 2013-09-04 NOTE — Telephone Encounter (Signed)
Dr Everlene Farrier, pharm reqs RF of synthroid and pt is due for f/up on this med, but saw you in Nov for other issue. I can only authorize 30 days, but 90 day RF would need to be sent to mail order. Is it

## 2013-09-04 NOTE — Telephone Encounter (Signed)
Is it OK to RF this for 90 days? I have pended it.

## 2013-09-11 ENCOUNTER — Telehealth: Payer: Self-pay

## 2013-09-11 NOTE — Telephone Encounter (Signed)
Patient called to inquire about drainage of treatment area of right lower extremity.Changing dressing and applying neosporin daily and prn according to drainage.Informed this is normal post completion of radiation.Increased pain when up and about, takes aleve.Told patient not to over do it and when sitting to elevate foot.Patient knows she may call back if any other problems or concerns.

## 2013-09-16 ENCOUNTER — Telehealth: Payer: Self-pay

## 2013-09-16 NOTE — Telephone Encounter (Signed)
Patient called with question about if it is normal to have pain in treatment field and continued drainage.Informed that sharp shooting pain is norma healing process,Patient has no fever and there has been a decrease in drainage.Informed that she should see improvement ofver the next week and she may call me next week to update me on her status.Patient reassured and thankful for the return call.

## 2013-09-30 ENCOUNTER — Encounter: Payer: Self-pay | Admitting: *Deleted

## 2013-10-01 ENCOUNTER — Ambulatory Visit
Admission: RE | Admit: 2013-10-01 | Discharge: 2013-10-01 | Disposition: A | Discharge status: Home / Self Care | Payer: Medicare Other | Source: Ambulatory Visit | Attending: Radiation Oncology | Admitting: Radiation Oncology

## 2013-10-01 ENCOUNTER — Ambulatory Visit: Payer: Medicare Other

## 2013-10-01 ENCOUNTER — Encounter: Payer: Self-pay | Admitting: Radiation Oncology

## 2013-10-01 VITALS — BP 136/81 | HR 68 | Temp 98.3°F | Resp 20 | Wt 205.0 lb

## 2013-10-01 DIAGNOSIS — C8589 Other specified types of non-Hodgkin lymphoma, extranodal and solid organ sites: Secondary | ICD-10-CM | POA: Diagnosis not present

## 2013-10-01 DIAGNOSIS — C8510 Unspecified B-cell lymphoma, unspecified site: Secondary | ICD-10-CM

## 2013-10-01 MED ORDER — CEPHALEXIN 500 MG PO CAPS: 500.0000 mg | ORAL_CAPSULE | Freq: Four times a day (QID) | ORAL | Status: DC

## 2013-10-01 NOTE — Progress Notes (Signed)
CC: Dr. Nena Jordan, Dr. Julieanne Manson  Followup note: Ms. Nielson visits today approximately 1 month following completion of radiotherapy to her posterior right lower leg in the management of her recurrent malignant B-cell lymphoma. She had excellent regression during her course of therapy. She called me earlier this week to tell me that her treatment site was draining and uncomfortable. She's not had a fever.  Physical examination: Alert and oriented. Filed Vitals:   10/01/13 1353  BP: 136/81  Pulse: 68  Temp: 98.3 F (36.8 C)  Resp: 20   On inspection of the right lower leg there is a residual superficial ulcer measuring 2.5 x 1.5 cm. There is yellowish drainage and surrounding erythema which is quite tender to palpation. Of note is that she brings to my attention two other erythematous superficial papules along the distal left lower extremity. The anterior lesion measures 0.8 x 0.8 cm in the posterior lesion measures 1.0 x 1.0 cm. The posterior lesion has a central follicule and this may be folliculitis. Neither are ulcerated.  Impression: I suspect that she has a wound infection along her posterior distal leg centered along her cutaneous lesion. I perform a wound culture, and I will get him started on cephalexin 500 mg by mouth 4 times a day for 10 days. I'll monitor her left lower extremity lesions.  Plan: As above. We'll await cultures, I'll see her back for her scheduled followup visit this coming Tuesday.

## 2013-10-01 NOTE — Progress Notes (Addendum)
Pt states 2 weeks ago she began having shooting pains in her right ankle. She states the pain has progressively gotten worse. She states she tried elevating her foot, but her pain did not decrease. She is taking Aleve 2 tabs BID. She denies fever, redness, warmth. Her right ankle has  A reddish, purple mottled appearance today w/slight edema. No streaks noted. Pt states she has noticed "yellow drainage" from the area where she received radiation. Pt states the pain does not go beyond her ankle area. Pt also concerned about new lesion on her posterior left calf which she states "is growing".   Offered pt wheelchair when she got off elevator; she refused.

## 2013-10-04 LAB — WOUND CULTURE

## 2013-10-05 ENCOUNTER — Telehealth: Payer: Self-pay

## 2013-10-05 NOTE — Telephone Encounter (Signed)
Pt requesting refill for her Lisinopril sent to [facility],she is undergoing radiation therapy for cancer And has developed a staph infection so she cannot come in for office visit.   Best phone for pt is [phone]   Ph[facility]

## 2013-10-06 ENCOUNTER — Ambulatory Visit: Payer: Medicare Other | Admitting: Radiation Oncology

## 2013-10-06 ENCOUNTER — Ambulatory Visit
Admission: RE | Admit: 2013-10-06 | Discharge: 2013-10-06 | Disposition: A | Discharge status: Home / Self Care | Payer: Medicare Other | Source: Ambulatory Visit | Attending: Radiation Oncology | Admitting: Radiation Oncology

## 2013-10-06 ENCOUNTER — Encounter: Payer: Self-pay | Admitting: Radiation Oncology

## 2013-10-06 NOTE — Progress Notes (Signed)
Followup note:  Kelly Ray is seen today after being seen last Thursday for development of a staph infection at the side of her treated malignant B-cell lymphoma involving the skin. Her wound was cultured and grew staph. I started her on cephalexin which she will take for a total of 10 days. She is still tender along her left heel/Achilles tendon.  Physical examination: There is certainly less drainage and less erythema surrounding her healing/granulating ulcer along the right lower leg.  Impression: Clinically improved.  Plan: Followup visit with me in 2 weeks.

## 2013-10-07 MED ORDER — LISINOPRIL-HYDROCHLOROTHIAZIDE 20-12.5 MG PO TABS: 1.0000 | ORAL_TABLET | Freq: Every morning | ORAL | Status: DC

## 2013-10-07 NOTE — Telephone Encounter (Signed)
Sent in Rx and notified pt who agreed to RTC as soon as her infection clears up.

## 2013-10-07 NOTE — Telephone Encounter (Signed)
Dr Everlene Farrier, is it ok to refill for 90 days at mail order, or just 30 days? Pended for 90.

## 2013-10-07 NOTE — Telephone Encounter (Signed)
90 days is fine 

## 2013-10-16 ENCOUNTER — Telehealth: Payer: Self-pay | Admitting: Oncology

## 2013-10-16 NOTE — Telephone Encounter (Signed)
Talked to pt and gave him appt for March  r/s appt due to MD's PAL °

## 2013-10-20 ENCOUNTER — Encounter: Payer: Self-pay | Admitting: Radiation Oncology

## 2013-10-20 ENCOUNTER — Ambulatory Visit
Admission: RE | Admit: 2013-10-20 | Discharge: 2013-10-20 | Disposition: A | Discharge status: Home / Self Care | Payer: Medicare Other | Source: Ambulatory Visit | Attending: Radiation Oncology | Admitting: Radiation Oncology

## 2013-10-20 VITALS — BP 126/80 | HR 81 | Temp 97.7°F | Resp 20 | Wt 205.0 lb

## 2013-10-20 DIAGNOSIS — C8510 Unspecified B-cell lymphoma, unspecified site: Secondary | ICD-10-CM

## 2013-10-20 NOTE — Progress Notes (Signed)
Pt completed Keflex. She states she occasionally has pain in back of her left heel which is relieved w/Aleve prn. Her left heel area is dry, flakey, w/scabbed area, no redness, warmth.

## 2013-10-20 NOTE — Progress Notes (Signed)
Followup note:  Kelly Ray returns today approximately 6 weeks following completion of that time he radiation therapy in the management of her recurrent malignant B-cell lymphoma involving the posterior right lower leg. Following her radiation therapy she developed a staph infection for which I placed her on Keflex for 10 days. She is much improved. She has some residual discomfort along her Achilles tendon.  Physical examination: Alert and oriented. Filed Vitals:   10/20/13 1036  BP: 126/80  Pulse: 81  Temp: 97.7 F (36.5 C)  Resp: 20   On inspection of the right lower extremity there is no longer any erythema. There is a residual 1.0 cm superficial scab. There is no active infection. The previously noted lesions seen along the left lower extremity, both anteriorly and posteriorly are essentially unchanged.  Impression: Satisfactory progress with resolution of her soft tissue infection along the right lower extremity. There is no active disease along her left lower extremity.  Plan: She'll see Dr. Benay Ray March 10. I've not scheduled the patient for a formal followup visit, but I would be more than happy to see her in the future should the need arise.

## 2013-11-03 ENCOUNTER — Ambulatory Visit: Payer: Medicare Other | Admitting: Oncology

## 2013-11-10 ENCOUNTER — Ambulatory Visit: Payer: Medicare Other | Admitting: Nurse Practitioner

## 2013-11-11 ENCOUNTER — Other Ambulatory Visit: Payer: Self-pay | Admitting: *Deleted

## 2013-11-11 ENCOUNTER — Encounter: Payer: Medicare Other | Admitting: Oncology

## 2013-11-11 ENCOUNTER — Ambulatory Visit: Payer: Medicare Other | Admitting: Oncology

## 2013-11-11 ENCOUNTER — Telehealth: Payer: Self-pay | Admitting: Oncology

## 2013-11-11 NOTE — Progress Notes (Signed)
Per Dr. Benay Spice, reschedule for 3/27 in am with NP.

## 2013-11-11 NOTE — Progress Notes (Signed)
   [facility]   

## 2013-11-11 NOTE — Telephone Encounter (Signed)
Pt came by @ 1:20 pm appt was 10 am today, pt insisted to put her with ML, emailed Dr. Benay Spice and waiting for a response from MD

## 2013-11-11 NOTE — Telephone Encounter (Signed)
Talked to pt's husband and gave him appt with Lattie Haw on 3/27

## 2013-11-16 ENCOUNTER — Ambulatory Visit: Payer: Medicare Other | Admitting: Nurse Practitioner

## 2013-11-20 ENCOUNTER — Ambulatory Visit (HOSPITAL_BASED_OUTPATIENT_CLINIC_OR_DEPARTMENT_OTHER): Payer: BC Managed Care – PPO | Admitting: Nurse Practitioner

## 2013-11-20 VITALS — BP 144/82 | HR 64 | Temp 98.0°F | Resp 20 | Ht 62.0 in | Wt 206.7 lb

## 2013-11-20 DIAGNOSIS — M159 Polyosteoarthritis, unspecified: Secondary | ICD-10-CM | POA: Diagnosis not present

## 2013-11-20 DIAGNOSIS — I10 Essential (primary) hypertension: Secondary | ICD-10-CM | POA: Diagnosis not present

## 2013-11-20 DIAGNOSIS — C8510 Unspecified B-cell lymphoma, unspecified site: Secondary | ICD-10-CM

## 2013-11-20 DIAGNOSIS — E7800 Pure hypercholesterolemia, unspecified: Secondary | ICD-10-CM | POA: Diagnosis not present

## 2013-11-20 DIAGNOSIS — E039 Hypothyroidism, unspecified: Secondary | ICD-10-CM | POA: Diagnosis not present

## 2013-11-20 DIAGNOSIS — C8595 Non-Hodgkin lymphoma, unspecified, lymph nodes of inguinal region and lower limb: Secondary | ICD-10-CM

## 2013-11-20 NOTE — Progress Notes (Addendum)
  [facility] OFFICE PROGRESS NOTE   Diagnosis:  Non-Hodgkin's lymphoma.  INTERVAL HISTORY:   Ms. Lingenfelter returns for scheduled followup. She feels well. She reports significant improvement in the right lower leg lesions. No fevers or sweats. She has a good appetite and good energy level.  She reports completing treatment for a staph infection involving the skin at the right lower leg in February of this year.  Objective:  Vital signs in last 24 hours:  Blood pressure 144/82, pulse 64, temperature 98 F (36.7 C), temperature source Oral, resp. rate 20, height $RemoveBe'5\' 2"'bjrByYfpY$  (1.575 m), weight 206 lb 11.2 oz (93.759 kg), SpO2 100.00%.    HEENT: No thrush or ulcerations. Lymphatics: No palpable cervical, supraclavicular, axillary or inguinal lymph nodes. Resp: Lungs clear. No wheezes or rales. Cardio: Regular cardiac rhythm. GI: Abdomen is soft and nontender. No organomegaly. Vascular: No leg edema.  Skin: At the right lower posterior leg there is an approximate 2 x 1 cm linear raised purplish discolored lesion. Superior to that lesion is a 1 cm round flat purplish discolored lesion. At the left lower anterior leg there is a 1 cm round slightly raised erythematous lesion. More superior there is a smaller flat pink lesion.    Lab Results:  Lab Results  Component Value Date   WBC 6.4 07/31/2013   HGB 13.1 07/31/2013   HCT 39.0 07/31/2013   MCV 90.1 07/31/2013   PLT 244 07/31/2013   NEUTROABS 4.7 07/27/2013    Imaging:  No results found.  Medications: I have reviewed the patient's current medications.  Assessment/Plan: 1. 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 Negative restaging CT evaluation 10/24/2012 Nodular skin lesions at the right lower leg, status post a shave biopsy 07/15/2013 confirming a malignant B-cell lymphoma, diffuse large cell positive for CD20, BCL 6,  BCL 2, and CD10  Staging bone marrow biopsy 07/31/2013, negative for involvement with lymphoma  Staging PET scan 07/30/2013-negative.  Status post palliative radiation right lower leg nodular skin lesions 08/17/2013 through 09/03/2013. 2. Port-A-Cath placement 06/01/2011. Port-A-Cath removal 11/04/2012 3. Hypertension. 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 5. Hypercholesterolemia. 6. Osteoarthritis. 7. Malaise-? Secondary to progression of non-Hodgkin's lymphoma. Improved. 8. Staph infection right lower leg February 2015.   Disposition: She appears stable. The skin lesions are significantly improved following radiation. We will continue to follow on an observation approach with routine office visits with plans to initiate salvage chemotherapy with evidence of progression.  She will return for a followup visit in 4 months. She will contact the office in the interim with any problems.   Patient seen with Dr. Benay Spice.    Ned Card ANP/GNP-BC   11/20/2013  11:51 AM  This was a shared visit with Ned Card. There is no clinical evidence for progression of the lymphoma. The right leg lesions responded to radiation.  Julieanne Manson, M.D.

## 2013-11-24 ENCOUNTER — Telehealth: Payer: Self-pay | Admitting: Oncology

## 2013-11-24 NOTE — Telephone Encounter (Signed)
LMONVM FOR PT RE APPT FOR 7/27. SCHEDULE MAILED.

## 2013-12-28 ENCOUNTER — Ambulatory Visit (INDEPENDENT_AMBULATORY_CARE_PROVIDER_SITE_OTHER): Payer: Medicare Other | Admitting: Emergency Medicine

## 2013-12-28 VITALS — BP 126/74 | HR 65 | Temp 98.1°F | Resp 14 | Ht 62.0 in | Wt 204.4 lb

## 2013-12-28 DIAGNOSIS — E039 Hypothyroidism, unspecified: Secondary | ICD-10-CM

## 2013-12-28 DIAGNOSIS — I10 Essential (primary) hypertension: Secondary | ICD-10-CM | POA: Diagnosis not present

## 2013-12-28 DIAGNOSIS — E785 Hyperlipidemia, unspecified: Secondary | ICD-10-CM | POA: Diagnosis not present

## 2013-12-28 LAB — TSH: TSH: 2.114 u[IU]/mL (ref 0.350–4.500)

## 2013-12-28 LAB — COMPREHENSIVE METABOLIC PANEL
ALK PHOS: 72 U/L (ref 39–117)
ALT: 10 U/L (ref 0–35)
AST: 17 U/L (ref 0–37)
Albumin: 4.1 g/dL (ref 3.5–5.2)
BILIRUBIN TOTAL: 0.7 mg/dL (ref 0.2–1.2)
BUN: 31 mg/dL — AB (ref 6–23)
CO2: 28 meq/L (ref 19–32)
Calcium: 9.2 mg/dL (ref 8.4–10.5)
Chloride: 100 mEq/L (ref 96–112)
Creat: 0.81 mg/dL (ref 0.50–1.10)
GLUCOSE: 96 mg/dL (ref 70–99)
Potassium: 5 mEq/L (ref 3.5–5.3)
Sodium: 137 mEq/L (ref 135–145)
Total Protein: 6.9 g/dL (ref 6.0–8.3)

## 2013-12-28 LAB — POCT CBC
Granulocyte percent: 59.5 %G (ref 37–80)
HEMATOCRIT: 43.1 % (ref 37.7–47.9)
HEMOGLOBIN: 13.8 g/dL (ref 12.2–16.2)
LYMPH, POC: 2.1 (ref 0.6–3.4)
MCH, POC: 29.9 pg (ref 27–31.2)
MCHC: 32 g/dL (ref 31.8–35.4)
MCV: 93.5 fL (ref 80–97)
MID (cbc): 0.4 (ref 0–0.9)
MPV: 9.9 fL (ref 0–99.8)
POC GRANULOCYTE: 3.7 (ref 2–6.9)
POC LYMPH PERCENT: 33.5 %L (ref 10–50)
POC MID %: 7 % (ref 0–12)
Platelet Count, POC: 279 10*3/uL (ref 142–424)
RBC: 4.61 M/uL (ref 4.04–5.48)
RDW, POC: 13.9 %
WBC: 6.2 10*3/uL (ref 4.6–10.2)

## 2013-12-28 LAB — LIPID PANEL
CHOL/HDL RATIO: 3 ratio
CHOLESTEROL: 233 mg/dL — AB (ref 0–200)
HDL: 77 mg/dL (ref >39)
LDL Cholesterol: 132 mg/dL — ABNORMAL HIGH (ref 0–99)
TRIGLYCERIDES: 118 mg/dL (ref <150)
VLDL: 24 mg/dL (ref 0–40)

## 2013-12-28 LAB — T4, FREE: Free T4: 1.27 ng/dL (ref 0.80–1.80)

## 2013-12-28 MED ORDER — LISINOPRIL-HYDROCHLOROTHIAZIDE 20-12.5 MG PO TABS: 1.0000 | ORAL_TABLET | Freq: Every morning | ORAL | Status: DC

## 2013-12-28 MED ORDER — LEVOTHYROXINE SODIUM 137 MCG PO TABS: ORAL_TABLET | ORAL | Status: DC

## 2013-12-28 MED ORDER — PRAVASTATIN SODIUM 20 MG PO TABS: 20.0000 mg | ORAL_TABLET | Freq: Every day | ORAL | Status: DC

## 2013-12-28 NOTE — Progress Notes (Signed)
   Subjective:    Patient ID: Kelly Ray, female    DOB: 03/27/1941, 73 y.o.   MRN: [id number]  HPI 73 y.o. Female presents to clinic this morning for refills of medication.   Had radiation for lymphoma, per pt doctor is watching spots on her legs to see if she needs chemo again. Reports that they were thinking about doing stem cell.    Review of Systems     Objective:   Physical Exam operative she's not ill appearing. Her neck is supple without adenopathy. Her chest is clear to both auscultation and percussion. Examinations of the axillary areas and inguinal areas are normal. There is no splenomegaly noted on examination.  Results for orders placed in visit on 12/28/13  POCT CBC      Result Value Ref Range   WBC 6.2  4.6 - 10.2 K/uL   Lymph, poc 2.1  0.6 - 3.4   POC LYMPH PERCENT 33.5  10 - 50 %L   MID (cbc) 0.4  0 - 0.9   POC MID % 7.0  0 - 12 %M   POC Granulocyte 3.7  2 - 6.9   Granulocyte percent 59.5  37 - 80 %G   RBC 4.61  4.04 - 5.48 M/uL   Hemoglobin 13.8  12.2 - 16.2 g/dL   HCT, POC 43.1  37.7 - 47.9 %   MCV 93.5  80 - 97 fL   MCH, POC 29.9  27 - 31.2 pg   MCHC 32.0  31.8 - 35.4 g/dL   RDW, POC 13.9     Platelet Count, POC 279  142 - 424 K/uL   MPV 9.9  0 - 99.8 fL        Assessment & Plan:  Patient looks good. All labs were done. Meds were refilled

## 2014-02-22 DIAGNOSIS — Z1231 Encounter for screening mammogram for malignant neoplasm of breast: Secondary | ICD-10-CM | POA: Diagnosis not present

## 2014-03-08 ENCOUNTER — Encounter: Payer: Self-pay | Admitting: Emergency Medicine

## 2014-03-22 ENCOUNTER — Ambulatory Visit: Payer: Medicare Other | Admitting: Oncology

## 2014-03-22 ENCOUNTER — Telehealth: Payer: Self-pay | Admitting: Oncology

## 2014-03-22 NOTE — Telephone Encounter (Signed)
PT LMONVM TO CX APPT TODAY AND SHE WOULD CALL BACK TO R/S - DESK NURSE INFORMED. RETURNED CALL AND WAS NOT ABLE TO REACH PT AT HOME/CELL OR LM - HOME NO VM PICKED UP AND CELL VM FULL.

## 2014-06-23 ENCOUNTER — Other Ambulatory Visit: Payer: Self-pay | Admitting: Nurse Practitioner

## 2015-01-13 ENCOUNTER — Other Ambulatory Visit: Payer: Self-pay | Admitting: Emergency Medicine

## 2015-01-13 NOTE — Telephone Encounter (Signed)
According to notes that I see, she has not been seen nor labs drawn more than a year ago.  I will give her a 30 day supply, but she needs to return for an annual physical or med refill within those 30 days.  We need to make sure her TSH is wnl as well as other labs.  Please ask her if she would like this sent to a pharmacy nearby before we send it, because this is her mail order pharmacy last listed.

## 2015-01-13 NOTE — Telephone Encounter (Signed)
CAN A 90 DAY REFILL BE AUTHORIZED?

## 2015-01-14 ENCOUNTER — Other Ambulatory Visit: Payer: Self-pay

## 2015-01-14 MED ORDER — LEVOTHYROXINE SODIUM 137 MCG PO TABS: ORAL_TABLET | ORAL | Status: DC

## 2015-01-14 NOTE — Telephone Encounter (Signed)
Pt will com ein to be seen. 30 day supply sent.

## 2015-01-20 ENCOUNTER — Telehealth: Payer: Self-pay

## 2015-01-20 NOTE — Telephone Encounter (Signed)
Please advise on refill.

## 2015-01-20 NOTE — Telephone Encounter (Signed)
Patient needs a 30 days supply of lisinopril and we sent the wrong medication to the pharmacy. Please resend the lisinopril!

## 2015-01-21 MED ORDER — LISINOPRIL-HYDROCHLOROTHIAZIDE 20-12.5 MG PO TABS: 1.0000 | ORAL_TABLET | Freq: Every morning | ORAL | Status: DC

## 2015-01-21 NOTE — Telephone Encounter (Signed)
Pt called back and explained that she was told by [place] that she needs OV for more RFs and will come in next week to see Dr Everlene Farrier when he returns but needs 30 day RF of lisinopril sent to local pharm. Done.

## 2015-01-26 ENCOUNTER — Ambulatory Visit (INDEPENDENT_AMBULATORY_CARE_PROVIDER_SITE_OTHER): Payer: Medicare Other

## 2015-01-26 ENCOUNTER — Ambulatory Visit (INDEPENDENT_AMBULATORY_CARE_PROVIDER_SITE_OTHER): Payer: Medicare Other | Admitting: Emergency Medicine

## 2015-01-26 VITALS — BP 120/82 | HR 64 | Temp 98.1°F | Resp 18 | Ht 62.5 in | Wt 200.4 lb

## 2015-01-26 DIAGNOSIS — E785 Hyperlipidemia, unspecified: Secondary | ICD-10-CM

## 2015-01-26 DIAGNOSIS — M810 Age-related osteoporosis without current pathological fracture: Secondary | ICD-10-CM

## 2015-01-26 DIAGNOSIS — C8580 Other specified types of non-Hodgkin lymphoma, unspecified site: Secondary | ICD-10-CM | POA: Diagnosis not present

## 2015-01-26 DIAGNOSIS — E039 Hypothyroidism, unspecified: Secondary | ICD-10-CM

## 2015-01-26 DIAGNOSIS — C8590 Non-Hodgkin lymphoma, unspecified, unspecified site: Secondary | ICD-10-CM

## 2015-01-26 DIAGNOSIS — R530 Neoplastic (malignant) related fatigue: Secondary | ICD-10-CM

## 2015-01-26 LAB — COMPREHENSIVE METABOLIC PANEL
ALBUMIN: 3.9 g/dL (ref 3.5–5.2)
ALT: 11 U/L (ref 0–35)
AST: 15 U/L (ref 0–37)
Alkaline Phosphatase: 103 U/L (ref 39–117)
BILIRUBIN TOTAL: 0.6 mg/dL (ref 0.2–1.2)
BUN: 19 mg/dL (ref 6–23)
CALCIUM: 9.3 mg/dL (ref 8.4–10.5)
CO2: 31 meq/L (ref 19–32)
Chloride: 101 mEq/L (ref 96–112)
Creat: 0.71 mg/dL (ref 0.50–1.10)
GLUCOSE: 100 mg/dL — AB (ref 70–99)
Potassium: 5.3 mEq/L (ref 3.5–5.3)
SODIUM: 141 meq/L (ref 135–145)
Total Protein: 6.7 g/dL (ref 6.0–8.3)

## 2015-01-26 LAB — LIPID PANEL
Cholesterol: 224 mg/dL — ABNORMAL HIGH (ref 0–200)
HDL: 75 mg/dL (ref >=46)
LDL CALC: 129 mg/dL — AB (ref 0–99)
TRIGLYCERIDES: 102 mg/dL (ref <150)
Total CHOL/HDL Ratio: 3 Ratio
VLDL: 20 mg/dL (ref 0–40)

## 2015-01-26 LAB — POCT CBC
Granulocyte percent: 57.5 %G (ref 37–80)
HCT, POC: 44 % (ref 37.7–47.9)
Hemoglobin: 13.8 g/dL (ref 12.2–16.2)
LYMPH, POC: 2.5 (ref 0.6–3.4)
MCH, POC: 28.1 pg (ref 27–31.2)
MCHC: 31.4 g/dL — AB (ref 31.8–35.4)
MCV: 89.5 fL (ref 80–97)
MID (cbc): 0.4 (ref 0–0.9)
MPV: 8.2 fL (ref 0–99.8)
POC Granulocyte: 3.9 (ref 2–6.9)
POC LYMPH PERCENT: 37.1 %L (ref 10–50)
POC MID %: 5.4 %M (ref 0–12)
Platelet Count, POC: 286 10*3/uL (ref 142–424)
RBC: 4.92 M/uL (ref 4.04–5.48)
RDW, POC: 15.2 %
WBC: 6.8 10*3/uL (ref 4.6–10.2)

## 2015-01-26 LAB — TSH: TSH: 0.11 u[IU]/mL — ABNORMAL LOW (ref 0.350–4.500)

## 2015-01-26 MED ORDER — LISINOPRIL-HYDROCHLOROTHIAZIDE 20-12.5 MG PO TABS: 1.0000 | ORAL_TABLET | Freq: Every morning | ORAL | Status: DC

## 2015-01-26 MED ORDER — LEVOTHYROXINE SODIUM 137 MCG PO TABS: ORAL_TABLET | ORAL | Status: DC

## 2015-01-26 NOTE — Progress Notes (Addendum)
Subjective:    Patient ID: Kelly Ray, female    DOB: 1941/08/13, 74 y.o.   MRN: [id number] This chart was scribed for Darlyne Russian, MD by Martinique Peace, ED Scribe. The patient was seen in [facility]02. The patient's care was started at 9:42 AM.  Chief Complaint  Patient presents with  . Medication Refill    levothyroxine and lisinopril     HPI  HPI Comments: Kelly Ray is a 74 y.o. female who presents to the [facility] seeking medication refill of Levothyroxine and Lisinopril. Pt reports that she was told she also needs another bone density test performed so she is requesting a referral. History of Osteoarthritis.    History of Cancer- Pt reports she has to go back in September to check for possible re-occurrence of cancer. Pt does not currently have a cancer physician that she follows up with regularly because her previous doctor has retired. Pt adds that she has been more fatigued recently stemming from what she believes is her chemotherapy and radiation treatments. She also notes she has been experiencing intermittent SOB. No complaints of chest pain. She further denies any trouble sleeping at night or notion that she may have sleep apnea.    Past Medical History  Diagnosis Date  . Thyroid disease     Hyperthyroidism  . Arthritis     Osteoarthritis  . Hypertension   . S/P knee replacement     Left knee  . B-cell lymphoma 05/04/11    Large B-cell lymphoma  . Cancer     NHL  . Hx of radiation therapy 08/17/13- 09/03/13    right lower extremity- lymphoma   Past Surgical History  Procedure Laterality Date  . Total knee arthroplasty Left     x 2  . Neck lesion biopsy Right 05/04/11    node biopsies   No Known Allergies  Current Outpatient Prescriptions on File Prior to Visit  Medication Sig Dispense Refill  . levothyroxine (SYNTHROID, LEVOTHROID) 137 MCG tablet Take one tablet every morning 30 tablet 0  . lisinopril-hydrochlorothiazide (PRINZIDE,ZESTORETIC) 20-12.5 MG per  tablet Take 1 tablet by mouth every morning. 30 tablet 0  . naproxen sodium (ANAPROX) 220 MG tablet Take 220 mg by mouth as needed.    . pravastatin (PRAVACHOL) 20 MG tablet Take 1 tablet (20 mg total) by mouth at bedtime. (Patient not taking: Reported on 01/26/2015) 90 tablet 3   No current facility-administered medications on file prior to visit.     Review of Systems  Constitutional: Positive for fatigue.  Respiratory: Positive for shortness of breath.   Cardiovascular: Negative for chest pain.  Psychiatric/Behavioral: Negative for sleep disturbance.       Objective:   Physical Exam  Constitutional: She is oriented to Ray, place, and time. She appears well-developed and well-nourished. No distress.  HENT:  Head: Normocephalic and atraumatic.  Eyes: Conjunctivae and EOM are normal.  Neck: Neck supple. No tracheal deviation present.  Cardiovascular: Normal rate.   Pulmonary/Chest: Effort normal. No respiratory distress.  Musculoskeletal: Normal range of motion.  Neurological: She is alert and oriented to Ray, place, and time.  Skin: Skin is warm and dry.  There are 3 1 x 1 cm scaly patches over the left shin.  Psychiatric: She has a normal mood and affect. Her behavior is normal.  Nursing note and vitals reviewed.  Filed Vitals:   01/26/15 0832  BP: 120/82  Pulse: 64  Temp: 98.1 F (36.7 C)  Resp: 18  [facility] reading (PRIMARY) by  Dr. Everlene Farrier no acute disease    9:48 AM- Treatment plan was discussed with patient who verbalizes understanding and agrees.       Assessment & Plan:  Patient with lymphoma followed by oncology. She has an upcoming appointment with them. Blood pressure is at goal. She states she has not been taking her cholesterol medication. Otherwise she continues on her thyroid medication and Zestoretic. Will contact patient once I have received her lab work. If her cholesterol is up she will need to resume her Pravachol.I personally performed the services  described in this documentation, which was scribed in my presence. The recorded information has been reviewed and is accurate.  Nena Jordan, MD

## 2015-01-27 ENCOUNTER — Other Ambulatory Visit: Payer: Self-pay | Admitting: Emergency Medicine

## 2015-01-27 DIAGNOSIS — E039 Hypothyroidism, unspecified: Secondary | ICD-10-CM

## 2015-01-27 MED ORDER — LEVOTHYROXINE SODIUM 125 MCG PO TABS: ORAL_TABLET | ORAL | Status: DC

## 2015-02-14 ENCOUNTER — Other Ambulatory Visit: Payer: Self-pay | Admitting: Emergency Medicine

## 2015-03-04 DIAGNOSIS — M810 Age-related osteoporosis without current pathological fracture: Secondary | ICD-10-CM | POA: Diagnosis not present

## 2015-03-04 DIAGNOSIS — Z1231 Encounter for screening mammogram for malignant neoplasm of breast: Secondary | ICD-10-CM | POA: Diagnosis not present

## 2015-03-14 ENCOUNTER — Encounter: Payer: Self-pay | Admitting: Emergency Medicine

## 2015-03-28 ENCOUNTER — Telehealth: Payer: Self-pay

## 2015-03-28 NOTE — Telephone Encounter (Signed)
Dr. Everlene Farrier,  Spoke to pt's husband. Pt was wondering if you saw the results from her DEXA. She also wanted to know if anyone was going to prescribe a medication to help with her bone density loss.  Thank you.

## 2015-03-28 NOTE — Telephone Encounter (Signed)
Pt would like to speak with Dr Perfecto Kingdom asst or him about a Bone Destiny Test she had done. Please call [phone]

## 2015-03-29 NOTE — Telephone Encounter (Signed)
Spoke with pt, advised message from Dr. Daub. Pt understood. 

## 2015-03-29 NOTE — Telephone Encounter (Signed)
Call patient. There is been a 2% loss of bone in the lumbar and a 9% loss of bone in the forearm her risk of 10 year fracture in the hip is 2% and of other bones 10%. With these findings she should continue calcium and vitamin D and weightbearing exercises as tolerated and have repeat study in 2 years.

## 2015-04-11 ENCOUNTER — Telehealth: Payer: Self-pay

## 2015-04-11 NOTE — Telephone Encounter (Signed)
Pt dropped off handicap form to be completed   Best phone for pt is [phone]

## 2015-04-11 NOTE — Telephone Encounter (Signed)
In your box Dr. Daub. 

## 2015-04-14 ENCOUNTER — Telehealth: Payer: Self-pay

## 2015-04-18 DIAGNOSIS — B354 Tinea corporis: Secondary | ICD-10-CM | POA: Diagnosis not present

## 2015-04-19 DIAGNOSIS — B359 Dermatophytosis, unspecified: Secondary | ICD-10-CM | POA: Diagnosis not present

## 2015-04-19 DIAGNOSIS — L570 Actinic keratosis: Secondary | ICD-10-CM | POA: Diagnosis not present

## 2015-04-19 NOTE — Telephone Encounter (Signed)
Called and left VM this morning informing pt placard form is ready to be picked up.

## 2015-04-19 NOTE — Telephone Encounter (Signed)
Pt states please call when her handicap sticker will be ready. Please call [phone]

## 2015-04-25 ENCOUNTER — Telehealth: Payer: Self-pay

## 2015-04-25 MED ORDER — LISINOPRIL-HYDROCHLOROTHIAZIDE 20-12.5 MG PO TABS: 1.0000 | ORAL_TABLET | Freq: Every morning | ORAL | Status: DC

## 2015-04-25 MED ORDER — PRAVASTATIN SODIUM 20 MG PO TABS: 20.0000 mg | ORAL_TABLET | Freq: Every day | ORAL | Status: DC

## 2015-04-25 NOTE — Telephone Encounter (Signed)
Pt has questions about her medication   Best number [phone]

## 2015-04-25 NOTE — Telephone Encounter (Signed)
Spoke with pt, she wants her Lisinopril to be sent in for 90 days. Dr. Everlene Farrier wanted her to start back on her cholesterol medication but did not send in the medication. Rx's sent to [facility].

## 2015-05-31 ENCOUNTER — Encounter: Payer: Self-pay | Admitting: Emergency Medicine

## 2015-06-07 ENCOUNTER — Other Ambulatory Visit: Payer: Self-pay | Admitting: Emergency Medicine

## 2015-06-07 ENCOUNTER — Ambulatory Visit (INDEPENDENT_AMBULATORY_CARE_PROVIDER_SITE_OTHER): Payer: Medicare Other | Admitting: Emergency Medicine

## 2015-06-07 ENCOUNTER — Ambulatory Visit (INDEPENDENT_AMBULATORY_CARE_PROVIDER_SITE_OTHER): Payer: Medicare Other

## 2015-06-07 ENCOUNTER — Encounter: Payer: Self-pay | Admitting: Emergency Medicine

## 2015-06-07 VITALS — BP 137/79 | HR 61 | Temp 98.1°F | Resp 16

## 2015-06-07 DIAGNOSIS — R55 Syncope and collapse: Secondary | ICD-10-CM

## 2015-06-07 DIAGNOSIS — E039 Hypothyroidism, unspecified: Secondary | ICD-10-CM

## 2015-06-07 DIAGNOSIS — M549 Dorsalgia, unspecified: Secondary | ICD-10-CM

## 2015-06-07 DIAGNOSIS — R05 Cough: Secondary | ICD-10-CM | POA: Diagnosis not present

## 2015-06-07 DIAGNOSIS — R059 Cough, unspecified: Secondary | ICD-10-CM

## 2015-06-07 DIAGNOSIS — I10 Essential (primary) hypertension: Secondary | ICD-10-CM

## 2015-06-07 DIAGNOSIS — R5383 Other fatigue: Secondary | ICD-10-CM

## 2015-06-07 DIAGNOSIS — Z23 Encounter for immunization: Secondary | ICD-10-CM

## 2015-06-07 LAB — COMPLETE METABOLIC PANEL WITH GFR
ALT: 11 U/L (ref 6–29)
AST: 17 U/L (ref 10–35)
Albumin: 3.9 g/dL (ref 3.6–5.1)
Alkaline Phosphatase: 95 U/L (ref 33–130)
BUN: 20 mg/dL (ref 7–25)
CALCIUM: 9 mg/dL (ref 8.6–10.4)
CHLORIDE: 99 mmol/L (ref 98–110)
CO2: 30 mmol/L (ref 20–31)
CREATININE: 0.67 mg/dL (ref 0.60–0.93)
GFR, Est Non African American: 88 mL/min (ref >=60)
Glucose, Bld: 87 mg/dL (ref 65–99)
POTASSIUM: 4.7 mmol/L (ref 3.5–5.3)
Sodium: 137 mmol/L (ref 135–146)
Total Bilirubin: 0.6 mg/dL (ref 0.2–1.2)
Total Protein: 6.5 g/dL (ref 6.1–8.1)

## 2015-06-07 LAB — CBC WITH DIFFERENTIAL/PLATELET
Basophils Absolute: 0 10*3/uL (ref 0.0–0.1)
Basophils Relative: 0 % (ref 0–1)
Eosinophils Absolute: 0.1 10*3/uL (ref 0.0–0.7)
Eosinophils Relative: 2 % (ref 0–5)
HCT: 39.4 % (ref 36.0–46.0)
HEMOGLOBIN: 13.3 g/dL (ref 12.0–15.0)
LYMPHS ABS: 2.8 10*3/uL (ref 0.7–4.0)
LYMPHS PCT: 40 % (ref 12–46)
MCH: 29.5 pg (ref 26.0–34.0)
MCHC: 33.8 g/dL (ref 30.0–36.0)
MCV: 87.4 fL (ref 78.0–100.0)
MPV: 10.6 fL (ref 8.6–12.4)
Monocytes Absolute: 0.5 10*3/uL (ref 0.1–1.0)
Monocytes Relative: 7 % (ref 3–12)
NEUTROS ABS: 3.5 10*3/uL (ref 1.7–7.7)
Neutrophils Relative %: 51 % (ref 43–77)
Platelets: 278 10*3/uL (ref 150–400)
RBC: 4.51 MIL/uL (ref 3.87–5.11)
RDW: 14.1 % (ref 11.5–15.5)
WBC: 6.9 10*3/uL (ref 4.0–10.5)

## 2015-06-07 MED ORDER — LOSARTAN POTASSIUM-HCTZ 50-12.5 MG PO TABS: 1.0000 | ORAL_TABLET | Freq: Every day | ORAL | Status: DC

## 2015-06-07 NOTE — Progress Notes (Signed)
This chart was scribed for Kelly Queen, MD by Moises Blood, Medical Scribe. This patient was seen in Room 22 and the patient's care was started 11:55 AM.  Chief Complaint:  Chief Complaint  Patient presents with  . Thyroid Problem    pt states she has difficulty swallowing  . Back Pain    x 5 months  . Dizziness    x 1 month    HPI: Kelly Ray is a 74 y.o. female who reports to [facility] today complaining of multiple concerns.   Cough She notes cough with some drainage from her sinuses for about 3 weeks. She denies belching and heart burn.   Back pain She states having intermittent back pain for about 5 months now. She mentions falling onto her back. She says that when she stands for a while, the pain persists. She denies pain radiating down her legs.   Dizziness She states feeling dizzy for about a month now. She has the feeling of wanting to pass out and feeling fatigue. She can sit in a chair and want to sleep. Her BP is running well. She becomes easily out of breath upon exertion. She denies sleep apnea, and snoring.   Immunizations She received a flu vaccine today.   Past Medical History  Diagnosis Date  . Thyroid disease     Hyperthyroidism  . Arthritis     Osteoarthritis  . Hypertension   . S/P knee replacement     Left knee  . B-cell lymphoma ([place]) 05/04/11    Large B-cell lymphoma  . Cancer ([place])     NHL  . Hx of radiation therapy 08/17/13- 09/03/13    right lower extremity- lymphoma   Past Surgical History  Procedure Laterality Date  . Total knee arthroplasty Left     x 2  . Neck lesion biopsy Right 05/04/11    node biopsies   Social History   Social History  . Marital Status: Married    Spouse Name: N/A  . Number of Children: N/A  . Years of Education: N/A   Social History Main Topics  . Smoking status: Never Smoker   . Smokeless tobacco: None  . Alcohol Use: No  . Drug Use: No  . Sexual Activity: Not Asked   Other Topics Concern  .  None   Social History Narrative   Family History  Problem Relation Age of Onset  . Cancer Mother     bladder  . Heart attack Father   . Cancer Brother     kidney   No Known Allergies Prior to Admission medications   Medication Sig Start Date End Date Taking? Authorizing Provider  levothyroxine (SYNTHROID, LEVOTHROID) 125 MCG tablet Take one tablet every morning 01/27/15   Darlyne Russian, MD  lisinopril-hydrochlorothiazide (PRINZIDE,ZESTORETIC) 20-12.5 MG per tablet Take 1 tablet by mouth every morning. 04/25/15   Darlyne Russian, MD  naproxen sodium (ANAPROX) 220 MG tablet Take 220 mg by mouth as needed.    Historical Provider, MD  pravastatin (PRAVACHOL) 20 MG tablet Take 1 tablet (20 mg total) by mouth at bedtime. 04/25/15   Darlyne Russian, MD     ROS:  Constitutional: negative for chills, fever, night sweats, weight changes; positive for fatigue  HEENT: negative for vision changes, hearing loss, congestion, rhinorrhea, ST, epistaxis, or sinus pressure Cardiovascular: negative for chest pain or palpitations Respiratory: negative for hemoptysis, wheezing; positive for cough, shortness of breath upon exertion Abdominal: negative for abdominal pain,  nausea, vomiting, diarrhea, or constipation Dermatological: negative for rash Neurologic: negative for headache, or syncope; positive for dizziness Musc: positive for back pain All other systems reviewed and are otherwise negative with the exception to those above and in the HPI.  PHYSICAL EXAM: Filed Vitals:   06/07/15 1122  BP: 137/79  Pulse: 61  Temp: 98.1 F (36.7 C)  Resp: 16   There is no weight on file to calculate BMI.   General: Alert, no acute distress HEENT:  Normocephalic, atraumatic, oropharynx patent. Eye: Juliette Mangle [facility] Cardiovascular:  Regular rate and rhythm, no rubs murmurs or gallops.  No Carotid bruits, radial pulse intact. No pedal edema.  Respiratory: Clear to auscultation bilaterally.  No wheezes, rales, or  rhonchi.  No cyanosis, no use of accessory musculature Abdominal: No organomegaly, abdomen is soft and non-tender, positive bowel sounds. No masses. Musculoskeletal: Gait intact. No edema, tenderness Skin: No rashes. Neurologic: Facial musculature symmetric; did not have patellar and achilles reflexes bilaterally Psychiatric: Patient acts appropriately throughout our interaction.  Lymphatic: No cervical or submandibular lymphadenopathy Genitourinary/Anorectal: No acute findings   LABS: Results for orders placed or performed in visit on 01/26/15  TSH  Result Value Ref Range   TSH 0.110 (L) 0.350 - 4.500 uIU/mL  Comprehensive metabolic panel  Result Value Ref Range   Sodium 141 135 - 145 mEq/L   Potassium 5.3 3.5 - 5.3 mEq/L   Chloride 101 96 - 112 mEq/L   CO2 31 19 - 32 mEq/L   Glucose, Bld 100 (H) 70 - 99 mg/dL   BUN 19 6 - 23 mg/dL   Creat 0.71 0.50 - 1.10 mg/dL   Total Bilirubin 0.6 0.2 - 1.2 mg/dL   Alkaline Phosphatase 103 39 - 117 U/L   AST 15 0 - 37 U/L   ALT 11 0 - 35 U/L   Total Protein 6.7 6.0 - 8.3 g/dL   Albumin 3.9 3.5 - 5.2 g/dL   Calcium 9.3 8.4 - 10.5 mg/dL  Lipid panel  Result Value Ref Range   Cholesterol 224 (H) 0 - 200 mg/dL   Triglycerides 102 <150 mg/dL   HDL 75 >=46 mg/dL   Total CHOL/HDL Ratio 3.0 Ratio   VLDL 20 0 - 40 mg/dL   LDL Cholesterol 129 (H) 0 - 99 mg/dL  POCT CBC  Result Value Ref Range   WBC 6.8 4.6 - 10.2 K/uL   Lymph, poc 2.5 0.6 - 3.4   POC LYMPH PERCENT 37.1 10 - 50 %L   MID (cbc) 0.4 0 - 0.9   POC MID % 5.4 0 - 12 %M   POC Granulocyte 3.9 2 - 6.9   Granulocyte percent 57.5 37 - 80 %G   RBC 4.92 4.04 - 5.48 M/uL   Hemoglobin 13.8 12.2 - 16.2 g/dL   HCT, POC 44.0 37.7 - 47.9 %   MCV 89.5 80 - 97 fL   MCH, POC 28.1 27 - 31.2 pg   MCHC 31.4 (A) 31.8 - 35.4 g/dL   RDW, POC 15.2 %   Platelet Count, POC 286 142 - 424 K/uL   MPV 8.2 0 - 99.8 fL     EKG/XRAY:   Primary read interpreted by Dr. Everlene Farrier at [facility] Normal sinus  rhythm Chest x-ray is no acute disease. Lumbar spine shows evidence of a scoliotic curve with previous L1 compression fracture which appears old to me please comment. There is L4 L5 degenerative disc disease  ASSESSMENT/PLAN: We'll await the reading from the radiologist  regarding her lumbar spine films. I changed her blood pressure medicine to Hyzaar to see if that helps with cough. I scheduled her for 2 appointments one to do a CT of her head and second get cardiology evaluation to make sure some of her feelings of loss of vision are not secondary to an arrhythmia.I personally performed the services described in this documentation, which was scribed in my presence. The recorded information has been reviewed and is accurate.  By signing my name below, I, Moises Blood, attest that this documentation has been prepared under the direction and in the presence of Kelly Queen, MD. Electronically Signed: Moises Blood, [place]. 06/07/2015 , 11:55 AM .    Johney [place] sideeffects, risk and benefits, and alternatives of medications d/w patient. Patient is aware that all medications have potential sideeffects and we are unable to predict every sideeffect or drug-drug interaction that may occur.  Kelly Queen MD 06/07/2015 11:55 AM

## 2015-06-08 ENCOUNTER — Telehealth: Payer: Self-pay | Admitting: Family Medicine

## 2015-06-08 LAB — SEDIMENTATION RATE: SED RATE: 6 mm/h (ref 0–30)

## 2015-06-08 LAB — TSH: TSH: 0.299 u[IU]/mL — AB (ref 0.350–4.500)

## 2015-06-08 NOTE — Telephone Encounter (Signed)
Spoke with patient and she stated that she had her colonoscopy in [place] in 2007.  She will bring a copy by for [place] to scan into her chart.

## 2015-06-10 ENCOUNTER — Telehealth: Payer: Self-pay

## 2015-06-10 LAB — T4, FREE: Free T4: 1.25 ng/dL (ref 0.80–1.80)

## 2015-06-10 NOTE — Telephone Encounter (Signed)
Results were sent to mychart

## 2015-06-10 NOTE — Telephone Encounter (Signed)
PATIENT STATES SHE SAW DR. DAUB ON Tuesday FOR HER THYROID. HE DID SOME LAB WORK ON HER. SHE WOULD LIKE TO GET THE RESULTS PLEASE. BEST PHONE [phone] (CELL)  PHARMACY CHOICE IS [facility] ON [address].  [place]

## 2015-06-10 NOTE — Telephone Encounter (Signed)
Notes Recorded by Constance Goltz, CMA on 06/10/2015 at 8:37 AM Pt notified thru [place]. Notes Recorded by Darlyne Russian, MD on 06/08/2015 at 8:06 AM Add a free T4 to bloodwork at the lab. Call labs otherwise look normal. Her thyroid dosage may be slightly high. I am awaiting one other test.

## 2015-06-11 NOTE — Telephone Encounter (Signed)
Spoke with patient. She is not actively checking messages in My Chart.  Reviewed lab results with her. She will proceed with head CT and cardiology visit this coming week.

## 2015-06-13 ENCOUNTER — Ambulatory Visit
Admission: RE | Admit: 2015-06-13 | Discharge: 2015-06-13 | Disposition: A | Discharge status: Home / Self Care | Payer: Medicare Other | Source: Ambulatory Visit | Attending: Emergency Medicine | Admitting: Emergency Medicine

## 2015-06-13 DIAGNOSIS — R55 Syncope and collapse: Secondary | ICD-10-CM | POA: Diagnosis not present

## 2015-06-13 DIAGNOSIS — H538 Other visual disturbances: Secondary | ICD-10-CM | POA: Diagnosis not present

## 2015-06-22 DIAGNOSIS — R5383 Other fatigue: Secondary | ICD-10-CM | POA: Diagnosis not present

## 2015-06-22 DIAGNOSIS — R5381 Other malaise: Secondary | ICD-10-CM | POA: Diagnosis not present

## 2015-06-22 DIAGNOSIS — R0602 Shortness of breath: Secondary | ICD-10-CM | POA: Diagnosis not present

## 2015-06-22 DIAGNOSIS — R55 Syncope and collapse: Secondary | ICD-10-CM | POA: Diagnosis not present

## 2015-06-28 DIAGNOSIS — R0602 Shortness of breath: Secondary | ICD-10-CM | POA: Diagnosis not present

## 2015-06-28 DIAGNOSIS — R55 Syncope and collapse: Secondary | ICD-10-CM | POA: Diagnosis not present

## 2015-07-04 DIAGNOSIS — I471 Supraventricular tachycardia: Secondary | ICD-10-CM | POA: Diagnosis not present

## 2015-07-04 DIAGNOSIS — R5383 Other fatigue: Secondary | ICD-10-CM | POA: Diagnosis not present

## 2015-07-04 DIAGNOSIS — R55 Syncope and collapse: Secondary | ICD-10-CM | POA: Diagnosis not present

## 2015-07-04 DIAGNOSIS — R5381 Other malaise: Secondary | ICD-10-CM | POA: Diagnosis not present

## 2015-07-05 DIAGNOSIS — R55 Syncope and collapse: Secondary | ICD-10-CM | POA: Diagnosis not present

## 2015-08-16 ENCOUNTER — Other Ambulatory Visit: Payer: Self-pay | Admitting: Dermatology

## 2015-08-16 DIAGNOSIS — D044 Carcinoma in situ of skin of scalp and neck: Secondary | ICD-10-CM | POA: Diagnosis not present

## 2015-12-07 ENCOUNTER — Other Ambulatory Visit: Payer: Self-pay | Admitting: Dermatology

## 2015-12-13 ENCOUNTER — Telehealth: Payer: Self-pay | Admitting: *Deleted

## 2015-12-13 ENCOUNTER — Other Ambulatory Visit: Payer: Self-pay | Admitting: *Deleted

## 2015-12-13 DIAGNOSIS — C8515 Unspecified B-cell lymphoma, lymph nodes of inguinal region and lower limb: Secondary | ICD-10-CM

## 2015-12-13 NOTE — Addendum Note (Signed)
Addended by: Brien Few on: 12/13/2015 04:46 PM   Modules accepted: Orders

## 2015-12-13 NOTE — Telephone Encounter (Signed)
I saw Dr. Denna Haggard last Tuesday.  He called me today with biopsy results.  The Lymphoma has returned and I need an appointment ASAP with Dr. Benay Spice.  I have one spot on my left leg and two spots on the right.  Dr. Denna Haggard will send the information to Dr. Benay Spice.

## 2015-12-14 ENCOUNTER — Telehealth: Payer: Self-pay | Admitting: Oncology

## 2015-12-14 NOTE — Telephone Encounter (Signed)
Spoke with pt, she has appointment info for 4/20.

## 2015-12-14 NOTE — Telephone Encounter (Signed)
lvm for pt regarding to 4.20 appt

## 2015-12-15 ENCOUNTER — Other Ambulatory Visit (HOSPITAL_BASED_OUTPATIENT_CLINIC_OR_DEPARTMENT_OTHER): Payer: Medicare Other

## 2015-12-15 ENCOUNTER — Telehealth: Payer: Self-pay | Admitting: Oncology

## 2015-12-15 ENCOUNTER — Ambulatory Visit (HOSPITAL_BASED_OUTPATIENT_CLINIC_OR_DEPARTMENT_OTHER): Payer: Medicare Other | Admitting: Oncology

## 2015-12-15 VITALS — BP 145/73 | HR 60 | Temp 98.0°F | Resp 18 | Ht 62.5 in | Wt 194.6 lb

## 2015-12-15 DIAGNOSIS — C8335 Diffuse large B-cell lymphoma, lymph nodes of inguinal region and lower limb: Secondary | ICD-10-CM

## 2015-12-15 DIAGNOSIS — C8515 Unspecified B-cell lymphoma, lymph nodes of inguinal region and lower limb: Secondary | ICD-10-CM

## 2015-12-15 DIAGNOSIS — M1990 Unspecified osteoarthritis, unspecified site: Secondary | ICD-10-CM | POA: Diagnosis not present

## 2015-12-15 DIAGNOSIS — I10 Essential (primary) hypertension: Secondary | ICD-10-CM | POA: Diagnosis not present

## 2015-12-15 DIAGNOSIS — E039 Hypothyroidism, unspecified: Secondary | ICD-10-CM | POA: Diagnosis not present

## 2015-12-15 DIAGNOSIS — R5383 Other fatigue: Secondary | ICD-10-CM

## 2015-12-15 LAB — CBC WITH DIFFERENTIAL/PLATELET
BASO%: 0.6 % (ref 0.0–2.0)
BASOS ABS: 0 10*3/uL (ref 0.0–0.1)
EOS ABS: 0.1 10*3/uL (ref 0.0–0.5)
EOS%: 1.6 % (ref 0.0–7.0)
HEMATOCRIT: 41.7 % (ref 34.8–46.6)
HEMOGLOBIN: 13.7 g/dL (ref 11.6–15.9)
LYMPH#: 2.7 10*3/uL (ref 0.9–3.3)
LYMPH%: 37.4 % (ref 14.0–49.7)
MCH: 28.8 pg (ref 25.1–34.0)
MCHC: 32.9 g/dL (ref 31.5–36.0)
MCV: 87.8 fL (ref 79.5–101.0)
MONO#: 0.4 10*3/uL (ref 0.1–0.9)
MONO%: 5.6 % (ref 0.0–14.0)
NEUT#: 3.9 10*3/uL (ref 1.5–6.5)
NEUT%: 54.8 % (ref 38.4–76.8)
PLATELETS: 247 10*3/uL (ref 145–400)
RBC: 4.76 10*6/uL (ref 3.70–5.45)
RDW: 13.8 % (ref 11.2–14.5)
WBC: 7.2 10*3/uL (ref 3.9–10.3)

## 2015-12-15 LAB — COMPREHENSIVE METABOLIC PANEL
ALK PHOS: 100 U/L (ref 40–150)
ALT: 10 U/L (ref 0–55)
ANION GAP: 8 meq/L (ref 3–11)
AST: 21 U/L (ref 5–34)
Albumin: 3.8 g/dL (ref 3.5–5.0)
BUN: 21.4 mg/dL (ref 7.0–26.0)
CALCIUM: 9.7 mg/dL (ref 8.4–10.4)
CHLORIDE: 102 meq/L (ref 98–109)
CO2: 30 mEq/L — ABNORMAL HIGH (ref 22–29)
Creatinine: 0.9 mg/dL (ref 0.6–1.1)
EGFR: 64 mL/min/{1.73_m2} — AB (ref >90)
Glucose: 105 mg/dl (ref 70–140)
POTASSIUM: 4.7 meq/L (ref 3.5–5.1)
Sodium: 140 mEq/L (ref 136–145)
Total Bilirubin: 0.52 mg/dL (ref 0.20–1.20)
Total Protein: 7.2 g/dL (ref 6.4–8.3)

## 2015-12-15 LAB — LACTATE DEHYDROGENASE: LDH: 224 U/L (ref 125–245)

## 2015-12-15 LAB — URIC ACID: URIC ACID, SERUM: 6.4 mg/dL (ref 2.6–7.4)

## 2015-12-15 NOTE — Telephone Encounter (Signed)
Gave and printed appt sched adn avs fo rpt for April..the patient ok and aware of 12:30

## 2015-12-15 NOTE — Progress Notes (Signed)
  [facility] OFFICE PROGRESS NOTE   Diagnosis: Non-Hodgkin's lymphoma  INTERVAL HISTORY:   Ms. Rossie was last seen at the [facility] in March 2015. She reports nodules at the right lower leg for the past month. The lesions that were treated in 2014 have not recurred. She was seen in a pain clinic and reports being diagnosed with superficial "thrombosis ". No treatment was recommended. She saw Dr. Nunzio Cory last week. 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 Biopsies at the right medial lower leg and right upper lower leg revealed large B-cell lymphoma. The lesions are positive for CD20, CD79a, and BCL-2.  She reports mild malaise. No other complaint. No palpable lymph nodes. Good appetite. No fever or night sweats.  Objective:  Vital signs in last 24 hours:  Blood pressure 145/73, pulse 60, temperature 98 F (36.7 C), temperature source Oral, resp. rate 18, height 5' 2.5" (1.588 m), weight 194 lb 9.6 oz (88.27 kg), SpO2 99 %.    HEENT: Oropharynx without visible mass, neck without mass Lymphatics: No cervical, supraclavicular, axillary, or inguinal nodes Resp: Bilateral inspiratory/expiratory wheezes, no respiratory distress Cardio: Regular rate and rhythm GI: No hepatomegaly, no mass, nontender Vascular: No leg edema  Skin: Multiple nodular pain/light purple lesions at the right lower leg including lesions at the right upper pretibial area and posterior calf. On the biopsy site at the left pretibial area with an eschar     Lab Results:  Lab Results  Component Value Date   WBC 7.2 12/15/2015   HGB 13.7 12/15/2015   HCT 41.7 12/15/2015   MCV 87.8 12/15/2015   PLT 247 12/15/2015   NEUTROABS 3.9 12/15/2015  LDH 224   Medications: I have reviewed the patient's current medications.  Assessment/Plan: 1. 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 Negative restaging CT evaluation 10/24/2012  Nodular skin lesions at the right lower leg, status post a shave biopsy 07/15/2013 confirming a malignant B-cell lymphoma, diffuse large cell positive for CD20, BCL 6, BCL 2, and CD10   Staging bone marrow biopsy 07/31/2013, negative for involvement with lymphoma   Staging PET scan 07/30/2013-negative.   Status post palliative radiation right lower leg nodular skin lesions 08/17/2013 through 09/03/2013.  New nodular skin lesions at the right lower leg April 2017, status post biopsies of lesions at the right lower leg 12/07/2015 confirming large B-cell lymphoma, CD20 positive 2. Port-A-Cath placement 06/01/2011. Port-A-Cath removal 11/04/2012 3. Hypertension. 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 5. Hypercholesterolemia. 6. Osteoarthritis. 7. Malaise-? Secondary to progression of non-Hodgkin's lymphoma. Improved. 8. Staph infection right lower leg February 2015. 9.    Disposition:  Ms. Bucholz has progression of the large B-cell lymphoma. There are multiple skin nodules at the right lower leg. She will be referred for a staging PET scan and return to discuss treatment options next week. She could respond to additional pelvic radiation, but I favor systemic therapy. She indicated she is not interested in stem cell therapy.  We will discuss salvage systemic treatment options including EPOCH-R, R-ICE, and bendamustine-rituximab.  Betsy Coder, MD  12/15/2015  2:54 PM

## 2015-12-16 ENCOUNTER — Telehealth: Payer: Self-pay | Admitting: *Deleted

## 2015-12-16 NOTE — Telephone Encounter (Signed)
Call from pt reporting PET is scheduled for 5/1. Office visit is scheduled for 4/28. Reviewed with Dr. Benay Spice: OK to move office visit out. Called pt with new appt for 5/2 at 1100.

## 2015-12-23 ENCOUNTER — Ambulatory Visit: Payer: Medicare Other | Admitting: Oncology

## 2015-12-26 ENCOUNTER — Ambulatory Visit (HOSPITAL_COMMUNITY)
Admission: RE | Admit: 2015-12-26 | Discharge: 2015-12-26 | Disposition: A | Discharge status: Home / Self Care | Payer: Medicare Other | Source: Ambulatory Visit | Attending: Oncology | Admitting: Oncology

## 2015-12-26 DIAGNOSIS — C8335 Diffuse large B-cell lymphoma, lymph nodes of inguinal region and lower limb: Secondary | ICD-10-CM

## 2015-12-26 DIAGNOSIS — I2510 Atherosclerotic heart disease of native coronary artery without angina pectoris: Secondary | ICD-10-CM | POA: Diagnosis not present

## 2015-12-26 LAB — GLUCOSE, CAPILLARY: GLUCOSE-CAPILLARY: 78 mg/dL (ref 65–99)

## 2015-12-26 MED ORDER — FLUDEOXYGLUCOSE F - 18 (FDG) INJECTION
9.7200 | Freq: Once | INTRAVENOUS | Status: AC | PRN
  Administered 2015-12-26: 9.72 via INTRAVENOUS

## 2015-12-27 ENCOUNTER — Ambulatory Visit (HOSPITAL_BASED_OUTPATIENT_CLINIC_OR_DEPARTMENT_OTHER): Payer: Medicare Other | Admitting: Oncology

## 2015-12-27 ENCOUNTER — Telehealth: Payer: Self-pay | Admitting: *Deleted

## 2015-12-27 ENCOUNTER — Telehealth: Payer: Self-pay | Admitting: Oncology

## 2015-12-27 VITALS — BP 142/73 | HR 59 | Temp 98.4°F | Resp 18 | Ht 62.5 in | Wt 194.4 lb

## 2015-12-27 DIAGNOSIS — C8335 Diffuse large B-cell lymphoma, lymph nodes of inguinal region and lower limb: Secondary | ICD-10-CM | POA: Diagnosis not present

## 2015-12-27 DIAGNOSIS — M1990 Unspecified osteoarthritis, unspecified site: Secondary | ICD-10-CM

## 2015-12-27 DIAGNOSIS — I10 Essential (primary) hypertension: Secondary | ICD-10-CM

## 2015-12-27 DIAGNOSIS — E039 Hypothyroidism, unspecified: Secondary | ICD-10-CM | POA: Diagnosis not present

## 2015-12-27 DIAGNOSIS — R5381 Other malaise: Secondary | ICD-10-CM | POA: Diagnosis not present

## 2015-12-27 DIAGNOSIS — E7800 Pure hypercholesterolemia, unspecified: Secondary | ICD-10-CM

## 2015-12-27 NOTE — Telephone Encounter (Signed)
left msg for may 10 tx time change

## 2015-12-27 NOTE — Progress Notes (Signed)
[place] OFFICE PROGRESS NOTE   Diagnosis:  Non-Hodgkin's lymphoma  INTERVAL HISTORY:   Ms.  Ray returns as scheduled. She feels well. The biopsy site at the right calf has started draining. Other lesions have enlarged and there are new small lesions at the upper calf.  Objective:  Vital signs in last 24 hours:  Blood pressure 142/73, pulse 59, temperature 98.4 F (36.9 C), temperature source Oral, resp. rate 18, height 5' 2.5" (1.588 m), weight 194 lb 6.4 oz (88.179 kg), SpO2 97 %.   Resp:  Lungs clear bilaterally Cardio:  Regular rate and rhythm GI:  No  hepatosplenomegaly Vascular:  No leg edema  Skin: multiple nodular pink/light purple lesions at the right lower leg including the largest lesion at the right pretibial region. The  Biopsied lesion at the right calf is scabbed with faint surrounding erythema, several smaller nodular lesions at the right posterior calf-a few of these are subcutaneous, eschar at the left pretibial area    Lab Results:  Lab Results  Component Value Date   WBC 7.2 12/15/2015   HGB 13.7 12/15/2015   HCT 41.7 12/15/2015   MCV 87.8 12/15/2015   PLT 247 12/15/2015   NEUTROABS 3.9 12/15/2015    Lab Results  Component Value Date   NA 140 12/15/2015    No results found for: CEA1  Imaging:  Nm Pet Image Restage (ps) Whole Body  12/26/2015  CLINICAL DATA:  Subsequent treatment strategy for diffuse large B-cell lymphoma, originally diagnosed in the neck in October 2012, with recurrence in the right lower extremity on 12/07/2015 skin nodule biopsies. EXAM: NUCLEAR MEDICINE PET WHOLE BODY TECHNIQUE: 9.7 mCi F-18 FDG was injected intravenously. Full-ring PET imaging was performed from the vertex to the feet after the radiotracer. CT data was obtained and used for attenuation correction and anatomic localization. FASTING BLOOD GLUCOSE:  Value:  78 mg/dl COMPARISON:  07/30/2013 PET-CT. FINDINGS: Head/Neck: No hypermetabolic lymph nodes  in the neck. Symmetric hypermetabolism in the lingual tonsils is nonspecific and probably physiologic. Chest: No hypermetabolic axillary, mediastinal or hilar nodes. Coronary atherosclerosis. No pleural effusions. Relatively symmetric subpleural reticulonodular opacities at both lung apices are not appreciably changed back to 02/08/2012 chest CT and are not associated with hypermetabolism, most consistent with biapical pleural-parenchymal scarring. Subpleural 4 mm pulmonary nodule in the lingula is not appreciably changed since 10/16/2012 and benign. No acute consolidative airspace disease or new significant pulmonary nodules. Stable mosaic attenuation throughout the lungs, most commonly due to air trapping. Abdomen/Pelvis: No abnormal hypermetabolic activity within the liver, pancreas, adrenal glands, or spleen. No hypermetabolic lymph nodes in the abdomen or pelvis. Skeleton: No focal hypermetabolic activity to suggest skeletal metastasis. Extremities: There are 4 cutaneous and 1 subcutaneous hypermetabolic nodules in the distal right lower extremity at the level of the mid to proximal right calf. For example there is a hypermetabolic 3.1 x 1.4 cm anterior right lower extremity cutaneous nodule with max SUV 12.4 (series 4/image 313). There is a subcutaneous 1.4 x 0.7 cm posterior right calf hypermetabolic nodule with max SUV 14.3 (series 4/image 316). There is a medial right calf cutaneous 1.7 x 1.3 cm hypermetabolic nodule with max SUV 13.0 (series 4/image 337). No hypermetabolic activity in the upper extremities or left lower extremity to suggest metastasis. Focal hypermetabolism in the dorsal third and fourth tarsal metatarsal joints in the right foot appears to correlate with degenerative change on the CT images. Hypermetabolism surrounding the left total knee arthroplasty hardware is  probably due to attenuation correction artifact. IMPRESSION: 1. Five superficial (cutaneous and subcutaneous) hypermetabolic  nodules in the distal right lower extremity at the level of the mid to proximal right calf, consistent with biopsy-proven lymphoma. 2. No hypermetabolic lymphoma in the head, neck, chest, abdomen, pelvis, upper extremities or left lower extremity. 3. Coronary atherosclerosis. Electronically Signed   By: Ilona Sorrel M.D.   On: 12/26/2015 13:56   images reviewed  Medications: I have reviewed the patient's current medications.  Assessment/Plan: 1. 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 Negative restaging CT evaluation 10/24/2012  Nodular skin lesions at the right lower leg, status post a shave biopsy 07/15/2013 confirming a malignant B-cell lymphoma, diffuse large cell positive for CD20, BCL 6, BCL 2, and CD10   Staging bone marrow biopsy 07/31/2013, negative for involvement with lymphoma   Staging PET scan 07/30/2013-negative.   Status post palliative radiation right lower leg nodular skin lesions 08/17/2013 through 09/03/2013.  New nodular skin lesions at the right lower leg April 2017, status post biopsies of lesions at the right lower leg 12/07/2015 confirming large B-cell lymphoma, CD20 positive   staging PET scan 12/26/2015-hypermetabolic cutaneous and subcutaneous nodules in the right lower extremity, no other evidence of lymphoma 2. Port-A-Cath placement 06/01/2011. Port-A-Cath removal 11/04/2012 3. Hypertension. 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 5. Hypercholesterolemia. 6. Osteoarthritis. 7. Malaise-? Secondary to progression of non-Hodgkin's lymphoma. Improved. 8. Staph infection right lower leg February 2015.   Disposition:   Kelly Ray appears stable. I discussed the PET findings with Kelly Ray and her husband. There is no evidence of active lymphoma other than the right lower extremity cutaneous lesions.  I recommend systemic therapy. Kelly Ray reiterated that she  does not wish to consider transplant therapy or aggressive salvage therapies. I recommend a trial of bendamustine/rituximab. We reviewed the potential toxicities associated with this regimen including the chance for nausea, rash, hematologic toxicity, an allergic reaction, leukoencephalitis, and reactivation of hepatitis. She agrees to proceed.  A first cycle of bendamustine/rituximab will be scheduled for 01/03/2016. She will return for an office visit and nadir CBC on 01/17/2016.  She will hold blood pressure medications on the day of rituximab therapy. Betsy Coder, MD  12/27/2015  11:17 AM

## 2015-12-27 NOTE — Telephone Encounter (Signed)
Per staff message and POF I have scheduled appts. Advised scheduler of appts. JMW  

## 2015-12-27 NOTE — Telephone Encounter (Signed)
Gave pt appt & avs °

## 2016-01-01 ENCOUNTER — Other Ambulatory Visit: Payer: Self-pay | Admitting: Oncology

## 2016-01-03 ENCOUNTER — Ambulatory Visit: Payer: Medicare Other

## 2016-01-03 ENCOUNTER — Ambulatory Visit (HOSPITAL_BASED_OUTPATIENT_CLINIC_OR_DEPARTMENT_OTHER): Payer: Medicare Other

## 2016-01-03 ENCOUNTER — Other Ambulatory Visit: Payer: Self-pay | Admitting: *Deleted

## 2016-01-03 ENCOUNTER — Other Ambulatory Visit: Payer: Medicare Other

## 2016-01-03 VITALS — BP 113/66 | HR 60 | Temp 98.3°F | Resp 16

## 2016-01-03 DIAGNOSIS — Z5112 Encounter for antineoplastic immunotherapy: Secondary | ICD-10-CM

## 2016-01-03 DIAGNOSIS — C8335 Diffuse large B-cell lymphoma, lymph nodes of inguinal region and lower limb: Secondary | ICD-10-CM

## 2016-01-03 DIAGNOSIS — Z5111 Encounter for antineoplastic chemotherapy: Secondary | ICD-10-CM

## 2016-01-03 MED ORDER — PALONOSETRON HCL INJECTION 0.25 MG/5ML
0.2500 mg | Freq: Once | INTRAVENOUS | Status: AC
  Administered 2016-01-03: 0.25 mg via INTRAVENOUS

## 2016-01-03 MED ORDER — SODIUM CHLORIDE 0.9 % IV SOLN
90.0000 mg/m2 | Freq: Once | INTRAVENOUS | Status: AC
  Administered 2016-01-03: 175 mg via INTRAVENOUS
  Filled 2016-01-03: qty 7

## 2016-01-03 MED ORDER — ACETAMINOPHEN 325 MG PO TABS
650.0000 mg | ORAL_TABLET | Freq: Once | ORAL | Status: AC
  Administered 2016-01-03: 650 mg via ORAL

## 2016-01-03 MED ORDER — ACETAMINOPHEN 325 MG PO TABS
ORAL_TABLET | ORAL | Status: AC
  Filled 2016-01-03: qty 2

## 2016-01-03 MED ORDER — SODIUM CHLORIDE 0.9 % IV SOLN
375.0000 mg/m2 | Freq: Once | INTRAVENOUS | Status: AC
  Administered 2016-01-03: 700 mg via INTRAVENOUS
  Filled 2016-01-03: qty 60

## 2016-01-03 MED ORDER — DIPHENHYDRAMINE HCL 25 MG PO CAPS
ORAL_CAPSULE | ORAL | Status: AC
  Filled 2016-01-03: qty 2

## 2016-01-03 MED ORDER — PROCHLORPERAZINE MALEATE 10 MG PO TABS: 10.0000 mg | ORAL_TABLET | Freq: Four times a day (QID) | ORAL | Status: DC | PRN

## 2016-01-03 MED ORDER — DIPHENHYDRAMINE HCL 25 MG PO CAPS
50.0000 mg | ORAL_CAPSULE | Freq: Once | ORAL | Status: AC
  Administered 2016-01-03: 50 mg via ORAL

## 2016-01-03 MED ORDER — SODIUM CHLORIDE 0.9 % IV SOLN
Freq: Once | INTRAVENOUS | Status: AC
  Administered 2016-01-03: 10:00:00 via INTRAVENOUS

## 2016-01-03 MED ORDER — SODIUM CHLORIDE 0.9 % IV SOLN
10.0000 mg | Freq: Once | INTRAVENOUS | Status: AC
  Administered 2016-01-03: 10 mg via INTRAVENOUS
  Filled 2016-01-03: qty 1

## 2016-01-03 MED ORDER — PALONOSETRON HCL INJECTION 0.25 MG/5ML
INTRAVENOUS | Status: AC
  Filled 2016-01-03: qty 5

## 2016-01-03 NOTE — Patient Instructions (Signed)
[facility] Discharge Instructions for Patients Receiving Chemotherapy  Today you received the following chemotherapy agents rituxan and bendamustine  To help prevent nausea and vomiting after your treatment, we encourage you to take your nausea medication.   If you develop nausea and vomiting that is not controlled by your nausea medication, call the clinic.   BELOW ARE SYMPTOMS THAT SHOULD BE REPORTED IMMEDIATELY:  *FEVER GREATER THAN 100.5 F  *CHILLS WITH OR WITHOUT FEVER  NAUSEA AND VOMITING THAT IS NOT CONTROLLED WITH YOUR NAUSEA MEDICATION  *UNUSUAL SHORTNESS OF BREATH  *UNUSUAL BRUISING OR BLEEDING  TENDERNESS IN MOUTH AND THROAT WITH OR WITHOUT PRESENCE OF ULCERS  *URINARY PROBLEMS  *BOWEL PROBLEMS  UNUSUAL RASH Items with * indicate a potential emergency and should be followed up as soon as possible.  Feel free to call the clinic you have any questions or concerns. The clinic phone number is [phone].  Please show the [facility] at check-in to the Emergency Department and triage nurse.  Rituximab injection What is this medicine? RITUXIMAB (ri TUX i mab) is a monoclonal antibody. It is used commonly to treat non-Hodgkin lymphoma and other conditions. It is also used to treat rheumatoid arthritis (RA). In RA, this medicine slows the inflammatory process and help reduce joint pain and swelling. This medicine is often used with other cancer or arthritis medications. This medicine may be used for other purposes; ask your health care provider or pharmacist if you have questions. What should I tell my health care provider before I take this medicine? They need to know if you have any of these conditions: -blood disorders -heart disease -history of hepatitis B -infection (especially a virus infection such as chickenpox, cold sores, or herpes) -irregular heartbeat -kidney disease -lung or breathing disease, like asthma -lupus -an unusual or  allergic reaction to rituximab, mouse proteins, other medicines, foods, dyes, or preservatives -pregnant or trying to get pregnant -breast-feeding How should I use this medicine? This medicine is for infusion into a vein. It is administered in a hospital or clinic by a specially trained health care professional. A special MedGuide will be given to you by the pharmacist with each prescription and refill. Be sure to read this information carefully each time. Talk to your pediatrician regarding the use of this medicine in children. This medicine is not approved for use in children. Overdosage: If you think you have taken too much of this medicine contact a poison control center or emergency room at once. NOTE: This medicine is only for you. Do not share this medicine with others. What if I miss a dose? It is important not to miss a dose. Call your doctor or health care professional if you are unable to keep an appointment. What may interact with this medicine? -cisplatin -medicines for blood pressure -some other medicines for arthritis -vaccines This list may not describe all possible interactions. Give your health care provider a list of all the medicines, herbs, non-prescription drugs, or dietary supplements you use. Also tell them if you smoke, drink alcohol, or use illegal drugs. Some items may interact with your medicine. What should I watch for while using this medicine? Report any side effects that you notice during your treatment right away, such as changes in your breathing, fever, chills, dizziness or lightheadedness. These effects are more common with the first dose. Visit your prescriber or health care professional for checks on your progress. You will need to have regular blood work. Report any  other side effects. The side effects of this medicine can continue after you finish your treatment. Continue your course of treatment even though you feel ill unless your doctor tells you to  stop. Call your doctor or health care professional for advice if you get a fever, chills or sore throat, or other symptoms of a cold or flu. Do not treat yourself. This drug decreases your body's ability to fight infections. Try to avoid being around people who are sick. This medicine may increase your risk to bruise or bleed. Call your doctor or health care professional if you notice any unusual bleeding. Be careful brushing and flossing your teeth or using a toothpick because you may get an infection or bleed more easily. If you have any dental work done, tell your dentist you are receiving this medicine. Avoid taking products that contain aspirin, acetaminophen, ibuprofen, naproxen, or ketoprofen unless instructed by your doctor. These medicines may hide a fever. Do not become pregnant while taking this medicine. Women should inform their doctor if they wish to become pregnant or think they might be pregnant. There is a potential for serious side effects to an unborn child. Talk to your health care professional or pharmacist for more information. Do not breast-feed an infant while taking this medicine. What side effects may I notice from receiving this medicine? Side effects that you should report to your doctor or health care professional as soon as possible: -allergic reactions like skin rash, itching or hives, swelling of the face, lips, or tongue -low blood counts - this medicine may decrease the number of white blood cells, red blood cells and platelets. You may be at increased risk for infections and bleeding. -signs of infection - fever or chills, cough, sore throat, pain or difficulty passing urine -signs of decreased platelets or bleeding - bruising, pinpoint red spots on the skin, black, tarry stools, blood in the urine -signs of decreased red blood cells - unusually weak or tired, fainting spells, lightheadedness -breathing problems -confused, not responsive -chest pain -fast, irregular  heartbeat -feeling faint or lightheaded, falls -mouth sores -redness, blistering, peeling or loosening of the skin, including inside the mouth -stomach pain -swelling of the ankles, feet, or hands -trouble passing urine or change in the amount of urine Side effects that usually do not require medical attention (report to your doctor or other health care professional if they continue or are bothersome): -anxiety -headache -loss of appetite -muscle aches -nausea -night sweats This list may not describe all possible side effects. Call your doctor for medical advice about side effects. You may report side effects to FDA at 1-800-FDA-1088. Where should I keep my medicine? This drug is given in a hospital or clinic and will not be stored at home. NOTE: This sheet is a summary. It may not cover all possible information. If you have questions about this medicine, talk to your doctor, pharmacist, or health care provider.    2016, Elsevier/Gold Standard. (2014-10-20 22:30:56)  Bendamustine Injection What is this medicine? BENDAMUSTINE (BEN da MUS teen) is a chemotherapy drug. It is used to treat chronic lymphocytic leukemia and non-Hodgkin lymphoma. This medicine may be used for other purposes; ask your health care provider or pharmacist if you have questions. What should I tell my health care provider before I take this medicine? They need to know if you have any of these conditions: -infection (especially a virus infection such as chickenpox, cold sores, or herpes) -kidney disease -liver disease -an unusual or allergic  reaction to bendamustine, mannitol, other medicines, foods, dyes, or preservatives -pregnant or trying to get pregnant -breast-feeding How should I use this medicine? This medicine is for infusion into a vein. It is given by a health care professional in a hospital or clinic setting. Talk to your pediatrician regarding the use of this medicine in children. Special care may  be needed. Overdosage: If you think you have taken too much of this medicine contact a poison control center or emergency room at once. NOTE: This medicine is only for you. Do not share this medicine with others. What if I miss a dose? It is important not to miss your dose. Call your doctor or health care professional if you are unable to keep an appointment. What may interact with this medicine? Do not take this medicine with any of the following medications: -clozapine This medicine may also interact with the following medications: -atazanavir -cimetidine -ciprofloxacin -enoxacin -fluvoxamine -medicines for seizures like carbamazepine and phenobarbital -mexiletine -rifampin -tacrine -thiabendazole -zileuton This list may not describe all possible interactions. Give your health care provider a list of all the medicines, herbs, non-prescription drugs, or dietary supplements you use. Also tell them if you smoke, drink alcohol, or use illegal drugs. Some items may interact with your medicine. What should I watch for while using this medicine? This drug may make you feel generally unwell. This is not uncommon, as chemotherapy can affect healthy cells as well as cancer cells. Report any side effects. Continue your course of treatment even though you feel ill unless your doctor tells you to stop. You may need blood work done while you are taking this medicine. Call your doctor or health care professional for advice if you get a fever, chills or sore throat, or other symptoms of a cold or flu. Do not treat yourself. This drug decreases your body's ability to fight infections. Try to avoid being around people who are sick. This medicine may increase your risk to bruise or bleed. Call your doctor or health care professional if you notice any unusual bleeding. Talk to your doctor about your risk of cancer. You may be more at risk for certain types of cancers if you take this medicine. Do not become  pregnant while taking this medicine or for 3 months after stopping it. Women should inform their doctor if they wish to become pregnant or think they might be pregnant. Men should not father a child while taking this medicine and for 3 months after stopping it.There is a potential for serious side effects to an unborn child. Talk to your health care professional or pharmacist for more information. Do not breast-feed an infant while taking this medicine. This medicine may interfere with the ability to have a child. You should talk with your doctor or health care professional if you are concerned about your fertility. What side effects may I notice from receiving this medicine? Side effects that you should report to your doctor or health care professional as soon as possible: -allergic reactions like skin rash, itching or hives, swelling of the face, lips, or tongue -low blood counts - this medicine may decrease the number of white blood cells, red blood cells and platelets. You may be at increased risk for infections and bleeding. -redness, blistering, peeling or loosening of the skin, including inside the mouth -signs of infection - fever or chills, cough, sore throat, pain or difficulty passing urine -signs of decreased platelets or bleeding - bruising, pinpoint red spots on the skin,  black, tarry stools, blood in the urine -signs of decreased red blood cells - unusually weak or tired, fainting spells, lightheadedness -signs and symptoms of kidney injury like trouble passing urine or change in the amount of urine Side effects that usually do not require medical attention (report to your doctor or health care professional if they continue or are bothersome): -constipation -decreased appetite -diarrhea -headache -mouth sores -nausea/vomiting -tiredness This list may not describe all possible side effects. Call your doctor for medical advice about side effects. You may report side effects to FDA at  1-800-FDA-1088. Where should I keep my medicine? This drug is given in a hospital or clinic and will not be stored at home. NOTE: This sheet is a summary. It may not cover all possible information. If you have questions about this medicine, talk to your doctor, pharmacist, or health care provider.    2016, Elsevier/Gold Standard. (2014-08-06 13:53:28)

## 2016-01-03 NOTE — Progress Notes (Signed)
OK to treat today with CBC, CMET from 12/15/15   VO given and read back Dr. Kern Reap RN Patient c/o burning with bendamustine.  Site looks excellent.  Excellent blood return.  Rate slowed and burning is much better.  Pt. Completed bendamustine without further problems.  Peripheral IV flushed with 50cc of NS post bendamustine

## 2016-01-04 ENCOUNTER — Ambulatory Visit (HOSPITAL_BASED_OUTPATIENT_CLINIC_OR_DEPARTMENT_OTHER): Payer: Medicare Other

## 2016-01-04 ENCOUNTER — Telehealth: Payer: Self-pay

## 2016-01-04 ENCOUNTER — Encounter: Payer: Self-pay | Admitting: Pharmacist

## 2016-01-04 ENCOUNTER — Ambulatory Visit: Payer: Medicare Other

## 2016-01-04 VITALS — BP 145/56 | HR 60 | Temp 97.8°F | Resp 22

## 2016-01-04 DIAGNOSIS — Z5111 Encounter for antineoplastic chemotherapy: Secondary | ICD-10-CM

## 2016-01-04 DIAGNOSIS — C8335 Diffuse large B-cell lymphoma, lymph nodes of inguinal region and lower limb: Secondary | ICD-10-CM | POA: Diagnosis not present

## 2016-01-04 LAB — HEPATITIS B CORE ANTIBODY, TOTAL: HEP B C TOTAL AB: POSITIVE — AB

## 2016-01-04 LAB — HEPATITIS B SURFACE ANTIGEN: HBsAg Screen: NEGATIVE

## 2016-01-04 MED ORDER — SODIUM CHLORIDE 0.9 % IV SOLN
Freq: Once | INTRAVENOUS | Status: AC
  Administered 2016-01-04: 15:00:00 via INTRAVENOUS

## 2016-01-04 MED ORDER — SODIUM CHLORIDE 0.9 % IV SOLN
10.0000 mg | Freq: Once | INTRAVENOUS | Status: AC
  Administered 2016-01-04: 10 mg via INTRAVENOUS
  Filled 2016-01-04: qty 1

## 2016-01-04 MED ORDER — SODIUM CHLORIDE 0.9 % IV SOLN
90.0000 mg/m2 | Freq: Once | INTRAVENOUS | Status: AC
  Administered 2016-01-04: 175 mg via INTRAVENOUS
  Filled 2016-01-04: qty 7

## 2016-01-04 NOTE — Telephone Encounter (Signed)
-----   Message from Ignacia Felling, RN sent at 01/03/2016  9:17 AM EDT ----- Regarding: Dr. Benay Spice chemo follow up call 1st rituxan/bendamustine   Pt. Phone [phone]

## 2016-01-04 NOTE — Patient Instructions (Signed)

## 2016-01-04 NOTE — Telephone Encounter (Signed)
Called Kelly Ray for chemotherapy F/U.  Left message for pt to call if they have any questions or concerns or any new symptoms to report, along with the phone number.

## 2016-01-05 ENCOUNTER — Telehealth: Payer: Self-pay | Admitting: Oncology

## 2016-01-05 ENCOUNTER — Encounter: Payer: Self-pay | Admitting: *Deleted

## 2016-01-05 NOTE — Telephone Encounter (Signed)
Returned call and s.w. Pt and confirmed appt...the patient ok and aware

## 2016-01-05 NOTE — Progress Notes (Signed)
Phone call from pt inquiring how often her treatments will be, pt states that she thought Dr. Benay Spice told her every 2 weeks.  Call placed back to pt and pt informed per Dr. Benay Spice, treatments will be every 28 days.  Pt appreciative of call and has no further questions or concerns at this time.

## 2016-01-12 ENCOUNTER — Telehealth: Payer: Self-pay | Admitting: *Deleted

## 2016-01-12 NOTE — Telephone Encounter (Signed)
I received a call in reference to an appointment on Saturday 01-14-2016.  I did not think Dr. Benay Spice see's patients on Saturdays."  Advised the appointment is Tuesday Jan 17, 2016.  No further questions.

## 2016-01-17 ENCOUNTER — Ambulatory Visit (HOSPITAL_BASED_OUTPATIENT_CLINIC_OR_DEPARTMENT_OTHER): Payer: Medicare Other | Admitting: Nurse Practitioner

## 2016-01-17 ENCOUNTER — Telehealth: Payer: Self-pay | Admitting: Oncology

## 2016-01-17 ENCOUNTER — Other Ambulatory Visit (HOSPITAL_BASED_OUTPATIENT_CLINIC_OR_DEPARTMENT_OTHER): Payer: Medicare Other

## 2016-01-17 VITALS — BP 143/63 | HR 62 | Temp 97.8°F | Resp 18 | Ht 62.5 in | Wt 195.6 lb

## 2016-01-17 DIAGNOSIS — E039 Hypothyroidism, unspecified: Secondary | ICD-10-CM | POA: Diagnosis not present

## 2016-01-17 DIAGNOSIS — C8335 Diffuse large B-cell lymphoma, lymph nodes of inguinal region and lower limb: Secondary | ICD-10-CM

## 2016-01-17 DIAGNOSIS — E7800 Pure hypercholesterolemia, unspecified: Secondary | ICD-10-CM | POA: Diagnosis not present

## 2016-01-17 DIAGNOSIS — I10 Essential (primary) hypertension: Secondary | ICD-10-CM

## 2016-01-17 DIAGNOSIS — M1990 Unspecified osteoarthritis, unspecified site: Secondary | ICD-10-CM

## 2016-01-17 LAB — CBC WITH DIFFERENTIAL/PLATELET
BASO%: 0.8 % (ref 0.0–2.0)
BASOS ABS: 0 10*3/uL (ref 0.0–0.1)
EOS%: 1.7 % (ref 0.0–7.0)
Eosinophils Absolute: 0.1 10*3/uL (ref 0.0–0.5)
HCT: 39.7 % (ref 34.8–46.6)
HGB: 13.2 g/dL (ref 11.6–15.9)
LYMPH#: 0.5 10*3/uL — AB (ref 0.9–3.3)
LYMPH%: 8.4 % — AB (ref 14.0–49.7)
MCH: 29.4 pg (ref 25.1–34.0)
MCHC: 33.2 g/dL (ref 31.5–36.0)
MCV: 88.6 fL (ref 79.5–101.0)
MONO#: 0.8 10*3/uL (ref 0.1–0.9)
MONO%: 12.3 % (ref 0.0–14.0)
NEUT#: 5 10*3/uL (ref 1.5–6.5)
NEUT%: 76.8 % (ref 38.4–76.8)
Platelets: 218 10*3/uL (ref 145–400)
RBC: 4.48 10*6/uL (ref 3.70–5.45)
RDW: 14 % (ref 11.2–14.5)
WBC: 6.5 10*3/uL (ref 3.9–10.3)

## 2016-01-17 NOTE — Telephone Encounter (Signed)
Gave and printed appt sched and avs for pt for June °

## 2016-01-17 NOTE — Progress Notes (Addendum)
  [place] OFFICE PROGRESS NOTE   Diagnosis:  Non-Hodgkin's lymphoma  INTERVAL HISTORY:   Ms. Weatherly returns as scheduled. She completed cycle 1 bendamustine/Rituxan beginning 01/03/2016. She had mild nausea for 4-5 days. She took Compazine 2 times with good relief. No vomiting. No mouth sores. She noted constipation after the chemotherapy. She had a bowel movement on day 3. She feels the leg lesions are better. No signs of allergic reaction with the Rituxan.  Objective:  Vital signs in last 24 hours:  Blood pressure 143/63, pulse 62, temperature 97.8 F (36.6 C), temperature source Oral, resp. rate 18, height 5' 2.5" (1.588 m), weight 195 lb 9.6 oz (88.724 kg), SpO2 98 %.    HEENT: No thrush or ulcers. Lymphatics: No palpable cervical or supraclavicular lymph nodes. Resp: Lungs clear bilaterally. Cardio: Regular rate and rhythm. GI: Abdomen soft and nontender. No organomegaly. Vascular: No leg edema. Skin: The right lower leg lesions are flatter, dry appearing. The small lesions at the posterior leg have nearly resolved.   Lab Results:  Lab Results  Component Value Date   WBC 6.5 01/17/2016   HGB 13.2 01/17/2016   HCT 39.7 01/17/2016   MCV 88.6 01/17/2016   PLT 218 01/17/2016   NEUTROABS 5.0 01/17/2016    Imaging:  No results found.  Medications: I have reviewed the patient's current medications.  Assessment/Plan: 1. 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 Negative restaging CT evaluation 10/24/2012  Nodular skin lesions at the right lower leg, status post a shave biopsy 07/15/2013 confirming a malignant B-cell lymphoma, diffuse large cell positive for CD20, BCL 6, BCL 2, and CD10   Staging bone marrow biopsy 07/31/2013, negative for involvement with lymphoma   Staging PET scan 07/30/2013-negative.   Status post palliative radiation  right lower leg nodular skin lesions 08/17/2013 through 09/03/2013.  New nodular skin lesions at the right lower leg April 2017, status post biopsies of lesions at the right lower leg 12/07/2015 confirming large B-cell lymphoma, CD20 positive  staging PET scan 12/26/2015-hypermetabolic cutaneous and subcutaneous nodules in the right lower extremity, no other evidence of lymphoma  Cycle 1 bendamustine/Rituxan 01/03/2016 2. Port-A-Cath placement 06/01/2011. Port-A-Cath removal 11/04/2012 3. Hypertension. 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 5. Hypercholesterolemia. 6. Osteoarthritis. 7. Malaise-? Secondary to progression of non-Hodgkin's lymphoma. Improved. 8. Staph infection right lower leg February 2015.   Disposition: Ms. Nusz appears stable. She has completed 1 cycle of bendamustine/Rituxan. The leg lesions appear improved. She will return for cycle 2 on 01/31/2016. We will see her in follow-up on 02/23/2016. She will contact the office in the interim with any problems.  Patient seen with Dr. Benay Spice. 25 minutes were spent face-to-face at today's visit with the majority of that time involved in counseling/coordination of care.  Ned Card ANP/GNP-BC   01/17/2016  9:48 AM  This was a shared visit with Ned Card. Ms. Mullenax was interviewed and examined. The nodular skin lesions have improved following cycle 1 bendamustine/rituximab.  Julieanne Manson, M.D.

## 2016-01-29 ENCOUNTER — Other Ambulatory Visit: Payer: Self-pay | Admitting: Oncology

## 2016-01-31 ENCOUNTER — Ambulatory Visit (HOSPITAL_BASED_OUTPATIENT_CLINIC_OR_DEPARTMENT_OTHER): Payer: Medicare Other

## 2016-01-31 ENCOUNTER — Other Ambulatory Visit (HOSPITAL_BASED_OUTPATIENT_CLINIC_OR_DEPARTMENT_OTHER): Payer: Medicare Other

## 2016-01-31 VITALS — BP 121/64 | HR 58 | Temp 97.8°F | Resp 18

## 2016-01-31 DIAGNOSIS — Z5111 Encounter for antineoplastic chemotherapy: Secondary | ICD-10-CM

## 2016-01-31 DIAGNOSIS — C8333 Diffuse large B-cell lymphoma, intra-abdominal lymph nodes: Secondary | ICD-10-CM | POA: Diagnosis not present

## 2016-01-31 DIAGNOSIS — Z5112 Encounter for antineoplastic immunotherapy: Secondary | ICD-10-CM | POA: Diagnosis not present

## 2016-01-31 DIAGNOSIS — C8335 Diffuse large B-cell lymphoma, lymph nodes of inguinal region and lower limb: Secondary | ICD-10-CM | POA: Diagnosis not present

## 2016-01-31 LAB — CBC WITH DIFFERENTIAL/PLATELET
BASO%: 0.7 % (ref 0.0–2.0)
BASOS ABS: 0 10*3/uL (ref 0.0–0.1)
EOS ABS: 0.1 10*3/uL (ref 0.0–0.5)
EOS%: 2.3 % (ref 0.0–7.0)
HEMATOCRIT: 42 % (ref 34.8–46.6)
HEMOGLOBIN: 14 g/dL (ref 11.6–15.9)
LYMPH%: 8.3 % — ABNORMAL LOW (ref 14.0–49.7)
MCH: 29.5 pg (ref 25.1–34.0)
MCHC: 33.3 g/dL (ref 31.5–36.0)
MCV: 88.8 fL (ref 79.5–101.0)
MONO#: 0.5 10*3/uL (ref 0.1–0.9)
MONO%: 11.8 % (ref 0.0–14.0)
NEUT%: 76.9 % — ABNORMAL HIGH (ref 38.4–76.8)
NEUTROS ABS: 3.2 10*3/uL (ref 1.5–6.5)
PLATELETS: 164 10*3/uL (ref 145–400)
RBC: 4.73 10*6/uL (ref 3.70–5.45)
RDW: 13.9 % (ref 11.2–14.5)
WBC: 4.1 10*3/uL (ref 3.9–10.3)
lymph#: 0.3 10*3/uL — ABNORMAL LOW (ref 0.9–3.3)

## 2016-01-31 LAB — COMPREHENSIVE METABOLIC PANEL
ALT: 15 U/L (ref 0–55)
ANION GAP: 8 meq/L (ref 3–11)
AST: 19 U/L (ref 5–34)
Albumin: 3.6 g/dL (ref 3.5–5.0)
Alkaline Phosphatase: 83 U/L (ref 40–150)
BUN: 13.2 mg/dL (ref 7.0–26.0)
CHLORIDE: 107 meq/L (ref 98–109)
CO2: 25 meq/L (ref 22–29)
CREATININE: 0.9 mg/dL (ref 0.6–1.1)
Calcium: 9.4 mg/dL (ref 8.4–10.4)
EGFR: 65 mL/min/{1.73_m2} — ABNORMAL LOW (ref >90)
Glucose: 113 mg/dl (ref 70–140)
POTASSIUM: 3.9 meq/L (ref 3.5–5.1)
Sodium: 140 mEq/L (ref 136–145)
Total Bilirubin: 0.54 mg/dL (ref 0.20–1.20)
Total Protein: 6.9 g/dL (ref 6.4–8.3)

## 2016-01-31 MED ORDER — PALONOSETRON HCL INJECTION 0.25 MG/5ML
0.2500 mg | Freq: Once | INTRAVENOUS | Status: AC
  Administered 2016-01-31: 0.25 mg via INTRAVENOUS

## 2016-01-31 MED ORDER — SODIUM CHLORIDE 0.9 % IV SOLN
10.0000 mg | Freq: Once | INTRAVENOUS | Status: AC
  Administered 2016-01-31: 10 mg via INTRAVENOUS
  Filled 2016-01-31: qty 1

## 2016-01-31 MED ORDER — SODIUM CHLORIDE 0.9 % IV SOLN
90.0000 mg/m2 | Freq: Once | INTRAVENOUS | Status: AC
  Administered 2016-01-31: 175 mg via INTRAVENOUS
  Filled 2016-01-31: qty 7

## 2016-01-31 MED ORDER — ACETAMINOPHEN 325 MG PO TABS
ORAL_TABLET | ORAL | Status: AC
  Filled 2016-01-31: qty 2

## 2016-01-31 MED ORDER — DIPHENHYDRAMINE HCL 25 MG PO CAPS
ORAL_CAPSULE | ORAL | Status: AC
  Filled 2016-01-31: qty 2

## 2016-01-31 MED ORDER — PALONOSETRON HCL INJECTION 0.25 MG/5ML
INTRAVENOUS | Status: AC
  Filled 2016-01-31: qty 5

## 2016-01-31 MED ORDER — ACETAMINOPHEN 325 MG PO TABS
650.0000 mg | ORAL_TABLET | Freq: Once | ORAL | Status: AC
  Administered 2016-01-31: 650 mg via ORAL

## 2016-01-31 MED ORDER — SODIUM CHLORIDE 0.9 % IV SOLN
375.0000 mg/m2 | Freq: Once | INTRAVENOUS | Status: AC
  Administered 2016-01-31: 700 mg via INTRAVENOUS
  Filled 2016-01-31: qty 60

## 2016-01-31 MED ORDER — DIPHENHYDRAMINE HCL 25 MG PO CAPS
50.0000 mg | ORAL_CAPSULE | Freq: Once | ORAL | Status: AC
  Administered 2016-01-31: 50 mg via ORAL

## 2016-01-31 MED ORDER — SODIUM CHLORIDE 0.9 % IV SOLN
Freq: Once | INTRAVENOUS | Status: AC
  Administered 2016-01-31: 10:00:00 via INTRAVENOUS

## 2016-01-31 NOTE — Progress Notes (Signed)
Patient complained of some mild burning at the IV site about 30 ml into the infusion. IV site is not red or swollen and there are no signs of phlebitis or infiltration. Flushes easily. The vein that was accessed was small and a 24 g was used because she has poor venous access. Gave patient a warm compress and instructed to let me know if it continues to hurt. Infusion restarted and she states that it is feeling better. Will continue to monitor IV site.

## 2016-01-31 NOTE — Patient Instructions (Signed)
[facility] Discharge Instructions for Patients Receiving Chemotherapy  Today you received the following chemotherapy agents: Rituxan and Bendeka.   To help prevent nausea and vomiting after your treatment, we encourage you to take your nausea medication as prescribed.    If you develop nausea and vomiting that is not controlled by your nausea medication, call the clinic.   BELOW ARE SYMPTOMS THAT SHOULD BE REPORTED IMMEDIATELY:  *FEVER GREATER THAN 100.5 F  *CHILLS WITH OR WITHOUT FEVER  NAUSEA AND VOMITING THAT IS NOT CONTROLLED WITH YOUR NAUSEA MEDICATION  *UNUSUAL SHORTNESS OF BREATH  *UNUSUAL BRUISING OR BLEEDING  TENDERNESS IN MOUTH AND THROAT WITH OR WITHOUT PRESENCE OF ULCERS  *URINARY PROBLEMS  *BOWEL PROBLEMS  UNUSUAL RASH Items with * indicate a potential emergency and should be followed up as soon as possible.  Feel free to call the clinic you have any questions or concerns. The clinic phone number is [phone].  Please show the CHEMO ALERT CARD at check-in to the Emergency Department and triage nurse.   

## 2016-02-01 ENCOUNTER — Ambulatory Visit (HOSPITAL_BASED_OUTPATIENT_CLINIC_OR_DEPARTMENT_OTHER): Payer: Medicare Other

## 2016-02-01 VITALS — BP 129/71 | HR 61 | Temp 98.2°F | Resp 18

## 2016-02-01 DIAGNOSIS — Z5112 Encounter for antineoplastic immunotherapy: Secondary | ICD-10-CM

## 2016-02-01 DIAGNOSIS — C8335 Diffuse large B-cell lymphoma, lymph nodes of inguinal region and lower limb: Secondary | ICD-10-CM

## 2016-02-01 MED ORDER — SODIUM CHLORIDE 0.9 % IV SOLN
10.0000 mg | Freq: Once | INTRAVENOUS | Status: AC
  Administered 2016-02-01: 10 mg via INTRAVENOUS
  Filled 2016-02-01: qty 1

## 2016-02-01 MED ORDER — SODIUM CHLORIDE 0.9 % IV SOLN
90.0000 mg/m2 | Freq: Once | INTRAVENOUS | Status: AC
  Administered 2016-02-01: 175 mg via INTRAVENOUS
  Filled 2016-02-01: qty 7

## 2016-02-01 MED ORDER — SODIUM CHLORIDE 0.9 % IV SOLN
Freq: Once | INTRAVENOUS | Status: AC
  Administered 2016-02-01: 11:00:00 via INTRAVENOUS

## 2016-02-01 NOTE — Patient Instructions (Signed)
[facility] Discharge Instructions for Patients Receiving Chemotherapy  Today you received the following chemotherapy agents: Bendamustine.  To help prevent nausea and vomiting after your treatment, we encourage you to take your nausea medications:Compazine 10 mg every 6 hours as needed.   If you develop nausea and vomiting that is not controlled by your nausea medication, call the clinic.   BELOW ARE SYMPTOMS THAT SHOULD BE REPORTED IMMEDIATELY:  *FEVER GREATER THAN 100.5 F  *CHILLS WITH OR WITHOUT FEVER  NAUSEA AND VOMITING THAT IS NOT CONTROLLED WITH YOUR NAUSEA MEDICATION  *UNUSUAL SHORTNESS OF BREATH  *UNUSUAL BRUISING OR BLEEDING  TENDERNESS IN MOUTH AND THROAT WITH OR WITHOUT PRESENCE OF ULCERS  *URINARY PROBLEMS  *BOWEL PROBLEMS  UNUSUAL RASH Items with * indicate a potential emergency and should be followed up as soon as possible.  Feel free to call the clinic you have any questions or concerns. The clinic phone number is [phone].  Please show the [place] at check-in to the Emergency Department and triage nurse.

## 2016-02-09 ENCOUNTER — Telehealth: Payer: Self-pay | Admitting: *Deleted

## 2016-02-09 ENCOUNTER — Other Ambulatory Visit (HOSPITAL_BASED_OUTPATIENT_CLINIC_OR_DEPARTMENT_OTHER): Payer: Medicare Other

## 2016-02-09 ENCOUNTER — Other Ambulatory Visit: Payer: Self-pay | Admitting: Emergency Medicine

## 2016-02-09 ENCOUNTER — Telehealth: Payer: Self-pay | Admitting: Oncology

## 2016-02-09 ENCOUNTER — Ambulatory Visit (HOSPITAL_BASED_OUTPATIENT_CLINIC_OR_DEPARTMENT_OTHER): Payer: Medicare Other | Admitting: Oncology

## 2016-02-09 VITALS — BP 123/61 | HR 71 | Temp 98.0°F | Resp 18 | Ht 62.5 in | Wt 194.5 lb

## 2016-02-09 DIAGNOSIS — M1990 Unspecified osteoarthritis, unspecified site: Secondary | ICD-10-CM

## 2016-02-09 DIAGNOSIS — E039 Hypothyroidism, unspecified: Secondary | ICD-10-CM | POA: Diagnosis not present

## 2016-02-09 DIAGNOSIS — C8335 Diffuse large B-cell lymphoma, lymph nodes of inguinal region and lower limb: Secondary | ICD-10-CM

## 2016-02-09 DIAGNOSIS — L989 Disorder of the skin and subcutaneous tissue, unspecified: Secondary | ICD-10-CM

## 2016-02-09 DIAGNOSIS — I10 Essential (primary) hypertension: Secondary | ICD-10-CM | POA: Diagnosis not present

## 2016-02-09 DIAGNOSIS — E7800 Pure hypercholesterolemia, unspecified: Secondary | ICD-10-CM

## 2016-02-09 LAB — CBC WITH DIFFERENTIAL/PLATELET
BASO%: 0.3 % (ref 0.0–2.0)
Basophils Absolute: 0 10*3/uL (ref 0.0–0.1)
EOS%: 2.9 % (ref 0.0–7.0)
Eosinophils Absolute: 0.1 10*3/uL (ref 0.0–0.5)
HEMATOCRIT: 36.6 % (ref 34.8–46.6)
HGB: 12.4 g/dL (ref 11.6–15.9)
LYMPH#: 0.6 10*3/uL — AB (ref 0.9–3.3)
LYMPH%: 15.6 % (ref 14.0–49.7)
MCH: 29.9 pg (ref 25.1–34.0)
MCHC: 33.9 g/dL (ref 31.5–36.0)
MCV: 88.2 fL (ref 79.5–101.0)
MONO#: 0.5 10*3/uL (ref 0.1–0.9)
MONO%: 12.5 % (ref 0.0–14.0)
NEUT%: 68.7 % (ref 38.4–76.8)
NEUTROS ABS: 2.7 10*3/uL (ref 1.5–6.5)
PLATELETS: 152 10*3/uL (ref 145–400)
RBC: 4.15 10*6/uL (ref 3.70–5.45)
RDW: 13.6 % (ref 11.2–14.5)
WBC: 3.9 10*3/uL (ref 3.9–10.3)

## 2016-02-09 LAB — COMPREHENSIVE METABOLIC PANEL
ALBUMIN: 3.4 g/dL — AB (ref 3.5–5.0)
ALT: 15 U/L (ref 0–55)
ANION GAP: 9 meq/L (ref 3–11)
AST: 21 U/L (ref 5–34)
Alkaline Phosphatase: 72 U/L (ref 40–150)
BILIRUBIN TOTAL: 0.58 mg/dL (ref 0.20–1.20)
BUN: 14.6 mg/dL (ref 7.0–26.0)
CALCIUM: 8.6 mg/dL (ref 8.4–10.4)
CO2: 29 meq/L (ref 22–29)
CREATININE: 0.8 mg/dL (ref 0.6–1.1)
Chloride: 103 mEq/L (ref 98–109)
EGFR: 71 mL/min/{1.73_m2} — ABNORMAL LOW (ref >90)
Glucose: 150 mg/dl — ABNORMAL HIGH (ref 70–140)
Potassium: 4.3 mEq/L (ref 3.5–5.1)
Sodium: 140 mEq/L (ref 136–145)
TOTAL PROTEIN: 6.2 g/dL — AB (ref 6.4–8.3)

## 2016-02-09 NOTE — Progress Notes (Signed)
  [facility] OFFICE PROGRESS NOTE   Diagnosis: Non-Hodgkin's lymphoma  INTERVAL HISTORY:   [name] returns prior to a scheduled visit. She completed a second cycle of bendamustine/rituximab beginning 01/31/2016. She had mild nausea following chemotherapy. She has noted drainage and discomfort at a right lower leg lesion since yesterday. She is concerned the lesion may be infected. The other skin nodules remain flatter.  Objective:  Vital signs in last 24 hours:  Blood pressure 123/61, pulse 71, temperature 98 F (36.7 C), temperature source Oral, resp. rate 18, height 5' 2.5" (1.588 m), weight 194 lb 8 oz (88.225 kg), SpO2 99 %.    Skin: Purplish flat lesions at the right tibia and calf. Dominant lesion at the right calf with nodularity, central eschar, and purulent drainage. I was able to express a small amount of pus from this lesion.  Lab Results:  Lab Results  Component Value Date   WBC 3.9 02/09/2016   HGB 12.4 02/09/2016   HCT 36.6 02/09/2016   MCV 88.2 02/09/2016   PLT 152 02/09/2016   NEUTROABS 2.7 02/09/2016     Medications: I have reviewed the patient's current medications.  Assessment/Plan: 1. 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 Negative restaging CT evaluation 10/24/2012  Nodular skin lesions at the right lower leg, status post a shave biopsy 07/15/2013 confirming a malignant B-cell lymphoma, diffuse large cell positive for CD20, BCL 6, BCL 2, and CD10   Staging bone marrow biopsy 07/31/2013, negative for involvement with lymphoma   Staging PET scan 07/30/2013-negative.   Status post palliative radiation right lower leg nodular skin lesions 08/17/2013 through 09/03/2013.  New nodular skin lesions at the right lower leg April 2017, status post biopsies of lesions at the right lower leg 12/07/2015 confirming large B-cell lymphoma,  CD20 positive  staging PET scan 12/26/2015-hypermetabolic cutaneous and subcutaneous nodules in the right lower extremity, no other evidence of lymphoma  Cycle 1 bendamustine/Rituxan 01/03/2016  Cycle 2 bendamustine/Rituxan 01/31/2016 2. Port-A-Cath placement 06/01/2011. Port-A-Cath removal 11/04/2012 3. Hypertension. 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 5. Hypercholesterolemia. 6. Osteoarthritis. 7. Malaise-? Secondary to progression of non-Hodgkin's lymphoma. Improved. 8. Staph infection right lower leg February 2015. 9. Right calf lesion with purulent drainage 02/09/2016, culture obtained, doxycycline prescribed   Disposition:  Ms. Borger has completed 2 cycles of bendamustine/rituximab. The right calf lesion now has a purulent drainage. The lesion is painful. We sent a culture from the wound drainage today and she will complete a course of doxycycline. Ms. Delano will contact us for increased erythema surrounding the lesion or fever. She will return for an office visit as scheduled.  Betsy Coder, MD  02/09/2016  2:59 PM

## 2016-02-09 NOTE — Telephone Encounter (Signed)
left msg confirming added apt for today per pof

## 2016-02-09 NOTE — Telephone Encounter (Signed)
Message from pt reporting yellowish pus draining from biopsy site on leg. There is no redness around site. Area remains swollen. Pt is concerned about waiting 2 weeks to have site evaluated. Discussed with Dr. Benay Spice: Order received for office visit today. Pt will be worked in this afternoon.

## 2016-02-10 ENCOUNTER — Other Ambulatory Visit: Payer: Self-pay | Admitting: *Deleted

## 2016-02-10 MED ORDER — DOXYCYCLINE HYCLATE 100 MG PO TABS: 100.0000 mg | ORAL_TABLET | Freq: Two times a day (BID) | ORAL | Status: DC

## 2016-02-10 NOTE — Telephone Encounter (Signed)
Notified pt that doxycycline Rx has been sent to [facility].

## 2016-02-11 LAB — WOUND CULTURE

## 2016-02-14 ENCOUNTER — Telehealth: Payer: Self-pay | Admitting: *Deleted

## 2016-02-14 NOTE — Telephone Encounter (Signed)
-----   Message from [name], MD sent at 02/13/2016  8:14 PM EDT ----- Please call patient, culture shows strep infection, call if leg not better and we will prescribe penicillin

## 2016-02-14 NOTE — Telephone Encounter (Signed)
Called pt with lab results, culture shows strep infection. She reports wound is no better yet, will call with an update later this week.

## 2016-02-17 ENCOUNTER — Telehealth: Payer: Self-pay | Admitting: Medical Oncology

## 2016-02-17 NOTE — Telephone Encounter (Signed)
F/U call to pt . Pt reports she takes her last dose of Doxycycline today , Bx site with decreased redness , still weeping and swollen  with yellow drainage and some scabbing.Denies fever or chills . Per Dr Benay Spice I told pt he is not going to prescribe anymore antibiotics and to keep appt Thursday. Pt appreciated call.

## 2016-02-23 ENCOUNTER — Ambulatory Visit: Payer: Medicare Other

## 2016-02-23 ENCOUNTER — Other Ambulatory Visit (HOSPITAL_BASED_OUTPATIENT_CLINIC_OR_DEPARTMENT_OTHER): Payer: Medicare Other

## 2016-02-23 ENCOUNTER — Telehealth: Payer: Self-pay | Admitting: Oncology

## 2016-02-23 ENCOUNTER — Ambulatory Visit (HOSPITAL_BASED_OUTPATIENT_CLINIC_OR_DEPARTMENT_OTHER): Payer: Medicare Other | Admitting: Oncology

## 2016-02-23 VITALS — BP 125/54 | HR 66 | Temp 97.6°F | Resp 18 | Ht 62.5 in | Wt 192.5 lb

## 2016-02-23 DIAGNOSIS — C8335 Diffuse large B-cell lymphoma, lymph nodes of inguinal region and lower limb: Secondary | ICD-10-CM

## 2016-02-23 DIAGNOSIS — E7800 Pure hypercholesterolemia, unspecified: Secondary | ICD-10-CM

## 2016-02-23 DIAGNOSIS — M1990 Unspecified osteoarthritis, unspecified site: Secondary | ICD-10-CM

## 2016-02-23 DIAGNOSIS — I10 Essential (primary) hypertension: Secondary | ICD-10-CM

## 2016-02-23 DIAGNOSIS — E039 Hypothyroidism, unspecified: Secondary | ICD-10-CM

## 2016-02-23 DIAGNOSIS — L989 Disorder of the skin and subcutaneous tissue, unspecified: Secondary | ICD-10-CM | POA: Diagnosis not present

## 2016-02-23 LAB — CBC WITH DIFFERENTIAL/PLATELET
BASO%: 0.6 % (ref 0.0–2.0)
Basophils Absolute: 0 10*3/uL (ref 0.0–0.1)
EOS%: 1.9 % (ref 0.0–7.0)
Eosinophils Absolute: 0.1 10*3/uL (ref 0.0–0.5)
HCT: 36.4 % (ref 34.8–46.6)
HEMOGLOBIN: 12.4 g/dL (ref 11.6–15.9)
LYMPH#: 0.9 10*3/uL (ref 0.9–3.3)
LYMPH%: 28.3 % (ref 14.0–49.7)
MCH: 30.2 pg (ref 25.1–34.0)
MCHC: 34.1 g/dL (ref 31.5–36.0)
MCV: 88.6 fL (ref 79.5–101.0)
MONO#: 0.6 10*3/uL (ref 0.1–0.9)
MONO%: 19.7 % — ABNORMAL HIGH (ref 0.0–14.0)
NEUT%: 49.5 % (ref 38.4–76.8)
NEUTROS ABS: 1.6 10*3/uL (ref 1.5–6.5)
Platelets: 167 10*3/uL (ref 145–400)
RBC: 4.11 10*6/uL (ref 3.70–5.45)
RDW: 14.6 % — AB (ref 11.2–14.5)
WBC: 3.1 10*3/uL — ABNORMAL LOW (ref 3.9–10.3)

## 2016-02-23 LAB — COMPREHENSIVE METABOLIC PANEL
ALT: 17 U/L (ref 0–55)
AST: 24 U/L (ref 5–34)
Albumin: 3.1 g/dL — ABNORMAL LOW (ref 3.5–5.0)
Alkaline Phosphatase: 84 U/L (ref 40–150)
Anion Gap: 7 mEq/L (ref 3–11)
BUN: 18 mg/dL (ref 7.0–26.0)
CALCIUM: 8.6 mg/dL (ref 8.4–10.4)
CHLORIDE: 107 meq/L (ref 98–109)
CO2: 27 mEq/L (ref 22–29)
Creatinine: 0.7 mg/dL (ref 0.6–1.1)
EGFR: 87 mL/min/{1.73_m2} — ABNORMAL LOW (ref >90)
GLUCOSE: 95 mg/dL (ref 70–140)
POTASSIUM: 3.8 meq/L (ref 3.5–5.1)
SODIUM: 140 meq/L (ref 136–145)
Total Bilirubin: 0.59 mg/dL (ref 0.20–1.20)
Total Protein: 5.7 g/dL — ABNORMAL LOW (ref 6.4–8.3)

## 2016-02-23 NOTE — Progress Notes (Signed)
[facility] OFFICE PROGRESS NOTE   Diagnosis: Non-Hodgkin's lymphoma  INTERVAL HISTORY:   Kelly Ray returns as scheduled. She feels well. No fever or night sweats. No new skin lesions. She has noted enlargement of the lesion at the right tibia and also one lesion at the right calf. Other Lesions have resolved.  Objective:  Vital signs in last 24 hours:  Blood pressure 125/54, pulse 66, temperature 97.6 F (36.4 C), temperature source Oral, resp. rate 18, height 5' 2.5" (1.588 m), weight 192 lb 8 oz (87.317 kg), SpO2 98 %.    HEENT: No thrush or ulcers Lymphatics: No cervical, supraclavicular, axillary, or inguinal nodes Resp: Lungs clear bilaterally Cardio: Regular rate and rhythm GI: No hepatosplenomegaly, no mass Vascular: No leg edema  Skin: 2-3 cm nodular lesion at the right pretibial area with a central eschar, 2 cm nodular lesion at the upper calf, inferior to this lesion there is a 1 cm subcutaneous nodule. No other palpable skin lesions.   Portacath/PICC-without erythema  Lab Results:  Lab Results  Component Value Date   WBC 3.1* 02/23/2016   HGB 12.4 02/23/2016   HCT 36.4 02/23/2016   MCV 88.6 02/23/2016   PLT 167 02/23/2016   NEUTROABS 1.6 02/23/2016     Medications: I have reviewed the patient's current medications.  Assessment/Plan: 1. 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 Negative restaging CT evaluation 10/24/2012  Nodular skin lesions at the right lower leg, status post a shave biopsy 07/15/2013 confirming a malignant B-cell lymphoma, diffuse large cell positive for CD20, BCL 6, BCL 2, and CD10   Staging bone marrow biopsy 07/31/2013, negative for involvement with lymphoma   Staging PET scan 07/30/2013-negative.   Status post palliative radiation right lower leg nodular skin lesions 08/17/2013 through 09/03/2013.  New  nodular skin lesions at the right lower leg April 2017, status postpsies of lesions at the right lower leg 12/07/2015 confirming large B-cell lymphoma, CD20 positive bio  staging PET scan 12/26/2015-hypermetabolic cutaneous and subcutaneous nodules in the right lower extremity, no other evidence of lymphoma  Cycle 1 bendamustine/Rituxan 01/03/2016  Cycle 2 bendamustine/Rituxan 01/31/2016  Clinical progression with enlargement of multiple right lower leg skin lesions 02/23/2016 2. Port-A-Cath placement 06/01/2011. Port-A-Cath removal 11/04/2012 3. Hypertension. 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 5. Hypercholesterolemia. 6. Osteoarthritis. 7. Malaise-? Secondary to progression of non-Hodgkin's lymphoma. Improved. 8. Staph infection right lower leg February 2015. 9. Right calf lesion with purulent drainage 02/09/2016, culture obtained-group B strep, doxycycline prescribed    Disposition:  Kelly Ray has completed 2 cycles of bendamustine/rituximab. A few of the nodular lesions at the right calf have resolved, but there has been progression of a dominant lesion at the tibia and 2 lesions at the right calf.  I discussed treatment options with Kelly Ray and her husband. I discussed salvage chemotherapy regimens. A standard approach would be to deliver [facility] or ICE. She does not wish to be hospitalized for chemotherapy and would prefer another trial of radiation. She had a prolonged remission after receiving radiation in 2014.  I will make a referral to radiation oncology to consider palliative radiation to the right tibia and calf. Hopefully this will yield a durable remission. She understands the high likelihood of systemic progression in the future.  Kelly Ray will return for an office visit at the completion of radiation.   Betsy Coder, MD  02/23/2016  9:43 AM

## 2016-02-23 NOTE — Telephone Encounter (Signed)
per pof to sch pt appt-gave pt copy of avs °

## 2016-02-24 ENCOUNTER — Ambulatory Visit: Payer: Medicare Other

## 2016-03-02 NOTE — Progress Notes (Signed)
Lymphoma Location(s) / Histology: Atypical Lymphoid Hyperplasia, Favor Large B Cell Lymphoma  Kelly Ray presented on 02/23/16 with report of enlargement of lesions of Right Tibia, and Right Calf  Biopsies 12/07/15 Diagnosis 1. Skin , left scalp, post, shave SQUAMOUS CELL CARCINOMA IN SITU, CRUSTED 2. Skin , left scalp, ant, shave SQUAMOUS CELL CARCINOMA IN SITU, CRUSTED 3. Skin , left low shin, shave BOWEN' S DISEASE (SQUAMOUS CELL CARCINOMA IN SITU) 4. Skin , right upper low leg, shave ATYPICAL LYMPHOID HYPERPLASIA, FAVOR LARGE B CELL LYMPHOMA. SEE DESCRIPTION 5. Skin , right medial low leg, shave ATYPICAL LYMPHOID HYPERPLASIA, FAVOR LARGE B CELL LYMPHOMA. SEE DESCRIPTION  Past/Anticipated interventions by medical oncology, if any: Dr. Julieanne Manson - Kelly Ray declined salvage chemotherapy at this time.  completed 2 cycles of of Bendamustine/Rituximab to consider palliative radiation to right tibia and calf  Weight changes, if any, over the past 6 months:No  Recurrent fevers, or drenching night sweats, if any: None  SAFETY ISSUES:  Prior radiation? Posterior right lower leg, 3600 cGy in 12 sessions- 08/17/2013 through 01/08/2015Dr. Glory Rosebush    Pacemaker/ICD? NO  Possible current pregnancy? No  Is the patient on methotrexate? No  Current Complaints / other details:  Married,  Together  7 children, 9  grandkids, 2 great children Wt Readings from Last 3 Encounters:  02/23/16 192 lb 8 oz (87.317 kg)  02/09/16 194 lb 8 oz (88.225 kg)  01/17/16 195 lb 9.6 oz (88.724 kg)    Open sore right calf, 2 other red spots  BP 108/72 mmHg  Pulse 98  Temp(Src) 98.5 F (36.9 C) (Oral)  Resp 16  Ht 5\' 2"  (1.575 m)  Wt 193 lb 1.6 oz (87.59 kg)  BMI 35.31 kg/m2

## 2016-03-05 ENCOUNTER — Ambulatory Visit
Admission: RE | Admit: 2016-03-05 | Discharge: 2016-03-05 | Disposition: A | Discharge status: Home / Self Care | Payer: Medicare Other | Source: Ambulatory Visit | Attending: Radiation Oncology | Admitting: Radiation Oncology

## 2016-03-05 ENCOUNTER — Encounter: Payer: Self-pay | Admitting: Radiation Oncology

## 2016-03-05 VITALS — BP 108/72 | HR 98 | Temp 98.5°F | Resp 16 | Ht 62.0 in | Wt 193.1 lb

## 2016-03-05 DIAGNOSIS — C8335 Diffuse large B-cell lymphoma, lymph nodes of inguinal region and lower limb: Secondary | ICD-10-CM

## 2016-03-05 DIAGNOSIS — C8339 Diffuse large B-cell lymphoma, extranodal and solid organ sites: Secondary | ICD-10-CM

## 2016-03-05 DIAGNOSIS — Z510 Encounter for antineoplastic radiation therapy: Secondary | ICD-10-CM | POA: Insufficient documentation

## 2016-03-05 NOTE — Progress Notes (Signed)
Please see the Nurse Progress Note in the MD Initial Consult Encounter for this patient. 

## 2016-03-05 NOTE — Progress Notes (Signed)
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 9 MEV electrons, en face with 0.8 cm bolus to maximize the dose to the skin surface.  Narrative:  The patient returns today for a re-evaluation. She is being followed by Dr. Benay Spice for stage II high-grade diffuse large B-cell lymphoma, CD20, CD79a and CD10 positive status post 6 cycles of CHOP/Rituxan 06/06/2011 through 09/19/2011. Dr. Everlene Farrier noted nodular lesions involving the right lower leg and a shave biopsy by Dr. Denna Haggard on 07/15/2013 confirmed a malignant B-cell lymphoma, diffuse large cell positive for CD20, and CD10. The patient was referred to Dr. Valere Dross on 08/12/13 for palliative radiation to this area. Radiation was 08/17/2013 - 09/03/2013.  In March 2017, the patient noticed new nodular skin lesions of the right lower leg. The patient had shave biopsies on 12/07/15 revealing atypical lymphoid hyperplasia favoring large B-cell lymphoma, CD20 positive, on the right upper lower leg and right medial lower leg. 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 Staging PET scan on 12/26/15 revealed 4 cutaneous and 1 subcutaneous hypermetabolic nodules in the distal right lower extremity with no other evidence of lymphoma.  The patient underwent two cycles of Bendamustine/Rituxan (01/03/2016 and 01/31/2016). Dr. Benay Spice discussed salvage chemotherapy with the patient. She has declined chemotherapy currently and presents today to discuss palliative radiation to her right  leg lesions.  Review of Systems: On review of systems, the patient reports that she is doing well overall. She denies any chest pain, shortness of breath, cough, fevers, chills, night sweats, unintended weight changes. She denies any bowel or bladder disturbances, and denies abdominal pain, nausea or vomiting. She only reports nausea during chemotherapy which she completed last month. She denies any new musculoskeletal or joint aches or pains. The lesions she has on her calf are not painful but the largest which is open does ooze yellow to pink serous fluid. A complete review of systems is obtained and is otherwise negative.  Past Medical History:  Past Medical History  Diagnosis Date  . Thyroid disease     Hyperthyroidism  . Arthritis     Osteoarthritis  . Hypertension   . S/P knee replacement     Left knee  . B-cell lymphoma ([place]) 05/04/11    Large B-cell lymphoma  . Cancer ([place])     NHL  . Hx of radiation therapy 08/17/13- 09/03/13    right lower extremity- lymphoma    Past Surgical History: Past Surgical History  Procedure Laterality Date  . Total knee arthroplasty Left     x 2  . Neck lesion biopsy Right 05/04/11    node biopsies    Social History:  Social History   Social History  . Marital Status: Married    Spouse Name: N/A  . Number of Children: N/A  . Years of Education: N/A   Occupational History  . Not on file.   Social History Main Topics  . Smoking status: Never Smoker   . Smokeless tobacco: Not on file  .  Alcohol Use: No  . Drug Use: No  . Sexual Activity: Not on file   Other Topics Concern  . Not on file   Social History Narrative    Family History: Family History  Problem Relation Age of Onset  . Cancer Mother     bladder  . Heart attack Father   . Cancer Brother     kidney     ALLERGIES:  is allergic to lisinopril.  Meds: Current Outpatient Prescriptions  Medication Sig Dispense Refill  . levothyroxine (SYNTHROID, LEVOTHROID) 125  MCG tablet TAKE 1 TABLET EVERY MORNING 90 tablet 0  . losartan-hydrochlorothiazide (HYZAAR) 50-12.5 MG tablet Take 1 tablet by mouth daily. 90 tablet 3  . metoprolol tartrate (LOPRESSOR) 25 MG tablet Take 25 mg by mouth 2 (two) times daily.    . pravastatin (PRAVACHOL) 20 MG tablet Take 1 tablet (20 mg total) by mouth at bedtime. 90 tablet 3  . doxycycline (VIBRA-TABS) 100 MG tablet Take 1 tablet (100 mg total) by mouth 2 (two) times daily. (Patient not taking: Reported on 02/23/2016) 14 tablet 0  . prochlorperazine (COMPAZINE) 10 MG tablet Take 1 tablet (10 mg total) by mouth every 6 (six) hours as needed for nausea or vomiting. (Patient not taking: Reported on 02/23/2016) 30 tablet 0   No current facility-administered medications for this encounter.    Physical Findings:  height is 5\' 2"  (1.575 m) and weight is 193 lb 1.6 oz (87.59 kg). Her oral temperature is 98.5 F (36.9 C). Her blood pressure is 108/72 and her pulse is 98. Her respiration is 16.   Pain scale 0/10 In general this is a well appearing Caucasian woman in no acute distress. She is alert and oriented x4 and appropriate throughout the examination. HEENT reveals that the patient is normocephalic, atraumatic. EOMs are intact. PERRLA.  Cardiovascular exam reveals a regular rate and rhythm, no clicks rubs or murmurs are auscultated. Chest is clear to auscultation bilaterally. Lymphatic assessment is performed and does not reveal any adenopathy in the cervical, supraclavicular, axillary, or inguinal chains. Abdomen has active bowel sounds in all quadrants and is intact. The abdomen is soft, non tender, non distended. There are 4 lesions noted in the right lower extremity. The first lesion is located in the posterior medial right calf with central ulceration; no bleeding or oozing actively. About 1 cm below this is a 1 cm cutaneous nodule that has not erupted. About 2 cm below that nodule is a 0.5 cm subcutaneous nodule and this is marked with  a circle in marker. In the right anterior lateral lower extremity, about 4 cm below the patella, is a lesion measuring about 2 cm with hyperpigmentation that is flat and nonulcerative. There is minimal pigment changes along the right calcaneous consistent with her previous treatment field. There is a possible lesion marked with an X with marker at the level of the anterior lesion but deep to the skin.Lower extremities reveal trace lower extremity edema, otherwise they are negative for deep calf tenderness, cyanosis or clubbing.   Lab Findings: Lab Results  Component Value Date   WBC 3.1* 02/23/2016   HGB 12.4 02/23/2016   HCT 36.4 02/23/2016   MCV 88.6 02/23/2016   PLT 167 02/23/2016     Radiographic Findings: No results found.  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   Plan: Dr. Lisbeth Renshaw reviews her PET scan findings and pathology findings. We spent time  detailing the lesions which are present. Although her PET report called 5 lesions, it looks like perhaps there were 6 to start out with and that clinically two of these areas have improved in terms of size. That being said, Dr. Lisbeth Renshaw discusses CT simulation to better outline the current size and position of these lesions, and to treat the anterior lesion which has improved since chemotherapy in addition to the visible three lesions, and possibly the other which appears to have improved since chemotherapy that is posterior, but at the same level as the anterior lesion. The Patient is in agreement, and has signed written consent to move forward on Wednesday. We discussed the risks, benefits, short and long-term effects of radiotherapy and she is interested in proceeding.  Given her family history of mother, brother, and maternal grandmother having kidney cancer, we discussed referring the patient for genetic counseling for the possibility of her having Lynch  syndrome. She is in agreement to meet with them.    [name], PAC  This document serves as a record of services personally performed by Shona Simpson, PA-C and Kyung Rudd, MD. It was created on their behalf by Darcus Austin, a trained medical scribe. The creation of this record is based on the scribe's personal observations and the providers' statements to them. This document has been checked and approved by the attending provider.

## 2016-03-05 NOTE — Addendum Note (Signed)
Encounter addended by: Yannis Gumbs, MD on: 03/05/2016  4:12 PM<BR>     Documentation filed: Problem List

## 2016-03-07 ENCOUNTER — Ambulatory Visit
Admission: RE | Admit: 2016-03-07 | Discharge: 2016-03-07 | Disposition: A | Discharge status: Home / Self Care | Payer: Medicare Other | Source: Ambulatory Visit | Attending: Radiation Oncology | Admitting: Radiation Oncology

## 2016-03-07 ENCOUNTER — Telehealth: Payer: Self-pay | Admitting: *Deleted

## 2016-03-07 DIAGNOSIS — Z510 Encounter for antineoplastic radiation therapy: Secondary | ICD-10-CM | POA: Diagnosis present

## 2016-03-07 DIAGNOSIS — C8335 Diffuse large B-cell lymphoma, lymph nodes of inguinal region and lower limb: Secondary | ICD-10-CM | POA: Diagnosis present

## 2016-03-07 NOTE — Telephone Encounter (Signed)
Called patient to inform of genetics appt. With Jeanine Luz on 04-10-16 @  1 pm , patient stated that she wants this appt. Cancelled for now, and she'll think about it and if interested she call us back and have [place] reschedule this.

## 2016-03-08 ENCOUNTER — Ambulatory Visit: Payer: Medicare Other | Admitting: Radiation Oncology

## 2016-03-09 ENCOUNTER — Other Ambulatory Visit: Payer: Self-pay | Admitting: Emergency Medicine

## 2016-03-09 ENCOUNTER — Ambulatory Visit
Admission: RE | Admit: 2016-03-09 | Discharge: 2016-03-09 | Disposition: A | Discharge status: Home / Self Care | Payer: Medicare Other | Source: Ambulatory Visit | Attending: Radiation Oncology | Admitting: Radiation Oncology

## 2016-03-09 DIAGNOSIS — Z510 Encounter for antineoplastic radiation therapy: Secondary | ICD-10-CM | POA: Diagnosis not present

## 2016-03-14 ENCOUNTER — Ambulatory Visit
Admission: RE | Admit: 2016-03-14 | Discharge: 2016-03-14 | Disposition: A | Discharge status: Home / Self Care | Payer: Medicare Other | Source: Ambulatory Visit | Attending: Radiation Oncology | Admitting: Radiation Oncology

## 2016-03-14 DIAGNOSIS — Z510 Encounter for antineoplastic radiation therapy: Secondary | ICD-10-CM | POA: Diagnosis not present

## 2016-03-15 ENCOUNTER — Ambulatory Visit
Admission: RE | Admit: 2016-03-15 | Discharge: 2016-03-15 | Disposition: A | Discharge status: Home / Self Care | Payer: Medicare Other | Source: Ambulatory Visit | Attending: Radiation Oncology | Admitting: Radiation Oncology

## 2016-03-15 DIAGNOSIS — Z510 Encounter for antineoplastic radiation therapy: Secondary | ICD-10-CM | POA: Diagnosis not present

## 2016-03-16 ENCOUNTER — Ambulatory Visit
Admission: RE | Admit: 2016-03-16 | Discharge: 2016-03-16 | Disposition: A | Discharge status: Home / Self Care | Payer: Medicare Other | Source: Ambulatory Visit | Attending: Radiation Oncology | Admitting: Radiation Oncology

## 2016-03-16 VITALS — BP 130/67 | HR 60 | Temp 97.7°F | Ht 62.0 in | Wt 193.2 lb

## 2016-03-16 DIAGNOSIS — Z510 Encounter for antineoplastic radiation therapy: Secondary | ICD-10-CM | POA: Diagnosis not present

## 2016-03-16 DIAGNOSIS — C8335 Diffuse large B-cell lymphoma, lymph nodes of inguinal region and lower limb: Secondary | ICD-10-CM

## 2016-03-16 NOTE — Progress Notes (Signed)
Kelly Ray has received 3 fractions to her right leg and left leg.  Note moistness of the left lateral calf area for which she reports tht at times it leeks.  Note redness in the area.  Denies any pain at this time.

## 2016-03-17 NOTE — Progress Notes (Signed)
  Radiation Oncology         [phone] ________________________________  Name: Kelly Ray MRN: [id number]  Date: 03/07/2016  DOB: 1940/12/10  SIMULATION AND TREATMENT PLANNING NOTE  DIAGNOSIS:     ICD-9-CM ICD-10-CM   1. Diffuse large B-cell lymphoma of lymph nodes of lower extremity (HCC) 202.85 C83.35      Site:  3 cutaneous/subcutaneous tumors within the right lower extremity  NARRATIVE:  The patient was brought to the [facility].  Identity was confirmed.  All relevant records and images related to the planned course of therapy were reviewed.   Written consent to proceed with treatment was confirmed which was freely given after reviewing the details related to the planned course of therapy had been reviewed with the patient.  Then, the patient was set-up in a stable reproducible  supine and prone position for radiation therapy.  CT images were obtained.  Surface markings were placed.    Medically necessary complex treatment device(s) for immobilization:  Customized vac lock bag, customized Accuform device.   The CT images were loaded into the planning software.  Then the target and avoidance structures were contoured.  Treatment planning then occurred.  The radiation prescription was entered and confirmed.  A total of 3 complex treatment devices were fabricated which relate to the designed radiation treatment fields. Each of these customized fields/ complex treatment devices will be used on a daily basis during the radiation course. I have requested : Special port plan for each of the 3 target regions.   PLAN:  The patient will receive 36 Gy in 12 fractions.  ________________________________   Jodelle Gross, MD, PhD

## 2016-03-17 NOTE — Progress Notes (Signed)
   Department of Radiation Oncology  Phone:  [phone] Fax:        [phone]  Weekly Treatment Note    Name: Kelly Ray Date: 03/17/2016 MRN: [id number] DOB: 1940/09/03   Diagnosis:     ICD-9-CM ICD-10-CM   1. Diffuse large B-cell lymphoma of lymph nodes of lower extremity (HCC) 202.85 C83.35      Current dose: 9 Gy  Current fraction: 3   MEDICATIONS: Current Outpatient Prescriptions  Medication Sig Dispense Refill  . levothyroxine (SYNTHROID, LEVOTHROID) 125 MCG tablet TAKE 1 TABLET EVERY MORNING 90 tablet 0  . losartan-hydrochlorothiazide (HYZAAR) 50-12.5 MG tablet Take 1 tablet by mouth daily. 90 tablet 3  . metoprolol tartrate (LOPRESSOR) 25 MG tablet Take 25 mg by mouth 2 (two) times daily.    . pravastatin (PRAVACHOL) 20 MG tablet Take 1 tablet (20 mg total) by mouth at bedtime. 90 tablet 3  . doxycycline (VIBRA-TABS) 100 MG tablet Take 1 tablet (100 mg total) by mouth 2 (two) times daily. (Patient not taking: Reported on 02/23/2016) 14 tablet 0  . prochlorperazine (COMPAZINE) 10 MG tablet Take 1 tablet (10 mg total) by mouth every 6 (six) hours as needed for nausea or vomiting. (Patient not taking: Reported on 03/16/2016) 30 tablet 0   No current facility-administered medications for this encounter.     ALLERGIES: Lisinopril   LABORATORY DATA:  Lab Results  Component Value Date   WBC 3.1* 02/23/2016   HGB 12.4 02/23/2016   HCT 36.4 02/23/2016   MCV 88.6 02/23/2016   PLT 167 02/23/2016   Lab Results  Component Value Date   NA 140 02/23/2016   K 3.8 02/23/2016   CL 99 06/07/2015   CO2 27 02/23/2016   Lab Results  Component Value Date   ALT 17 02/23/2016   AST 24 02/23/2016   ALKPHOS 84 02/23/2016   BILITOT 0.59 02/23/2016     NARRATIVE: Kelly Ray was seen today for weekly treatment management. The chart was checked and the patient's films were reviewed.  Kelly Ray has received 3 fractions to her right leg and left leg.  Note  moistness of the left lateral calf area for which she reports tht at times it leeks.  Note redness in the area.  Denies any pain at this time.    PHYSICAL EXAMINATION: height is 5\' 2"  (1.575 m) and weight is 193 lb 3.2 oz (87.635 kg). Her temperature is 97.7 F (36.5 C). Her blood pressure is 130/67 and her pulse is 60.      No significant radiation treatment effect involving the skin in the target area currently.  ASSESSMENT: The patient is doing satisfactorily with treatment.  PLAN: We will continue with the patient's radiation treatment as planned.

## 2016-03-19 ENCOUNTER — Ambulatory Visit
Admission: RE | Admit: 2016-03-19 | Discharge: 2016-03-19 | Disposition: A | Discharge status: Home / Self Care | Payer: Medicare Other | Source: Ambulatory Visit | Attending: Radiation Oncology | Admitting: Radiation Oncology

## 2016-03-19 DIAGNOSIS — Z510 Encounter for antineoplastic radiation therapy: Secondary | ICD-10-CM | POA: Diagnosis not present

## 2016-03-20 ENCOUNTER — Ambulatory Visit
Admission: RE | Admit: 2016-03-20 | Discharge: 2016-03-20 | Disposition: A | Discharge status: Home / Self Care | Payer: Medicare Other | Source: Ambulatory Visit | Attending: Radiation Oncology | Admitting: Radiation Oncology

## 2016-03-20 DIAGNOSIS — Z510 Encounter for antineoplastic radiation therapy: Secondary | ICD-10-CM | POA: Diagnosis not present

## 2016-03-21 ENCOUNTER — Ambulatory Visit
Admission: RE | Admit: 2016-03-21 | Discharge: 2016-03-21 | Disposition: A | Discharge status: Home / Self Care | Payer: Medicare Other | Source: Ambulatory Visit | Attending: Radiation Oncology | Admitting: Radiation Oncology

## 2016-03-21 DIAGNOSIS — Z510 Encounter for antineoplastic radiation therapy: Secondary | ICD-10-CM | POA: Diagnosis not present

## 2016-03-22 ENCOUNTER — Ambulatory Visit
Admission: RE | Admit: 2016-03-22 | Discharge: 2016-03-22 | Disposition: A | Discharge status: Home / Self Care | Payer: Medicare Other | Source: Ambulatory Visit | Attending: Radiation Oncology | Admitting: Radiation Oncology

## 2016-03-22 DIAGNOSIS — Z510 Encounter for antineoplastic radiation therapy: Secondary | ICD-10-CM | POA: Diagnosis not present

## 2016-03-23 ENCOUNTER — Ambulatory Visit
Admission: RE | Admit: 2016-03-23 | Discharge: 2016-03-23 | Disposition: A | Discharge status: Home / Self Care | Payer: Medicare Other | Source: Ambulatory Visit | Attending: Radiation Oncology | Admitting: Radiation Oncology

## 2016-03-23 ENCOUNTER — Telehealth: Payer: Self-pay | Admitting: *Deleted

## 2016-03-23 ENCOUNTER — Ambulatory Visit: Payer: Medicare Other | Admitting: Nurse Practitioner

## 2016-03-23 ENCOUNTER — Other Ambulatory Visit: Payer: Self-pay

## 2016-03-23 DIAGNOSIS — Z510 Encounter for antineoplastic radiation therapy: Secondary | ICD-10-CM | POA: Diagnosis not present

## 2016-03-23 DIAGNOSIS — C8335 Diffuse large B-cell lymphoma, lymph nodes of inguinal region and lower limb: Secondary | ICD-10-CM

## 2016-03-23 NOTE — Telephone Encounter (Signed)
Reschedule for 2-3 weeks after radiation

## 2016-03-23 NOTE — Telephone Encounter (Signed)
Patient called and cancelled appts for today. Patient stated she has another MD appt. Also stated that this appt was to be cancelled until after radiation

## 2016-03-23 NOTE — Progress Notes (Signed)
   Department of Radiation Oncology  Phone:  [phone] Fax:        [phone]  Weekly Treatment Note    Name: Kelly Ray Date: 03/23/2016 MRN: [id number] DOB: Aug 10, 1941   Diagnosis:     ICD-9-CM ICD-10-CM   1. Diffuse large B-cell lymphoma of lymph nodes of lower extremity (HCC) 202.85 C83.35      Current dose: 9 Gy  Current fraction: 3   MEDICATIONS: Current Outpatient Prescriptions  Medication Sig Dispense Refill  . doxycycline (VIBRA-TABS) 100 MG tablet Take 1 tablet (100 mg total) by mouth 2 (two) times daily. (Patient not taking: Reported on 02/23/2016) 14 tablet 0  . levothyroxine (SYNTHROID, LEVOTHROID) 125 MCG tablet TAKE 1 TABLET EVERY MORNING 90 tablet 0  . losartan-hydrochlorothiazide (HYZAAR) 50-12.5 MG tablet Take 1 tablet by mouth daily. 90 tablet 3  . metoprolol tartrate (LOPRESSOR) 25 MG tablet Take 25 mg by mouth 2 (two) times daily.    . pravastatin (PRAVACHOL) 20 MG tablet Take 1 tablet (20 mg total) by mouth at bedtime. 90 tablet 3  . prochlorperazine (COMPAZINE) 10 MG tablet Take 1 tablet (10 mg total) by mouth every 6 (six) hours as needed for nausea or vomiting. (Patient not taking: Reported on 03/16/2016) 30 tablet 0   No current facility-administered medications for this encounter.      ALLERGIES: Lisinopril   LABORATORY DATA:  Lab Results  Component Value Date   WBC 3.1 (L) 02/23/2016   HGB 12.4 02/23/2016   HCT 36.4 02/23/2016   MCV 88.6 02/23/2016   PLT 167 02/23/2016   Lab Results  Component Value Date   NA 140 02/23/2016   K 3.8 02/23/2016   CL 99 06/07/2015   CO2 27 02/23/2016   Lab Results  Component Value Date   ALT 17 02/23/2016   AST 24 02/23/2016   ALKPHOS 84 02/23/2016   BILITOT 0.59 02/23/2016     NARRATIVE: Kelly Ray was seen today for weekly treatment management. The chart was checked and the patient's films were reviewed.  Kelly Ray has received 3 fractions to her right leg and left leg.  Note  moistness of the left lateral calf area for which she reports tht at times it leeks.  Note redness in the area.  Denies any pain at this time.    PHYSICAL EXAMINATION: vitals were not taken for this visit.     No significant radiation treatment effect involving the skin in the target area currently.  ASSESSMENT: The patient is doing satisfactorily with treatment.  PLAN: We will continue with the patient's radiation treatment as planned.        This document serves as a record of services personally performed by Tyler Pita, MD. It was created on his behalf by Truddie Hidden, a trained medical scribe. The creation of this record is based on the scribe's personal observations and the provider's statements to them. This document has been checked and approved by the attending provider.

## 2016-03-26 ENCOUNTER — Ambulatory Visit: Admission: RE | Admit: 2016-03-26 | Payer: Medicare Other | Source: Ambulatory Visit | Admitting: Radiation Oncology

## 2016-03-26 ENCOUNTER — Encounter: Payer: Medicare Other | Admitting: Radiation Oncology

## 2016-03-26 ENCOUNTER — Ambulatory Visit
Admission: RE | Admit: 2016-03-26 | Discharge: 2016-03-26 | Disposition: A | Discharge status: Home / Self Care | Payer: Medicare Other | Source: Ambulatory Visit | Attending: Radiation Oncology | Admitting: Radiation Oncology

## 2016-03-26 DIAGNOSIS — Z510 Encounter for antineoplastic radiation therapy: Secondary | ICD-10-CM | POA: Diagnosis not present

## 2016-03-26 DIAGNOSIS — C8335 Diffuse large B-cell lymphoma, lymph nodes of inguinal region and lower limb: Secondary | ICD-10-CM | POA: Insufficient documentation

## 2016-03-26 NOTE — Progress Notes (Signed)
   Department of Radiation Oncology  Phone:  [phone] Fax:        [phone]  Weekly Treatment Note    Name: Kelly Ray Date: 03/26/2016 MRN: [id number] DOB: 07/08/1941   Diagnosis:     ICD-9-CM ICD-10-CM   1. 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 levothyroxine (SYNTHROID, LEVOTHROID) 125 MCG tablet TAKE 1 TABLET EVERY MORNING 90 tablet 0  . losartan-hydrochlorothiazide (HYZAAR) 50-12.5 MG tablet Take 1 tablet by mouth daily. 90 tablet 3  . metoprolol tartrate (LOPRESSOR) 25 MG tablet Take 25 mg by mouth 2 (two) times daily.    . pravastatin (PRAVACHOL) 20 MG tablet Take 1 tablet (20 mg total) by mouth at bedtime. 90 tablet 3   No current facility-administered medications for this encounter.      ALLERGIES: Lisinopril   LABORATORY DATA:  Lab Results  Component Value Date   WBC 3.1 (L) 02/23/2016   HGB 12.4 02/23/2016   HCT 36.4 02/23/2016   MCV 88.6 02/23/2016   PLT 167 02/23/2016   Lab Results  Component Value Date   NA 140 02/23/2016   K 3.8 02/23/2016   CL 99 06/07/2015   CO2 27 02/23/2016   Lab Results  Component Value Date   ALT 17 02/23/2016   AST 24 02/23/2016   ALKPHOS 84 02/23/2016   BILITOT 0.59 02/23/2016     NARRATIVE: Kelly Ray was seen today for weekly treatment management. The chart was checked and the patient's films were reviewed.  No voiced concerns today.  No longer has nay drainage on the medial lesion of her right calf.  All other areas intact. Denies any pain but notes mild fatigue   PHYSICAL EXAMINATION: vitals were not taken for this visit.     No significant radiation treatment effect involving the skin in the target area currently.  ASSESSMENT: The patient is doing satisfactorily with treatment.  PLAN: We will continue with the  patient's radiation treatment as planned. She finishes treatment on Thursday.     ------------------------------------------------  Jodelle Gross, MD, PhD    This document serves as a record of services personally performed by Kyung Rudd, MD. It was created on his behalf by Lendon Collar, a trained medical scribe. The creation of this record is based on the scribe's personal observations and the provider's statements to them. This document has been checked and approved by the attending provider.

## 2016-03-26 NOTE — Progress Notes (Signed)
No voiced concerns today.  No longer has nay drainage on the medial lesion of her right calf.  All other areas intact.  Denies any pain but notes mild fatigue

## 2016-03-27 ENCOUNTER — Ambulatory Visit
Admission: RE | Admit: 2016-03-27 | Discharge: 2016-03-27 | Disposition: A | Discharge status: Home / Self Care | Payer: Medicare Other | Source: Ambulatory Visit | Attending: Radiation Oncology | Admitting: Radiation Oncology

## 2016-03-27 DIAGNOSIS — C8335 Diffuse large B-cell lymphoma, lymph nodes of inguinal region and lower limb: Secondary | ICD-10-CM | POA: Diagnosis present

## 2016-03-27 DIAGNOSIS — Z510 Encounter for antineoplastic radiation therapy: Secondary | ICD-10-CM | POA: Diagnosis not present

## 2016-03-28 ENCOUNTER — Ambulatory Visit
Admission: RE | Admit: 2016-03-28 | Discharge: 2016-03-28 | Disposition: A | Discharge status: Home / Self Care | Payer: Medicare Other | Source: Ambulatory Visit | Attending: Radiation Oncology | Admitting: Radiation Oncology

## 2016-03-28 DIAGNOSIS — Z510 Encounter for antineoplastic radiation therapy: Secondary | ICD-10-CM | POA: Diagnosis not present

## 2016-03-29 ENCOUNTER — Encounter: Payer: Self-pay | Admitting: Radiation Oncology

## 2016-03-29 ENCOUNTER — Ambulatory Visit
Admission: RE | Admit: 2016-03-29 | Discharge: 2016-03-29 | Disposition: A | Discharge status: Home / Self Care | Payer: Medicare Other | Source: Ambulatory Visit | Attending: Radiation Oncology | Admitting: Radiation Oncology

## 2016-03-29 DIAGNOSIS — Z510 Encounter for antineoplastic radiation therapy: Secondary | ICD-10-CM | POA: Diagnosis not present

## 2016-04-02 ENCOUNTER — Ambulatory Visit
Admission: RE | Admit: 2016-04-02 | Discharge: 2016-04-02 | Disposition: A | Discharge status: Home / Self Care | Payer: Medicare Other | Source: Ambulatory Visit | Attending: Radiation Oncology | Admitting: Radiation Oncology

## 2016-04-02 ENCOUNTER — Telehealth: Payer: Self-pay | Admitting: *Deleted

## 2016-04-02 ENCOUNTER — Encounter: Payer: Self-pay | Admitting: Radiation Oncology

## 2016-04-02 DIAGNOSIS — L02419 Cutaneous abscess of limb, unspecified: Secondary | ICD-10-CM

## 2016-04-02 DIAGNOSIS — C8335 Diffuse large B-cell lymphoma, lymph nodes of inguinal region and lower limb: Secondary | ICD-10-CM | POA: Diagnosis present

## 2016-04-02 DIAGNOSIS — L03119 Cellulitis of unspecified part of limb: Secondary | ICD-10-CM

## 2016-04-02 MED ORDER — AMOXICILLIN-POT CLAVULANATE 875-125 MG PO TABS: 1.0000 | ORAL_TABLET | Freq: Two times a day (BID) | ORAL | 0 refills | Status: DC

## 2016-04-02 NOTE — Telephone Encounter (Signed)
Called patient , per Dr. Lisbeth Renshaw , have patient come in to see Shona Simpson,.PA to check wound,  She will come in at 3pm  12:02 PM

## 2016-04-02 NOTE — Progress Notes (Signed)
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  Interval Since Last Radiation:  4 days. Completed radiation 03/29/2016 to the right lower leg.  Narrative:  The patient returns today for work in follow-up.  She is here today with drainage from her right posterior leg. She states she was able to express yellowish pus from this area. She reports tenderness to the touch with level 5 pain. She has no increased swelling from this area since last check on 03/26/2016.  Of note on 7/28 she had similar findings of the same lesion and this was cultured and found to be consistent with beta hemolytic streptococcus. She was given a course of doxycycline prior to culture results and her symptoms improved temporarily.      On review of systems, the patient states she had drainage from her right posterior leg. She mentions the drainage looked like a blister. She mentions she has some fatigue but no malaise. She denies a fever. No other complaints are noted.   ALLERGIES:  is allergic to lisinopril.  Meds: Current Outpatient Prescriptions  Medication Sig Dispense Refill  . levothyroxine (SYNTHROID, LEVOTHROID) 125 MCG tablet TAKE 1 TABLET EVERY MORNING 90 tablet 0  . losartan-hydrochlorothiazide (HYZAAR) 50-12.5 MG tablet Take 1 tablet by mouth daily. 90 tablet 3  . metoprolol tartrate (LOPRESSOR) 25 MG tablet Take 25 mg by mouth 2 (two) times daily.    . pravastatin (PRAVACHOL) 20 MG tablet Take 1 tablet (20 mg total) by mouth at bedtime. 90 tablet 3  . amoxicillin-clavulanate (AUGMENTIN) 875-125 MG tablet Take 1 tablet by mouth 2 (two) times daily. 20 tablet 0   No current facility-administered medications for this encounter.     Physical Findings:  height is  5\' 2"  (1.575 m) and weight is 188 lb 12.8 oz (85.6 kg). Her temperature is 97.8 F (36.6 C). Her blood pressure is 110/63 and her pulse is 70.  In general this is a well appearing caucasian female in no acute distress. She's alert and oriented x4 and appropriate throughout the examination. Cardiopulmonary assessment is negative for acute distress and she exhibits normal effort. There is no purulent matter or carbuncle but a shallow central area of ulceration and raised border with slight cellulitic change of the right lower extremity.   Lab Findings: Lab Results  Component Value Date   WBC 3.1 (L) 02/23/2016   HGB 12.4 02/23/2016   HCT 36.4 02/23/2016   MCV 88.6 02/23/2016   PLT 167 02/23/2016     Radiographic Findings: No results found.  Impression/Plan: 1. [name] is a 75 yo woman with diffuse large B-cell lymphoma of lymph nodes of lower extremity with infectious appearing findings of her posterior lesion. I prescribed  Augmentin 875 mg to take for 10 days twice daily. I mentioned that she needs to let me know if she starts having diarrhea when she starts the antibiotic. She has a follow up appointment scheduled with me 05/25/2016.      [name], PAC    This document serves as a record of services personally performed by Shona Simpson, PAC. It was created on her behalf by Lendon Collar, a trained medical scribe. The creation of this record is based on the  scribe's personal observations and the provider's statements to them. This document has been checked and approved by the attending provider.

## 2016-04-02 NOTE — Telephone Encounter (Signed)
Returned call to patient stating she has on her right calf now open wound is having pus/white infection all around calf,  Was on Doxycycline post 3weeks ago for this, saw Dr. Lisbeth Renshaw Monday and he said to keep an eye on it and call for any changes, should she come in or get another antibiotic called in to her pharmacy?", no fever stated, will call patient back after speaking with MD and Worthy Flank. PA 9:48 AM

## 2016-04-02 NOTE — Progress Notes (Addendum)
Ms. Harnois here today with drainage from her right posterior leg.  She states she was able to express yellowish pus from this area.  She reports tenderness to the touch with level 5 pain.  No increased swelling from this area since last check on 03/26/16.  BP 110/63 (BP Location: Right Arm, Patient Position: Sitting, Cuff Size: Normal)   Pulse 70   Temp 97.8 F (36.6 C)   Ht 5\' 2"  (1.575 m)   Wt 188 lb 12.8 oz (85.6 kg)   BMI 34.53 kg/m    Wt Readings from Last 3 Encounters:  04/02/16 188 lb 12.8 oz (85.6 kg)  03/16/16 193 lb 3.2 oz (87.6 kg)  03/05/16 193 lb 1.6 oz (87.6 kg)

## 2016-04-04 ENCOUNTER — Ambulatory Visit: Payer: Medicare Other | Admitting: Nurse Practitioner

## 2016-04-04 ENCOUNTER — Other Ambulatory Visit: Payer: Medicare Other

## 2016-04-04 ENCOUNTER — Telehealth: Payer: Self-pay | Admitting: Nurse Practitioner

## 2016-04-04 NOTE — Telephone Encounter (Signed)
Kelly Ray was not aware she had an appointment today. She will not be coming. We will reschedule to later this week or early next week.

## 2016-04-05 DIAGNOSIS — L03119 Cellulitis of unspecified part of limb: Secondary | ICD-10-CM

## 2016-04-05 DIAGNOSIS — L02419 Cutaneous abscess of limb, unspecified: Secondary | ICD-10-CM | POA: Insufficient documentation

## 2016-04-09 NOTE — Progress Notes (Signed)
  Radiation Oncology         [phone] ________________________________  Name: Kelly Ray MRN: [id number]  Date: 03/29/2016  DOB: March 26, 1941  End of Treatment Note  Diagnosis:  Diffuse large B-cell lymphoma with right lower extremity nodules  Indication for treatment: Palliative     Radiation treatment dates: 03/15/16 - 03/29/16  Site/dose: 1) Right leg inferior posterior lesion: 36 Gy in 12 fractions.   2) Right leg superior posterior lesion: 36 Gy in 12 fractions.   3) Right leg superior anterior lesion: 36 Gy in 12 fractions.  Beams/energy: 1) En Face // 9 MeV Electron   2) En Face // 9 MeV Electron   3) En Face // 6 MeV Electron  Narrative: The patient tolerated radiation treatment relatively well. No significant radiation treatment effect involving the skin in the target area was noted.  Plan: The patient has completed radiation treatment. The patient will return to radiation oncology clinic for routine followup in one month. I advised them to call or return sooner if they have any questions or concerns related to their recovery or treatment.  ------------------------------------------------  Jodelle Gross, MD, PhD  This document serves as a record of services personally performed by Kyung Rudd, MD. It was created on his behalf by Darcus Austin, a trained medical scribe. The creation of this record is based on the scribe's personal observations and the provider's statements to them. This document has been checked and approved by the attending provider.

## 2016-04-10 ENCOUNTER — Telehealth: Payer: Self-pay | Admitting: *Deleted

## 2016-04-10 ENCOUNTER — Other Ambulatory Visit: Payer: Medicare Other

## 2016-04-10 ENCOUNTER — Encounter: Payer: Medicare Other | Admitting: Genetic Counselor

## 2016-04-10 NOTE — Telephone Encounter (Signed)
Per LOS I have scheduled appts. APP already had patient scheduled at 10:45am, ok to move to 2:45 pm pr APP. I have scheduled appts. Called and left patient a message to call the office. Calendar mailed

## 2016-04-12 ENCOUNTER — Other Ambulatory Visit: Payer: Medicare Other

## 2016-04-12 ENCOUNTER — Telehealth: Payer: Self-pay

## 2016-04-12 ENCOUNTER — Ambulatory Visit: Payer: Medicare Other | Admitting: Nurse Practitioner

## 2016-04-12 NOTE — Telephone Encounter (Signed)
Called to follow up with patient concerning misses appts. Left message for pt to call back

## 2016-04-12 NOTE — Telephone Encounter (Signed)
Called patient to follow up with no-show. Patient did not know her appointment was today. Informed Ned Card NP. Lattie Haw will discuss appointment availability with MD Benay Spice and schedule the patient accordingly. Patient verbalized understanding.

## 2016-05-01 ENCOUNTER — Ambulatory Visit
Admission: RE | Admit: 2016-05-01 | Discharge: 2016-05-01 | Disposition: A | Discharge status: Home / Self Care | Payer: Medicare Other | Source: Ambulatory Visit | Attending: Radiation Oncology | Admitting: Radiation Oncology

## 2016-05-01 ENCOUNTER — Encounter: Payer: Self-pay | Admitting: Radiation Oncology

## 2016-05-01 VITALS — BP 125/69 | HR 56 | Temp 98.0°F | Resp 18 | Ht 62.0 in | Wt 189.9 lb

## 2016-05-01 DIAGNOSIS — Z888 Allergy status to other drugs, medicaments and biological substances status: Secondary | ICD-10-CM | POA: Diagnosis not present

## 2016-05-01 DIAGNOSIS — L0390 Cellulitis, unspecified: Secondary | ICD-10-CM | POA: Diagnosis not present

## 2016-05-01 DIAGNOSIS — L03119 Cellulitis of unspecified part of limb: Secondary | ICD-10-CM

## 2016-05-01 DIAGNOSIS — C8260 Cutaneous follicle center lymphoma, unspecified site: Secondary | ICD-10-CM | POA: Insufficient documentation

## 2016-05-01 DIAGNOSIS — Z923 Personal history of irradiation: Secondary | ICD-10-CM | POA: Insufficient documentation

## 2016-05-01 DIAGNOSIS — L02419 Cutaneous abscess of limb, unspecified: Secondary | ICD-10-CM

## 2016-05-01 DIAGNOSIS — Z79899 Other long term (current) drug therapy: Secondary | ICD-10-CM | POA: Insufficient documentation

## 2016-05-01 DIAGNOSIS — C8335 Diffuse large B-cell lymphoma, lymph nodes of inguinal region and lower limb: Secondary | ICD-10-CM

## 2016-05-01 NOTE — Addendum Note (Signed)
Encounter addended by: Benn Moulder, RN on: 05/01/2016 11:53 AM<BR>    Actions taken: Charge Capture section accepted

## 2016-05-01 NOTE — Progress Notes (Addendum)
Radiation Oncology         [phone] ________________________________  Name: Laquasia Lydick MRN: [id number]  Date: 05/01/2016  DOB: 02-Mar-1941  Post Treatment Note  CC: [name], MD  Darlyne Russian, MD  Diagnosis:   Cutaneous B-cell lymphoma  Interval Since Last Radiation:  4 weeks   03/14/16-03/29/16: The patient received 36 Gy in 12 fractions to the right anterior superior lesion, right inferior posterior lesion, and right superior posterior lesion.  08/17/2013 through 09/03/2013: Posterior right lower leg, 36 Gy in 12 fractions. 9 MEV electrons, en face with 0.8 cm bolus to maximize the dose to the skin surface.  Narrative:  The patient returns today for routine follow-up.  The patient tolerated her radiotherapy well however she did experience an episode of cellulitis. This was initally treated with doxycycline, however antibiotic therapy was changed to Augmentin due to her culture growing beta hemolytic streptococcus, sensitive to beta-lactam drugs.                              On review of systems, the patient states she is doing extremely well. She denies any fevers or chills, drainage from her right lower extremity sites, or redness. She states that there is been a scab in place over the previously treated cellulitic location which has improved andshe has noticed 3 separate lesions on her left anterior lower extremity which she is concerned for skin cancer being present. She plans to call her dermatologist today for follow-up. No other complaints or verbalized  ALLERGIES:  is allergic to lisinopril.  Meds: Current Outpatient Prescriptions  Medication Sig Dispense Refill  . levothyroxine (SYNTHROID, LEVOTHROID) 125 MCG tablet TAKE 1 TABLET EVERY MORNING 90 tablet 0  . losartan-hydrochlorothiazide (HYZAAR) 50-12.5 MG tablet Take 1 tablet by mouth daily. 90 tablet 3  . metoprolol tartrate (LOPRESSOR) 25 MG tablet Take 25 mg by mouth 2 (two) times daily.    . pravastatin  (PRAVACHOL) 20 MG tablet Take 1 tablet (20 mg total) by mouth at bedtime. 90 tablet 3   No current facility-administered medications for this encounter.     Physical Findings:  height is 5\' 2"  (1.575 m) and weight is 189 lb 14.4 oz (86.1 kg). Her oral temperature is 98 F (36.7 C). Her blood pressure is 125/69 and her pulse is 56 (abnormal). Her respiration is 18 and oxygen saturation is 99%.  In general this is a well appearing Caucasian female in no acute distress. She's alert and oriented x4 and appropriate throughout the examination. Cardiopulmonary assessment is negative for acute distress and she exhibits normal effort. Her right lower extremity is evaluated and reveals resolution of the previously palpable nodule in the upper posterior region, there is induration with approximately 2-3 mm of thickening but less circumscribed than previous along the posterior inferior lesion, the anterior medial lesion has an Ash are in place without evidence of cellulitic change. There is minimal erythema and scarringstent with previously treated infectious change. Hemosiderin deposition is noted anterior on the previously treated lesion is well several years ago.  Lab Findings: Lab Results  Component Value Date   WBC 3.1 (L) 02/23/2016   HGB 12.4 02/23/2016   HCT 36.4 02/23/2016   MCV 88.6 02/23/2016   PLT 167 02/23/2016     Radiographic Findings: No results found.  Impression/Plan: 1. 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 She will keep [place] informed of any changes or concerns at this site. 2. Recurrent Cellulitis secondary to #1. This has resolved and has not required additional antibiotic therapy.  3. 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 The patient is going to  make all of appointment with her dermatologist and have these biopsied. There are 3 separate lesions, and we did discuss that radiation may be indicated in the future if surgery is not able to be performed if these do indeed reveal malignancy. She states agreement and understanding.     [name], PAC

## 2016-05-01 NOTE — Progress Notes (Signed)
Kelly Ray is  here for a follow up appointment for large B cell lymphoma.  Weight changes, if any: Wt Readings from Last 3 Encounters:  05/01/16 189 lb 14.4 oz (86.1 kg)  04/02/16 188 lb 12.8 oz (85.6 kg)  03/16/16 193 lb 3.2 oz (87.6 kg)   Bowel/Bladder complaints, if [id number] Nausea / Vomiting, if any: None Skin:None Pain issues:None Appetite:Good Energy Level:Having fatigue all during the day. Current Complaints/ other details: BP 125/69 (BP Location: Right Arm, Patient Position: Sitting, Cuff Size: Normal)   Pulse (!) 56   Temp 98 F (36.7 C) (Oral)   Resp 18   Ht 5\' 2"  (1.575 m)   Wt 189 lb 14.4 oz (86.1 kg)   SpO2 99%   BMI 34.73 kg/m

## 2016-05-04 ENCOUNTER — Telehealth: Payer: Self-pay | Admitting: *Deleted

## 2016-05-04 ENCOUNTER — Telehealth: Payer: Self-pay | Admitting: Nurse Practitioner

## 2016-05-04 NOTE — Telephone Encounter (Signed)
Dr. Benay Spice notified and appt time given.  Message sent to schedulers to contact patient about next appt.

## 2016-05-04 NOTE — Telephone Encounter (Signed)
Spoke with pt to confirm appt per LOS

## 2016-05-04 NOTE — Telephone Encounter (Signed)
TC from patient inquiring about future appts with Dr. Benay Spice.  She has none scheduled and she is asking if she needs to have one to f/u with Dr. Benay Spice as her chemo and XRT are completed. Pt is requesting a call back.

## 2016-05-07 ENCOUNTER — Ambulatory Visit (INDEPENDENT_AMBULATORY_CARE_PROVIDER_SITE_OTHER): Payer: Medicare Other | Admitting: Emergency Medicine

## 2016-05-07 VITALS — BP 120/78 | HR 59 | Temp 98.1°F | Resp 18 | Ht 62.0 in | Wt 189.4 lb

## 2016-05-07 DIAGNOSIS — R21 Rash and other nonspecific skin eruption: Secondary | ICD-10-CM | POA: Diagnosis not present

## 2016-05-07 DIAGNOSIS — I10 Essential (primary) hypertension: Secondary | ICD-10-CM | POA: Diagnosis not present

## 2016-05-07 DIAGNOSIS — C8335 Diffuse large B-cell lymphoma, lymph nodes of inguinal region and lower limb: Secondary | ICD-10-CM | POA: Diagnosis not present

## 2016-05-07 DIAGNOSIS — E785 Hyperlipidemia, unspecified: Secondary | ICD-10-CM | POA: Diagnosis not present

## 2016-05-07 DIAGNOSIS — E039 Hypothyroidism, unspecified: Secondary | ICD-10-CM

## 2016-05-07 DIAGNOSIS — Z23 Encounter for immunization: Secondary | ICD-10-CM | POA: Diagnosis not present

## 2016-05-07 LAB — POCT CBC
Granulocyte percent: 53.6 %G (ref 37–80)
HEMATOCRIT: 40.8 % (ref 37.7–47.9)
Hemoglobin: 14.1 g/dL (ref 12.2–16.2)
LYMPH, POC: 1.8 (ref 0.6–3.4)
MCH, POC: 30.9 pg (ref 27–31.2)
MCHC: 34.7 g/dL (ref 31.8–35.4)
MCV: 89.1 fL (ref 80–97)
MID (cbc): 0.6 (ref 0–0.9)
MPV: 8.3 fL (ref 0–99.8)
PLATELET COUNT, POC: 179 10*3/uL (ref 142–424)
POC Granulocyte: 2.7 (ref 2–6.9)
POC LYMPH %: 35.3 % (ref 10–50)
POC MID %: 11.1 %M (ref 0–12)
RBC: 4.58 M/uL (ref 4.04–5.48)
RDW, POC: 12.7 %
WBC: 5.1 10*3/uL (ref 4.6–10.2)

## 2016-05-07 LAB — COMPLETE METABOLIC PANEL WITH GFR
ALBUMIN: 4.2 g/dL (ref 3.6–5.1)
ALK PHOS: 82 U/L (ref 33–130)
ALT: 14 U/L (ref 6–29)
AST: 18 U/L (ref 10–35)
BILIRUBIN TOTAL: 0.8 mg/dL (ref 0.2–1.2)
BUN: 13 mg/dL (ref 7–25)
CO2: 29 mmol/L (ref 20–31)
Calcium: 9.5 mg/dL (ref 8.6–10.4)
Chloride: 102 mmol/L (ref 98–110)
Creat: 0.67 mg/dL (ref 0.60–0.93)
GFR, Est African American: >89 mL/min (ref >=60)
GFR, Est Non African American: 87 mL/min (ref >=60)
GLUCOSE: 98 mg/dL (ref 65–99)
Potassium: 5.1 mmol/L (ref 3.5–5.3)
SODIUM: 139 mmol/L (ref 135–146)
TOTAL PROTEIN: 6.5 g/dL (ref 6.1–8.1)

## 2016-05-07 LAB — LIPID PANEL
CHOL/HDL RATIO: 3 ratio (ref <=5.0)
Cholesterol: 239 mg/dL — ABNORMAL HIGH (ref 125–200)
HDL: 80 mg/dL (ref >=46)
LDL Cholesterol: 132 mg/dL — ABNORMAL HIGH (ref <130)
Triglycerides: 133 mg/dL (ref <150)
VLDL: 27 mg/dL (ref <30)

## 2016-05-07 LAB — POCT SKIN KOH: Skin KOH, POC: POSITIVE

## 2016-05-07 LAB — TSH: TSH: 0.25 m[IU]/L — AB

## 2016-05-07 MED ORDER — METOPROLOL TARTRATE 25 MG PO TABS: 25.0000 mg | ORAL_TABLET | Freq: Two times a day (BID) | ORAL | 3 refills | Status: DC

## 2016-05-07 MED ORDER — LOSARTAN POTASSIUM-HCTZ 50-12.5 MG PO TABS: 1.0000 | ORAL_TABLET | Freq: Every day | ORAL | 3 refills | Status: DC

## 2016-05-07 MED ORDER — LEVOTHYROXINE SODIUM 125 MCG PO TABS: 125.0000 ug | ORAL_TABLET | Freq: Every morning | ORAL | 3 refills | Status: DC

## 2016-05-07 MED ORDER — PRAVASTATIN SODIUM 20 MG PO TABS: 20.0000 mg | ORAL_TABLET | Freq: Every day | ORAL | 3 refills | Status: DC

## 2016-05-07 NOTE — Progress Notes (Addendum)
Subjective:  This chart was scribed for Arlyss Queen MD, by Tamsen Roers, at [facility].  This patient was seen in room 1 and the patient's care was started at 10:42 AM.   Chief Complaint  Patient presents with  . Medication Refill     Patient ID: Kelly Ray, female    DOB: Mar 27, 1941, 74 y.o.   MRN: [id number]  HPI HPI Comments: Kelly Ray is a 75 y.o. female with B-cell lymphoma who presents to the [facility] for a refill on her medications. Patient has been going through radiation and got a staph infection on the back on her right leg.  She will be seeing Dr. Ammie Dalton tomorrow and will be seeing a dermatologist for four other spots on her leg that need to be scraped. Patient has a dermatologist who she sees frequently.   Patient would like a flu shot today.  She is up to date on her pneumonia vaccinations.     Patient Active Problem List   Diagnosis Date Noted  . Cellulitis and abscess of leg 04/05/2016  . 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 Cancer ([place])   . B-cell lymphoma ([place]) 07/17/2011   Past Medical History:  Diagnosis Date  . Arthritis    Osteoarthritis  . B-cell lymphoma ([place]) 05/04/11   Large B-cell lymphoma  . Cancer ([place])    NHL  . Hx of radiation therapy 08/17/13- 09/03/13   right lower extremity- lymphoma  . Hypertension   . S/P knee replacement    Left knee  . Thyroid disease    Hyperthyroidism   Past Surgical History:  Procedure Laterality Date  . NECK LESION BIOPSY Right 05/04/11   node biopsies  . TOTAL KNEE ARTHROPLASTY Left    x 2   Allergies  Allergen Reactions  . Lisinopril Cough   Prior to Admission medications   Medication Sig Start Date End Date Taking? Authorizing Provider  levothyroxine (SYNTHROID, LEVOTHROID) 125 MCG tablet TAKE 1 TABLET EVERY MORNING 02/09/16   Darlyne Russian, MD  losartan-hydrochlorothiazide (HYZAAR) 50-12.5 MG  tablet Take 1 tablet by mouth daily. 06/07/15   Darlyne Russian, MD  metoprolol tartrate (LOPRESSOR) 25 MG tablet Take 25 mg by mouth 2 (two) times daily. 12/14/15   Historical Provider, MD  pravastatin (PRAVACHOL) 20 MG tablet Take 1 tablet (20 mg total) by mouth at bedtime. 04/25/15   Darlyne Russian, MD   Social History   Social History  . Marital status: Married    Spouse name: N/A  . Number of children: N/A  . Years of education: N/A   Occupational History  . Not on file.   Social History Main Topics  . Smoking status: Never Smoker  . Smokeless tobacco: Not on file  . Alcohol use No  . Drug use: No  . Sexual activity: Not on file   Other Topics Concern  . Not on file   Social History Narrative  . No narrative on file     Review of Systems  Constitutional: Negative for chills and fever.  Eyes: Negative for pain, redness and itching.  Respiratory: Negative for cough and shortness of breath.   Gastrointestinal: Negative for nausea and vomiting.  Musculoskeletal: Negative for neck pain and neck stiffness.  Skin: Positive for color change and rash.  Neurological: Negative for syncope and speech difficulty.       Objective:   Physical  Exam Vitals:   05/07/16 1041  BP: 120/78  Pulse: (!) 59  Resp: 18  Temp: 98.1 F (36.7 C)  TempSrc: Oral  SpO2: 98%  Weight: 189 lb 6.4 oz (85.9 kg)  Height: 5\' 2"  (1.575 m)     CONSTITUTIONAL: Well developed/well nourished HEAD: Normocephalic/atraumatic EYES: EOMI/PERRL ENMT: Mucous membranes moist NECK: supple no meningeal signs SPINE/BACK:entire spine nontender CV: S1/S2 noted, no murmurs/rubs/gallops noted LUNGS: Lungs are clear to auscultation bilaterally, no apparent distress NEURO: Pt is awake/alert/appropriate, moves all extremitiesx4.  No facial droop.   SKIN: she has dry scaly patches over both calves measuring 1-2 cm in size.  She has a healed radiated area involving the right calf.  PSYCH: no abnormalities of mood  noted, alert and oriented to situation   Results for orders placed or performed in visit on 05/07/16  POCT CBC  Result Value Ref Range   WBC 5.1 4.6 - 10.2 K/uL   Lymph, poc 1.8 0.6 - 3.4   POC LYMPH PERCENT 35.3 10 - 50 %L   MID (cbc) 0.6 0 - 0.9   POC MID % 11.1 0 - 12 %M   POC Granulocyte 2.7 2 - 6.9   Granulocyte percent 53.6 37 - 80 %G   RBC 4.58 4.04 - 5.48 M/uL   Hemoglobin 14.1 12.2 - 16.2 g/dL   HCT, POC 40.8 37.7 - 47.9 %   MCV 89.1 80 - 97 fL   MCH, POC 30.9 27 - 31.2 pg   MCHC 34.7 31.8 - 35.4 g/dL   RDW, POC 12.7 %   Platelet Count, POC 179 142 - 424 K/uL   MPV 8.3 0 - 99.8 fL  POCT Skin KOH  Result Value Ref Range   Skin KOH, POC Positive    Results for orders placed or performed in visit on 05/07/16  COMPLETE METABOLIC PANEL WITH GFR  Result Value Ref Range   Sodium 139 135 - 146 mmol/L   Potassium 5.1 3.5 - 5.3 mmol/L   Chloride 102 98 - 110 mmol/L   CO2 29 20 - 31 mmol/L   Glucose, Bld 98 65 - 99 mg/dL   BUN 13 7 - 25 mg/dL   Creat 0.67 0.60 - 0.93 mg/dL   Total Bilirubin 0.8 0.2 - 1.2 mg/dL   Alkaline Phosphatase 82 33 - 130 U/L   AST 18 10 - 35 U/L   ALT 14 6 - 29 U/L   Total Protein 6.5 6.1 - 8.1 g/dL   Albumin 4.2 3.6 - 5.1 g/dL   Calcium 9.5 8.6 - 10.4 mg/dL   GFR, Est African American >89 >=60 mL/min   GFR, Est Non African American 87 >=60 mL/min  Lipid panel  Result Value Ref Range   Cholesterol 239 (H) 125 - 200 mg/dL   Triglycerides 133 <150 mg/dL   HDL 80 >=46 mg/dL   Total CHOL/HDL Ratio 3.0 <=5.0 Ratio   VLDL 27 <30 mg/dL   LDL Cholesterol 132 (H) <130 mg/dL  TSH  Result Value Ref Range   TSH 0.25 (L) mIU/L  POCT CBC  Result Value Ref Range   WBC 5.1 4.6 - 10.2 K/uL   Lymph, poc 1.8 0.6 - 3.4   POC LYMPH PERCENT 35.3 10 - 50 %L   MID (cbc) 0.6 0 - 0.9   POC MID % 11.1 0 - 12 %M   POC Granulocyte 2.7 2 - 6.9   Granulocyte percent 53.6 37 - 80 %G   RBC 4.58 4.04 -  5.48 M/uL   Hemoglobin 14.1 12.2 - 16.2 g/dL   HCT, POC 40.8  37.7 - 47.9 %   MCV 89.1 80 - 97 fL   MCH, POC 30.9 27 - 31.2 pg   MCHC 34.7 31.8 - 35.4 g/dL   RDW, POC 12.7 %   Platelet Count, POC 179 142 - 424 K/uL   MPV 8.3 0 - 99.8 fL  POCT Skin KOH  Result Value Ref Range   Skin KOH, POC Positive        Assessment & Plan:  The lesions on the left leg were positive KOH. Will treat this with Lotrimin cream. She will keep her follow-up appointment with the dermatologist. She does have a history of lymphoma of the skin involving the right calf. I personally performed the services described in this documentation, which was scribed in my presence. The recorded information has been reviewed and is accurate.Her cholesterol was elevated and not at goal and her viral he dosage appears to be too high. We'll decrease Synthroid to 0.112 and increase pravastatin to 40 mg. Darlyne Russian, MD

## 2016-05-07 NOTE — Patient Instructions (Signed)
     IF you received an x-ray today, you will receive an invoice from [facility]. Please contact [facility] at [phone] with questions or concerns regarding your invoice.   IF you received labwork today, you will receive an invoice from [facility]. Please contact [facility] at [phone] with questions or concerns regarding your invoice.   Our billing staff will not be able to assist you with questions regarding bills from these companies.  You will be contacted with the lab results as soon as they are available. The fastest way to get your results is to activate your My Chart account. Instructions are located on the last page of this paperwork. If you have not heard from us regarding the results in 2 weeks, please contact this office.      

## 2016-05-08 ENCOUNTER — Ambulatory Visit (HOSPITAL_BASED_OUTPATIENT_CLINIC_OR_DEPARTMENT_OTHER): Payer: Medicare Other | Admitting: Nurse Practitioner

## 2016-05-08 ENCOUNTER — Telehealth: Payer: Self-pay | Admitting: Oncology

## 2016-05-08 VITALS — BP 123/70 | HR 58 | Temp 97.4°F | Resp 17 | Ht 62.0 in | Wt 187.2 lb

## 2016-05-08 DIAGNOSIS — E039 Hypothyroidism, unspecified: Secondary | ICD-10-CM | POA: Diagnosis not present

## 2016-05-08 DIAGNOSIS — C8335 Diffuse large B-cell lymphoma, lymph nodes of inguinal region and lower limb: Secondary | ICD-10-CM | POA: Diagnosis not present

## 2016-05-08 DIAGNOSIS — I10 Essential (primary) hypertension: Secondary | ICD-10-CM | POA: Diagnosis not present

## 2016-05-08 DIAGNOSIS — E7800 Pure hypercholesterolemia, unspecified: Secondary | ICD-10-CM

## 2016-05-08 DIAGNOSIS — M1990 Unspecified osteoarthritis, unspecified site: Secondary | ICD-10-CM

## 2016-05-08 DIAGNOSIS — L989 Disorder of the skin and subcutaneous tissue, unspecified: Secondary | ICD-10-CM

## 2016-05-08 MED ORDER — LEVOTHYROXINE SODIUM 112 MCG PO TABS: ORAL_TABLET | ORAL | 3 refills | Status: AC

## 2016-05-08 MED ORDER — PRAVASTATIN SODIUM 40 MG PO TABS: 40.0000 mg | ORAL_TABLET | Freq: Every day | ORAL | 3 refills | Status: DC

## 2016-05-08 NOTE — Addendum Note (Signed)
Addended by: Arlyss Queen A on: 05/08/2016 08:18 AM   Modules accepted: Orders

## 2016-05-08 NOTE — Telephone Encounter (Signed)
Avs report and appointment schd given per 05/08/16 los. °

## 2016-05-08 NOTE — Progress Notes (Addendum)
[place] OFFICE PROGRESS NOTE   Diagnosis:  Non-Hodgkin's lymphoma  INTERVAL HISTORY:   Kelly Ray returns for follow-up. She reports the right leg lesions have improved since completing radiation. She continues to note a scab at the right medial lower leg. She has follow-up with dermatology in approximately one month regarding left leg lesions. A week and a half ago she noted a new skin lesion at the left posterior leg.  Objective:  Vital signs in last 24 hours:  Blood pressure 123/70, pulse (!) 58, temperature 97.4 F (36.3 C), temperature source Oral, resp. rate 17, height _0  (1.575 m), weight 187 lb 3.2 oz (84.9 kg), SpO2 100 %.    HEENT: No thrush or ulcers. Lymphatics: No palpable cervical, supra clavicular, axillary or inguinal lymph nodes. Resp: Lungs clear bilaterally. Cardio: Regular rate and rhythm. GI: Abdomen soft and nontender. No organomegaly. Vascular: No leg edema.  Skin: Scarring of the skin right upper pretibial region; 1 cm nodular subcutaneous lesion at the right medial upper calf; scab/indurated lesion with surrounding erythema right lower posterior leg.   Lab Results:  Lab Results  Component Value Date   WBC 5.1 05/07/2016   HGB 14.1 05/07/2016   HCT 40.8 05/07/2016   MCV 89.1 05/07/2016   PLT 167 02/23/2016   NEUTROABS 1.6 02/23/2016    Imaging:  No results found.  Medications: I have reviewed the patient's current medications.  Assessment/Plan: 1. 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 Negative restaging CT evaluation 10/24/2012  Nodular skin lesions at the right lower leg, status post a shave biopsy 07/15/2013 confirming a malignant B-cell lymphoma, diffuse large cell positive for CD20, BCL 6, BCL 2, and CD10   Staging bone marrow biopsy 07/31/2013, negative for involvement with lymphoma   Staging PET scan  07/30/2013-negative.   Status post palliative radiation right lower leg nodular skin lesions 08/17/2013 through 09/03/2013.  New nodular skin lesions at the right lower leg April 2017, status postpsies of lesions at the right lower leg 12/07/2015 confirming large B-cell lymphoma, CD20 positive bio  staging PET scan 12/26/2015-hypermetabolic cutaneous and subcutaneous nodules in the right lower extremity, no other evidence of lymphoma  Cycle 1 bendamustine/Rituxan 01/03/2016  Cycle 2 bendamustine/Rituxan 01/31/2016  Clinical progression with enlargement of multiple right lower leg skin lesions 02/23/2016  Status post radiation right lower leg 03/14/2016 through 03/29/2016 2. Port-A-Cath placement 06/01/2011. Port-A-Cath removal 11/04/2012 3. Hypertension. 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 5. Hypercholesterolemia. 6. Osteoarthritis. 7. Malaise-? Secondary to progression of non-Hodgkin's lymphoma. Improved. 8. Staph infection right lower leg February 2015. 9. Right calf lesion with purulent drainage 02/09/2016, culture obtained-group B strep, doxycycline prescribed   Disposition: Kelly Ray appears stable. The lesions at the right lower leg have improved since completing the course of radiation. However, we remain concerned for persistent lymphoma. We will follow with observation for now. She is agreeable to return for a follow-up visit in approximately 5 weeks but will contact the office sooner with any changes in the existing skin lesions or development of any new lesions.  Patient seen with Dr. Benay Spice. 25 minutes were spent face-to-face at today's visit with the majority of that time involved in counseling/coordination of care.    Ned Card ANP/GNP-BC   05/08/2016  3:28 PM  This was a shared visit with Ned Card. Kelly Ray was interviewed and examined. Kelly Ray completed palliative radiation. The dominant lesion at the  right pretibial area has regressed. However is persistent  induration surrounding an eschar at the right posterior calf and there is a deep subcutaneous nodular lesion superior to this.  She will return for an office visit in approximately 5 weeks.  Julieanne Manson, M.D.

## 2016-06-03 ENCOUNTER — Other Ambulatory Visit: Payer: Self-pay | Admitting: Emergency Medicine

## 2016-06-07 ENCOUNTER — Other Ambulatory Visit: Payer: Self-pay | Admitting: Dermatology

## 2016-06-11 ENCOUNTER — Telehealth: Payer: Self-pay | Admitting: Oncology

## 2016-06-11 ENCOUNTER — Ambulatory Visit (HOSPITAL_BASED_OUTPATIENT_CLINIC_OR_DEPARTMENT_OTHER): Payer: Medicare Other | Admitting: Oncology

## 2016-06-11 VITALS — BP 131/71 | HR 64 | Temp 97.7°F | Resp 17 | Ht 62.0 in | Wt 192.8 lb

## 2016-06-11 DIAGNOSIS — E785 Hyperlipidemia, unspecified: Secondary | ICD-10-CM

## 2016-06-11 DIAGNOSIS — E039 Hypothyroidism, unspecified: Secondary | ICD-10-CM

## 2016-06-11 DIAGNOSIS — C8335 Diffuse large B-cell lymphoma, lymph nodes of inguinal region and lower limb: Secondary | ICD-10-CM

## 2016-06-11 DIAGNOSIS — I10 Essential (primary) hypertension: Secondary | ICD-10-CM | POA: Diagnosis not present

## 2016-06-11 DIAGNOSIS — M1990 Unspecified osteoarthritis, unspecified site: Secondary | ICD-10-CM

## 2016-06-11 NOTE — Telephone Encounter (Signed)
Gave patient avs report and appointments for November.  °

## 2016-06-11 NOTE — Progress Notes (Signed)
  [place] OFFICE PROGRESS NOTE   Diagnosis: Non-Hodgkin's lymphoma  INTERVAL HISTORY:   Kelly Ray returns as scheduled. She underwent biopsy of a left pretibial lesion by Dr. Denna Haggard on 06/06/2016. The pathology report is pending. She has noted increased nodularity in the right calf. She has noted new nodules inferior to a healing eschar at the right calf  Objective:  Vital signs in last 24 hours:  Blood pressure 131/71, pulse 64, temperature 97.7 F (36.5 C), temperature source Oral, resp. rate 17, height '5\' 2"'$  (1.575 m), weight 192 lb 12.8 oz (87.5 kg), SpO2 99 %.    HEENT:  Neck without mass Lymphatics:  No cervical, supraclavicular, axillary, or inguinal nodes Resp:  Lungs clear bilaterally Cardio:  Regular rate and rhythm GI:  No hepatosplenomegaly, no mass Vascular:  No leg edema  Skin: fresh biopsy site at the left pretibial region. No cutaneous nodules at the left lower leg. Increased nodularity surrounding a hyperpigmented area at the left upper posterior calf. Inferior to a healing eschar at the lower left calf there are 2 approximatel1 cm deep cutaneous nodules     Lab Results:  Lab Results  Component Value Date   WBC 5.1 05/07/2016   HGB 14.1 05/07/2016   HCT 40.8 05/07/2016   MCV 89.1 05/07/2016   PLT 167 02/23/2016   NEUTROABS 1.6 02/23/2016     Medications: I have reviewed the patient's current medications.  Assessment/Plan: 1. 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 Negative restaging CT evaluation 10/24/2012  Nodular skin lesions at the right lower leg, status post a shave biopsy 07/15/2013 confirming a malignant B-cell lymphoma, diffuse large cell positive for CD20, BCL 6, BCL 2, and CD10   Staging bone marrow biopsy 07/31/2013, negative for involvement with lymphoma   Staging PET scan 07/30/2013-negative.   Status post  palliative radiation right lower leg nodular skin lesions 08/17/2013 through 09/03/2013.  New nodular skin lesions at the right lower leg April 2017, status postpsies of lesions at the right lower leg 12/07/2015 confirming large B-cell lymphoma, CD20 positive bio  staging PET scan 12/26/2015-hypermetabolic cutaneous and subcutaneous nodules in the right lower extremity, no other evidence of lymphoma  Cycle 1 bendamustine/Rituxan 01/03/2016  Cycle 2 bendamustine/Rituxan 01/31/2016  Clinical progression with enlargement of multiple right lower leg skin lesions 02/23/2016  Status post radiation right lower leg 03/14/2016 through 03/29/2016 2. Port-A-Cath placement 06/01/2011. Port-A-Cath removal 11/04/2012 3. Hypertension. 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 5. Hypercholesterolemia. 6. Osteoarthritis. 7. Malaise-? Secondary to progression of non-Hodgkin's lymphoma. Improved. 8. Staph infection right lower leg February 2015. 9. Right calf lesion with purulent drainage 02/09/2016, culture obtained-group B strep, doxycycline prescribed    Disposition:  Kelly Ray appears well. There is progressive nodularity at the dominant left upper calf lesion and 2 smaller lesions are noted at the inferior left calf. I suspect these areas represent progressive/persistent lymphoma.  I discussed treatment options with Kelly Ray. She is most comfortable with observation for now. She does not wish to consider stem cell therapy. She understands I will recommend systemic therapy if the lesions continue to progress. We can consider CHOP, EPOCH, of ICE with rituximab.  She will follow-up on the pathology report from the left lower leg biopsy. Kelly Ray will return for an office visit in 3 weeks.  Betsy Coder, MD  06/11/2016  4:02 PM

## 2016-06-23 ENCOUNTER — Telehealth: Payer: Self-pay

## 2016-06-23 NOTE — Telephone Encounter (Signed)
Patient requests labs be mailed to her home.

## 2016-06-28 NOTE — Telephone Encounter (Signed)
Mailed and notified pt on VM.

## 2016-06-29 ENCOUNTER — Telehealth: Payer: Self-pay | Admitting: *Deleted

## 2016-06-29 NOTE — Telephone Encounter (Signed)
Patient called to have her appt moved from 11/10 to 11/9. No available on 11/9, patient will leave appt on 11/10

## 2016-07-03 ENCOUNTER — Telehealth: Payer: Self-pay | Admitting: Nurse Practitioner

## 2016-07-03 NOTE — Telephone Encounter (Signed)
Returned call to patient in regards to her 11/10 appointment.

## 2016-07-06 ENCOUNTER — Ambulatory Visit (HOSPITAL_BASED_OUTPATIENT_CLINIC_OR_DEPARTMENT_OTHER): Payer: Medicare Other | Admitting: Nurse Practitioner

## 2016-07-06 ENCOUNTER — Telehealth: Payer: Self-pay | Admitting: Oncology

## 2016-07-06 ENCOUNTER — Telehealth: Payer: Self-pay | Admitting: *Deleted

## 2016-07-06 VITALS — BP 117/52 | HR 67 | Temp 97.7°F | Resp 18 | Wt 191.2 lb

## 2016-07-06 DIAGNOSIS — C8335 Diffuse large B-cell lymphoma, lymph nodes of inguinal region and lower limb: Secondary | ICD-10-CM | POA: Diagnosis not present

## 2016-07-06 DIAGNOSIS — E785 Hyperlipidemia, unspecified: Secondary | ICD-10-CM

## 2016-07-06 DIAGNOSIS — I10 Essential (primary) hypertension: Secondary | ICD-10-CM | POA: Diagnosis not present

## 2016-07-06 DIAGNOSIS — E039 Hypothyroidism, unspecified: Secondary | ICD-10-CM | POA: Diagnosis not present

## 2016-07-06 DIAGNOSIS — M1990 Unspecified osteoarthritis, unspecified site: Secondary | ICD-10-CM

## 2016-07-06 NOTE — Telephone Encounter (Signed)
Per 07/06/16 los, spoke with Tiffany regarding Port placement. Port appointment scheduled scheduled for 07/13/16 @ 12 noon. Patient instructed NPO 4 hrs prior. Message sent to [facility] regarding ECHO prior authorization. Message sent to Chemo scheduler to add chemo per 07/06/16 los. Follow up with Lattie Haw on 11/21 @ 8:45 a.m. Per 07/06/16 los.  Appointments scheduled per 07/06/16 los. AVS report and appointment schedule given to patient per 07/06/16 los.

## 2016-07-06 NOTE — Progress Notes (Addendum)
[place] OFFICE PROGRESS NOTE   Diagnosis:  Non-Hodgkin's lymphoma  INTERVAL HISTORY:   Kelly Ray returns as scheduled. She notes more skin nodules over the right posterior leg. The lesions are tender. No fevers or sweats. Good appetite and good energy level.  Objective:  Vital signs in last 24 hours:  Blood pressure (!) 117/52, pulse 67, temperature 97.7 F (36.5 C), temperature source Oral, resp. rate 18, weight 191 lb 3.7 oz (86.7 kg), SpO2 99 %.    Lymphatics: No palpable cervical, supra clavicular or axillary lymph nodes. Resp: Lungs clear bilaterally. Cardio: Regular rate and rhythm. GI: Abdomen soft and nontender. No organomegaly. Vascular: No leg edema.  Skin: 4 or 5 cutaneous lesions at the right posterior calf region. The largest measures 2 cm, remainder approximately 1 cm.    Lab Results:  Lab Results  Component Value Date   WBC 5.1 05/07/2016   HGB 14.1 05/07/2016   HCT 40.8 05/07/2016   MCV 89.1 05/07/2016   PLT 167 02/23/2016   NEUTROABS 1.6 02/23/2016    Imaging:  No results found.  Medications: I have reviewed the patient's current medications.  Assessment/Plan: 1. 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 Negative restaging CT evaluation 10/24/2012  Nodular skin lesions at the right lower leg, status post a shave biopsy 07/15/2013 confirming a malignant B-cell lymphoma, diffuse large cell positive for CD20, BCL 6, BCL 2, and CD10   Staging bone marrow biopsy 07/31/2013, negative for involvement with lymphoma   Staging PET scan 07/30/2013-negative.   Status post palliative radiation right lower leg nodular skin lesions 08/17/2013 through 09/03/2013.  New nodular skin lesions at the right lower leg April 2017, status postpsies of lesions at the right lower leg 12/07/2015 confirming large B-cell lymphoma, CD20 positive  bio  staging PET scan 12/26/2015-hypermetabolic cutaneous and subcutaneous nodules in the right lower extremity, no other evidence of lymphoma  Cycle 1 bendamustine/Rituxan 01/03/2016  Cycle 2 bendamustine/Rituxan 01/31/2016  Clinical progression with enlargement of multiple right lower leg skin lesions 02/23/2016  Status post radiation right lower leg 03/14/2016 through 03/29/2016  Clinical progression with new and enlarging skin lesions right lower leg 07/06/2016 2. Port-A-Cath placement 06/01/2011. Port-A-Cath removal 11/04/2012 3. Hypertension. 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 5. Hypercholesterolemia. 6. Osteoarthritis. 7. Malaise-? Secondary to progression of non-Hodgkin's lymphoma. Improved. 8. Staph infection right lower leg February 2015. 9. Right calf lesion with purulent drainage 02/09/2016, culture obtained-group B strep, doxycycline prescribed   Disposition: Kelly Ray has new and increased nodules at the right posterior calf region. She understands the non-Hodgkin's lymphoma is progressing. She does not wish to consider stem cell therapy. We discussed systemic treatment options including CHOP, EPOCH, ICE with Rituxan. She is not interested in any regimen that would require hospitalization. She is agreeable to CHOP/Rituxan. We reviewed potential toxicities associated with the chemotherapy including myelosuppression, nausea, hair loss, mouth sores, diarrhea or constipation. We discussed the cardiotoxicity associated with Adriamycin. She has received Adriamycin in the past. She understands the incidence of heart failure increases with cumulative exposure. We will obtain a baseline 2-D echo. She will receive Zinecard as a cardioprotectant. After 3 cycles with Adriamycin consideration will be given to eliminating Adriamycin and substituting etoposide. We also discussed that Adriamycin is a vesicant. She will be referred for Port-A-Cath placement. We reviewed potential toxicities  associated with Rituxan including an infusion-related reaction, an allergic reaction, reactivation of hepatitis B.  We will obtain baseline hepatitis B studies. We will forego Neulasta with cycle 1 as she had severe bone pain in the past with Neulasta. She understands it may be necessary to add Neulasta with subsequent cycles. A baseline PET scan will be obtained in the next 7-10 days.  She will return for a follow-up visit and cycle 1 CHOP/Rituxan 07/17/2016. She will contact the office in the interim with any problems.  Patient seen with Dr. Benay Spice. 30 minutes were spent face-to-face at today's visit with the majority of that time involved in counseling/coordination of care.    Ned Card ANP/GNP-BC   07/06/2016  2:09 PM  This was a shared visit with Ned Card. Kelly Ray was interviewed and examined. She has progressive lymphoma involving multiple nodules at the right leg. She will complete a staging PET scan prior to beginning salvage therapy.  We discussed treatment options with Kelly Ray. She does not wish to consider stem cell therapy or inpatient chemotherapy. The plan is to begin treatment with CHOP/rituximab. We reviewed the potential toxicities associated with this regimen including the increased chance of cardiac toxicity with additional doxorubicin. She agrees to proceed.  Julieanne Manson, M.D.

## 2016-07-06 NOTE — Telephone Encounter (Signed)
Per LOS I have scheduled appts and notified the scheduler 

## 2016-07-09 ENCOUNTER — Telehealth: Payer: Self-pay | Admitting: Oncology

## 2016-07-09 ENCOUNTER — Telehealth: Payer: Self-pay | Admitting: *Deleted

## 2016-07-09 ENCOUNTER — Other Ambulatory Visit: Payer: Self-pay | Admitting: Oncology

## 2016-07-09 NOTE — Telephone Encounter (Signed)
Returned call to pt, confirmed with Ned Card, NP: she does need another ECHO for baseline before treatment. Pt will keep ECHO appt 11/14 as scheduled. PET scheduled for 11/16. Has port placement on 11/17.

## 2016-07-09 NOTE — Telephone Encounter (Signed)
Called patient regarding Echo appointment.  Appointment scheduled for 07/10/16 @ 11:00 a.m at [facility].  Patient is aware of Echo appointment.

## 2016-07-10 ENCOUNTER — Ambulatory Visit (HOSPITAL_COMMUNITY)
Admission: RE | Admit: 2016-07-10 | Discharge: 2016-07-10 | Disposition: A | Discharge status: Home / Self Care | Payer: Medicare Other | Source: Ambulatory Visit | Attending: Nurse Practitioner | Admitting: Nurse Practitioner

## 2016-07-10 DIAGNOSIS — I5030 Unspecified diastolic (congestive) heart failure: Secondary | ICD-10-CM | POA: Insufficient documentation

## 2016-07-10 DIAGNOSIS — I351 Nonrheumatic aortic (valve) insufficiency: Secondary | ICD-10-CM | POA: Insufficient documentation

## 2016-07-10 DIAGNOSIS — C8335 Diffuse large B-cell lymphoma, lymph nodes of inguinal region and lower limb: Secondary | ICD-10-CM | POA: Insufficient documentation

## 2016-07-10 LAB — ECHOCARDIOGRAM COMPLETE
CHL CUP DOP CALC LVOT VTI: 19.6 cm
CHL CUP MV DEC (S): 278
CHL CUP REG VEL DIAS: 117 cm/s
CHL CUP TV REG PEAK VELOCITY: 224 cm/s
EERAT: 8.18
EWDT: 278 ms
FS: 29 % (ref 28–44)
IVS/LV PW RATIO, ED: 1.08
LA diam index: 1.81 cm/m2
LA vol A4C: 38.7 ml
LASIZE: 34 mm
LAVOL: 47.7 mL
LAVOLIN: 25.4 mL/m2
LEFT ATRIUM END SYS DIAM: 34 mm
LV SIMPSON'S DISK: 51
LV dias vol: 60 mL (ref 46–106)
LV sys vol index: 16 mL/m2
LVDIAVOLIN: 32 mL/m2
LVEEAVG: 8.18
LVEEMED: 8.18
LVELAT: 9.25 cm/s
LVOT area: 3.46 cm2
LVOT diameter: 21 mm
LVOTPV: 78.3 cm/s
LVOTSV: 68 mL
LVSYSVOL: 29 mL (ref 14–42)
MV pk A vel: 72.3 m/s
MVPG: 2 mmHg
MVPKEVEL: 75.7 m/s
PV Reg grad dias: 5 mmHg
PW: 9.61 mm — AB (ref 0.6–1.1)
Stroke v: 30 ml
TDI e' lateral: 9.25
TDI e' medial: 5.22
TR max vel: 224 cm/s

## 2016-07-10 NOTE — Progress Notes (Signed)
  Echocardiogram 2D Echocardiogram has been performed.  Tresa Res 07/10/2016, 12:07 PM

## 2016-07-11 ENCOUNTER — Other Ambulatory Visit: Payer: Self-pay | Admitting: Radiology

## 2016-07-12 ENCOUNTER — Encounter (HOSPITAL_COMMUNITY)
Admission: RE | Admit: 2016-07-12 | Discharge: 2016-07-12 | Disposition: A | Discharge status: Home / Self Care | Payer: Medicare Other | Source: Ambulatory Visit | Attending: Nurse Practitioner | Admitting: Nurse Practitioner

## 2016-07-12 ENCOUNTER — Other Ambulatory Visit: Payer: Self-pay | Admitting: Radiology

## 2016-07-12 ENCOUNTER — Telehealth: Payer: Self-pay | Admitting: *Deleted

## 2016-07-12 DIAGNOSIS — C8335 Diffuse large B-cell lymphoma, lymph nodes of inguinal region and lower limb: Secondary | ICD-10-CM | POA: Diagnosis present

## 2016-07-12 LAB — GLUCOSE, CAPILLARY: GLUCOSE-CAPILLARY: 95 mg/dL (ref 65–99)

## 2016-07-12 MED ORDER — FLUDEOXYGLUCOSE F - 18 (FDG) INJECTION
9.2700 | Freq: Once | INTRAVENOUS | Status: AC | PRN
  Administered 2016-07-12: 9.27 via INTRAVENOUS

## 2016-07-12 NOTE — Telephone Encounter (Signed)
Message from pt requesting PET result. PET report reviewed by Dr. Benay Spice: Shows activity in the leg that we knew about. Mention of uptake in clavicle, likely nothing to worry about. Proceed with port placement and treatment as scheduled. Pt voiced understanding, appointments reviewed.

## 2016-07-13 ENCOUNTER — Encounter (HOSPITAL_COMMUNITY): Payer: Self-pay

## 2016-07-13 ENCOUNTER — Other Ambulatory Visit: Payer: Self-pay | Admitting: Nurse Practitioner

## 2016-07-13 ENCOUNTER — Ambulatory Visit (HOSPITAL_COMMUNITY)
Admission: RE | Admit: 2016-07-13 | Discharge: 2016-07-13 | Disposition: A | Discharge status: Home / Self Care | Payer: Medicare Other | Source: Ambulatory Visit | Attending: Nurse Practitioner | Admitting: Nurse Practitioner

## 2016-07-13 ENCOUNTER — Ambulatory Visit (HOSPITAL_COMMUNITY)
Admission: RE | Admit: 2016-07-13 | Discharge: 2016-07-13 | Disposition: A | Discharge status: Home / Self Care | Payer: Medicare Other | Source: Ambulatory Visit | Attending: Interventional Radiology | Admitting: Interventional Radiology

## 2016-07-13 DIAGNOSIS — Z923 Personal history of irradiation: Secondary | ICD-10-CM | POA: Diagnosis not present

## 2016-07-13 DIAGNOSIS — C8515 Unspecified B-cell lymphoma, lymph nodes of inguinal region and lower limb: Secondary | ICD-10-CM | POA: Diagnosis present

## 2016-07-13 DIAGNOSIS — Z79899 Other long term (current) drug therapy: Secondary | ICD-10-CM | POA: Insufficient documentation

## 2016-07-13 DIAGNOSIS — C8335 Diffuse large B-cell lymphoma, lymph nodes of inguinal region and lower limb: Secondary | ICD-10-CM

## 2016-07-13 DIAGNOSIS — I10 Essential (primary) hypertension: Secondary | ICD-10-CM | POA: Diagnosis not present

## 2016-07-13 DIAGNOSIS — Z9221 Personal history of antineoplastic chemotherapy: Secondary | ICD-10-CM | POA: Diagnosis not present

## 2016-07-13 HISTORY — PX: IR GENERIC HISTORICAL: IMG1180011

## 2016-07-13 LAB — CBC WITH DIFFERENTIAL/PLATELET
BASOS PCT: 0 %
Basophils Absolute: 0 10*3/uL (ref 0.0–0.1)
EOS ABS: 0.1 10*3/uL (ref 0.0–0.7)
EOS PCT: 2 %
HCT: 39.9 % (ref 36.0–46.0)
Hemoglobin: 13.2 g/dL (ref 12.0–15.0)
LYMPHS ABS: 1.1 10*3/uL (ref 0.7–4.0)
Lymphocytes Relative: 18 %
MCH: 29.9 pg (ref 26.0–34.0)
MCHC: 33.1 g/dL (ref 30.0–36.0)
MCV: 90.3 fL (ref 78.0–100.0)
MONOS PCT: 7 %
Monocytes Absolute: 0.4 10*3/uL (ref 0.1–1.0)
NEUTROS PCT: 73 %
Neutro Abs: 4.5 10*3/uL (ref 1.7–7.7)
PLATELETS: 234 10*3/uL (ref 150–400)
RBC: 4.42 MIL/uL (ref 3.87–5.11)
RDW: 13.5 % (ref 11.5–15.5)
WBC: 6.2 10*3/uL (ref 4.0–10.5)

## 2016-07-13 LAB — PROTIME-INR
INR: 1
Prothrombin Time: 13.2 s (ref 11.4–15.2)

## 2016-07-13 MED ORDER — LIDOCAINE HCL 1 % IJ SOLN
INTRAMUSCULAR | Status: AC
  Filled 2016-07-13: qty 20

## 2016-07-13 MED ORDER — MIDAZOLAM HCL 2 MG/2ML IJ SOLN
INTRAMUSCULAR | Status: AC
  Filled 2016-07-13: qty 6

## 2016-07-13 MED ORDER — HEPARIN SOD (PORK) LOCK FLUSH 100 UNIT/ML IV SOLN
INTRAVENOUS | Status: AC
  Filled 2016-07-13: qty 5

## 2016-07-13 MED ORDER — SODIUM CHLORIDE 0.9 % IV SOLN
INTRAVENOUS | Status: DC
  Administered 2016-07-13: 13:00:00 via INTRAVENOUS

## 2016-07-13 MED ORDER — LIDOCAINE-EPINEPHRINE (PF) 2 %-1:200000 IJ SOLN
INTRAMUSCULAR | Status: AC
  Filled 2016-07-13: qty 20

## 2016-07-13 MED ORDER — LIDOCAINE-EPINEPHRINE (PF) 2 %-1:200000 IJ SOLN
INTRAMUSCULAR | Status: AC | PRN
  Administered 2016-07-13: 10 mL

## 2016-07-13 MED ORDER — LIDOCAINE HCL 1 % IJ SOLN
INTRAMUSCULAR | Status: AC | PRN
  Administered 2016-07-13: 10 mL

## 2016-07-13 MED ORDER — FENTANYL CITRATE (PF) 100 MCG/2ML IJ SOLN
INTRAMUSCULAR | Status: AC
  Filled 2016-07-13: qty 4

## 2016-07-13 MED ORDER — FENTANYL CITRATE (PF) 100 MCG/2ML IJ SOLN
INTRAMUSCULAR | Status: AC | PRN
  Administered 2016-07-13: 50 ug via INTRAVENOUS

## 2016-07-13 MED ORDER — MIDAZOLAM HCL 2 MG/2ML IJ SOLN
INTRAMUSCULAR | Status: AC | PRN
  Administered 2016-07-13: 0.5 mg via INTRAVENOUS
  Administered 2016-07-13 (×2): 1 mg via INTRAVENOUS
  Administered 2016-07-13: 0.5 mg via INTRAVENOUS

## 2016-07-13 MED ORDER — CEFAZOLIN SODIUM-DEXTROSE 2-4 GM/100ML-% IV SOLN
2.0000 g | INTRAVENOUS | Status: AC
  Administered 2016-07-13: 2 g via INTRAVENOUS
  Filled 2016-07-13: qty 100

## 2016-07-13 NOTE — Procedures (Signed)
Recurrent lymphoma  S/p RT IJ POWER PORT  Tip svcra No comp Stable Ready for use Full report in PACS

## 2016-07-13 NOTE — Sedation Documentation (Signed)
Patient is resting comfortably. 

## 2016-07-13 NOTE — Discharge Instructions (Signed)
Implanted Port Home Guide °An implanted port is a type of central line that is placed under the skin. Central lines are used to provide IV access when treatment or nutrition needs to be given through a person's veins. Implanted ports are used for long-term IV access. An implanted port may be placed because:  °· You need IV medicine that would be irritating to the small veins in your hands or arms.   °· You need long-term IV medicines, such as antibiotics.   °· You need IV nutrition for a long period.   °· You need frequent blood draws for lab tests.   °· You need dialysis.   °Implanted ports are usually placed in the chest area, but they can also be placed in the upper arm, the abdomen, or the leg. An implanted port has two main parts:  °· Reservoir. The reservoir is round and will appear as a small, raised area under your skin. The reservoir is the part where a needle is inserted to give medicines or draw blood.   °· Catheter. The catheter is a thin, flexible tube that extends from the reservoir. The catheter is placed into a large vein. Medicine that is inserted into the reservoir goes into the catheter and then into the vein.   °HOW WILL I CARE FOR MY INCISION SITE? °Do not get the incision site wet. Bathe or shower as directed by your health care provider.  °HOW IS MY PORT ACCESSED? °Special steps must be taken to access the port:  °· Before the port is accessed, a numbing cream can be placed on the skin. This helps numb the skin over the port site.   °· Your health care provider uses a sterile technique to access the port. °¨ Your health care provider must put on a mask and sterile gloves. °¨ The skin over your port is cleaned carefully with an antiseptic and allowed to dry. °¨ The port is gently pinched between sterile gloves, and a needle is inserted into the port. °· Only "non-coring" port needles should be used to access the port. Once the port is accessed, a blood return should be checked. This helps  ensure that the port is in the vein and is not clogged.   °· If your port needs to remain accessed for a constant infusion, a clear (transparent) bandage will be placed over the needle site. The bandage and needle will need to be changed every week, or as directed by your health care provider.   °· Keep the bandage covering the needle clean and dry. Do not get it wet. Follow your health care provider's instructions on how to take a shower or bath while the port is accessed.   °· If your port does not need to stay accessed, no bandage is needed over the port.   °WHAT IS FLUSHING? °Flushing helps keep the port from getting clogged. Follow your health care provider's instructions on how and when to flush the port. Ports are usually flushed with saline solution or a medicine called heparin. The need for flushing will depend on how the port is used.  °· If the port is used for intermittent medicines or blood draws, the port will need to be flushed:   °¨ After medicines have been given.   °¨ After blood has been drawn.   °¨ As part of routine maintenance.   °· If a constant infusion is running, the port may not need to be flushed.   °HOW LONG WILL MY PORT STAY IMPLANTED? °The port can stay in for as long as your health care   provider thinks it is needed. When it is time for the port to come out, surgery will be done to remove it. The procedure is similar to the one performed when the port was put in.  °WHEN SHOULD I SEEK IMMEDIATE MEDICAL CARE? °When you have an implanted port, you should seek immediate medical care if:  °· You notice a bad smell coming from the incision site.   °· You have swelling, redness, or drainage at the incision site.   °· You have more swelling or pain at the port site or the surrounding area.   °· You have a fever that is not controlled with medicine. °This information is not intended to replace advice given to you by your health care provider. Make sure you discuss any questions you have with  your health care provider. °Document Released: 08/13/2005 Document Revised: 06/03/2013 Document Reviewed: 04/20/2013 °Elsevier Interactive Patient Education © 2017 [employer] °Implanted Port Insertion, Care After °Refer to this sheet in the next few weeks. These instructions provide you with information on caring for yourself after your procedure. Your health care provider may also give you more specific instructions. Your treatment has been planned according to current medical practices, but problems sometimes occur. Call your health care provider if you have any problems or questions after your procedure. °WHAT TO EXPECT AFTER THE PROCEDURE °After your procedure, it is typical to have the following:  °· Discomfort at the port insertion site. Ice packs to the area will help. °· Bruising on the skin over the port. This will subside in 3-4 days. °HOME CARE INSTRUCTIONS °· After your port is placed, you will get a manufacturer's information card. The card has information about your port. Keep this card with you at all times.   °· Know what kind of port you have. There are many types of ports available.   °· Wear a medical alert bracelet in case of an emergency. This can help alert health care workers that you have a port.   °· The port can stay in for as long as your health care provider believes it is necessary.   °· A home health care nurse may give medicines and take care of the port.   °· You or a family member can get special training and directions for giving medicine and taking care of the port at home.   °SEEK MEDICAL CARE IF:  °· Your port does not flush or you are unable to get a blood return.   °· You have a fever or chills. °SEEK IMMEDIATE MEDICAL CARE IF: °· You have new fluid or pus coming from your incision.   °· You notice a bad smell coming from your incision site.   °· You have swelling, pain, or more redness at the incision or port site.   °· You have chest pain or shortness of breath. °This  information is not intended to replace advice given to you by your health care provider. Make sure you discuss any questions you have with your health care provider. °Document Released: 06/03/2013 Document Revised: 08/18/2013 Document Reviewed: 06/03/2013 °Elsevier Interactive Patient Education © 2017 [employer] °Moderate Conscious Sedation, Adult, Care After °These instructions provide you with information about caring for yourself after your procedure. Your health care provider may also give you more specific instructions. Your treatment has been planned according to current medical practices, but problems sometimes occur. Call your health care provider if you have any problems or questions after your procedure. °What can I expect after the procedure? °After your procedure, it is common: °·   To feel sleepy for several hours. °· To feel clumsy and have poor balance for several hours. °· To have poor judgment for several hours. °· To vomit if you eat too soon. °Follow these instructions at home: °For at least 24 hours after the procedure:  °· Do not: °¨ Participate in activities where you could fall or become injured. °¨ Drive. °¨ Use heavy machinery. °¨ Drink alcohol. °¨ Take sleeping pills or medicines that cause drowsiness. °¨ Make important decisions or sign legal documents. °¨ Take care of children on your own. °· Rest. °Eating and drinking °· Follow the diet recommended by your health care provider. °· If you vomit: °¨ Drink water, juice, or soup when you can drink without vomiting. °¨ Make sure you have little or no nausea before eating solid foods. °General instructions °· Have a responsible adult stay with you until you are awake and alert. °· Take over-the-counter and prescription medicines only as told by your health care provider. °· If you smoke, do not smoke without supervision. °· Keep all follow-up visits as told by your health care provider. This is important. °Contact a health care provider  if: °· You keep feeling nauseous or you keep vomiting. °· You feel light-headed. °· You develop a rash. °· You have a fever. °Get help right away if: °· You have trouble breathing. °This information is not intended to replace advice given to you by your health care provider. Make sure you discuss any questions you have with your health care provider. °Document Released: 06/03/2013 Document Revised: 01/16/2016 Document Reviewed: 12/03/2015 °Elsevier Interactive Patient Education © 2017 Elsevier Inc. ° °

## 2016-07-13 NOTE — Progress Notes (Signed)
Patient ID: Kelly Ray, female   DOB: Jan 09, 1941, 75 y.o.   MRN: [id number]    Referring Physician(s): Julieanne Manson, M.D.  Supervising Physician: Daryll Brod  Patient Status: [facility] outpatient  Chief Complaint: Non-hodgkin's lymphoma  Subjective: Kelly Ray is a 75 y.o. female with a history of thyroid disease, hypertension, and B cell lymphoma (2012) that was treated with chemotherapy and radiation. New nodules that presented in 06/2016 over the right posterior leg were biopsied and revealed the progression/persistence of Non-Hodgkin's lymphoma. She presents today for Port-A-Cath placement for future chemotherapy treatment. Familiar with IR service from 2 bone marrow biopsies, port-a-cath (removed 2014), and cervical lymph node biopsy. Patient denies fever, headache, chest pain, shortness of breath, abdominal pain,  nausea, vomiting, or bleeding. She does have intermittent back pain.   Past Medical History:  Diagnosis Date  . Arthritis    Osteoarthritis  . B-cell lymphoma ([place]) 05/04/11   Large B-cell lymphoma  . Cancer ([place])    NHL  . Hx of radiation therapy 08/17/13- 09/03/13   right lower extremity- lymphoma  . Hypertension   . S/P knee replacement    Left knee  . Thyroid disease    Hyperthyroidism   Past Surgical History:  Procedure Laterality Date  . NECK LESION BIOPSY Right 05/04/11   node biopsies  . TOTAL KNEE ARTHROPLASTY Left    x 2   Allergies: Lisinopril  Medications: Prior to Admission medications   Medication Sig Start Date End Date Taking? Authorizing Provider  levothyroxine (SYNTHROID, LEVOTHROID) 112 MCG tablet Take 1 tablet daily 05/08/16   Darlyne Russian, MD  losartan-hydrochlorothiazide (HYZAAR) 50-12.5 MG tablet TAKE 1 TABLET BY MOUTH DAILY. 06/03/16   Chelle Jeffery, PA-C  metoprolol tartrate (LOPRESSOR) 25 MG tablet Take 1 tablet (25 mg total) by mouth 2 (two) times daily. 05/07/16   Darlyne Russian, MD  pravastatin (PRAVACHOL) 40 MG tablet Take 1  tablet (40 mg total) by mouth at bedtime. 05/08/16   Darlyne Russian, MD     Vital Signs: Temp: 97.5, BP: 112/68, HR: 60, Resp: 16, O2 sats: 100% RA  Physical Exam Awake, alert, and oriented. Lungs CTAB. Heart rate and rhythm regular. Abdomen is soft, non-tender, non-distended. No noted edema on lower extremities, SQ nodules present on right lower extremity.   Imaging: Nm Pet Image Restage (ps) Whole Body  Result Date: 07/12/2016 CLINICAL DATA:  Subsequent treatment strategy for non-Hodgkin's lymphoma. Subcutaneous recurrence in the LEFT lower extremity RIGHT lower extremity. EXAM: NUCLEAR MEDICINE PET WHOLE BODY TECHNIQUE: 9.3 mCi F-18 FDG was injected intravenously. Full-ring PET imaging was performed from the vertex to the feet after the radiotracer. CT data was obtained and used for attenuation correction and anatomic localization. FASTING BLOOD GLUCOSE:  Value: 95 mg/dl COMPARISON:  12/26/2015 FINDINGS: NECK No hypermetabolic lymph nodes in the neck. CHEST No hypermetabolic mediastinal or hilar nodes. No suspicious pulmonary nodules on the CT scan. ABDOMEN/PELVIS No abnormal hypermetabolic activity within the liver, pancreas, adrenal glands, or spleen. No hypermetabolic lymph nodes in the abdomen or pelvis. SKELETON A single focus of metabolic activity in most medial aspect of the RIGHT clavicle with SUV max equals 6.2 increased from 4.6. There is mild scalloping of the bone at this level (image 69, series 3.) Bony changes similar to PET CT of 12/26/2015 but new from more remote CT exams. Extremities: Interval enlargement of subcutaneous nodule within the RIGHT lower extremity posterior the calf muscle measuring 3.4 cm with SUV max equal 29.4. Potential  tiny nodule at this site on comparison exam. Interestingly a more medial superficial lesion is decreased in size and metabolic activity SUV max equal 3 .6 decreased from 13.0. There is a minimal nodular thickening at this level (image 335 of series  3). Likewise interval reduction in the anterior cutaneous lesion image 310. There is a new lesion in the medial right thigh measuring 5 mm (image 252, series 3) with intense metabolic activity for size. IMPRESSION: 1. Clear interval increase in size and metabolic activity of a large Set to nodule posterior to the RIGHT calf and a smaller subcutaneous nodule medial to the RIGHT thigh. 2. Several of the more superficial RIGHT lower extremity lesions have improved with reduction in size and metabolic activity. 3. No evidence of metastatic disease in the pelvis or abdomen. 4. Single focus of metabolic activity associated with the medial RIGHT clavicle with bony change. Concern for skeletal metastasis versus infection or degeneration. Electronically Signed   By: Suzy Bouchard M.D.   On: 07/12/2016 10:35    Labs:  CBC:  Recent Labs  01/31/16 0853 02/09/16 1406 02/23/16 0841 05/07/16 1127 07/13/16 1255  WBC 4.1 3.9 3.1* 5.1 6.2  HGB 14.0 12.4 12.4 14.1 13.2  HCT 42.0 36.6 36.4 40.8 39.9  PLT 164 152 167  --  234    COAGS: No results for input(s): INR, APTT in the last 8760 hours.  BMP:  Recent Labs  01/31/16 0853 02/09/16 1406 02/23/16 0841 05/07/16 1113  NA 140 140 140 139  K 3.9 4.3 3.8 5.1  CL  --   --   --  102  CO2 25 29 27 29   GLUCOSE 113 150* 95 98  BUN 13.2 14.6 18.0 13  CALCIUM 9.4 8.6 8.6 9.5  CREATININE 0.9 0.8 0.7 0.67  GFRNONAA  --   --   --  87  GFRAA  --   --   --  >89    LIVER FUNCTION TESTS:  Recent Labs  01/31/16 0853 02/09/16 1406 02/23/16 0841 05/07/16 1113  BILITOT 0.54 0.58 0.59 0.8  AST 19 21 24 18   ALT 15 15 17 14   ALKPHOS 83 72 84 82  PROT 6.9 6.2* 5.7* 6.5  ALBUMIN 3.6 3.4* 3.1* 4.2    Assessment and Plan: Kelly Ray is a 75 y.o. female with a history of thyroid disease, hypertension, and B cell lymphoma (2012) that was treated with chemotherapy and radiation. New nodules that presented in 06/2016 over the right posterior leg were  biopsied and revealed the progression/persistence of Non-Hodgkin's lymphoma. She presents today for Port-A-Cath placement for future chemotherapy treatment. Risks and benefits discussed with the patient/husband including, but not limited to bleeding, infection, pneumothorax, or fibrin sheath development and need for additional procedures.All of the patient's questions were answered, patient is agreeable to proceed. Consent signed and in chart.    Electronically Signed: D. Rowe Robert 07/13/2016, 1:07 PM   I spent a total of 20 minutes at the the patient's bedside AND on the patient's hospital floor or unit, greater than 50% of which was counseling/coordinating care for port-a-cath placement.

## 2016-07-15 ENCOUNTER — Other Ambulatory Visit: Payer: Self-pay | Admitting: Oncology

## 2016-07-17 ENCOUNTER — Other Ambulatory Visit (HOSPITAL_BASED_OUTPATIENT_CLINIC_OR_DEPARTMENT_OTHER): Payer: Medicare Other

## 2016-07-17 ENCOUNTER — Encounter: Payer: Self-pay | Admitting: Oncology

## 2016-07-17 ENCOUNTER — Ambulatory Visit (HOSPITAL_BASED_OUTPATIENT_CLINIC_OR_DEPARTMENT_OTHER): Payer: Medicare Other | Admitting: Nurse Practitioner

## 2016-07-17 ENCOUNTER — Telehealth: Payer: Self-pay | Admitting: *Deleted

## 2016-07-17 ENCOUNTER — Ambulatory Visit (HOSPITAL_BASED_OUTPATIENT_CLINIC_OR_DEPARTMENT_OTHER): Payer: Medicare Other

## 2016-07-17 VITALS — BP 119/66 | HR 69 | Temp 97.5°F | Resp 16

## 2016-07-17 VITALS — BP 144/67 | HR 62 | Temp 98.4°F | Resp 18 | Ht 61.0 in | Wt 193.6 lb

## 2016-07-17 DIAGNOSIS — I10 Essential (primary) hypertension: Secondary | ICD-10-CM

## 2016-07-17 DIAGNOSIS — C8335 Diffuse large B-cell lymphoma, lymph nodes of inguinal region and lower limb: Secondary | ICD-10-CM

## 2016-07-17 DIAGNOSIS — E785 Hyperlipidemia, unspecified: Secondary | ICD-10-CM | POA: Diagnosis not present

## 2016-07-17 DIAGNOSIS — E039 Hypothyroidism, unspecified: Secondary | ICD-10-CM

## 2016-07-17 DIAGNOSIS — Z5111 Encounter for antineoplastic chemotherapy: Secondary | ICD-10-CM | POA: Diagnosis not present

## 2016-07-17 DIAGNOSIS — C8510 Unspecified B-cell lymphoma, unspecified site: Secondary | ICD-10-CM

## 2016-07-17 DIAGNOSIS — Z5112 Encounter for antineoplastic immunotherapy: Secondary | ICD-10-CM

## 2016-07-17 DIAGNOSIS — M1990 Unspecified osteoarthritis, unspecified site: Secondary | ICD-10-CM

## 2016-07-17 LAB — COMPREHENSIVE METABOLIC PANEL
ALT: 13 U/L (ref 0–55)
ANION GAP: 8 meq/L (ref 3–11)
AST: 20 U/L (ref 5–34)
Albumin: 3.6 g/dL (ref 3.5–5.0)
Alkaline Phosphatase: 99 U/L (ref 40–150)
BILIRUBIN TOTAL: 0.84 mg/dL (ref 0.20–1.20)
BUN: 24.3 mg/dL (ref 7.0–26.0)
CALCIUM: 9.6 mg/dL (ref 8.4–10.4)
CO2: 28 mEq/L (ref 22–29)
CREATININE: 0.7 mg/dL (ref 0.6–1.1)
Chloride: 105 mEq/L (ref 98–109)
EGFR: 81 mL/min/{1.73_m2} — ABNORMAL LOW (ref >90)
Glucose: 84 mg/dl (ref 70–140)
Potassium: 4.4 mEq/L (ref 3.5–5.1)
Sodium: 141 mEq/L (ref 136–145)
TOTAL PROTEIN: 6.5 g/dL (ref 6.4–8.3)

## 2016-07-17 LAB — CBC WITH DIFFERENTIAL/PLATELET
BASO%: 0.2 % (ref 0.0–2.0)
BASOS ABS: 0 10*3/uL (ref 0.0–0.1)
EOS ABS: 0.1 10*3/uL (ref 0.0–0.5)
EOS%: 1.9 % (ref 0.0–7.0)
HEMATOCRIT: 38.4 % (ref 34.8–46.6)
HGB: 12.8 g/dL (ref 11.6–15.9)
LYMPH%: 12.6 % — AB (ref 14.0–49.7)
MCH: 30 pg (ref 25.1–34.0)
MCHC: 33.3 g/dL (ref 31.5–36.0)
MCV: 89.9 fL (ref 79.5–101.0)
MONO#: 0.3 10*3/uL (ref 0.1–0.9)
MONO%: 5.6 % (ref 0.0–14.0)
NEUT%: 79.7 % — ABNORMAL HIGH (ref 38.4–76.8)
NEUTROS ABS: 4.7 10*3/uL (ref 1.5–6.5)
NRBC: 0 % (ref 0–0)
PLATELETS: 199 10*3/uL (ref 145–400)
RBC: 4.27 10*6/uL (ref 3.70–5.45)
RDW: 13.5 % (ref 11.2–14.5)
WBC: 5.9 10*3/uL (ref 3.9–10.3)
lymph#: 0.7 10*3/uL — ABNORMAL LOW (ref 0.9–3.3)

## 2016-07-17 LAB — LACTATE DEHYDROGENASE: LDH: 274 U/L — AB (ref 125–245)

## 2016-07-17 MED ORDER — PALONOSETRON HCL INJECTION 0.25 MG/5ML
0.2500 mg | Freq: Once | INTRAVENOUS | Status: AC
  Administered 2016-07-17: 0.25 mg via INTRAVENOUS

## 2016-07-17 MED ORDER — SODIUM CHLORIDE 0.9 % IV SOLN
750.0000 mg/m2 | Freq: Once | INTRAVENOUS | Status: AC
  Administered 2016-07-17: 1460 mg via INTRAVENOUS
  Filled 2016-07-17: qty 73

## 2016-07-17 MED ORDER — DEXAMETHASONE SODIUM PHOSPHATE 10 MG/ML IJ SOLN
10.0000 mg | Freq: Once | INTRAMUSCULAR | Status: AC
  Administered 2016-07-17: 10 mg via INTRAVENOUS

## 2016-07-17 MED ORDER — ACETAMINOPHEN 325 MG PO TABS
650.0000 mg | ORAL_TABLET | Freq: Once | ORAL | Status: AC
  Administered 2016-07-17: 650 mg via ORAL

## 2016-07-17 MED ORDER — SODIUM CHLORIDE 0.9% FLUSH
10.0000 mL | INTRAVENOUS | Status: DC | PRN
  Administered 2016-07-17: 10 mL
  Filled 2016-07-17: qty 10

## 2016-07-17 MED ORDER — ACETAMINOPHEN 325 MG PO TABS
ORAL_TABLET | ORAL | Status: AC
  Filled 2016-07-17: qty 2

## 2016-07-17 MED ORDER — VINCRISTINE SULFATE CHEMO INJECTION 1 MG/ML
2.0000 mg | Freq: Once | INTRAVENOUS | Status: AC
  Administered 2016-07-17: 2 mg via INTRAVENOUS
  Filled 2016-07-17: qty 2

## 2016-07-17 MED ORDER — HEPARIN SOD (PORK) LOCK FLUSH 100 UNIT/ML IV SOLN
500.0000 [IU] | Freq: Once | INTRAVENOUS | Status: AC | PRN
  Administered 2016-07-17: 500 [IU]
  Filled 2016-07-17: qty 5

## 2016-07-17 MED ORDER — DEXAMETHASONE SODIUM PHOSPHATE 10 MG/ML IJ SOLN
INTRAMUSCULAR | Status: AC
  Filled 2016-07-17: qty 1

## 2016-07-17 MED ORDER — DOXORUBICIN HCL CHEMO IV INJECTION 2 MG/ML
51.0000 mg/m2 | Freq: Once | INTRAVENOUS | Status: AC
  Administered 2016-07-17: 100 mg via INTRAVENOUS
  Filled 2016-07-17: qty 50

## 2016-07-17 MED ORDER — DIPHENHYDRAMINE HCL 25 MG PO CAPS
50.0000 mg | ORAL_CAPSULE | Freq: Once | ORAL | Status: AC
  Administered 2016-07-17: 50 mg via ORAL

## 2016-07-17 MED ORDER — LIDOCAINE-PRILOCAINE 2.5-2.5 % EX CREA
TOPICAL_CREAM | CUTANEOUS | Status: AC
  Filled 2016-07-17: qty 5

## 2016-07-17 MED ORDER — PREDNISONE 20 MG PO TABS: 80.0000 mg | ORAL_TABLET | Freq: Every day | ORAL | 0 refills | Status: AC

## 2016-07-17 MED ORDER — DIPHENHYDRAMINE HCL 25 MG PO CAPS
ORAL_CAPSULE | ORAL | Status: AC
  Filled 2016-07-17: qty 2

## 2016-07-17 MED ORDER — LIDOCAINE-PRILOCAINE 2.5-2.5 % EX CREA: 1.0000 "application " | TOPICAL_CREAM | CUTANEOUS | 1 refills | Status: DC | PRN

## 2016-07-17 MED ORDER — LACTATED RINGERS IV SOLN
511.0000 mg/m2 | Freq: Once | INTRAVENOUS | Status: AC
  Administered 2016-07-17: 1000 mg via INTRAVENOUS
  Filled 2016-07-17: qty 50

## 2016-07-17 MED ORDER — SODIUM CHLORIDE 0.9 % IV SOLN
375.0000 mg/m2 | Freq: Once | INTRAVENOUS | Status: AC
  Administered 2016-07-17: 700 mg via INTRAVENOUS
  Filled 2016-07-17: qty 50

## 2016-07-17 MED ORDER — SODIUM CHLORIDE 0.9 % IV SOLN
Freq: Once | INTRAVENOUS | Status: AC
  Administered 2016-07-17: 11:00:00 via INTRAVENOUS

## 2016-07-17 MED ORDER — PALONOSETRON HCL INJECTION 0.25 MG/5ML
INTRAVENOUS | Status: AC
  Filled 2016-07-17: qty 5

## 2016-07-17 NOTE — Progress Notes (Signed)
[facility] Key: [id number] - PA Case ID: [id number] - Rx #: [id number] Need help? Call us at [phone]  Outcome  Approvedtoday  PT:[id number] Name:ST: Immunosuppressant (e.g.,Astagraf XL, azathioprine, CellCept, cyclosporine, Gengraf, Imuran, mycophenolate, Myfortic, Neoral, Nulojix, Orthoclone, Prograf, Rapamune, sirolimus, tacrolimus, Zortress, & others (B vs D) (PA type 3) - [place] MEDICARE - ESI;Status:Approved;Coverage Start Date:06/17/2016;Coverage End Date:07/17/2019;  DrugPredniSONE 20MG  tablets  FormExpress Scripts Electronic PA Form  Original Claim [phone] CALL HELP DESK

## 2016-07-17 NOTE — Progress Notes (Signed)
  [place] OFFICE PROGRESS NOTE   Diagnosis:  Non-Hodgkin's lymphoma  INTERVAL HISTORY:   Kelly Ray returns as scheduled. She overall feels well. She remains very active. No fevers or sweats. Good appetite. She has noted some pain associated with the right lower leg lesions.  Objective:  Vital signs in last 24 hours:  Blood pressure (!) 144/67, pulse 62, temperature 98.4 F (36.9 C), temperature source Oral, resp. rate 18, height '5\' 1"'$  (1.549 m), weight 193 lb 9.6 oz (87.8 kg), SpO2 98 %.    HEENT: No thrush or ulcers. Resp: Lungs clear bilaterally. Cardio: Regular rate and rhythm. GI: Abdomen soft and nontender. No organomegaly. Vascular: No leg edema.  Skin: Large cutaneous lesion right posterior calf; several smaller lesions right posterior calf.  Port-A-Cath without erythema.  Lab Results:  Lab Results  Component Value Date   WBC 5.9 07/17/2016   HGB 12.8 07/17/2016   HCT 38.4 07/17/2016   MCV 89.9 07/17/2016   PLT 199 07/17/2016   NEUTROABS 4.7 07/17/2016    Imaging:  No results found.  Medications: I have reviewed the patient's current medications.  Assessment/Plan: 1. 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 Negative restaging CT evaluation 10/24/2012  Nodular skin lesions at the right lower leg, status post a shave biopsy 07/15/2013 confirming a malignant B-cell lymphoma, diffuse large cell positive for CD20, BCL 6, BCL 2, and CD10   Staging bone marrow biopsy 07/31/2013, negative for involvement with lymphoma   Staging PET scan 07/30/2013-negative.   Status post palliative radiation right lower leg nodular skin lesions 08/17/2013 through 09/03/2013.  New nodular skin lesions at the right lower leg April 2017, status postpsies of lesions at the right lower leg 12/07/2015 confirming large B-cell lymphoma, CD20 positive  bio  staging PET scan 12/26/2015-hypermetabolic cutaneous and subcutaneous nodules in the right lower extremity, no other evidence of lymphoma  Cycle 1 bendamustine/Rituxan 01/03/2016  Cycle 2 bendamustine/Rituxan 01/31/2016  Clinical progression with enlargement of multiple right lower leg skin lesions 02/23/2016  Status post radiation right lower leg 03/14/2016 through 03/29/2016  Clinical progression with new and enlarging skin lesions right lower leg 07/06/2016  PET scan 07/12/2016-clear interval increase in size and metabolic activity of a large nodule posterior right calf and a smaller subcutaneous nodule medial right thigh; several more superficial right lower extremity lesions improved with reduction in size and metabolic activity; single focus of metabolic activity associated with the medial right clavicle with bony change  Cycle 1 CHOP/Rituxan 07/17/2016 2. Port-A-Cath placement 06/01/2011. Port-A-Cath removal 11/04/2012 3. Hypertension. 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 5. Hypercholesterolemia. 6. Osteoarthritis. 7. Malaise-? Secondary to progression of non-Hodgkin's lymphoma. Improved. 8. Staph infection right lower leg February 2015. 9. Right calf lesion with purulent drainage 02/09/2016, culture obtained-group B strep, doxycycline prescribed 10. Port-A-Cath placement 07/13/2016 11. 2-D echo 07/10/2016-estimated ejection fraction range of 55-60%   Disposition: Ms. Kusch appears stable. Plan to proceed with cycle 1 CHOP/Rituxan today as scheduled. Potential toxicities again reviewed. Questions answered. We will see her in follow-up on 07/27/2016 with a CBC. She will contact the office in the interim with any problems.  Plan reviewed with Dr. Benay Spice.    Ned Card ANP/GNP-BC   07/17/2016  9:23 AM

## 2016-07-17 NOTE — Progress Notes (Signed)
Submitted PA through Cover [employer] Meds for prednisone.  Kelly Ray (Key: [id number])   This request has been approved. Please note any additional information provided by [employer] at the bottom of your screen   Called [phone] to provide this information and spoke with Lauren.

## 2016-07-17 NOTE — Patient Instructions (Addendum)
[facility] Discharge Instructions for Patients Receiving Chemotherapy  Today you received the following chemotherapy agents Zinecard, Adriamycin, Vincristine, Cytoxan and Rituxan   To help prevent nausea and vomiting after your treatment, we encourage you to take your nausea medication as directed. No Zofran for 3 days.    If you develop nausea and vomiting that is not controlled by your nausea medication, call the clinic.   BELOW ARE SYMPTOMS THAT SHOULD BE REPORTED IMMEDIATELY:  *FEVER GREATER THAN 100.5 F  *CHILLS WITH OR WITHOUT FEVER  NAUSEA AND VOMITING THAT IS NOT CONTROLLED WITH YOUR NAUSEA MEDICATION  *UNUSUAL SHORTNESS OF BREATH  *UNUSUAL BRUISING OR BLEEDING  TENDERNESS IN MOUTH AND THROAT WITH OR WITHOUT PRESENCE OF ULCERS  *URINARY PROBLEMS  *BOWEL PROBLEMS  UNUSUAL RASH Items with * indicate a potential emergency and should be followed up as soon as possible.  Feel free to call the clinic you have any questions or concerns. The clinic phone number is [phone].  Please show the Jay at check-in to the Emergency Department and triage nurse.  Doxorubicin injection What is this medicine? DOXORUBICIN (dox oh ROO bi sin) is a chemotherapy drug. It is used to treat many kinds of cancer like leukemia, lymphoma, neuroblastoma, sarcoma, and Wilms' tumor. It is also used to treat bladder cancer, breast cancer, lung cancer, ovarian cancer, stomach cancer, and thyroid cancer. This medicine may be used for other purposes; ask your health care provider or pharmacist if you have questions. COMMON BRAND NAME(S): Adriamycin, Adriamycin PFS, Adriamycin RDF, Rubex What should I tell my health care provider before I take this medicine? They need to know if you have any of these conditions: -heart disease -history of low blood counts caused by a medicine -liver disease -recent or ongoing radiation therapy -an unusual or allergic reaction to  doxorubicin, other chemotherapy agents, other medicines, foods, dyes, or preservatives -pregnant or trying to get pregnant -breast-feeding How should I use this medicine? This drug is given as an infusion into a vein. It is administered in a hospital or clinic by a specially trained health care professional. If you have pain, swelling, burning or any unusual feeling around the site of your injection, tell your health care professional right away. Talk to your pediatrician regarding the use of this medicine in children. Special care may be needed. Overdosage: If you think you have taken too much of this medicine contact a poison control center or emergency room at once. NOTE: This medicine is only for you. Do not share this medicine with others. What if I miss a dose? It is important not to miss your dose. Call your doctor or health care professional if you are unable to keep an appointment. What may interact with this medicine? This medicine may interact with the following medications: -6-mercaptopurine -paclitaxel -phenytoin -St. John's Wort -trastuzumab -verapamil This list may not describe all possible interactions. Give your health care provider a list of all the medicines, herbs, non-prescription drugs, or dietary supplements you use. Also tell them if you smoke, drink alcohol, or use illegal drugs. Some items may interact with your medicine. What should I watch for while using this medicine? This drug may make you feel generally unwell. This is not uncommon, as chemotherapy can affect healthy cells as well as cancer cells. Report any side effects. Continue your course of treatment even though you feel ill unless your doctor tells you to stop. There is a maximum amount of this medicine you  should receive throughout your life. The amount depends on the medical condition being treated and your overall health. Your doctor will watch how much of this medicine you receive in your lifetime. Tell  your doctor if you have taken this medicine before. You may need blood work done while you are taking this medicine. Your urine may turn red for a few days after your dose. This is not blood. If your urine is dark or brown, call your doctor. In some cases, you may be given additional medicines to help with side effects. Follow all directions for their use. Call your doctor or health care professional for advice if you get a fever, chills or sore throat, or other symptoms of a cold or flu. Do not treat yourself. This drug decreases your body's ability to fight infections. Try to avoid being around people who are sick. This medicine may increase your risk to bruise or bleed. Call your doctor or health care professional if you notice any unusual bleeding. Talk to your doctor about your risk of cancer. You may be more at risk for certain types of cancers if you take this medicine. Do not become pregnant while taking this medicine or for 6 months after stopping it. Women should inform their doctor if they wish to become pregnant or think they might be pregnant. Men should not father a child while taking this medicine and for 6 months after stopping it. There is a potential for serious side effects to an unborn child. Talk to your health care professional or pharmacist for more information. Do not breast-feed an infant while taking this medicine. This medicine has caused ovarian failure in some women and reduced sperm counts in some men This medicine may interfere with the ability to have a child. Talk with your doctor or health care professional if you are concerned about your fertility. What side effects may I notice from receiving this medicine? Side effects that you should report to your doctor or health care professional as soon as possible: -allergic reactions like skin rash, itching or hives, swelling of the face, lips, or tongue -breathing problems -chest pain -fast or irregular heartbeat -low blood  counts - this medicine may decrease the number of white blood cells, red blood cells and platelets. You may be at increased risk for infections and bleeding. -pain, redness, or irritation at site where injected -signs of infection - fever or chills, cough, sore throat, pain or difficulty passing urine -signs of decreased platelets or bleeding - bruising, pinpoint red spots on the skin, black, tarry stools, blood in the urine -swelling of the ankles, feet, hands -tiredness -weakness Side effects that usually do not require medical attention (report to your doctor or health care professional if they continue or are bothersome): -diarrhea -hair loss -mouth sores -nail discoloration or damage -nausea -red colored urine -vomiting This list may not describe all possible side effects. Call your doctor for medical advice about side effects. You may report side effects to FDA at 1-800-FDA-1088. Where should I keep my medicine? This drug is given in a hospital or clinic and will not be stored at home. NOTE: This sheet is a summary. It may not cover all possible information. If you have questions about this medicine, talk to your doctor, pharmacist, or health care provider.  2017 Elsevier/Gold Standard (2015-10-10 11:28:51)  Dexrazoxane injection What is this medicine? DEXRAZOXANE (dex ray ZOX ane) is used to protect against damage caused by certain chemotherapy. This medicine may be used  for other purposes; ask your health care provider or pharmacist if you have questions. COMMON BRAND NAME(S): Zinecard What should I tell my health care provider before I take this medicine? They need to know if you have any of these conditions: -bone marrow suppression -heart disease -kidney disease -an unusual or allergic reaction to dexrazoxane, other medicines, foods, dyes, or preservatives -pregnant or trying to get pregnant -breast-feeding How should I use this medicine? This medicine is for injection  or infusion into a vein. It is given by a health care professional in a hospital or clinic setting. This medicine is given just before you are given your chemotherapy medicine. Talk to your pediatrician regarding the use of this medicine in children. Special care may be needed. Overdosage: If you think you have taken too much of this medicine contact a poison control center or emergency room at once. NOTE: This medicine is only for you. Do not share this medicine with others. What if I miss a dose? This does not apply. What may interact with this medicine? Interactions are not expected. This list may not describe all possible interactions. Give your health care provider a list of all the medicines, herbs, non-prescription drugs, or dietary supplements you use. Also tell them if you smoke, drink alcohol, or use illegal drugs. Some items may interact with your medicine. What should I watch for while using this medicine? Your condition will be monitored carefully while you are receiving this medicine. This medicine may increase your risk of getting an infection. Stay away from people who are sick and anyone who has recently had a vaccine. Call your doctor or health care professional for advice if you get a fever, chills, or sore throat. Do not treat yourself. This medicine may increase your risk to bruise or bleed. Call your doctor or health care professional if you notice any unusual bleeding. What side effects may I notice from receiving this medicine? Side effects that you should report to your doctor or health care professional as soon as possible: -allergic reactions like skin rash, itching or hives, swelling of the face, lips, or tongue -fever, chills, or sore throat -mouth sores -pain at site where injected -unusual bleeding or bruising -unusually tired or weak -vomiting Side effects that usually do not require medical attention (report to your doctor or health care professional if they  continue or are bothersome): -confusion -depression -diarrhea -hair loss -nausea This list may not describe all possible side effects. Call your doctor for medical advice about side effects. You may report side effects to FDA at 1-800-FDA-1088. Where should I keep my medicine? This drug is given in a hospital or clinic and will not be stored at home. NOTE: This sheet is a summary. It may not cover all possible information. If you have questions about this medicine, talk to your doctor, pharmacist, or health care provider.  2017 Elsevier/Gold Standard (2007-12-04 17:38:56)  Vincristine injection What is this medicine? VINCRISTINE (vin KRIS teen) is a chemotherapy drug. It slows the growth of cancer cells. This medicine is used to treat many types of cancer like Hodgkin's disease, leukemia, non-Hodgkin's lymphoma, neuroblastoma (brain cancer), rhabdomyosarcoma, and Wilms' tumor. This medicine may be used for other purposes; ask your health care provider or pharmacist if you have questions. COMMON BRAND NAME(S): Oncovin, Vincasar PFS What should I tell my health care provider before I take this medicine? They need to know if you have any of these conditions: -blood disorders -gout -infection (especially chickenpox, cold  sores, or herpes) -kidney disease -liver disease -lung disease -nervous system disease like Charcot-Marie-Tooth (CMT) -recent or ongoing radiation therapy -an unusual or allergic reaction to vincristine, other chemotherapy agents, other medicines, foods, dyes, or preservatives -pregnant or trying to get pregnant -breast-feeding How should I use this medicine? This drug is given as an infusion into a vein. It is administered in a hospital or clinic by a specially trained health care professional. If you have pain, swelling, burning, or any unusual feeling around the site of your injection, tell your health care professional right away. Talk to your pediatrician regarding  the use of this medicine in children. While this drug may be prescribed for selected conditions, precautions do apply. Overdosage: If you think you have taken too much of this medicine contact a poison control center or emergency room at once. NOTE: This medicine is only for you. Do not share this medicine with others. What if I miss a dose? It is important not to miss your dose. Call your doctor or health care professional if you are unable to keep an appointment. What may interact with this medicine? Do not take this medicine with any of the following medications: -itraconazole -mibefradil -voriconazole This medicine may also interact with the following medications: -cyclosporine -erythromycin -fluconazole -ketoconazole -medicines for HIV like delavirdine, efavirenz, nevirapine -medicines for seizures like ethotoin, fosphenotoin, phenytoin -medicines to increase blood counts like filgrastim, pegfilgrastim, sargramostim -other chemotherapy drugs like cisplatin, L-asparaginase, methotrexate, mitomycin, paclitaxel -pegaspargase -vaccines -zalcitabine, ddC Talk to your doctor or health care professional before taking any of these medicines: -acetaminophen -aspirin -ibuprofen -ketoprofen -naproxen This list may not describe all possible interactions. Give your health care provider a list of all the medicines, herbs, non-prescription drugs, or dietary supplements you use. Also tell them if you smoke, drink alcohol, or use illegal drugs. Some items may interact with your medicine. What should I watch for while using this medicine? Your condition will be monitored carefully while you are receiving this medicine. You will need important blood work done while you are taking this medicine. This drug may make you feel generally unwell. This is not uncommon, as chemotherapy can affect healthy cells as well as cancer cells. Report any side effects. Continue your course of treatment even though you  feel ill unless your doctor tells you to stop. In some cases, you may be given additional medicines to help with side effects. Follow all directions for their use. Call your doctor or health care professional for advice if you get a fever, chills or sore throat, or other symptoms of a cold or flu. Do not treat yourself. Avoid taking products that contain aspirin, acetaminophen, ibuprofen, naproxen, or ketoprofen unless instructed by your doctor. These medicines may hide a fever. Do not become pregnant while taking this medicine. Women should inform their doctor if they wish to become pregnant or think they might be pregnant. There is a potential for serious side effects to an unborn child. Talk to your health care professional or pharmacist for more information. Do not breast-feed an infant while taking this medicine. Men may have a lower sperm count while taking this medicine. Talk to your doctor if you plan to father a child. What side effects may I notice from receiving this medicine? Side effects that you should report to your doctor or health care professional as soon as possible: -allergic reactions like skin rash, itching or hives, swelling of the face, lips, or tongue -breathing problems -confusion or changes in emotions  or moods -constipation -cough -mouth sores -muscle weakness -nausea and vomiting -pain, swelling, redness or irritation at the injection site -pain, tingling, numbness in the hands or feet -problems with balance, talking, walking -seizures -stomach pain -trouble passing urine or change in the amount of urine Side effects that usually do not require medical attention (report to your doctor or health care professional if they continue or are bothersome): -diarrhea -hair loss -jaw pain -loss of appetite This list may not describe all possible side effects. Call your doctor for medical advice about side effects. You may report side effects to FDA at  1-800-FDA-1088. Where should I keep my medicine? This drug is given in a hospital or clinic and will not be stored at home. NOTE: This sheet is a summary. It may not cover all possible information. If you have questions about this medicine, talk to your doctor, pharmacist, or health care provider.  2017 Elsevier/Gold Standard (2008-05-10 17:17:13)  Cyclophosphamide injection What is this medicine? CYCLOPHOSPHAMIDE (sye kloe FOSS fa mide) is a chemotherapy drug. It slows the growth of cancer cells. This medicine is used to treat many types of cancer like lymphoma, myeloma, leukemia, breast cancer, and ovarian cancer, to name a few. This medicine may be used for other purposes; ask your health care provider or pharmacist if you have questions. COMMON BRAND NAME(S): Cytoxan, Neosar What should I tell my health care provider before I take this medicine? They need to know if you have any of these conditions: -blood disorders -history of other chemotherapy -infection -kidney disease -liver disease -recent or ongoing radiation therapy -tumors in the bone marrow -an unusual or allergic reaction to cyclophosphamide, other chemotherapy, other medicines, foods, dyes, or preservatives -pregnant or trying to get pregnant -breast-feeding How should I use this medicine? This drug is usually given as an injection into a vein or muscle or by infusion into a vein. It is administered in a hospital or clinic by a specially trained health care professional. Talk to your pediatrician regarding the use of this medicine in children. Special care may be needed. Overdosage: If you think you have taken too much of this medicine contact a poison control center or emergency room at once. NOTE: This medicine is only for you. Do not share this medicine with others. What if I miss a dose? It is important not to miss your dose. Call your doctor or health care professional if you are unable to keep an appointment. What  may interact with this medicine? This medicine may interact with the following medications: -amiodarone -amphotericin B -azathioprine -certain antiviral medicines for HIV or AIDS such as protease inhibitors (e.g., indinavir, ritonavir) and zidovudine -certain blood pressure medications such as benazepril, captopril, enalapril, fosinopril, lisinopril, moexipril, monopril, perindopril, quinapril, ramipril, trandolapril -certain cancer medications such as anthracyclines (e.g., daunorubicin, doxorubicin), busulfan, cytarabine, paclitaxel, pentostatin, tamoxifen, trastuzumab -certain diuretics such as chlorothiazide, chlorthalidone, hydrochlorothiazide, indapamide, metolazone -certain medicines that treat or prevent blood clots like warfarin -certain muscle relaxants such as succinylcholine -cyclosporine -etanercept -indomethacin -medicines to increase blood counts like filgrastim, pegfilgrastim, sargramostim -medicines used as general anesthesia -metronidazole -natalizumab This list may not describe all possible interactions. Give your health care provider a list of all the medicines, herbs, non-prescription drugs, or dietary supplements you use. Also tell them if you smoke, drink alcohol, or use illegal drugs. Some items may interact with your medicine. What should I watch for while using this medicine? Visit your doctor for checks on your progress. This drug may make  you feel generally unwell. This is not uncommon, as chemotherapy can affect healthy cells as well as cancer cells. Report any side effects. Continue your course of treatment even though you feel ill unless your doctor tells you to stop. Drink water or other fluids as directed. Urinate often, even at night. In some cases, you may be given additional medicines to help with side effects. Follow all directions for their use. Call your doctor or health care professional for advice if you get a fever, chills or sore throat, or other  symptoms of a cold or flu. Do not treat yourself. This drug decreases your body's ability to fight infections. Try to avoid being around people who are sick. This medicine may increase your risk to bruise or bleed. Call your doctor or health care professional if you notice any unusual bleeding. Be careful brushing and flossing your teeth or using a toothpick because you may get an infection or bleed more easily. If you have any dental work done, tell your dentist you are receiving this medicine. You may get drowsy or dizzy. Do not drive, use machinery, or do anything that needs mental alertness until you know how this medicine affects you. Do not become pregnant while taking this medicine or for 1 year after stopping it. Women should inform their doctor if they wish to become pregnant or think they might be pregnant. Men should not father a child while taking this medicine and for 4 months after stopping it. There is a potential for serious side effects to an unborn child. Talk to your health care professional or pharmacist for more information. Do not breast-feed an infant while taking this medicine. This medicine may interfere with the ability to have a child. This medicine has caused ovarian failure in some women. This medicine has caused reduced sperm counts in some men. You should talk with your doctor or health care professional if you are concerned about your fertility. If you are going to have surgery, tell your doctor or health care professional that you have taken this medicine. What side effects may I notice from receiving this medicine? Side effects that you should report to your doctor or health care professional as soon as possible: -allergic reactions like skin rash, itching or hives, swelling of the face, lips, or tongue -low blood counts - this medicine may decrease the number of white blood cells, red blood cells and platelets. You may be at increased risk for infections and  bleeding. -signs of infection - fever or chills, cough, sore throat, pain or difficulty passing urine -signs of decreased platelets or bleeding - bruising, pinpoint red spots on the skin, black, tarry stools, blood in the urine -signs of decreased red blood cells - unusually weak or tired, fainting spells, lightheadedness -breathing problems -dark urine -dizziness -palpitations -swelling of the ankles, feet, hands -trouble passing urine or change in the amount of urine -weight gain -yellowing of the eyes or skin Side effects that usually do not require medical attention (report to your doctor or health care professional if they continue or are bothersome): -changes in nail or skin color -hair loss -missed menstrual periods -mouth sores -nausea, vomiting This list may not describe all possible side effects. Call your doctor for medical advice about side effects. You may report side effects to FDA at 1-800-FDA-1088. Where should I keep my medicine? This drug is given in a hospital or clinic and will not be stored at home. NOTE: This sheet is a summary. It  may not cover all possible information. If you have questions about this medicine, talk to your doctor, pharmacist, or health care provider.  2017 Elsevier/Gold Standard (2012-06-27 16:22:58)  Rituximab injection What is this medicine? RITUXIMAB (ri TUX i mab) is a monoclonal antibody. It is used to treat non-Hodgkin lymphoma and chronic lymphocytic leukemia. It is also used to treat rheumatoid arthritis (RA). In RA, this medicine slows the inflammatory process and help reduce joint pain and swelling. This medicine is often used with other cancer or arthritis medications. This medicine may be used for other purposes; ask your health care provider or pharmacist if you have questions. COMMON BRAND NAME(S): Rituxan What should I tell my health care provider before I take this medicine? They need to know if you have any of these  conditions: -heart disease -infection (especially a virus infection such as hepatitis B, chickenpox, cold sores, or herpes) -immune system problems -irregular heartbeat -kidney disease -lung or breathing disease, like asthma -recently received or scheduled to receive a vaccine -an unusual or allergic reaction to rituximab, mouse proteins, other medicines, foods, dyes, or preservatives -pregnant or trying to get pregnant -breast-feeding How should I use this medicine? This medicine is for infusion into a vein. It is administered in a hospital or clinic by a specially trained health care professional. A special MedGuide will be given to you by the pharmacist with each prescription and refill. Be sure to read this information carefully each time. Talk to your pediatrician regarding the use of this medicine in children. This medicine is not approved for use in children. Overdosage: If you think you have taken too much of this medicine contact a poison control center or emergency room at once. NOTE: This medicine is only for you. Do not share this medicine with others. What if I miss a dose? It is important not to miss a dose. Call your doctor or health care professional if you are unable to keep an appointment. What may interact with this medicine? -cisplatin -medicines for blood pressure -some other medicines for arthritis -vaccines This list may not describe all possible interactions. Give your health care provider a list of all the medicines, herbs, non-prescription drugs, or dietary supplements you use. Also tell them if you smoke, drink alcohol, or use illegal drugs. Some items may interact with your medicine. What should I watch for while using this medicine? Report any side effects that you notice during your treatment right away, such as changes in your breathing, fever, chills, dizziness or lightheadedness. These effects are more common with the first dose. Visit your prescriber or  health care professional for checks on your progress. You will need to have regular blood work. Report any other side effects. The side effects of this medicine can continue after you finish your treatment. Continue your course of treatment even though you feel ill unless your doctor tells you to stop. Call your doctor or health care professional for advice if you get a fever, chills or sore throat, or other symptoms of a cold or flu. Do not treat yourself. This drug decreases your body's ability to fight infections. Try to avoid being around people who are sick. This medicine may increase your risk to bruise or bleed. Call your doctor or health care professional if you notice any unusual bleeding. Be careful brushing and flossing your teeth or using a toothpick because you may get an infection or bleed more easily. If you have any dental work done, tell your dentist you  are receiving this medicine. Avoid taking products that contain aspirin, acetaminophen, ibuprofen, naproxen, or ketoprofen unless instructed by your doctor. These medicines may hide a fever. Do not become pregnant while taking this medicine. Women should inform their doctor if they wish to become pregnant or think they might be pregnant. There is a potential for serious side effects to an unborn child. Talk to your health care professional or pharmacist for more information. Do not breast-feed an infant while taking this medicine. What side effects may I notice from receiving this medicine? Side effects that you should report to your doctor or health care professional as soon as possible: -allergic reactions like skin rash, itching or hives, swelling of the face, lips, or tongue -low blood counts - this medicine may decrease the number of white blood cells, red blood cells and platelets. You may be at increased risk for infections and bleeding. -signs of infection - fever or chills, cough, sore throat, pain or difficulty passing  urine -signs of decreased platelets or bleeding - bruising, pinpoint red spots on the skin, black, tarry stools, blood in the urine -signs of decreased red blood cells - unusually weak or tired, fainting spells, lightheadedness -breathing problems -confused, not responsive -chest pain -fast, irregular heartbeat -feeling faint or lightheaded, falls -mouth sores -redness, blistering, peeling or loosening of the skin, including inside the mouth -stomach pain -swelling of the ankles, feet, or hands -trouble passing urine or change in the amount of urine Side effects that usually do not require medical attention (report to your doctor or health care professional if they continue or are bothersome): -anxiety -headache -loss of appetite -muscle aches -nausea -night sweats This list may not describe all possible side effects. Call your doctor for medical advice about side effects. You may report side effects to FDA at 1-800-FDA-1088. Where should I keep my medicine? This drug is given in a hospital or clinic and will not be stored at home. NOTE: This sheet is a summary. It may not cover all possible information. If you have questions about this medicine, talk to your doctor, pharmacist, or health care provider.  2017 [employer]vier/Gold Standard (2016-02-23 17:23:26)

## 2016-07-17 NOTE — Progress Notes (Signed)
No Neulasta with this cycle of treatment per Ned Card, NP

## 2016-07-17 NOTE — Addendum Note (Signed)
Addended by: Ned Card K on: 07/17/2016 11:08 AM   Modules accepted: Orders

## 2016-07-17 NOTE — Telephone Encounter (Signed)
Per LOS I have scheduled appts and notified the scheduler 

## 2016-07-18 ENCOUNTER — Telehealth: Payer: Self-pay

## 2016-07-18 ENCOUNTER — Other Ambulatory Visit: Payer: Self-pay | Admitting: *Deleted

## 2016-07-18 LAB — HEPATITIS B SURFACE ANTIGEN: HEP B S AG: NEGATIVE

## 2016-07-18 LAB — HEPATITIS B CORE ANTIBODY, TOTAL: Hep B Core Ab, Tot: POSITIVE — AB

## 2016-07-18 MED ORDER — PROCHLORPERAZINE MALEATE 10 MG PO TABS: 10.0000 mg | ORAL_TABLET | Freq: Four times a day (QID) | ORAL | 0 refills | Status: DC | PRN

## 2016-07-18 NOTE — Telephone Encounter (Signed)
Prescription for Compazine sent [facility] per order of Dr. Benay Spice.

## 2016-07-18 NOTE — Telephone Encounter (Signed)
Pt called stating Ned Card NP was going to prescribe nausea medication and did not. [facility] to [facility],

## 2016-07-23 ENCOUNTER — Encounter: Payer: Self-pay | Admitting: *Deleted

## 2016-07-27 ENCOUNTER — Telehealth: Payer: Self-pay | Admitting: Oncology

## 2016-07-27 ENCOUNTER — Ambulatory Visit (HOSPITAL_BASED_OUTPATIENT_CLINIC_OR_DEPARTMENT_OTHER): Payer: Medicare Other | Admitting: Nurse Practitioner

## 2016-07-27 ENCOUNTER — Other Ambulatory Visit (HOSPITAL_BASED_OUTPATIENT_CLINIC_OR_DEPARTMENT_OTHER): Payer: Medicare Other

## 2016-07-27 VITALS — BP 112/70 | HR 71 | Temp 98.0°F | Resp 18 | Ht 61.0 in | Wt 191.7 lb

## 2016-07-27 DIAGNOSIS — D696 Thrombocytopenia, unspecified: Secondary | ICD-10-CM

## 2016-07-27 DIAGNOSIS — C8335 Diffuse large B-cell lymphoma, lymph nodes of inguinal region and lower limb: Secondary | ICD-10-CM | POA: Diagnosis not present

## 2016-07-27 DIAGNOSIS — R079 Chest pain, unspecified: Secondary | ICD-10-CM | POA: Diagnosis not present

## 2016-07-27 LAB — CBC WITH DIFFERENTIAL/PLATELET
HCT: 35.2 % (ref 34.8–46.6)
HEMOGLOBIN: 11.8 g/dL (ref 11.6–15.9)
MCH: 29.9 pg (ref 25.1–34.0)
MCHC: 33.5 g/dL (ref 31.5–36.0)
MCV: 89.1 fL (ref 79.5–101.0)
PLATELETS: 98 10*3/uL — AB (ref 145–400)
RBC: 3.95 10*6/uL (ref 3.70–5.45)
RDW: 12.8 % (ref 11.2–14.5)
WBC: 0.3 10*3/uL — AB (ref 3.9–10.3)

## 2016-07-27 LAB — MANUAL DIFFERENTIAL
ALC: 0.2 10*3/uL — AB (ref 0.9–3.3)
ANC (CHCC manual diff): 0 10*3/uL — CL (ref 1.5–6.5)
Basophil: 4 % — ABNORMAL HIGH (ref 0–2)
EOS%: 2 % (ref 0–7)
LYMPH: 86 % — ABNORMAL HIGH (ref 14–49)
MONO: 6 % (ref 0–14)
PLT EST: DECREASED
RBC Comments: NORMAL
SEG: 2 % — AB (ref 38–77)

## 2016-07-27 MED ORDER — CIPROFLOXACIN HCL 500 MG PO TABS: 500.0000 mg | ORAL_TABLET | Freq: Two times a day (BID) | ORAL | 0 refills | Status: DC

## 2016-07-27 NOTE — Progress Notes (Addendum)
[facility] OFFICE PROGRESS NOTE   Diagnosis:  Non-Hodgkin's lymphoma  INTERVAL HISTORY:   Ms. Rainford returns as scheduled. She completed cycle 1 CHOP/Rituxan 07/17/2016. She denies nausea/vomiting. No mouth sores. She developed constipation following the chemotherapy. Bowels now moving. She notes that her throat is "sore and dry" intermittently. She has periodic chest pain which mainly occurs after eating. Overall she is able to eat and drink without significant difficulty. Main complaints are fatigue and difficulty sleeping. Skin lesions are smaller.  Objective:  Vital signs in last 24 hours:  Blood pressure (!) 114/40, pulse 71, temperature 98 F (36.7 C), temperature source Oral, resp. rate 18, height 5\' 1"  (1.549 m), weight 191 lb 11.2 oz (87 kg), SpO2 100 %.    HEENT: No thrush or ulcers. Resp: Lungs clear bilaterally. Cardio: Regular rate and rhythm. GI: Abdomen soft and nontender. No organomegaly. Vascular: No leg edema.  Skin: Approximate 2 cm cutaneous right calf lesion.  Port-A-Cath without erythema.  Lab Results:  Lab Results  Component Value Date   WBC 5.9 07/17/2016   HGB 12.8 07/17/2016   HCT 38.4 07/17/2016   MCV 89.9 07/17/2016   PLT 199 07/17/2016   NEUTROABS 4.7 07/17/2016    Imaging:  No results found.  Medications: I have reviewed the patient's current medications.  Assessment/Plan: 1. 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 Negative restaging CT evaluation 10/24/2012  Nodular skin lesions at the right lower leg, status post a shave biopsy 07/15/2013 confirming a malignant B-cell lymphoma, diffuse large cell positive for CD20, BCL 6, BCL 2, and CD10   Staging bone marrow biopsy 07/31/2013, negative for involvement with lymphoma   Staging PET scan 07/30/2013-negative.   Status post palliative radiation right lower leg  nodular skin lesions 08/17/2013 through 09/03/2013.  New nodular skin lesions at the right lower leg April 2017, status postpsies of lesions at the right lower leg 12/07/2015 confirming large B-cell lymphoma, CD20 positive bio  staging PET scan 12/26/2015-hypermetabolic cutaneous and subcutaneous nodules in the right lower extremity, no other evidence of lymphoma  Cycle 1 bendamustine/Rituxan 01/03/2016  Cycle 2 bendamustine/Rituxan 01/31/2016  Clinical progression with enlargement of multiple right lower leg skin lesions 02/23/2016  Status post radiation right lower leg 03/14/2016 through 03/29/2016  Clinical progression with new and enlarging skin lesions right lower leg 07/06/2016  PET scan 07/12/2016-clear interval increase in size and metabolic activity of a large nodule posterior right calf and a smaller subcutaneous nodule medial right thigh; several more superficial right lower extremity lesions improved with reduction in size and metabolic activity; single focus of metabolic activity associated with the medial right clavicle with bony change  Cycle 1 CHOP/Rituxan 07/17/2016 2. Port-A-Cath placement 06/01/2011. Port-A-Cath removal 11/04/2012 3. Hypertension. 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 5. Hypercholesterolemia. 6. Osteoarthritis. 7. Malaise-? Secondary to progression of non-Hodgkin's lymphoma. Improved. 8. Staph infection right lower leg February 2015. 9. Right calf lesion with purulent drainage 02/09/2016, culture obtained-group B strep, doxycycline prescribed 10. Port-A-Cath placement 07/13/2016 11. 2-D echo 07/10/2016-estimated ejection fraction range of 55-60% 12. 07/17/2016 hepatitis B surface antigen negative, core antibody positive 13. Neutropenia secondary to chemotherapy 07/27/2016 14. Thrombocytopenia secondary to chemotherapy 07/27/2016   Disposition: Ms. Thielke appears stable. She completed cycle 1 CHOP/Rituxan 07/17/2016. The skin lesions are smaller.  She  did not receive Neulasta with cycle 1 due to poor tolerance in the past. She has severe neutropenia on labs today.  She will begin ciprofloxacin 500 mg twice daily. Neutropenic precautions were reviewed. She understands to contact the office with fever, chills, other signs of infection. She will receive Neulasta with cycle 2. She is in agreement with this plan.   She also has mild thrombocytopenia and understands to contact the office with any bleeding.   The intermittent throat and chest pain may be mucositis related to chemotherapy. Symptoms may also be related to reflux. She will begin an antacid and try Chloraseptic throat spray. She is able to eat and drink with minimal difficulty.  She will try Benadryl for the sleep disturbance.  She will return for a follow-up CBC on 07/31/2016. She will return for a follow-up visit and cycle 2 CHOP/Rituxan as scheduled on 08/07/2016.  Patient seen with Dr. Benay Spice. 25 minutes were spent face-to-face at today's visit with the majority of that time involved in counseling/coordination of care.  Ned Card ANP/GNP-BC   07/27/2016  12:07 PM  This was a shared visit with Ned Card. Ms. Dail was interviewed and examined. The right lower leg cutaneous nodules have responded to the R CHOP. She will contact us for a fever or symptoms of an infection.  Julieanne Manson, M.D.

## 2016-07-27 NOTE — Telephone Encounter (Signed)
Appointments scheduled per 07/27/16 los. A copy of the AVS report and appointment schedule was given to patient,per 07/27/16 los.  ° ° °

## 2016-07-30 ENCOUNTER — Telehealth: Payer: Self-pay | Admitting: *Deleted

## 2016-07-30 ENCOUNTER — Other Ambulatory Visit: Payer: Self-pay | Admitting: *Deleted

## 2016-07-30 MED ORDER — FLUCONAZOLE 100 MG PO TABS: 100.0000 mg | ORAL_TABLET | Freq: Every day | ORAL | 0 refills | Status: DC

## 2016-07-30 NOTE — Telephone Encounter (Signed)
Call placed to patient to inform her per Dr. Benay Spice that prescription for Diflucan 100 mg daily x 5 days will be sent in to her pharmacy for throat discomfort.  [facility] [address] [facility] pt[facility]s request.  Patient appreciative of call back.

## 2016-07-30 NOTE — Telephone Encounter (Signed)
Call received from patient stating that she is having difficulty swallowing d/t pain from her neck to her chest.  She is having continued belching and difficulty eating without nausea. She denies any mouth sores. She has taken Tums and Pepto bismol with no relief.  Informed pt that I would speak with Dr. Benay Spice and call her back with instructions.

## 2016-07-31 ENCOUNTER — Telehealth: Payer: Self-pay | Admitting: *Deleted

## 2016-07-31 ENCOUNTER — Other Ambulatory Visit (HOSPITAL_BASED_OUTPATIENT_CLINIC_OR_DEPARTMENT_OTHER): Payer: Medicare Other

## 2016-07-31 DIAGNOSIS — C8335 Diffuse large B-cell lymphoma, lymph nodes of inguinal region and lower limb: Secondary | ICD-10-CM | POA: Diagnosis not present

## 2016-07-31 LAB — CBC WITH DIFFERENTIAL/PLATELET
BASO%: 0.6 % (ref 0.0–2.0)
BASOS ABS: 0 10*3/uL (ref 0.0–0.1)
EOS ABS: 0 10*3/uL (ref 0.0–0.5)
EOS%: 0.6 % (ref 0.0–7.0)
HEMATOCRIT: 36.6 % (ref 34.8–46.6)
HEMOGLOBIN: 12.7 g/dL (ref 11.6–15.9)
LYMPH#: 0.4 10*3/uL — AB (ref 0.9–3.3)
LYMPH%: 22.8 % (ref 14.0–49.7)
MCH: 30.2 pg (ref 25.1–34.0)
MCHC: 34.7 g/dL (ref 31.5–36.0)
MCV: 86.9 fL (ref 79.5–101.0)
MONO#: 0.6 10*3/uL (ref 0.1–0.9)
MONO%: 35.1 % — ABNORMAL HIGH (ref 0.0–14.0)
NEUT#: 0.7 10*3/uL — ABNORMAL LOW (ref 1.5–6.5)
NEUT%: 40.9 % (ref 38.4–76.8)
PLATELETS: 321 10*3/uL (ref 145–400)
RBC: 4.21 10*6/uL (ref 3.70–5.45)
RDW: 12.9 % (ref 11.2–14.5)
WBC: 1.7 10*3/uL — ABNORMAL LOW (ref 3.9–10.3)
nRBC: 0 % (ref 0–0)

## 2016-07-31 NOTE — Telephone Encounter (Signed)
Message left for pt to return call to [facility] for instructions from MD.

## 2016-07-31 NOTE — Telephone Encounter (Signed)
Patient notified per order of Dr. Benay Spice to stop Cipro and that CBC is improving.  Patient states that her throat is feeling "much better" after taking 2 doses of Diflucan.  Patient appreciative of call and has no questions at this time.

## 2016-08-05 ENCOUNTER — Other Ambulatory Visit: Payer: Self-pay | Admitting: Oncology

## 2016-08-07 ENCOUNTER — Ambulatory Visit: Payer: Medicare Other

## 2016-08-07 ENCOUNTER — Ambulatory Visit (HOSPITAL_BASED_OUTPATIENT_CLINIC_OR_DEPARTMENT_OTHER): Payer: Medicare Other | Admitting: Oncology

## 2016-08-07 ENCOUNTER — Telehealth: Payer: Self-pay | Admitting: Oncology

## 2016-08-07 ENCOUNTER — Other Ambulatory Visit (HOSPITAL_BASED_OUTPATIENT_CLINIC_OR_DEPARTMENT_OTHER): Payer: Medicare Other

## 2016-08-07 ENCOUNTER — Ambulatory Visit (HOSPITAL_BASED_OUTPATIENT_CLINIC_OR_DEPARTMENT_OTHER): Payer: Medicare Other

## 2016-08-07 VITALS — BP 99/56 | HR 78 | Temp 97.6°F | Resp 17

## 2016-08-07 VITALS — BP 123/77 | HR 71 | Temp 98.3°F | Resp 17 | Ht 61.0 in | Wt 187.0 lb

## 2016-08-07 DIAGNOSIS — D701 Agranulocytosis secondary to cancer chemotherapy: Secondary | ICD-10-CM

## 2016-08-07 DIAGNOSIS — Z5111 Encounter for antineoplastic chemotherapy: Secondary | ICD-10-CM

## 2016-08-07 DIAGNOSIS — C8300 Small cell B-cell lymphoma, unspecified site: Secondary | ICD-10-CM

## 2016-08-07 DIAGNOSIS — M1990 Unspecified osteoarthritis, unspecified site: Secondary | ICD-10-CM

## 2016-08-07 DIAGNOSIS — D6959 Other secondary thrombocytopenia: Secondary | ICD-10-CM | POA: Diagnosis not present

## 2016-08-07 DIAGNOSIS — Z5112 Encounter for antineoplastic immunotherapy: Secondary | ICD-10-CM | POA: Diagnosis not present

## 2016-08-07 DIAGNOSIS — C8335 Diffuse large B-cell lymphoma, lymph nodes of inguinal region and lower limb: Secondary | ICD-10-CM

## 2016-08-07 DIAGNOSIS — C8510 Unspecified B-cell lymphoma, unspecified site: Secondary | ICD-10-CM

## 2016-08-07 DIAGNOSIS — Z95828 Presence of other vascular implants and grafts: Secondary | ICD-10-CM

## 2016-08-07 DIAGNOSIS — I10 Essential (primary) hypertension: Secondary | ICD-10-CM

## 2016-08-07 DIAGNOSIS — E7800 Pure hypercholesterolemia, unspecified: Secondary | ICD-10-CM

## 2016-08-07 DIAGNOSIS — E039 Hypothyroidism, unspecified: Secondary | ICD-10-CM

## 2016-08-07 LAB — COMPREHENSIVE METABOLIC PANEL
ALK PHOS: 82 U/L (ref 40–150)
ALT: 14 U/L (ref 0–55)
ANION GAP: 10 meq/L (ref 3–11)
AST: 18 U/L (ref 5–34)
Albumin: 3.4 g/dL — ABNORMAL LOW (ref 3.5–5.0)
BILIRUBIN TOTAL: 0.43 mg/dL (ref 0.20–1.20)
BUN: 19.7 mg/dL (ref 7.0–26.0)
CO2: 26 meq/L (ref 22–29)
Calcium: 9.1 mg/dL (ref 8.4–10.4)
Chloride: 104 mEq/L (ref 98–109)
Creatinine: 0.7 mg/dL (ref 0.6–1.1)
EGFR: 81 mL/min/{1.73_m2} — ABNORMAL LOW (ref >90)
Glucose: 98 mg/dl (ref 70–140)
Potassium: 4.1 mEq/L (ref 3.5–5.1)
Sodium: 139 mEq/L (ref 136–145)
TOTAL PROTEIN: 6.6 g/dL (ref 6.4–8.3)

## 2016-08-07 LAB — CBC WITH DIFFERENTIAL/PLATELET
BASO%: 0.9 % (ref 0.0–2.0)
BASOS ABS: 0 10*3/uL (ref 0.0–0.1)
EOS ABS: 0 10*3/uL (ref 0.0–0.5)
EOS%: 0.3 % (ref 0.0–7.0)
HEMATOCRIT: 36.3 % (ref 34.8–46.6)
HEMOGLOBIN: 12 g/dL (ref 11.6–15.9)
LYMPH#: 0.5 10*3/uL — AB (ref 0.9–3.3)
LYMPH%: 12.2 % — ABNORMAL LOW (ref 14.0–49.7)
MCH: 29.6 pg (ref 25.1–34.0)
MCHC: 33.1 g/dL (ref 31.5–36.0)
MCV: 89.3 fL (ref 79.5–101.0)
MONO#: 0.6 10*3/uL (ref 0.1–0.9)
MONO%: 15 % — AB (ref 0.0–14.0)
NEUT%: 71.6 % (ref 38.4–76.8)
NEUTROS ABS: 3.1 10*3/uL (ref 1.5–6.5)
Platelets: 473 10*3/uL — ABNORMAL HIGH (ref 145–400)
RBC: 4.06 10*6/uL (ref 3.70–5.45)
RDW: 13.6 % (ref 11.2–14.5)
WBC: 4.3 10*3/uL (ref 3.9–10.3)

## 2016-08-07 LAB — LACTATE DEHYDROGENASE: LDH: 254 U/L — AB (ref 125–245)

## 2016-08-07 MED ORDER — SODIUM CHLORIDE 0.9% FLUSH
10.0000 mL | INTRAVENOUS | Status: DC | PRN
  Administered 2016-08-07: 10 mL
  Filled 2016-08-07: qty 10

## 2016-08-07 MED ORDER — ACETAMINOPHEN 325 MG PO TABS
ORAL_TABLET | ORAL | Status: AC
  Filled 2016-08-07: qty 2

## 2016-08-07 MED ORDER — DIPHENHYDRAMINE HCL 25 MG PO CAPS
50.0000 mg | ORAL_CAPSULE | Freq: Once | ORAL | Status: AC
  Administered 2016-08-07: 50 mg via ORAL

## 2016-08-07 MED ORDER — HEPARIN SOD (PORK) LOCK FLUSH 100 UNIT/ML IV SOLN
500.0000 [IU] | Freq: Once | INTRAVENOUS | Status: AC | PRN
  Administered 2016-08-07: 500 [IU]
  Filled 2016-08-07: qty 5

## 2016-08-07 MED ORDER — DIPHENHYDRAMINE HCL 25 MG PO CAPS
ORAL_CAPSULE | ORAL | Status: AC
Start: 2016-08-07 — End: 2016-08-07
  Filled 2016-08-07: qty 2

## 2016-08-07 MED ORDER — SODIUM CHLORIDE 0.9 % IV SOLN
750.0000 mg/m2 | Freq: Once | INTRAVENOUS | Status: AC
  Administered 2016-08-07: 1460 mg via INTRAVENOUS
  Filled 2016-08-07: qty 73

## 2016-08-07 MED ORDER — PALONOSETRON HCL INJECTION 0.25 MG/5ML
0.2500 mg | Freq: Once | INTRAVENOUS | Status: AC
  Administered 2016-08-07: 0.25 mg via INTRAVENOUS

## 2016-08-07 MED ORDER — VINCRISTINE SULFATE CHEMO INJECTION 1 MG/ML
2.0000 mg | Freq: Once | INTRAVENOUS | Status: AC
  Administered 2016-08-07: 2 mg via INTRAVENOUS
  Filled 2016-08-07: qty 2

## 2016-08-07 MED ORDER — PEGFILGRASTIM 6 MG/0.6ML ~~LOC~~ PSKT
6.0000 mg | PREFILLED_SYRINGE | Freq: Once | SUBCUTANEOUS | Status: AC
  Administered 2016-08-07: 6 mg via SUBCUTANEOUS
  Filled 2016-08-07: qty 0.6

## 2016-08-07 MED ORDER — SODIUM CHLORIDE 0.9 % IV SOLN
Freq: Once | INTRAVENOUS | Status: AC
  Administered 2016-08-07: 11:00:00 via INTRAVENOUS

## 2016-08-07 MED ORDER — LACTATED RINGERS IV SOLN
515.0000 mg/m2 | Freq: Once | INTRAVENOUS | Status: AC
  Administered 2016-08-07: 1000 mg via INTRAVENOUS
  Filled 2016-08-07: qty 100

## 2016-08-07 MED ORDER — PREDNISONE 20 MG PO TABS: 80.0000 mg | ORAL_TABLET | Freq: Every day | ORAL | 0 refills | Status: AC

## 2016-08-07 MED ORDER — RITUXIMAB CHEMO INJECTION 500 MG/50ML
375.0000 mg/m2 | Freq: Once | INTRAVENOUS | Status: AC
  Administered 2016-08-07: 700 mg via INTRAVENOUS
  Filled 2016-08-07: qty 50

## 2016-08-07 MED ORDER — SODIUM CHLORIDE 0.9% FLUSH
10.0000 mL | INTRAVENOUS | Status: AC | PRN
  Administered 2016-08-07: 10 mL
  Filled 2016-08-07: qty 10

## 2016-08-07 MED ORDER — DEXAMETHASONE SODIUM PHOSPHATE 10 MG/ML IJ SOLN
10.0000 mg | Freq: Once | INTRAMUSCULAR | Status: AC
  Administered 2016-08-07: 10 mg via INTRAVENOUS

## 2016-08-07 MED ORDER — DEXAMETHASONE SODIUM PHOSPHATE 10 MG/ML IJ SOLN
INTRAMUSCULAR | Status: AC
  Filled 2016-08-07: qty 1

## 2016-08-07 MED ORDER — ACETAMINOPHEN 325 MG PO TABS
650.0000 mg | ORAL_TABLET | Freq: Once | ORAL | Status: AC
  Administered 2016-08-07: 650 mg via ORAL

## 2016-08-07 MED ORDER — PALONOSETRON HCL INJECTION 0.25 MG/5ML
INTRAVENOUS | Status: AC
  Filled 2016-08-07: qty 5

## 2016-08-07 MED ORDER — DOXORUBICIN HCL CHEMO IV INJECTION 2 MG/ML
51.0000 mg/m2 | Freq: Once | INTRAVENOUS | Status: AC
  Administered 2016-08-07: 100 mg via INTRAVENOUS
  Filled 2016-08-07: qty 50

## 2016-08-07 NOTE — Addendum Note (Signed)
Addended by: Brien Few on: 08/07/2016 10:41 AM   Modules accepted: Orders

## 2016-08-07 NOTE — Patient Instructions (Signed)
[facility] Discharge Instructions for Patients Receiving Chemotherapy  Today you received the following chemotherapy agents Zinecard, Adriamycin, Vincristine, Cytoxan and Rituxan   To help prevent nausea and vomiting after your treatment, we encourage you to take your nausea medication as directed. No Zofran for 3 days.    If you develop nausea and vomiting that is not controlled by your nausea medication, call the clinic.   BELOW ARE SYMPTOMS THAT SHOULD BE REPORTED IMMEDIATELY:  *FEVER GREATER THAN 100.5 F  *CHILLS WITH OR WITHOUT FEVER  NAUSEA AND VOMITING THAT IS NOT CONTROLLED WITH YOUR NAUSEA MEDICATION  *UNUSUAL SHORTNESS OF BREATH  *UNUSUAL BRUISING OR BLEEDING  TENDERNESS IN MOUTH AND THROAT WITH OR WITHOUT PRESENCE OF ULCERS  *URINARY PROBLEMS  *BOWEL PROBLEMS  UNUSUAL RASH Items with * indicate a potential emergency and should be followed up as soon as possible.  Feel free to call the clinic you have any questions or concerns. The clinic phone number is [phone].  Please show the [facility] at check-in to the Emergency Department and triage nurse.

## 2016-08-07 NOTE — Progress Notes (Signed)
[facility] OFFICE PROGRESS NOTE   Diagnosis: Non-Hodgkin's lymphoma  INTERVAL HISTORY:   Kelly Ray returns as scheduled. She feels well at present. No odynophagia. The skin lesions at the right lower leg continue to regress. No new complaint.  Objective:  Vital signs in last 24 hours:  Blood pressure 123/77, pulse 71, temperature 98.3 F (36.8 C), temperature source Oral, resp. rate 17, height '5\' 1"'$  (1.549 m), weight 187 lb (84.8 kg), SpO2 99 %.    HEENT: No thrush, healing ulcers at the lower outer lip Resp: Fine rales at the extreme posterior base bilaterally, no respiratory distress Cardio: Regular rate and rhythm GI: No hepatosplenomegaly, nontender Vascular: No leg edema  Skin: Dominant lesion at the right calf is smaller, the previously noted smaller lesions at the right calf or no longer palpable   Portacath/PICC-without erythema  Lab Results:  Lab Results  Component Value Date   WBC 4.3 08/07/2016   HGB 12.0 08/07/2016   HCT 36.3 08/07/2016   MCV 89.3 08/07/2016   PLT 473 (H) 08/07/2016   NEUTROABS 3.1 08/07/2016     Medications: I have reviewed the patient's current medications.  Assessment/Plan: 1. 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 Negative restaging CT evaluation 10/24/2012  Nodular skin lesions at the right lower leg, status post a shave biopsy 07/15/2013 confirming a malignant B-cell lymphoma, diffuse large cell positive for CD20, BCL 6, BCL 2, and CD10   Staging bone marrow biopsy 07/31/2013, negative for involvement with lymphoma   Staging PET scan 07/30/2013-negative.   Status post palliative radiation right lower leg nodular skin lesions 08/17/2013 through 09/03/2013.  New nodular skin lesions at the right lower leg April 2017, status postpsies of lesions at the right lower leg 12/07/2015 confirming large B-cell  lymphoma, CD20 positive bio  staging PET scan 12/26/2015-hypermetabolic cutaneous and subcutaneous nodules in the right lower extremity, no other evidence of lymphoma  Cycle 1 bendamustine/Rituxan 01/03/2016  Cycle 2 bendamustine/Rituxan 01/31/2016  Clinical progression with enlargement of multiple right lower leg skin lesions 02/23/2016  Status post radiation right lower leg 03/14/2016 through 03/29/2016  Clinical progression with new and enlarging skin lesions right lower leg 07/06/2016  PET scan 07/12/2016-clear interval increase in size and metabolic activity of a large nodule posterior right calf and a smaller subcutaneous nodule medial right thigh; several more superficial right lower extremity lesions improved with reduction in size and metabolic activity; single focus of metabolic activity associated with the medial right clavicle with bonychange  Cycle 1 CHOP/Rituxan 07/17/2016  Cycle 2 CHOP/Rituxan 08/07/2016-Neulasta added 2. Port-A-Cath placement 06/01/2011. Port-A-Cath removal 11/04/2012 3. Hypertension. 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 5. Hypercholesterolemia. 6. Osteoarthritis. 7. Malaise-? Secondary to progression of non-Hodgkin's lymphoma. Improved. 8. Staph infection right lower leg February 2015. 9. Right calf lesion with purulent drainage 02/09/2016, culture obtained-group B strep, doxycycline prescribed 10. Port-A-Cath placement 07/13/2016 11. 2-D echo 07/10/2016-estimated ejection fraction range of 55-60% 12. 07/17/2016 hepatitis B surface antigen negative, core antibody positive 13. Neutropenia secondary to chemotherapy 07/27/2016 14. Thrombocytopenia secondary to chemotherapy 07/27/2016   Disposition:  Kelly Ray appears well. She will complete cycle 2 CHOP-rituximab today. She will receive Neulasta with this cycle. She will contact us for a fever or odynophagia. She will return for a nadir CBC on 08/17/2016.  Kelly Ray scheduled for an office visit and  cycle 3 CHOP-rituximab on 08/28/2016.   Betsy Coder, MD  08/07/2016  10:14 AM

## 2016-08-07 NOTE — Telephone Encounter (Signed)
Appointments scheduled per 12/12 LOS. Patient had to leave before scheduling appointment to go to infusion room. Patient plans to pick up calendar and report from infusion room.

## 2016-08-15 ENCOUNTER — Telehealth: Payer: Self-pay | Admitting: *Deleted

## 2016-08-15 NOTE — Telephone Encounter (Signed)
Returned call to pt, she reports bone pain, sore throat and decreased appetite. Pt has begun Ensure to supplement meals. Pt denies mouth sores, stated throat hurts if dry. She reports drinking about 72 oz daily. She is taking Aleve BID with some relief of pain. Instructed her to add Tylenol between doses of Aleve.  Pt last treated with RCHOP on 12/12 with Neulasta.  Discussed with Ned Card, NP: Likely side effects of Neulasta. Call office for fever/ shaking chills or other signs of infection. OK, per provider to add Tylenol for bone pain. Called pt with above instructions, she voiced understanding. She will wait to speak with RN after lab on 12/22 if she doesn't feel better by then.

## 2016-08-17 ENCOUNTER — Other Ambulatory Visit: Payer: Self-pay | Admitting: *Deleted

## 2016-08-17 ENCOUNTER — Ambulatory Visit: Payer: Medicare Other

## 2016-08-17 ENCOUNTER — Ambulatory Visit (HOSPITAL_BASED_OUTPATIENT_CLINIC_OR_DEPARTMENT_OTHER): Payer: Medicare Other | Admitting: Nurse Practitioner

## 2016-08-17 ENCOUNTER — Encounter: Payer: Self-pay | Admitting: *Deleted

## 2016-08-17 ENCOUNTER — Telehealth: Payer: Self-pay | Admitting: *Deleted

## 2016-08-17 ENCOUNTER — Other Ambulatory Visit (HOSPITAL_BASED_OUTPATIENT_CLINIC_OR_DEPARTMENT_OTHER): Payer: Medicare Other

## 2016-08-17 ENCOUNTER — Other Ambulatory Visit (HOSPITAL_BASED_OUTPATIENT_CLINIC_OR_DEPARTMENT_OTHER): Payer: Medicare Other | Admitting: *Deleted

## 2016-08-17 VITALS — BP 132/98 | HR 81

## 2016-08-17 VITALS — BP 101/60 | HR 95 | Temp 97.7°F | Resp 18

## 2016-08-17 DIAGNOSIS — C8335 Diffuse large B-cell lymphoma, lymph nodes of inguinal region and lower limb: Secondary | ICD-10-CM

## 2016-08-17 DIAGNOSIS — C8300 Small cell B-cell lymphoma, unspecified site: Secondary | ICD-10-CM

## 2016-08-17 DIAGNOSIS — E876 Hypokalemia: Secondary | ICD-10-CM

## 2016-08-17 DIAGNOSIS — K209 Esophagitis, unspecified without bleeding: Secondary | ICD-10-CM

## 2016-08-17 DIAGNOSIS — N390 Urinary tract infection, site not specified: Secondary | ICD-10-CM

## 2016-08-17 DIAGNOSIS — I959 Hypotension, unspecified: Secondary | ICD-10-CM

## 2016-08-17 DIAGNOSIS — E860 Dehydration: Secondary | ICD-10-CM

## 2016-08-17 DIAGNOSIS — R319 Hematuria, unspecified: Secondary | ICD-10-CM

## 2016-08-17 DIAGNOSIS — D61810 Antineoplastic chemotherapy induced pancytopenia: Secondary | ICD-10-CM

## 2016-08-17 DIAGNOSIS — Z95828 Presence of other vascular implants and grafts: Secondary | ICD-10-CM | POA: Insufficient documentation

## 2016-08-17 LAB — CBC WITH DIFFERENTIAL/PLATELET
BASO%: 0.9 % (ref 0.0–2.0)
Basophils Absolute: 0 10*3/uL (ref 0.0–0.1)
EOS%: 1 % (ref 0.0–7.0)
Eosinophils Absolute: 0 10*3/uL (ref 0.0–0.5)
HCT: 33.9 % — ABNORMAL LOW (ref 34.8–46.6)
HEMOGLOBIN: 11.4 g/dL — AB (ref 11.6–15.9)
LYMPH#: 0.2 10*3/uL — AB (ref 0.9–3.3)
LYMPH%: 11.9 % — ABNORMAL LOW (ref 14.0–49.7)
MCH: 29.2 pg (ref 25.1–34.0)
MCHC: 33.6 g/dL (ref 31.5–36.0)
MCV: 87.1 fL (ref 79.5–101.0)
MONO#: 0.3 10*3/uL (ref 0.1–0.9)
MONO%: 16.9 % — ABNORMAL HIGH (ref 0.0–14.0)
NEUT%: 69.3 % (ref 38.4–76.8)
NEUTROS ABS: 1.1 10*3/uL — AB (ref 1.5–6.5)
PLATELETS: 67 10*3/uL — AB (ref 145–400)
RBC: 3.89 10*6/uL (ref 3.70–5.45)
RDW: 13.3 % (ref 11.2–14.5)
WBC: 1.5 10*3/uL — AB (ref 3.9–10.3)

## 2016-08-17 LAB — COMPREHENSIVE METABOLIC PANEL
ALT: 18 U/L (ref 0–55)
ANION GAP: 8 meq/L (ref 3–11)
AST: 14 U/L (ref 5–34)
Albumin: 2.9 g/dL — ABNORMAL LOW (ref 3.5–5.0)
Alkaline Phosphatase: 71 U/L (ref 40–150)
BILIRUBIN TOTAL: 0.65 mg/dL (ref 0.20–1.20)
BUN: 19.9 mg/dL (ref 7.0–26.0)
CALCIUM: 8.6 mg/dL (ref 8.4–10.4)
CO2: 28 meq/L (ref 22–29)
Chloride: 95 mEq/L — ABNORMAL LOW (ref 98–109)
Creatinine: 0.7 mg/dL (ref 0.6–1.1)
EGFR: 85 mL/min/{1.73_m2} — AB (ref >90)
Glucose: 147 mg/dl — ABNORMAL HIGH (ref 70–140)
Potassium: 3.2 mEq/L — ABNORMAL LOW (ref 3.5–5.1)
Sodium: 131 mEq/L — ABNORMAL LOW (ref 136–145)
TOTAL PROTEIN: 5.7 g/dL — AB (ref 6.4–8.3)

## 2016-08-17 LAB — URINALYSIS, MICROSCOPIC - CHCC
BILIRUBIN (URINE): NEGATIVE
GLUCOSE UR CHCC: NEGATIVE mg/dL
Ketones: NEGATIVE mg/dL
NITRITE: NEGATIVE
PH: 7.5 (ref 4.6–8.0)
Specific Gravity, Urine: 1.01 (ref 1.003–1.035)
UROBILINOGEN UR: 2 mg/dL (ref 0.2–1)

## 2016-08-17 MED ORDER — CIPROFLOXACIN HCL 500 MG PO TABS: 500.0000 mg | ORAL_TABLET | Freq: Two times a day (BID) | ORAL | 0 refills | Status: DC

## 2016-08-17 MED ORDER — SODIUM CHLORIDE 0.9% FLUSH
10.0000 mL | Freq: Once | INTRAVENOUS | Status: AC
  Administered 2016-08-17: 10 mL via INTRAVENOUS
  Filled 2016-08-17: qty 10

## 2016-08-17 MED ORDER — HEPARIN SOD (PORK) LOCK FLUSH 100 UNIT/ML IV SOLN
500.0000 [IU] | Freq: Once | INTRAVENOUS | Status: DC | PRN
  Filled 2016-08-17: qty 5

## 2016-08-17 MED ORDER — HEPARIN SOD (PORK) LOCK FLUSH 100 UNIT/ML IV SOLN
500.0000 [IU] | Freq: Once | INTRAVENOUS | Status: AC
  Administered 2016-08-17: 500 [IU] via INTRAVENOUS
  Filled 2016-08-17: qty 5

## 2016-08-17 MED ORDER — POTASSIUM CHLORIDE CRYS ER 20 MEQ PO TBCR: 20.0000 meq | EXTENDED_RELEASE_TABLET | Freq: Every day | ORAL | 0 refills | Status: DC

## 2016-08-17 MED ORDER — SUCRALFATE 1 GM/10ML PO SUSP: 1.0000 g | Freq: Three times a day (TID) | ORAL | 1 refills | Status: DC

## 2016-08-17 MED ORDER — SODIUM CHLORIDE 0.9% FLUSH
10.0000 mL | INTRAVENOUS | Status: DC | PRN
  Administered 2016-08-17: 10 mL via INTRAVENOUS
  Filled 2016-08-17: qty 10

## 2016-08-17 MED ORDER — POTASSIUM CHLORIDE CRYS ER 20 MEQ PO TBCR
20.0000 meq | EXTENDED_RELEASE_TABLET | Freq: Once | ORAL | Status: AC
  Administered 2016-08-17: 20 meq via ORAL
  Filled 2016-08-17: qty 1

## 2016-08-17 MED ORDER — LOSARTAN POTASSIUM-HCTZ 50-12.5 MG PO TABS: ORAL_TABLET | ORAL | 0 refills | Status: DC

## 2016-08-17 MED ORDER — SODIUM CHLORIDE 0.9 % IV SOLN
1000.0000 mL | Freq: Once | INTRAVENOUS | Status: AC
  Administered 2016-08-17: 1000 mL via INTRAVENOUS

## 2016-08-17 NOTE — Patient Instructions (Addendum)
Dehydration, Adult Dehydration is a condition in which there is not enough fluid or water in the body. This happens when you lose more fluids than you take in. Important organs, such as the kidneys, brain, and heart, cannot function without a proper amount of fluids. Any loss of fluids from the body can lead to dehydration. Dehydration can range from mild to severe. This condition should be treated right away to prevent it from becoming severe. What are the causes? This condition may be caused by:  Vomiting.  Diarrhea.  Excessive sweating, such as from heat exposure or exercise.  Not drinking enough fluid, especially:  When ill.  While doing activity that requires a lot of energy.  Excessive urination.  Fever.  Infection.  Certain medicines, such as medicines that cause the body to lose excess fluid (diuretics).  Inability to access safe drinking water.  Reduced physical ability to get adequate water and food. What increases the risk? This condition is more likely to develop in people:  Who have a poorly controlled long-term (chronic) illness, such as diabetes, heart disease, or kidney disease.  Who are age 65 or older.  Who are disabled.  Who live in a place with high altitude.  Who play endurance sports. What are the signs or symptoms? Symptoms of mild dehydration may include:   Thirst.  Dry lips.  Slightly dry mouth.  Dry, warm skin.  Dizziness. Symptoms of moderate dehydration may include:   Very dry mouth.  Muscle cramps.  Dark urine. Urine may be the color of tea.  Decreased urine production.  Decreased tear production.  Heartbeat that is irregular or faster than normal (palpitations).  Headache.  Light-headedness, especially when you stand up from a sitting position.  Fainting (syncope). Symptoms of severe dehydration may include:   Changes in skin, such as:  Cold and clammy skin.  Blotchy (mottled) or pale skin.  Skin that does  not quickly return to normal after being lightly pinched and released (poor skin turgor).  Changes in body fluids, such as:  Extreme thirst.  No tear production.  Inability to sweat when body temperature is high, such as in hot weather.  Very little urine production.  Changes in vital signs, such as:  Weak pulse.  Pulse that is more than 100 beats a minute when sitting still.  Rapid breathing.  Low blood pressure.  Other changes, such as:  Sunken eyes.  Cold hands and feet.  Confusion.  Lack of energy (lethargy).  Difficulty waking up from sleep.  Short-term weight loss.  Unconsciousness. How is this diagnosed? This condition is diagnosed based on your symptoms and a physical exam. Blood and urine tests may be done to help confirm the diagnosis. How is this treated? Treatment for this condition depends on the severity. Mild or moderate dehydration can often be treated at home. Treatment should be started right away. Do not wait until dehydration becomes severe. Severe dehydration is an emergency and it needs to be treated in a hospital. Treatment for mild dehydration may include:   Drinking more fluids.  Replacing salts and minerals in your blood (electrolytes) that you may have lost. Treatment for moderate dehydration may include:   Drinking an oral rehydration solution (ORS). This is a drink that helps you replace fluids and electrolytes (rehydrate). It can be found at pharmacies and retail stores. Treatment for severe dehydration may include:   Receiving fluids through an IV tube.  Receiving an electrolyte solution through a feeding tube that is   passed through your nose and into your stomach (nasogastric tube, or NG tube).  Correcting any abnormalities in electrolytes.  Treating the underlying cause of dehydration. Follow these instructions at home:  If directed by your health care provider, drink an ORS:  Make an ORS by following instructions on the  package.  Start by drinking small amounts, about  cup (120 mL) every 5-10 minutes.  Slowly increase how much you drink until you have taken the amount recommended by your health care provider.  Drink enough clear fluid to keep your urine clear or pale yellow. If you were told to drink an ORS, finish the ORS first, then start slowly drinking other clear fluids. Drink fluids such as:  Water. Do not drink only water. Doing that can lead to having too little salt (sodium) in the body (hyponatremia).  Ice chips.  Fruit juice that you have added water to (diluted fruit juice).  Low-calorie sports drinks.  Avoid:  Alcohol.  Drinks that contain a lot of sugar. These include high-calorie sports drinks, fruit juice that is not diluted, and soda.  Caffeine.  Foods that are greasy or contain a lot of fat or sugar.  Take over-the-counter and prescription medicines only as told by your health care provider.  Do not take sodium tablets. This can lead to having too much sodium in the body (hypernatremia).  Eat foods that contain a healthy balance of electrolytes, such as bananas, oranges, potatoes, tomatoes, and spinach.  Keep all follow-up visits as told by your health care provider. This is important. Contact a health care provider if:  You have abdominal pain that:  Gets worse.  Stays in one area (localizes).  You have a rash.  You have a stiff neck.  You are more irritable than usual.  You are sleepier or more difficult to wake up than usual.  You feel weak or dizzy.  You feel very thirsty.  You have urinated only a small amount of very dark urine over 6-8 hours. Get help right away if:  You have symptoms of severe dehydration.  You cannot drink fluids without vomiting.  Your symptoms get worse with treatment.  You have a fever.  You have a severe headache.  You have vomiting or diarrhea that:  Gets worse.  Does not go away.  You have blood or green matter  (bile) in your vomit.  You have blood in your stool. This may cause stool to look black and tarry.  You have not urinated in 6-8 hours.  You faint.  Your heart rate while sitting still is over 100 beats a minute.  You have trouble breathing. This information is not intended to replace advice given to you by your health care provider. Make sure you discuss any questions you have with your health care provider. Document Released: 08/13/2005 Document Revised: 03/09/2016 Document Reviewed: 10/07/2015 Elsevier Interactive Patient Education  2017 Elsevier Inc.    Urinary Tract Infection, Adult A urinary tract infection (UTI) is an infection of any part of the urinary tract, which includes the kidneys, ureters, bladder, and urethra. These organs make, store, and get rid of urine in the body. UTI can be a bladder infection (cystitis) or kidney infection (pyelonephritis). What are the causes? This infection may be caused by fungi, viruses, or bacteria. Bacteria are the most common cause of UTIs. This condition can also be caused by repeated incomplete emptying of the bladder during urination. What increases the risk? This condition is more likely to develop   if:  You ignore your need to urinate or hold urine for long periods of time.  You do not empty your bladder completely during urination.  You wipe back to front after urinating or having a bowel movement, if you are female.  You are uncircumcised, if you are female.  You are constipated.  You have a urinary catheter that stays in place (indwelling).  You have a weak defense (immune) system.  You have a medical condition that affects your bowels, kidneys, or bladder.  You have diabetes.  You take antibiotic medicines frequently or for long periods of time, and the antibiotics no longer work well against certain types of infections (antibiotic resistance).  You take medicines that irritate your urinary tract.  You are exposed to  chemicals that irritate your urinary tract.  You are female. What are the signs or symptoms? Symptoms of this condition include:  Fever.  Frequent urination or passing small amounts of urine frequently.  Needing to urinate urgently.  Pain or burning with urination.  Urine that smells bad or unusual.  Cloudy urine.  Pain in the lower abdomen or back.  Trouble urinating.  Blood in the urine.  Vomiting or being less hungry than normal.  Diarrhea or abdominal pain.  Vaginal discharge, if you are female. How is this diagnosed? This condition is diagnosed with a medical history and physical exam. You will also need to provide a urine sample to test your urine. Other tests may be done, including:  Blood tests.  Sexually transmitted disease (STD) testing. If you have had more than one UTI, a cystoscopy or imaging studies may be done to determine the cause of the infections. How is this treated? Treatment for this condition often includes a combination of two or more of the following:  Antibiotic medicine.  Other medicines to treat less common causes of UTI.  Over-the-counter medicines to treat pain.  Drinking enough water to stay hydrated. Follow these instructions at home:  Take over-the-counter and prescription medicines only as told by your health care provider.  If you were prescribed an antibiotic, take it as told by your health care provider. Do not stop taking the antibiotic even if you start to feel better.  Avoid alcohol, caffeine, tea, and carbonated beverages. They can irritate your bladder.  Drink enough fluid to keep your urine clear or pale yellow.  Keep all follow-up visits as told by your health care provider. This is important.  Make sure to:  Empty your bladder often and completely. Do not hold urine for long periods of time.  Empty your bladder before and after sex.  Wipe from front to back after a bowel movement if you are female. Use each  tissue one time when you wipe. Contact a health care provider if:  You have back pain.  You have a fever.  You feel nauseous or vomit.  Your symptoms do not get better after 3 days.  Your symptoms go away and then return. Get help right away if:  You have severe back pain or lower abdominal pain.  You are vomiting and cannot keep down any medicines or water. This information is not intended to replace advice given to you by your health care provider. Make sure you discuss any questions you have with your health care provider. Document Released: 05/23/2005 Document Revised: 01/25/2016 Document Reviewed: 07/04/2015 Elsevier Interactive Patient Education  2017 Elsevier Inc.   

## 2016-08-17 NOTE — Progress Notes (Unsigned)
Call received from Ephriam Knuckles LPN from flush room stating that patient is dizzy with extreme fatigue and has possible thrush.  Michel Harrow NP notified and patient will be placed on [facility] schedule to be seen this AM.

## 2016-08-17 NOTE — Telephone Encounter (Signed)
Message left with patient to inform her that Dr. Benay Spice would like for her to come to [facility] on 08/21/16 for lab appt and that schedulers would be calling her to make that appt.  Instructed pt to call [place] back with any questions,

## 2016-08-19 LAB — URINE CULTURE

## 2016-08-21 ENCOUNTER — Telehealth: Payer: Self-pay | Admitting: *Deleted

## 2016-08-21 ENCOUNTER — Encounter: Payer: Self-pay | Admitting: Nurse Practitioner

## 2016-08-21 ENCOUNTER — Other Ambulatory Visit (HOSPITAL_BASED_OUTPATIENT_CLINIC_OR_DEPARTMENT_OTHER): Payer: Medicare Other

## 2016-08-21 ENCOUNTER — Other Ambulatory Visit: Payer: Self-pay | Admitting: *Deleted

## 2016-08-21 DIAGNOSIS — K209 Esophagitis, unspecified without bleeding: Secondary | ICD-10-CM | POA: Insufficient documentation

## 2016-08-21 DIAGNOSIS — I959 Hypotension, unspecified: Secondary | ICD-10-CM | POA: Insufficient documentation

## 2016-08-21 DIAGNOSIS — C8335 Diffuse large B-cell lymphoma, lymph nodes of inguinal region and lower limb: Secondary | ICD-10-CM

## 2016-08-21 DIAGNOSIS — E876 Hypokalemia: Secondary | ICD-10-CM | POA: Insufficient documentation

## 2016-08-21 DIAGNOSIS — E860 Dehydration: Secondary | ICD-10-CM | POA: Insufficient documentation

## 2016-08-21 DIAGNOSIS — N390 Urinary tract infection, site not specified: Secondary | ICD-10-CM | POA: Insufficient documentation

## 2016-08-21 DIAGNOSIS — D61810 Antineoplastic chemotherapy induced pancytopenia: Secondary | ICD-10-CM | POA: Insufficient documentation

## 2016-08-21 DIAGNOSIS — C8300 Small cell B-cell lymphoma, unspecified site: Secondary | ICD-10-CM

## 2016-08-21 LAB — COMPREHENSIVE METABOLIC PANEL
ALT: 19 U/L (ref 0–55)
ANION GAP: 8 meq/L (ref 3–11)
AST: 19 U/L (ref 5–34)
Albumin: 3.2 g/dL — ABNORMAL LOW (ref 3.5–5.0)
Alkaline Phosphatase: 78 U/L (ref 40–150)
BUN: 13.8 mg/dL (ref 7.0–26.0)
CALCIUM: 8.9 mg/dL (ref 8.4–10.4)
CHLORIDE: 103 meq/L (ref 98–109)
CO2: 27 meq/L (ref 22–29)
Creatinine: 0.8 mg/dL (ref 0.6–1.1)
EGFR: 77 mL/min/{1.73_m2} — ABNORMAL LOW (ref >90)
Glucose: 151 mg/dl — ABNORMAL HIGH (ref 70–140)
POTASSIUM: 4.6 meq/L (ref 3.5–5.1)
Sodium: 138 mEq/L (ref 136–145)
Total Bilirubin: 0.32 mg/dL (ref 0.20–1.20)
Total Protein: 6.1 g/dL — ABNORMAL LOW (ref 6.4–8.3)

## 2016-08-21 LAB — CBC WITH DIFFERENTIAL/PLATELET
BASO%: 0.2 % (ref 0.0–2.0)
BASOS ABS: 0 10*3/uL (ref 0.0–0.1)
EOS%: 0.5 % (ref 0.0–7.0)
Eosinophils Absolute: 0 10*3/uL (ref 0.0–0.5)
HEMATOCRIT: 30.9 % — AB (ref 34.8–46.6)
HGB: 10.5 g/dL — ABNORMAL LOW (ref 11.6–15.9)
LYMPH#: 0.5 10*3/uL — AB (ref 0.9–3.3)
LYMPH%: 5.9 % — AB (ref 14.0–49.7)
MCH: 30.5 pg (ref 25.1–34.0)
MCHC: 34.1 g/dL (ref 31.5–36.0)
MCV: 89.3 fL (ref 79.5–101.0)
MONO#: 0.6 10*3/uL (ref 0.1–0.9)
MONO%: 7.7 % (ref 0.0–14.0)
NEUT#: 7 10*3/uL — ABNORMAL HIGH (ref 1.5–6.5)
NEUT%: 85.7 % — ABNORMAL HIGH (ref 38.4–76.8)
Platelets: 306 10*3/uL (ref 145–400)
RBC: 3.46 10*6/uL — AB (ref 3.70–5.45)
RDW: 13.6 % (ref 11.2–14.5)
WBC: 8.1 10*3/uL (ref 3.9–10.3)

## 2016-08-21 NOTE — Assessment & Plan Note (Signed)
Patient's potassium was down to 2.8 today.  She received potassium when she was at the [facility] today; and she was also prescribed potassium daily 20 mEq.  She was advised to start taking the potassium prescription tomorrow 08/18/2016.

## 2016-08-21 NOTE — Progress Notes (Signed)
SYMPTOM MANAGEMENT CLINIC    Chief Complaint: Esophagitis, dehydration, hypokalemia, UTI  HPI:  Kelly Ray 75 y.o. female diagnosed with lymphoma.  Currently undergoing R-CHOP chemotherapy.     Cancer ([place]) (Resolved)    Initial Diagnosis    Cancer ([place])       Review of Systems  Constitutional: Positive for malaise/fatigue and weight loss.  HENT: Positive for sore throat.   Genitourinary: Positive for dysuria and frequency.  All other systems reviewed and are negative.   Past Medical History:  Diagnosis Date  . Arthritis    Osteoarthritis  . B-cell lymphoma ([place]) 05/04/11   Large B-cell lymphoma  . Cancer ([place])    NHL  . Hx of radiation therapy 08/17/13- 09/03/13   right lower extremity- lymphoma  . Hypertension   . S/P knee replacement    Left knee  . Thyroid disease    Hyperthyroidism    Past Surgical History:  Procedure Laterality Date  . IR GENERIC HISTORICAL  07/13/2016   IR US GUIDE VASC ACCESS RIGHT 07/13/2016 [facility]-INTERV RAD  . IR GENERIC HISTORICAL  07/13/2016   IR FLUORO GUIDE PORT INSERTION RIGHT 07/13/2016 [facility]INTER[facility]  . NECK LESION BIOPSY Right 05/04/11   node biopsies  . 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    is allergic to lisinopril.  Allergies as of 08/17/2016      Reactions   Lisinopril Cough      Medication List       Accurate as of 08/17/16 11:59 PM. Always use your most recent med list.          ciprofloxacin 500 MG tablet Commonly known as:  CIPRO Take 1 tablet (500 mg total) by mouth 2 (two) times daily.   levothyroxine 112 MCG tablet Commonly known as:  SYNTHROID, LEVOTHROID Take 1 tablet daily   lidocaine-prilocaine  cream Commonly known as:  EMLA Apply 1 application topically as needed.   losartan-hydrochlorothiazide 50-12.5 MG tablet Commonly known as:  HYZAAR Take 1/2 tab PO QD in AM.   metoprolol tartrate 25 MG tablet Commonly known as:  LOPRESSOR Take 1 tablet (25 mg total) by mouth 2 (two) times daily.   naproxen sodium 220 MG tablet Commonly known as:  ANAPROX Take 220 mg by mouth 2 (two) times daily with a meal.   potassium chloride SA 20 MEQ tablet Commonly known as:  K-DUR,KLOR-CON Take 1 tablet (20 mEq total) by mouth daily.   pravastatin 40 MG tablet Commonly known as:  PRAVACHOL Take 1 tablet (40 mg total) by mouth at bedtime.   prochlorperazine 10 MG tablet Commonly known as:  COMPAZINE Take 1 tablet (10 mg total) by mouth every 6 (six) hours as needed for nausea or vomiting.   sucralfate 1 GM/10ML suspension Commonly known as:  CARAFATE Take 10 mLs (1 g total) by mouth 4 (four) times daily -  with meals and at bedtime.        PHYSICAL EXAMINATION  Oncology Vitals 08/17/2016 08/17/2016  Height - -  Weight - -  Weight (lbs) - -  BMI (kg/m2) - -  Temp - 97.7  Pulse 81 95  Resp - 18  Resp (Historical as of 03/27/12) - -  SpO2 - 100  BSA (m2) - -   BP Readings from Last 2 Encounters:  08/17/16 (!) 132/98  08/17/16 101/60    Physical Exam  Constitutional: She is oriented to person, place, and time. She appears malnourished and dehydrated. She appears unhealthy. She appears cachectic.  HENT:  Head: Normocephalic and atraumatic.  Mouth/Throat: Oropharynx is clear and moist.  Eyes: Conjunctivae and EOM are normal. Pupils are equal, round, and reactive to light. Right eye exhibits no discharge. Left eye exhibits no discharge. No scleral icterus.  Neck: Normal range of motion. Neck supple. No JVD present. No tracheal deviation present. No thyromegaly present.  Cardiovascular: Normal rate, regular rhythm, normal heart sounds and intact distal pulses.    Pulmonary/Chest: Effort normal and breath sounds normal. No respiratory distress. She has no wheezes. She has no rales. She exhibits no tenderness.  Abdominal: Soft. Bowel sounds are normal. She exhibits no distension and no mass. There is no tenderness. There is no rebound and no guarding.  Musculoskeletal: Normal range of motion. She exhibits no edema or tenderness.  Lymphadenopathy:    She has no cervical adenopathy.  Neurological: She is alert and oriented to person, place, and time. Gait normal.  Skin: Skin is warm and dry. No rash noted. No erythema. There is pallor.  Psychiatric: Affect normal.    LABORATORY DATA:. Orders Only on 08/17/2016  Component Date Value Ref Range Status  . Sodium 08/17/2016 131* 136 - 145 mEq/L Final  . Potassium 08/17/2016 3.2* 3.5 - 5.1 mEq/L Final  . Chloride 08/17/2016 95* 98 - 109 mEq/L Final  . CO2 08/17/2016 28  22 - 29 mEq/L Final  . Glucose 08/17/2016 147* 70 - 140 mg/dl Final  . BUN 08/17/2016 19.9  7.0 - 26.0 mg/dL Final  . Creatinine 08/17/2016 0.7  0.6 - 1.1 mg/dL Final  . Total Bilirubin 08/17/2016 0.65  0.20 - 1.20 mg/dL Final  . Alkaline Phosphatase 08/17/2016 71  40 - 150 U/L Final  . AST 08/17/2016 14  5 - 34 U/L Final  . ALT 08/17/2016 18  0 - 55 U/L Final  . Total Protein 08/17/2016 5.7* 6.4 - 8.3 g/dL Final  . Albumin 08/17/2016 2.9* 3.5 - 5.0 g/dL Final  . Calcium 08/17/2016 8.6  8.4 - 10.4 mg/dL Final  . Anion Gap 08/17/2016 8  3 - 11 mEq/L Final  . EGFR 08/17/2016 85* >90 ml/min/1.73 m2 Final  . Glucose 08/17/2016 Negative  Negative mg/dL Final  . Bilirubin (Urine) 08/17/2016 Negative  Negative Final  . Ketones 08/17/2016 Negative  Negative mg/dL Final  . Specific Gravity, Urine 08/17/2016 1.010  1.003 - 1.035 Final  . Blood 08/17/2016 Moderate  Negative Final  . pH 08/17/2016 7.5  4.6 - 8.0 Final  . Protein 08/17/2016 < 30  Negative- <30 mg/dL Final  . Urobilinogen, UR 08/17/2016 2  0.2 - 1 mg/dL Final  . Nitrite  08/17/2016 Negative  Negative Final  . Leukocyte Esterase 08/17/2016 Small  Negative Final  . RBC / HPF 08/17/2016 3-6  0 - 2 Final  . WBC, UA 08/17/2016 TNTC  0 - 2 Final  . Bacteria, UA 08/17/2016 Moderate  Negative- Trace Final  . Urine Culture, Routine 08/19/2016 Final report*  Final  . Urine Culture result 1 08/19/2016 Proteus mirabilis*  Final   Comment: Greater than 100,000 colony forming units per mL Cefazolin <=4 ug/mL Cefazolin with an MIC <=16 predicts susceptibility to the oral agents cefaclor, cefdinir, cefpodoxime, cefprozil, cefuroxime, cephalexin, and loracarbef when used for therapy  of uncomplicated urinary tract infections due to E. coli, Klebsiella pneumoniae, and Proteus mirabilis.   . ANTIMICROBIAL SUSCEPTIBILITY 08/19/2016 Comment   Final   Comment:       ** S = Susceptible; I = Intermediate; R = Resistant **                    P = Positive; N = Negative             MICS are expressed in micrograms per mL    Antibiotic                 RSLT#1    RSLT#2    RSLT#3    RSLT#4 Amoxicillin/Clavulanic Acid    S Ampicillin                     S Cefepime                       S Ceftriaxone                    S Cefuroxime                     S Cephalothin                    S Ciprofloxacin                  I Ertapenem                      S Gentamicin                     S Levofloxacin                   I Nitrofurantoin                 R Piperacillin                   S Tetracycline                   R Tobramycin                     S Trimethoprim/Sulfa             S   Appointment on 08/17/2016  Component Date Value Ref Range Status  . WBC 08/17/2016 1.5* 3.9 - 10.3 10e3/uL Final  . NEUT# 08/17/2016 1.1* 1.5 - 6.5 10e3/uL Final  . HGB 08/17/2016 11.4* 11.6 - 15.9 g/dL Final  . HCT 08/17/2016 33.9* 34.8 - 46.6 % Final  . Platelets 08/17/2016 67* 145 - 400 10e3/uL Final  . MCV 08/17/2016 87.1  79.5 - 101.0 fL Final  . MCH 08/17/2016 29.2  25.1 - 34.0 pg Final  .  MCHC 08/17/2016 33.6  31.5 - 36.0 g/dL Final  . RBC 08/17/2016 3.89  3.70 - 5.45 10e6/uL Final  . RDW 08/17/2016 13.3  11.2 - 14.5 % Final  . lymph# 08/17/2016 0.2* 0.9 - 3.3 10e3/uL Final  . MONO# 08/17/2016 0.3  0.1 - 0.9 10e3/uL Final  . Eosinophils Absolute 08/17/2016 0.0  0.0 - 0.5 10e3/uL Final  . Basophils Absolute 08/17/2016 0.0  0.0 - 0.1 10e3/uL Final  . NEUT% 08/17/2016 69.3  38.4 - 76.8 % Final  . LYMPH% 08/17/2016 11.9* 14.0 - 49.7 % Final  .  MONO% 08/17/2016 16.9* 0.0 - 14.0 % Final  . EOS% 08/17/2016 1.0  0.0 - 7.0 % Final  . BASO% 08/17/2016 0.9  0.0 - 2.0 % Final    RADIOGRAPHIC STUDIES: No results found.  ASSESSMENT/PLAN:    UTI (urinary tract infection) Patient states that she has had some intermittent, mild dysuria; and some urinary frequency as well.  Urinalysis obtained today.  Confirmed UTI.  Will prescribe Cipro antibiotics for treatment of UTI.  Hypotension Patient states that she has history of hypertension.  She has been taking Hyzaar and metoprolol.  She states that she takes the metoprolol for heart rate control.  She reports that she's been having lower blood pressure; it has been feeling dizzy when she changes positions quickly.  She also feels increasingly fatigued as well.  Initial blood pressure checked while at the [facility] revealed blood pressure 101/60.  Patient received IV fluid rehydration today.  She was also advised to cut the Hyzaar in half and only take one half of a tablet each morning.  She was encouraged to continue taking the metoprolol as directed; since it was prescribed for her for heart rate control.  Patient was advised to obtain a blood pressure cuff and to check her blood pressure at home.  She should keep a blood pressure log.  She should also follow-up with her family doctor regarding her blood pressure medications next week as well.  Hypokalemia Patient's potassium was down to 2.8 today.  She received potassium when she was  at the [facility] today; and she was also prescribed potassium daily 20 mEq.  She was advised to start taking the potassium prescription tomorrow 08/18/2016.  Esophagitis Patient states that she has some mouth sensitivity, and also a sore throat.  She has had decreased oral intake due to this complaint.  She is mildly dehydrated today.  Patient was advised that she should start taking Prilosec on a daily basis and was also given a prescription for Carafate to try as well.  She already has Magic mouthwash at home.  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  She scheduled to return for labs only on 08/21/2016.  She is scheduled to return on 08/28/2016 for labs, flush, visit, and her next cycle of chemotherapy  Dehydration Patient is suffering with some mild esophagitis/mucositis; and feels dehydrated.  She received IV fluid rehydration while cancer Center today.  She was also encouraged.  Push fluids is much as possible.  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  Blood counts obtained today revealed a PBC is 1.5, ANC is 1.1, hemoglobin is 11.4, and platelet count 67.  Patient was afebrile today.  We'll continue to monitor closely.   Patient stated understanding of all instructions; and was in agreement with this plan of care. The patient knows to call the clinic with any problems, questions or concerns.   Total time spent with patient was 40 minutes;  with greater than 75 percent of that time spent in face to face counseling regarding patient's symptoms,  and coordination of care and follow up.  Disclaimer:This dictation was prepared with Dragon/digital dictation along with [employer]. Any transcriptional errors that result from this process are unintentional.  Drue Second,  NP 08/21/2016

## 2016-08-21 NOTE — Assessment & Plan Note (Signed)
Patient states that she has some mouth sensitivity, and also a sore throat.  She has had decreased oral intake due to this complaint.  She is mildly dehydrated today.  Patient was advised that she should start taking Prilosec on a daily basis and was also given a prescription for Carafate to try as well.  She already has Magic mouthwash at home.

## 2016-08-21 NOTE — Telephone Encounter (Signed)
Patient here for lab only appt and waiting in lobby for results per lab.  Patient not found in lobby.  Call placed to pt.'s cell phone with no answer.  Call placed to pt.'s home phone and spoke with pt.'s husband to inform him that pt.'s lab results are within normal limits at this time.  Pt.'s husband states that pt is eating and drinking without difficulty and has no other complaints at this time.  Pt.'s husband appreciative of call and has no questions at this time.

## 2016-08-21 NOTE — Assessment & Plan Note (Signed)
Patient states that she has history of hypertension.  She has been taking Hyzaar and metoprolol.  She states that she takes the metoprolol for heart rate control.  She reports that she's been having lower blood pressure; it has been feeling dizzy when she changes positions quickly.  She also feels increasingly fatigued as well.  Initial blood pressure checked while at the [facility] revealed blood pressure 101/60.  Patient received IV fluid rehydration today.  She was also advised to cut the Hyzaar in half and only take one half of a tablet each morning.  She was encouraged to continue taking the metoprolol as directed; since it was prescribed for her for heart rate control.  Patient was advised to obtain a blood pressure cuff and to check her blood pressure at home.  She should keep a blood pressure log.  She should also follow-up with her family doctor regarding her blood pressure medications next week as well.

## 2016-08-21 NOTE — Assessment & Plan Note (Signed)
Patient is suffering with some mild esophagitis/mucositis; and feels dehydrated.  She received IV fluid rehydration while cancer [facility] today.  She was also encouraged.  Push fluids is much as possible.

## 2016-08-21 NOTE — Assessment & Plan Note (Addendum)
Patient received cycle 2 of her R-CHOP chemotherapy on 08/07/2016.  Blood counts obtained today revealed a PBC is 1.5, ANC is 1.1, hemoglobin is 11.4, and platelet count 67.  Patient was afebrile today.  We'll continue to monitor closely.

## 2016-08-21 NOTE — Assessment & Plan Note (Signed)
Patient received cycle 2 of her her-CHOP chemotherapy regimen on 08/07/2016.  She scheduled to return for labs only on 08/21/2016.  She is scheduled to return on 08/28/2016 for labs, flush, visit, and her next cycle of chemotherapy

## 2016-08-21 NOTE — Assessment & Plan Note (Signed)
Patient states that she has had some intermittent, mild dysuria; and some urinary frequency as well.  Urinalysis obtained today.  Confirmed UTI.  Will prescribe Cipro antibiotics for treatment of UTI.

## 2016-08-27 ENCOUNTER — Other Ambulatory Visit: Payer: Self-pay | Admitting: Oncology

## 2016-08-28 ENCOUNTER — Telehealth: Payer: Self-pay | Admitting: Nurse Practitioner

## 2016-08-28 ENCOUNTER — Ambulatory Visit (HOSPITAL_BASED_OUTPATIENT_CLINIC_OR_DEPARTMENT_OTHER): Payer: Medicare Other | Admitting: Nurse Practitioner

## 2016-08-28 ENCOUNTER — Ambulatory Visit: Payer: Medicare Other

## 2016-08-28 ENCOUNTER — Telehealth: Payer: Self-pay | Admitting: *Deleted

## 2016-08-28 ENCOUNTER — Ambulatory Visit (HOSPITAL_BASED_OUTPATIENT_CLINIC_OR_DEPARTMENT_OTHER): Payer: Medicare Other

## 2016-08-28 ENCOUNTER — Other Ambulatory Visit (HOSPITAL_BASED_OUTPATIENT_CLINIC_OR_DEPARTMENT_OTHER): Payer: Medicare Other

## 2016-08-28 VITALS — BP 119/73 | HR 68 | Temp 98.7°F | Resp 18 | Ht 61.0 in | Wt 185.2 lb

## 2016-08-28 VITALS — BP 98/64 | HR 83 | Temp 98.7°F | Resp 18

## 2016-08-28 DIAGNOSIS — R070 Pain in throat: Secondary | ICD-10-CM | POA: Diagnosis not present

## 2016-08-28 DIAGNOSIS — C8335 Diffuse large B-cell lymphoma, lymph nodes of inguinal region and lower limb: Secondary | ICD-10-CM

## 2016-08-28 DIAGNOSIS — Z5112 Encounter for antineoplastic immunotherapy: Secondary | ICD-10-CM

## 2016-08-28 DIAGNOSIS — Z95828 Presence of other vascular implants and grafts: Secondary | ICD-10-CM

## 2016-08-28 DIAGNOSIS — C8330 Diffuse large B-cell lymphoma, unspecified site: Secondary | ICD-10-CM

## 2016-08-28 DIAGNOSIS — Z5111 Encounter for antineoplastic chemotherapy: Secondary | ICD-10-CM

## 2016-08-28 DIAGNOSIS — R1310 Dysphagia, unspecified: Secondary | ICD-10-CM | POA: Diagnosis not present

## 2016-08-28 LAB — CBC WITH DIFFERENTIAL/PLATELET
BASO%: 0.7 % (ref 0.0–2.0)
Basophils Absolute: 0 10*3/uL (ref 0.0–0.1)
EOS ABS: 0 10*3/uL (ref 0.0–0.5)
EOS%: 0.2 % (ref 0.0–7.0)
HCT: 30.8 % — ABNORMAL LOW (ref 34.8–46.6)
HGB: 10.2 g/dL — ABNORMAL LOW (ref 11.6–15.9)
LYMPH%: 11.9 % — AB (ref 14.0–49.7)
MCH: 30 pg (ref 25.1–34.0)
MCHC: 33.1 g/dL (ref 31.5–36.0)
MCV: 90.6 fL (ref 79.5–101.0)
MONO#: 0.7 10*3/uL (ref 0.1–0.9)
MONO%: 12.9 % (ref 0.0–14.0)
NEUT%: 74.3 % (ref 38.4–76.8)
NEUTROS ABS: 4 10*3/uL (ref 1.5–6.5)
Platelets: 433 10*3/uL — ABNORMAL HIGH (ref 145–400)
RBC: 3.4 10*6/uL — AB (ref 3.70–5.45)
RDW: 15.8 % — ABNORMAL HIGH (ref 11.2–14.5)
WBC: 5.4 10*3/uL (ref 3.9–10.3)
lymph#: 0.6 10*3/uL — ABNORMAL LOW (ref 0.9–3.3)

## 2016-08-28 MED ORDER — SODIUM CHLORIDE 0.9 % IV SOLN
2.0000 mg | Freq: Once | INTRAVENOUS | Status: AC
  Administered 2016-08-28: 2 mg via INTRAVENOUS
  Filled 2016-08-28: qty 2

## 2016-08-28 MED ORDER — SODIUM CHLORIDE 0.9 % IV SOLN
Freq: Once | INTRAVENOUS | Status: AC
  Administered 2016-08-28: 11:00:00 via INTRAVENOUS

## 2016-08-28 MED ORDER — LACTATED RINGERS IV SOLN
515.0000 mg/m2 | Freq: Once | INTRAVENOUS | Status: AC
  Administered 2016-08-28: 1000 mg via INTRAVENOUS
  Filled 2016-08-28: qty 100

## 2016-08-28 MED ORDER — SODIUM CHLORIDE 0.9% FLUSH
10.0000 mL | INTRAVENOUS | Status: DC | PRN
  Administered 2016-08-28: 10 mL
  Filled 2016-08-28: qty 10

## 2016-08-28 MED ORDER — SODIUM CHLORIDE 0.9 % IV SOLN
375.0000 mg/m2 | Freq: Once | INTRAVENOUS | Status: AC
  Administered 2016-08-28: 700 mg via INTRAVENOUS
  Filled 2016-08-28: qty 50

## 2016-08-28 MED ORDER — PALONOSETRON HCL INJECTION 0.25 MG/5ML
0.2500 mg | Freq: Once | INTRAVENOUS | Status: AC
  Administered 2016-08-28: 0.25 mg via INTRAVENOUS

## 2016-08-28 MED ORDER — ACETAMINOPHEN 325 MG PO TABS
ORAL_TABLET | ORAL | Status: AC
  Filled 2016-08-28: qty 2

## 2016-08-28 MED ORDER — SODIUM CHLORIDE 0.9 % IV SOLN
750.0000 mg/m2 | Freq: Once | INTRAVENOUS | Status: AC
  Administered 2016-08-28: 1460 mg via INTRAVENOUS
  Filled 2016-08-28: qty 73

## 2016-08-28 MED ORDER — DEXAMETHASONE SODIUM PHOSPHATE 10 MG/ML IJ SOLN
INTRAMUSCULAR | Status: AC
  Filled 2016-08-28: qty 1

## 2016-08-28 MED ORDER — HEPARIN SOD (PORK) LOCK FLUSH 100 UNIT/ML IV SOLN
500.0000 [IU] | Freq: Once | INTRAVENOUS | Status: AC | PRN
  Administered 2016-08-28: 500 [IU]
  Filled 2016-08-28: qty 5

## 2016-08-28 MED ORDER — PALONOSETRON HCL INJECTION 0.25 MG/5ML
INTRAVENOUS | Status: AC
  Filled 2016-08-28: qty 5

## 2016-08-28 MED ORDER — DOXORUBICIN HCL CHEMO IV INJECTION 2 MG/ML
51.0000 mg/m2 | Freq: Once | INTRAVENOUS | Status: AC
  Administered 2016-08-28: 100 mg via INTRAVENOUS
  Filled 2016-08-28: qty 50

## 2016-08-28 MED ORDER — DIPHENHYDRAMINE HCL 25 MG PO CAPS
50.0000 mg | ORAL_CAPSULE | Freq: Once | ORAL | Status: AC
  Administered 2016-08-28: 50 mg via ORAL

## 2016-08-28 MED ORDER — DIPHENHYDRAMINE HCL 25 MG PO CAPS
ORAL_CAPSULE | ORAL | Status: AC
  Filled 2016-08-28: qty 2

## 2016-08-28 MED ORDER — DEXAMETHASONE SODIUM PHOSPHATE 10 MG/ML IJ SOLN
10.0000 mg | Freq: Once | INTRAMUSCULAR | Status: AC
  Administered 2016-08-28: 10 mg via INTRAVENOUS

## 2016-08-28 MED ORDER — ACETAMINOPHEN 325 MG PO TABS
650.0000 mg | ORAL_TABLET | Freq: Once | ORAL | Status: AC
  Administered 2016-08-28: 650 mg via ORAL

## 2016-08-28 MED ORDER — SODIUM CHLORIDE 0.9% FLUSH
10.0000 mL | INTRAVENOUS | Status: DC | PRN
  Administered 2016-08-28: 10 mL via INTRAVENOUS
  Filled 2016-08-28: qty 10

## 2016-08-28 MED ORDER — PEGFILGRASTIM 6 MG/0.6ML ~~LOC~~ PSKT
6.0000 mg | PREFILLED_SYRINGE | Freq: Once | SUBCUTANEOUS | Status: AC
  Administered 2016-08-28: 6 mg via SUBCUTANEOUS
  Filled 2016-08-28: qty 0.6

## 2016-08-28 NOTE — Telephone Encounter (Signed)
Per LOS I have scheduled appt and gave calendar

## 2016-08-28 NOTE — Patient Instructions (Signed)
[facility] Discharge Instructions for Patients Receiving Chemotherapy  Today you received the following chemotherapy agents Zinecard, Adriamycin, Vincristine, Cytoxan and Rituxan   To help prevent nausea and vomiting after your treatment, we encourage you to take your nausea medication as directed. No Zofran for 3 days.    If you develop nausea and vomiting that is not controlled by your nausea medication, call the clinic.   BELOW ARE SYMPTOMS THAT SHOULD BE REPORTED IMMEDIATELY:  *FEVER GREATER THAN 100.5 F  *CHILLS WITH OR WITHOUT FEVER  NAUSEA AND VOMITING THAT IS NOT CONTROLLED WITH YOUR NAUSEA MEDICATION  *UNUSUAL SHORTNESS OF BREATH  *UNUSUAL BRUISING OR BLEEDING  TENDERNESS IN MOUTH AND THROAT WITH OR WITHOUT PRESENCE OF ULCERS  *URINARY PROBLEMS  *BOWEL PROBLEMS  UNUSUAL RASH Items with * indicate a potential emergency and should be followed up as soon as possible.  Feel free to call the clinic you have any questions or concerns. The clinic phone number is [phone].  Please show the [place] at check-in to the Emergency Department and triage nurse.

## 2016-08-28 NOTE — Progress Notes (Signed)
[facility] OFFICE PROGRESS NOTE   Diagnosis:  Non-Hodgkin's lymphoma  INTERVAL HISTORY:   Ms. Swaminathan returns as scheduled. She completed cycle 2 CHOP/Rituxan on 08/28/2016. She denies nausea/vomiting. No mouth sores. She developed a sore throat and dysphagia following the chemotherapy. She had trouble eating. Symptoms lasted for about 10 days. She noted improvement with Carafate and Nexium. No diarrhea. No numbness or tingling in her hands or feet. She had no issues with the Neulasta injection.  Objective:  Vital signs in last 24 hours:  Blood pressure 119/73, pulse 68, temperature 98.7 F (37.1 C), temperature source Oral, resp. rate 18, height '5\' 1"'$  (1.549 m), weight 185 lb 3.2 oz (84 kg), SpO2 98 %.    HEENT: No thrush or ulcers. Resp: Lungs clear bilaterally. Cardio: Regular rate and rhythm. GI: Abdomen soft and nontender. No organomegaly. Vascular: No leg edema. Skin: Dominant lesion at the right medial calf is smaller. Previously noted smaller lesions at the right calf are no longer palpable. Port-A-Cath without erythema.    Lab Results:  Lab Results  Component Value Date   WBC 5.4 08/28/2016   HGB 10.2 (L) 08/28/2016   HCT 30.8 (L) 08/28/2016   MCV 90.6 08/28/2016   PLT 433 (H) 08/28/2016   NEUTROABS 4.0 08/28/2016    Imaging:  No results found.  Medications: I have reviewed the patient's current medications.  Assessment/Plan: 1. 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 Negative restaging CT evaluation 10/24/2012  Nodular skin lesions at the right lower leg, status post a shave biopsy 07/15/2013 confirming a malignant B-cell lymphoma, diffuse large cell positive for CD20, BCL 6, BCL 2, and CD10   Staging bone marrow biopsy 07/31/2013, negative for involvement with lymphoma   Staging PET scan 07/30/2013-negative.   Status post  palliative radiation right lower leg nodular skin lesions 08/17/2013 through 09/03/2013.  New nodular skin lesions at the right lower leg April 2017, status postpsies of lesions at the right lower leg 12/07/2015 confirming large B-cell lymphoma, CD20 positive bio  staging PET scan 12/26/2015-hypermetabolic cutaneous and subcutaneous nodules in the right lower extremity, no other evidence of lymphoma  Cycle 1 bendamustine/Rituxan 01/03/2016  Cycle 2 bendamustine/Rituxan 01/31/2016  Clinical progression with enlargement of multiple right lower leg skin lesions 02/23/2016  Status post radiation right lower leg 03/14/2016 through 03/29/2016  Clinical progression with new and enlarging skin lesions right lower leg 07/06/2016  PET scan 07/12/2016-clear interval increase in size and metabolic activity of a large nodule posterior right calf and a smaller subcutaneous nodule medial right thigh; several more superficial right lower extremity lesions improved with reduction in size and metabolic activity; single focus of metabolic activity associated with the medial right clavicle with bonychange  Cycle 1 CHOP/Rituxan 07/17/2016  Cycle 2 CHOP/Rituxan 08/07/2016-Neulasta added  Cycle 3 CHOP/Rituxan 08/28/2016 with Neulasta support 2. Port-A-Cath placement 06/01/2011. Port-A-Cath removal 11/04/2012 3. Hypertension. 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 5. Hypercholesterolemia. 6. Osteoarthritis. 7. Malaise-? Secondary to progression of non-Hodgkin's lymphoma. Improved. 8. Staph infection right lower leg February 2015. 9. Right calf lesion with purulent drainage 02/09/2016, culture obtained-group B strep, doxycycline prescribed 10. Port-A-Cath placement 07/13/2016 11. 2-D echo 07/10/2016-estimated ejection fraction range of 55-60% 12. 07/17/2016 hepatitis Bsurface antigen negative, core antibody positive 13. Neutropenia secondary to chemotherapy 07/27/2016 14. Thrombocytopenia secondary to  chemotherapy 07/27/2016 15. Sore throat/dysphagia following cycle 2 CHOP/Rituxan. Question esophagitis.   Disposition: Ms. Kittleson appears stable. She  has completed 2 cycles of CHOP/Rituxan. The dominant right calf lesion continues to improve. The smaller lesions have resolved. Plan to proceed with cycle 3 CHOP/Rituxan today as scheduled. We are referring her for a restaging PET scan and follow-up 2-D echo in 2 weeks.  She developed a sore throat and dysphagia following cycle 2 CHOP/Rituxan. This may have been esophagitis related to the chemotherapy. She will contact the office if symptoms recur following cycle 3.  She will return for a follow-up visit and cycle 4 CHOP/Rituxan in 3 weeks. She will contact the office in the interim as outlined above or with any other problems.  Patient seen with Dr. Benay Spice. 25 minutes were spent face-to-face at today's visit with the majority of that time involved in counseling/coordination of care.    Ned Card ANP/GNP-BC   08/28/2016  10:10 AM  This was a shared visit with Ned Card. Ms. Nangle was interviewed and examined. She will complete cycle 3 CHOP/rituximab today. She will undergo a restaging PET scan after this cycle.  Julieanne Manson, M.D.

## 2016-08-28 NOTE — Telephone Encounter (Signed)
Appointments scheduled per 1/2 LOS. Patient given AVS report and calendars with future scheduled appointments. °

## 2016-09-05 ENCOUNTER — Telehealth: Payer: Self-pay | Admitting: *Deleted

## 2016-09-05 NOTE — Telephone Encounter (Signed)
Pt states she continues to feel tired. Was wondering if that is normal. Had treatment last Tuesday with neulasta on Wednesday. States she is drinking plenty of fluids including boost, is also eating 3 meals per day despite "no taste". Reports that mouth sores have healed and took 7 days of potassium as directed from visit with Selena Lesser in December.  Encouraged patient to continue to drink fluids and rest. To call us on Friday morning if fatigue does not resolve

## 2016-09-14 ENCOUNTER — Encounter (HOSPITAL_COMMUNITY)
Admission: RE | Admit: 2016-09-14 | Discharge: 2016-09-14 | Disposition: A | Discharge status: Home / Self Care | Payer: Medicare Other | Source: Ambulatory Visit | Attending: Nurse Practitioner | Admitting: Nurse Practitioner

## 2016-09-14 DIAGNOSIS — C8335 Diffuse large B-cell lymphoma, lymph nodes of inguinal region and lower limb: Secondary | ICD-10-CM | POA: Insufficient documentation

## 2016-09-14 LAB — GLUCOSE, CAPILLARY: GLUCOSE-CAPILLARY: 115 mg/dL — AB (ref 65–99)

## 2016-09-14 MED ORDER — FLUDEOXYGLUCOSE F - 18 (FDG) INJECTION
9.1700 | Freq: Once | INTRAVENOUS | Status: AC | PRN
  Administered 2016-09-14: 9.17 via INTRAVENOUS

## 2016-09-16 ENCOUNTER — Other Ambulatory Visit: Payer: Self-pay | Admitting: Oncology

## 2016-09-18 ENCOUNTER — Ambulatory Visit (HOSPITAL_BASED_OUTPATIENT_CLINIC_OR_DEPARTMENT_OTHER): Payer: Medicare Other

## 2016-09-18 ENCOUNTER — Ambulatory Visit: Payer: Medicare Other

## 2016-09-18 ENCOUNTER — Ambulatory Visit (HOSPITAL_BASED_OUTPATIENT_CLINIC_OR_DEPARTMENT_OTHER): Payer: Medicare Other | Admitting: Nurse Practitioner

## 2016-09-18 ENCOUNTER — Other Ambulatory Visit (HOSPITAL_BASED_OUTPATIENT_CLINIC_OR_DEPARTMENT_OTHER): Payer: Medicare Other

## 2016-09-18 VITALS — BP 125/75 | HR 67 | Temp 98.7°F | Resp 18 | Ht 61.0 in | Wt 184.0 lb

## 2016-09-18 VITALS — BP 101/61 | HR 70 | Temp 98.5°F | Resp 16

## 2016-09-18 DIAGNOSIS — C8335 Diffuse large B-cell lymphoma, lymph nodes of inguinal region and lower limb: Secondary | ICD-10-CM

## 2016-09-18 DIAGNOSIS — Z5111 Encounter for antineoplastic chemotherapy: Secondary | ICD-10-CM

## 2016-09-18 DIAGNOSIS — Z5112 Encounter for antineoplastic immunotherapy: Secondary | ICD-10-CM | POA: Diagnosis not present

## 2016-09-18 DIAGNOSIS — Z95828 Presence of other vascular implants and grafts: Secondary | ICD-10-CM

## 2016-09-18 DIAGNOSIS — C8330 Diffuse large B-cell lymphoma, unspecified site: Secondary | ICD-10-CM

## 2016-09-18 LAB — COMPREHENSIVE METABOLIC PANEL
ALBUMIN: 3.3 g/dL — AB (ref 3.5–5.0)
ALK PHOS: 62 U/L (ref 40–150)
ALT: 12 U/L (ref 0–55)
ANION GAP: 7 meq/L (ref 3–11)
AST: 20 U/L (ref 5–34)
BUN: 16 mg/dL (ref 7.0–26.0)
CO2: 25 mEq/L (ref 22–29)
Calcium: 8.7 mg/dL (ref 8.4–10.4)
Chloride: 107 mEq/L (ref 98–109)
Creatinine: 0.6 mg/dL (ref 0.6–1.1)
EGFR: 88 mL/min/{1.73_m2} — AB (ref >90)
GLUCOSE: 89 mg/dL (ref 70–140)
POTASSIUM: 4 meq/L (ref 3.5–5.1)
SODIUM: 140 meq/L (ref 136–145)
Total Bilirubin: 0.5 mg/dL (ref 0.20–1.20)
Total Protein: 5.7 g/dL — ABNORMAL LOW (ref 6.4–8.3)

## 2016-09-18 LAB — CBC WITH DIFFERENTIAL/PLATELET
BASO%: 1 % (ref 0.0–2.0)
BASOS ABS: 0 10*3/uL (ref 0.0–0.1)
EOS ABS: 0 10*3/uL (ref 0.0–0.5)
EOS%: 0.1 % (ref 0.0–7.0)
HCT: 27 % — ABNORMAL LOW (ref 34.8–46.6)
HEMOGLOBIN: 9.1 g/dL — AB (ref 11.6–15.9)
LYMPH%: 16.7 % (ref 14.0–49.7)
MCH: 31.3 pg (ref 25.1–34.0)
MCHC: 33.7 g/dL (ref 31.5–36.0)
MCV: 93 fL (ref 79.5–101.0)
MONO#: 0.6 10*3/uL (ref 0.1–0.9)
MONO%: 19 % — ABNORMAL HIGH (ref 0.0–14.0)
NEUT#: 2.2 10*3/uL (ref 1.5–6.5)
NEUT%: 63.2 % (ref 38.4–76.8)
PLATELETS: 318 10*3/uL (ref 145–400)
RBC: 2.9 10*6/uL — AB (ref 3.70–5.45)
RDW: 19.9 % — ABNORMAL HIGH (ref 11.2–14.5)
WBC: 3.4 10*3/uL — ABNORMAL LOW (ref 3.9–10.3)
lymph#: 0.6 10*3/uL — ABNORMAL LOW (ref 0.9–3.3)

## 2016-09-18 MED ORDER — DIPHENHYDRAMINE HCL 25 MG PO CAPS
50.0000 mg | ORAL_CAPSULE | Freq: Once | ORAL | Status: AC
  Administered 2016-09-18: 50 mg via ORAL

## 2016-09-18 MED ORDER — VINCRISTINE SULFATE CHEMO INJECTION 1 MG/ML
2.0000 mg | Freq: Once | INTRAVENOUS | Status: AC
  Administered 2016-09-18: 2 mg via INTRAVENOUS
  Filled 2016-09-18: qty 2

## 2016-09-18 MED ORDER — SODIUM CHLORIDE 0.9 % IV SOLN
750.0000 mg/m2 | Freq: Once | INTRAVENOUS | Status: AC
  Administered 2016-09-18: 1460 mg via INTRAVENOUS
  Filled 2016-09-18: qty 73

## 2016-09-18 MED ORDER — PALONOSETRON HCL INJECTION 0.25 MG/5ML
0.2500 mg | Freq: Once | INTRAVENOUS | Status: AC
  Administered 2016-09-18: 0.25 mg via INTRAVENOUS

## 2016-09-18 MED ORDER — DEXAMETHASONE SODIUM PHOSPHATE 10 MG/ML IJ SOLN
10.0000 mg | Freq: Once | INTRAMUSCULAR | Status: AC
  Administered 2016-09-18: 10 mg via INTRAVENOUS

## 2016-09-18 MED ORDER — SODIUM CHLORIDE 0.9% FLUSH
10.0000 mL | INTRAVENOUS | Status: DC | PRN
  Administered 2016-09-18: 10 mL
  Filled 2016-09-18: qty 10

## 2016-09-18 MED ORDER — ETOPOSIDE CHEMO INJECTION 1 GM/50ML
50.0000 mg/m2 | Freq: Once | INTRAVENOUS | Status: AC
  Administered 2016-09-18: 100 mg via INTRAVENOUS
  Filled 2016-09-18: qty 5

## 2016-09-18 MED ORDER — SODIUM CHLORIDE 0.9 % IV SOLN
Freq: Once | INTRAVENOUS | Status: AC
  Administered 2016-09-18: 10:00:00 via INTRAVENOUS

## 2016-09-18 MED ORDER — ETOPOSIDE CHEMO INJECTION 1 GM/50ML
50.0000 mg/m2 | Freq: Once | INTRAVENOUS | Status: DC
  Filled 2016-09-18: qty 5

## 2016-09-18 MED ORDER — SODIUM CHLORIDE 0.9% FLUSH
10.0000 mL | INTRAVENOUS | Status: DC | PRN
  Administered 2016-09-18: 10 mL via INTRAVENOUS
  Filled 2016-09-18: qty 10

## 2016-09-18 MED ORDER — ACETAMINOPHEN 325 MG PO TABS
ORAL_TABLET | ORAL | Status: AC
Start: 2016-09-18 — End: 2016-09-18
  Filled 2016-09-18: qty 2

## 2016-09-18 MED ORDER — PALONOSETRON HCL INJECTION 0.25 MG/5ML
INTRAVENOUS | Status: AC
  Filled 2016-09-18: qty 5

## 2016-09-18 MED ORDER — DIPHENHYDRAMINE HCL 25 MG PO CAPS
ORAL_CAPSULE | ORAL | Status: AC
  Filled 2016-09-18: qty 2

## 2016-09-18 MED ORDER — ACETAMINOPHEN 325 MG PO TABS
650.0000 mg | ORAL_TABLET | Freq: Once | ORAL | Status: AC
  Administered 2016-09-18: 650 mg via ORAL

## 2016-09-18 MED ORDER — SODIUM CHLORIDE 0.9 % IV SOLN
375.0000 mg/m2 | Freq: Once | INTRAVENOUS | Status: AC
  Administered 2016-09-18: 700 mg via INTRAVENOUS
  Filled 2016-09-18: qty 50

## 2016-09-18 MED ORDER — DEXAMETHASONE SODIUM PHOSPHATE 10 MG/ML IJ SOLN
INTRAMUSCULAR | Status: AC
  Filled 2016-09-18: qty 1

## 2016-09-18 MED ORDER — HEPARIN SOD (PORK) LOCK FLUSH 100 UNIT/ML IV SOLN
500.0000 [IU] | Freq: Once | INTRAVENOUS | Status: AC | PRN
  Administered 2016-09-18: 500 [IU]
  Filled 2016-09-18: qty 5

## 2016-09-18 NOTE — Patient Instructions (Signed)

## 2016-09-18 NOTE — Patient Instructions (Signed)
[facility] Discharge Instructions for Patients Receiving Chemotherapy  Today you received the following chemotherapy agents etoposide/vincristine/cytoxan/rituxan  To help prevent nausea and vomiting after your treatment, we encourage you to take your nausea medication as directed  If you develop nausea and vomiting that is not controlled by your nausea medication, call the clinic.   BELOW ARE SYMPTOMS THAT SHOULD BE REPORTED IMMEDIATELY:  *FEVER GREATER THAN 100.5 F  *CHILLS WITH OR WITHOUT FEVER  NAUSEA AND VOMITING THAT IS NOT CONTROLLED WITH YOUR NAUSEA MEDICATION  *UNUSUAL SHORTNESS OF BREATH  *UNUSUAL BRUISING OR BLEEDING  TENDERNESS IN MOUTH AND THROAT WITH OR WITHOUT PRESENCE OF ULCERS  *URINARY PROBLEMS  *BOWEL PROBLEMS  UNUSUAL RASH Items with * indicate a potential emergency and should be followed up as soon as possible.  Feel free to call the clinic you have any questions or concerns. The clinic phone number is [phone].

## 2016-09-18 NOTE — Addendum Note (Signed)
Addended by: Neysa Hotter on: 09/18/2016 12:31 PM   Modules accepted: Orders

## 2016-09-18 NOTE — Progress Notes (Addendum)
[facility] OFFICE PROGRESS NOTE   Diagnosis:  Non-Hodgkin's lymphoma  INTERVAL HISTORY:   Kelly Ray returns as scheduled. She completed cycle 3 CHOP/Rituxan 08/28/2016. She denies nausea/vomiting. No mouth sores. No sore throat. No dysphagia. No constipation or diarrhea. No numbness or tingling in her hands or feet. She noted a decrease in appetite. She is eating small frequent meals. She denies shortness of breath. No chest pain. No leg swelling.  Objective:  Vital signs in last 24 hours:  Blood pressure 125/75, pulse 67, temperature 98.7 F (37.1 C), temperature source Oral, resp. rate 18, height 5\' 1"  (1.549 m), weight 184 lb (83.5 kg), SpO2 99 %.    HEENT: No thrush or ulcers. Resp: Lungs clear bilaterally. Cardio: Regular rate and rhythm. GI: Abdomen soft and nontender. No organomegaly. Vascular: No leg edema. Neuro: Vibratory sense mildly decreased over the fingertips per tuning fork exam.  Skin: Dominant lesion right medial calf is smaller. Previously noted smaller lesions at the right calf are no longer palpable. No palpable abnormality over the left posterior chest. Port-A-Cath without erythema.    Lab Results:  Lab Results  Component Value Date   WBC 3.4 (L) 09/18/2016   HGB 9.1 (L) 09/18/2016   HCT 27.0 (L) 09/18/2016   MCV 93.0 09/18/2016   PLT 318 09/18/2016   NEUTROABS 2.2 09/18/2016    Imaging:  No results found.  Medications: I have reviewed the patient's current medications.  Assessment/Plan: 1. 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 Negative restaging CT evaluation 10/24/2012  Nodular skin lesions at the right lower leg, status post a shave biopsy 07/15/2013 confirming a malignant B-cell lymphoma, diffuse large cell positive for CD20, BCL 6, BCL 2, and CD10   Staging bone marrow biopsy 07/31/2013, negative for involvement  with lymphoma   Staging PET scan 07/30/2013-negative.   Status post palliative radiation right lower leg nodular skin lesions 08/17/2013 through 09/03/2013.  New nodular skin lesions at the right lower leg April 2017, status postpsies of lesions at the right lower leg 12/07/2015 confirming large B-cell lymphoma, CD20 positive bio  staging PET scan 12/26/2015-hypermetabolic cutaneous and subcutaneous nodules in the right lower extremity, no other evidence of lymphoma  Cycle 1 bendamustine/Rituxan 01/03/2016  Cycle 2 bendamustine/Rituxan 01/31/2016  Clinical progression with enlargement of multiple right lower leg skin lesions 02/23/2016  Status post radiation right lower leg 03/14/2016 through 03/29/2016  Clinical progression with new and enlarging skin lesions right lower leg 07/06/2016  PET scan 07/12/2016-clear interval increase in size and metabolic activity of a large nodule posterior right calf and a smaller subcutaneous nodule medial right thigh; several more superficial right lower extremity lesions improved with reduction in size and metabolic activity; single focus of metabolic activity associated with the medial right clavicle with bonychange  Cycle 1 CHOP/Rituxan 07/17/2016  Cycle 2 CHOP/Rituxan 08/07/2016-Neulasta added  Cycle 3 CHOP/Rituxan 08/28/2016 with Neulasta support  Restaging PET scan 09/14/2016-hypermetabolic subcutaneous nodules previously demonstrated within the right thigh and right calf have nearly resolved. There is some residual activity within the calf which could be postsurgical. Small focus of activity within the left latissimus dorsi muscle within the posterior chest wall without corresponding CT abnormality.  Cycle 4 CHOP/Rituxan 09/18/2016 (Adriamycin deleted, etoposide substituted daily 3) 2. Port-A-Cath placement 06/01/2011. Port-A-Cath removal 11/04/2012 3. Hypertension. 4. Hypothyroid on  replacement. 5. Hypercholesterolemia. 6. Osteoarthritis. 7. Malaise-? Secondary to progression of non-Hodgkin's lymphoma. Improved. 8.  Staph infection right lower leg February 2015. 9. Right calf lesion with purulent drainage 02/09/2016, culture obtained-group B strep, doxycycline prescribed 10. Port-A-Cath placement 07/13/2016 11. 2-D echo 07/10/2016-estimated ejection fraction range of 55-60% 12. 07/17/2016 hepatitis Bsurface antigen negative, core antibody positive 13. Neutropenia secondary to chemotherapy 07/27/2016 14. Thrombocytopenia secondary to chemotherapy 07/27/2016 15. Sore throat/dysphagia following cycle 2 CHOP/Rituxan. Question esophagitis.   Disposition: Kelly Ray appears stable. She has completed 3 cycles of CHOP/Rituxan. PET scan confirms improvement. Plan to proceed with cycle 4 today as scheduled. Adriamycin will be deleted from the regimen and etoposide substituted daily 3. She will receive Neulasta on day 4. She understands the rationale for discontinuing Adriamycin and substituting etoposide and is in agreement with this plan.  She will return for a follow-up visit and cycle 4 in 3 weeks. She will contact the office in the interim with any problems.  Patient seen with Dr. Benay Spice. 25 minutes were spent face-to-face at today's visit with the majority of that time involved in counseling/coordination of care.    Ned Card ANP/GNP-BC   09/18/2016  9:37 AM  this was a shared visit with Ned Card. Kelly Ray was interviewed and examined. There has  Marked clinical and radiologic improvement with 3 cycles of CHOP/rituximab Doxorubicin from further chemotherapy secondary to the cumulative dose.Etoposide will be substituted.she will complete cycle 4 chemotherapy beginning today.We will give day2 and day 3 etoposide orally beginning with cycle 5.  Julieanne Manson, M.D.

## 2016-09-19 ENCOUNTER — Ambulatory Visit (HOSPITAL_COMMUNITY)
Admission: RE | Admit: 2016-09-19 | Discharge: 2016-09-19 | Disposition: A | Discharge status: Home / Self Care | Payer: Medicare Other | Source: Ambulatory Visit | Attending: Nurse Practitioner | Admitting: Nurse Practitioner

## 2016-09-19 ENCOUNTER — Ambulatory Visit (HOSPITAL_BASED_OUTPATIENT_CLINIC_OR_DEPARTMENT_OTHER): Payer: Medicare Other

## 2016-09-19 VITALS — BP 110/87 | HR 63 | Temp 98.1°F | Resp 16

## 2016-09-19 DIAGNOSIS — C8335 Diffuse large B-cell lymphoma, lymph nodes of inguinal region and lower limb: Secondary | ICD-10-CM | POA: Diagnosis not present

## 2016-09-19 DIAGNOSIS — Z5111 Encounter for antineoplastic chemotherapy: Secondary | ICD-10-CM

## 2016-09-19 DIAGNOSIS — C8330 Diffuse large B-cell lymphoma, unspecified site: Secondary | ICD-10-CM | POA: Diagnosis not present

## 2016-09-19 DIAGNOSIS — Z95828 Presence of other vascular implants and grafts: Secondary | ICD-10-CM

## 2016-09-19 MED ORDER — PROCHLORPERAZINE MALEATE 10 MG PO TABS
ORAL_TABLET | ORAL | Status: AC
  Filled 2016-09-19: qty 1

## 2016-09-19 MED ORDER — SODIUM CHLORIDE 0.9 % IV SOLN
50.0000 mg/m2 | Freq: Once | INTRAVENOUS | Status: AC
  Administered 2016-09-19: 100 mg via INTRAVENOUS
  Filled 2016-09-19: qty 5

## 2016-09-19 MED ORDER — SODIUM CHLORIDE 0.9% FLUSH
10.0000 mL | INTRAVENOUS | Status: DC | PRN
  Administered 2016-09-19 (×2): 10 mL via INTRAVENOUS
  Filled 2016-09-19: qty 10

## 2016-09-19 MED ORDER — HEPARIN SOD (PORK) LOCK FLUSH 100 UNIT/ML IV SOLN
500.0000 [IU] | Freq: Once | INTRAVENOUS | Status: AC | PRN
  Administered 2016-09-19: 500 [IU] via INTRAVENOUS
  Filled 2016-09-19: qty 5

## 2016-09-19 MED ORDER — PROCHLORPERAZINE MALEATE 10 MG PO TABS
10.0000 mg | ORAL_TABLET | Freq: Once | ORAL | Status: AC
  Administered 2016-09-19: 10 mg via ORAL

## 2016-09-19 NOTE — Progress Notes (Signed)
  Echocardiogram 2D Echocardiogram has been performed.  Kelly Ray 09/19/2016, 9:54 AM

## 2016-09-19 NOTE — Patient Instructions (Signed)
[facility] Discharge Instructions for Patients Receiving Chemotherapy  Today you received the following chemotherapy agents: Etoposide   To help prevent nausea and vomiting after your treatment, we encourage you to take your nausea medication as directed.    If you develop nausea and vomiting that is not controlled by your nausea medication, call the clinic.   BELOW ARE SYMPTOMS THAT SHOULD BE REPORTED IMMEDIATELY:  *FEVER GREATER THAN 100.5 F  *CHILLS WITH OR WITHOUT FEVER  NAUSEA AND VOMITING THAT IS NOT CONTROLLED WITH YOUR NAUSEA MEDICATION  *UNUSUAL SHORTNESS OF BREATH  *UNUSUAL BRUISING OR BLEEDING  TENDERNESS IN MOUTH AND THROAT WITH OR WITHOUT PRESENCE OF ULCERS  *URINARY PROBLEMS  *BOWEL PROBLEMS  UNUSUAL RASH Items with * indicate a potential emergency and should be followed up as soon as possible.  Feel free to call the clinic you have any questions or concerns. The clinic phone number is [phone].  Please show the CHEMO ALERT CARD at check-in to the Emergency Department and triage nurse.   

## 2016-09-20 ENCOUNTER — Ambulatory Visit (HOSPITAL_BASED_OUTPATIENT_CLINIC_OR_DEPARTMENT_OTHER): Payer: Medicare Other

## 2016-09-20 ENCOUNTER — Telehealth: Payer: Self-pay | Admitting: Nurse Practitioner

## 2016-09-20 ENCOUNTER — Ambulatory Visit: Payer: Medicare Other

## 2016-09-20 VITALS — BP 113/66 | HR 68 | Temp 98.7°F | Resp 16

## 2016-09-20 DIAGNOSIS — C8330 Diffuse large B-cell lymphoma, unspecified site: Secondary | ICD-10-CM

## 2016-09-20 DIAGNOSIS — Z5111 Encounter for antineoplastic chemotherapy: Secondary | ICD-10-CM | POA: Diagnosis not present

## 2016-09-20 DIAGNOSIS — C8335 Diffuse large B-cell lymphoma, lymph nodes of inguinal region and lower limb: Secondary | ICD-10-CM | POA: Diagnosis not present

## 2016-09-20 MED ORDER — ETOPOSIDE CHEMO INJECTION 1 GM/50ML
50.0000 mg/m2 | Freq: Once | INTRAVENOUS | Status: AC
  Administered 2016-09-20: 100 mg via INTRAVENOUS
  Filled 2016-09-20: qty 5

## 2016-09-20 MED ORDER — SODIUM CHLORIDE 0.9% FLUSH
10.0000 mL | INTRAVENOUS | Status: AC | PRN
  Administered 2016-09-20: 10 mL
  Filled 2016-09-20: qty 10

## 2016-09-20 MED ORDER — PEGFILGRASTIM 6 MG/0.6ML ~~LOC~~ PSKT
6.0000 mg | PREFILLED_SYRINGE | Freq: Once | SUBCUTANEOUS | Status: AC
  Administered 2016-09-20: 6 mg via SUBCUTANEOUS
  Filled 2016-09-20: qty 0.6

## 2016-09-20 MED ORDER — HEPARIN SOD (PORK) LOCK FLUSH 100 UNIT/ML IV SOLN
500.0000 [IU] | INTRAVENOUS | Status: AC | PRN
  Administered 2016-09-20: 500 [IU]
  Filled 2016-09-20: qty 5

## 2016-09-20 MED ORDER — SODIUM CHLORIDE 0.9 % IV SOLN
Freq: Once | INTRAVENOUS | Status: AC
  Administered 2016-09-20: 12:00:00 via INTRAVENOUS

## 2016-09-20 MED ORDER — PROCHLORPERAZINE MALEATE 10 MG PO TABS
ORAL_TABLET | ORAL | Status: AC
  Filled 2016-09-20: qty 1

## 2016-09-20 MED ORDER — PROCHLORPERAZINE MALEATE 10 MG PO TABS
10.0000 mg | ORAL_TABLET | Freq: Once | ORAL | Status: AC
  Administered 2016-09-20: 10 mg via ORAL

## 2016-09-20 NOTE — Telephone Encounter (Signed)
Patient called nurse to schedule upcoming appointments. Desk nurse requested to have 1/23 appointments scheduled. The patient was in infusion at the time of scheduling. Appointments scheduled per 1/23 LOS. Patient will pick up printouts in infusion room.

## 2016-09-20 NOTE — Patient Instructions (Signed)
[facility] Discharge Instructions for Patients Receiving Chemotherapy  Today you received the following chemotherapy agents: Etoposide   To help prevent nausea and vomiting after your treatment, we encourage you to take your nausea medication as directed.    If you develop nausea and vomiting that is not controlled by your nausea medication, call the clinic.   BELOW ARE SYMPTOMS THAT SHOULD BE REPORTED IMMEDIATELY:  *FEVER GREATER THAN 100.5 F  *CHILLS WITH OR WITHOUT FEVER  NAUSEA AND VOMITING THAT IS NOT CONTROLLED WITH YOUR NAUSEA MEDICATION  *UNUSUAL SHORTNESS OF BREATH  *UNUSUAL BRUISING OR BLEEDING  TENDERNESS IN MOUTH AND THROAT WITH OR WITHOUT PRESENCE OF ULCERS  *URINARY PROBLEMS  *BOWEL PROBLEMS  UNUSUAL RASH Items with * indicate a potential emergency and should be followed up as soon as possible.  Feel free to call the clinic you have any questions or concerns. The clinic phone number is [phone].  Please show the CHEMO ALERT CARD at check-in to the Emergency Department and triage nurse.   

## 2016-09-21 ENCOUNTER — Telehealth: Payer: Self-pay | Admitting: *Deleted

## 2016-09-21 NOTE — Telephone Encounter (Signed)
Per 1/25 LOS and staff message I have scheduled appts and notified the scheduler 

## 2016-09-28 ENCOUNTER — Telehealth: Payer: Self-pay | Admitting: Oncology

## 2016-09-28 NOTE — Telephone Encounter (Signed)
sw pt to confirm r/s appt Lab/office/ chemo to 2/12, 2/13 and 2/14. (pt request)

## 2016-10-01 ENCOUNTER — Other Ambulatory Visit: Payer: Self-pay | Admitting: *Deleted

## 2016-10-01 ENCOUNTER — Telehealth: Payer: Self-pay | Admitting: Pharmacist

## 2016-10-01 DIAGNOSIS — C8335 Diffuse large B-cell lymphoma, lymph nodes of inguinal region and lower limb: Secondary | ICD-10-CM

## 2016-10-01 DIAGNOSIS — C8330 Diffuse large B-cell lymphoma, unspecified site: Secondary | ICD-10-CM

## 2016-10-01 MED ORDER — ETOPOSIDE 50 MG PO CAPS: 100.0000 mg/m2/d | ORAL_CAPSULE | Freq: Every day | ORAL | 0 refills | Status: DC

## 2016-10-01 NOTE — Telephone Encounter (Signed)
Oral Chemotherapy Pharmacist Encounter  Received new prescription for patient for oral etoposide Patient has been receiving R-CHOP and the adriamycin will be substituted by etoposide due to anthracycline cumulative dose issues Patient will receive IV etoposide on Day 1 with the rest of the infusion chemotherapy and then will taker PO etoposide on days 2 and 3 for the next 2 cycles. IV etoposide dose is 50 mg/m2, due to poor bioavailability, oral etoposide doses should be double the IV dose (100 mg/m2)  Labs from 09/18/16 reviewed ok for treatment  Current medication list in Epic assessed, no DDIs with etoposide identified  Prescription will be faxed to [place] for benefits analysis Prior authorization submitted on CoverMyMeds Key [id number] Status is pending, may take 24-72h for determination  Oral Oncology Clinic will continue to follow.  Johny Drilling, PharmD, BCPS, BCOP 10/01/2016  10:25 AM Oral Oncology Clinic [phone]

## 2016-10-03 ENCOUNTER — Telehealth: Payer: Self-pay | Admitting: Pharmacist

## 2016-10-03 NOTE — Telephone Encounter (Signed)
Contacted pt by phone after receiving fax from [facility] that pt was not aware of Rx. No answer at either pts home/moble numbers.  I left her 2 voicemail messages. Pt needs counseling and make sure she p/u RX before starting next cycle on 10/08/16. *Note slightly higher incidence of diarrhea, loss of appetite, CINV & stomatitis (oral > IV) otherwise similar adverse effects could be expected as w/ IV Etoposide. Also, Oral Etoposide should be stored in the fridge*  Kennith Center, Pharm.D., CPP 10/03/2016@3 :01 PM Oral Chemo Clinic

## 2016-10-05 NOTE — Telephone Encounter (Signed)
Oral Chemotherapy Pharmacist Encounter I spoke with patient for overview of new oral chemotherapy medication: Etoposide.  The prescription is ready for p/u.  Pt plans to go p/u on Mon 10/08/16 after chemo appt.  Counseled patient on storage (in fridge), safe handling, and side effects.  I informed pt that there is slightly higher incidence of diarrhea, loss of appetite, CINV & stomatitis (oral > IV) otherwise similar adverse effects could be expected as w/ IV Etoposide.  She has antiemetics at home, she said.  Pt unclear why this is changed from IV.  She is agreeable to taking PO Etoposide. She also wants to talk to Dr. Benay Spice on Monday about the Neulasta injection.    Thank you, Kennith Center, Pharm.D., CPP 10/05/2016@12 :36 PM Oral Chemotherapy Clinic

## 2016-10-07 ENCOUNTER — Other Ambulatory Visit: Payer: Self-pay | Admitting: Oncology

## 2016-10-08 ENCOUNTER — Ambulatory Visit (HOSPITAL_BASED_OUTPATIENT_CLINIC_OR_DEPARTMENT_OTHER): Payer: Medicare Other | Admitting: Oncology

## 2016-10-08 ENCOUNTER — Other Ambulatory Visit: Payer: Self-pay | Admitting: *Deleted

## 2016-10-08 ENCOUNTER — Ambulatory Visit (HOSPITAL_BASED_OUTPATIENT_CLINIC_OR_DEPARTMENT_OTHER): Payer: Medicare Other

## 2016-10-08 ENCOUNTER — Other Ambulatory Visit (HOSPITAL_BASED_OUTPATIENT_CLINIC_OR_DEPARTMENT_OTHER): Payer: Medicare Other

## 2016-10-08 VITALS — BP 122/61 | HR 76 | Temp 98.3°F | Resp 17 | Ht 61.0 in | Wt 182.0 lb

## 2016-10-08 VITALS — BP 107/57 | HR 72 | Temp 98.8°F | Resp 18

## 2016-10-08 DIAGNOSIS — C8335 Diffuse large B-cell lymphoma, lymph nodes of inguinal region and lower limb: Secondary | ICD-10-CM

## 2016-10-08 DIAGNOSIS — Z5112 Encounter for antineoplastic immunotherapy: Secondary | ICD-10-CM

## 2016-10-08 DIAGNOSIS — C8330 Diffuse large B-cell lymphoma, unspecified site: Secondary | ICD-10-CM

## 2016-10-08 DIAGNOSIS — Z5111 Encounter for antineoplastic chemotherapy: Secondary | ICD-10-CM | POA: Diagnosis not present

## 2016-10-08 LAB — CBC WITH DIFFERENTIAL/PLATELET
BASO%: 1.3 % (ref 0.0–2.0)
Basophils Absolute: 0 10*3/uL (ref 0.0–0.1)
EOS%: 0.6 % (ref 0.0–7.0)
Eosinophils Absolute: 0 10*3/uL (ref 0.0–0.5)
HEMATOCRIT: 25.7 % — AB (ref 34.8–46.6)
HGB: 8.7 g/dL — ABNORMAL LOW (ref 11.6–15.9)
LYMPH#: 1 10*3/uL (ref 0.9–3.3)
LYMPH%: 29.4 % (ref 14.0–49.7)
MCH: 33.4 pg (ref 25.1–34.0)
MCHC: 33.8 g/dL (ref 31.5–36.0)
MCV: 98.9 fL (ref 79.5–101.0)
MONO#: 0.4 10*3/uL (ref 0.1–0.9)
MONO%: 13.7 % (ref 0.0–14.0)
NEUT#: 1.8 10*3/uL (ref 1.5–6.5)
NEUT%: 55 % (ref 38.4–76.8)
Platelets: 191 10*3/uL (ref 145–400)
RBC: 2.6 10*6/uL — AB (ref 3.70–5.45)
RDW: 22.3 % — ABNORMAL HIGH (ref 11.2–14.5)
WBC: 3.3 10*3/uL — ABNORMAL LOW (ref 3.9–10.3)

## 2016-10-08 LAB — COMPREHENSIVE METABOLIC PANEL
ALT: 13 U/L (ref 0–55)
AST: 21 U/L (ref 5–34)
Albumin: 3.5 g/dL (ref 3.5–5.0)
Alkaline Phosphatase: 83 U/L (ref 40–150)
Anion Gap: 8 mEq/L (ref 3–11)
BUN: 15.6 mg/dL (ref 7.0–26.0)
CALCIUM: 9.2 mg/dL (ref 8.4–10.4)
CHLORIDE: 105 meq/L (ref 98–109)
CO2: 28 meq/L (ref 22–29)
CREATININE: 0.7 mg/dL (ref 0.6–1.1)
EGFR: 86 mL/min/{1.73_m2} — ABNORMAL LOW (ref >90)
Glucose: 115 mg/dl (ref 70–140)
Potassium: 4.2 mEq/L (ref 3.5–5.1)
Sodium: 141 mEq/L (ref 136–145)
Total Bilirubin: 0.57 mg/dL (ref 0.20–1.20)
Total Protein: 5.9 g/dL — ABNORMAL LOW (ref 6.4–8.3)

## 2016-10-08 MED ORDER — ACETAMINOPHEN 325 MG PO TABS
ORAL_TABLET | ORAL | Status: AC
  Filled 2016-10-08: qty 2

## 2016-10-08 MED ORDER — VINCRISTINE SULFATE CHEMO INJECTION 1 MG/ML
2.0000 mg | Freq: Once | INTRAVENOUS | Status: AC
  Administered 2016-10-08: 2 mg via INTRAVENOUS
  Filled 2016-10-08: qty 2

## 2016-10-08 MED ORDER — SODIUM CHLORIDE 0.9 % IV SOLN
375.0000 mg/m2 | Freq: Once | INTRAVENOUS | Status: AC
  Administered 2016-10-08: 700 mg via INTRAVENOUS
  Filled 2016-10-08: qty 50

## 2016-10-08 MED ORDER — PALONOSETRON HCL INJECTION 0.25 MG/5ML
INTRAVENOUS | Status: AC
  Filled 2016-10-08: qty 5

## 2016-10-08 MED ORDER — PREDNISONE 20 MG PO TABS: ORAL_TABLET | ORAL | 1 refills | Status: DC

## 2016-10-08 MED ORDER — SODIUM CHLORIDE 0.9 % IV SOLN
Freq: Once | INTRAVENOUS | Status: AC
  Administered 2016-10-08: 09:00:00 via INTRAVENOUS

## 2016-10-08 MED ORDER — DEXAMETHASONE SODIUM PHOSPHATE 10 MG/ML IJ SOLN
10.0000 mg | Freq: Once | INTRAMUSCULAR | Status: AC
  Administered 2016-10-08: 10 mg via INTRAVENOUS

## 2016-10-08 MED ORDER — PALONOSETRON HCL INJECTION 0.25 MG/5ML
0.2500 mg | Freq: Once | INTRAVENOUS | Status: AC
  Administered 2016-10-08: 0.25 mg via INTRAVENOUS

## 2016-10-08 MED ORDER — DIPHENHYDRAMINE HCL 25 MG PO CAPS
ORAL_CAPSULE | ORAL | Status: AC
  Filled 2016-10-08: qty 2

## 2016-10-08 MED ORDER — DIPHENHYDRAMINE HCL 25 MG PO CAPS
50.0000 mg | ORAL_CAPSULE | Freq: Once | ORAL | Status: AC
  Administered 2016-10-08: 50 mg via ORAL

## 2016-10-08 MED ORDER — SODIUM CHLORIDE 0.9% FLUSH
10.0000 mL | INTRAVENOUS | Status: DC | PRN
  Administered 2016-10-08: 10 mL
  Filled 2016-10-08: qty 10

## 2016-10-08 MED ORDER — SODIUM CHLORIDE 0.9 % IV SOLN
750.0000 mg/m2 | Freq: Once | INTRAVENOUS | Status: AC
  Administered 2016-10-08: 1460 mg via INTRAVENOUS
  Filled 2016-10-08: qty 73

## 2016-10-08 MED ORDER — DEXAMETHASONE SODIUM PHOSPHATE 10 MG/ML IJ SOLN
INTRAMUSCULAR | Status: AC
  Filled 2016-10-08: qty 1

## 2016-10-08 MED ORDER — HEPARIN SOD (PORK) LOCK FLUSH 100 UNIT/ML IV SOLN
500.0000 [IU] | Freq: Once | INTRAVENOUS | Status: AC | PRN
  Administered 2016-10-08: 500 [IU]
  Filled 2016-10-08: qty 5

## 2016-10-08 MED ORDER — ETOPOSIDE CHEMO INJECTION 1 GM/50ML
50.0000 mg/m2 | Freq: Once | INTRAVENOUS | Status: AC
  Administered 2016-10-08: 100 mg via INTRAVENOUS
  Filled 2016-10-08: qty 5

## 2016-10-08 MED ORDER — ACETAMINOPHEN 325 MG PO TABS
650.0000 mg | ORAL_TABLET | Freq: Once | ORAL | Status: AC
  Administered 2016-10-08: 650 mg via ORAL

## 2016-10-08 NOTE — Progress Notes (Signed)
[place] OFFICE PROGRESS NOTE   Diagnosis: Non-Hodgkin's lymphoma  INTERVAL HISTORY:   Kelly Ray returns as scheduled. She completed another cycle of chemotherapy beginning 09/18/2016. She reports tolerating the chemotherapy well. She developed bone pain for 5 days after the Neulasta injection. The pain was relieved with naproxen. The dominant cutaneous nodule at the right calf continues to resolve.  Objective:  Vital signs in last 24 hours:  Blood pressure 122/61, pulse 76, temperature 98.3 F (36.8 C), temperature source Oral, resp. rate 17, height 5' 1" (1.549 m), weight 182 lb (82.6 kg), SpO2 97 %.    HEENT: No thrush or ulcers Resp: Lungs clear bilaterally Cardio: Regular rate and rhythm GI: No hepatosplenomegaly Vascular: No leg edema  Skin: Minimal, less than 1 cm, area of induration at the mid right calf   Portacath/PICC-without erythema  Lab Results:  Lab Results  Component Value Date   WBC 3.3 (L) 10/08/2016   HGB 8.7 (L) 10/08/2016   HCT 25.7 (L) 10/08/2016   MCV 98.9 10/08/2016   PLT 191 10/08/2016   NEUTROABS 1.8 10/08/2016   Echocardiogram 09/19/2016: LVEF  55-60 %  Medications: I have reviewed the patient's current medications.  Assessment/Plan: 1. 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 Negative restaging CT evaluation 10/24/2012  Nodular skin lesions at the right lower leg, status post a shave biopsy 07/15/2013 confirming a malignant B-cell lymphoma, diffuse large cell positive for CD20, BCL 6, BCL 2, and CD10   Staging bone marrow biopsy 07/31/2013, negative for involvement with lymphoma   Staging PET scan 07/30/2013-negative.   Status post palliative radiation right lower leg nodular skin lesions 08/17/2013 through 09/03/2013.  New nodular skin lesions at the right lower leg April 2017, status postpsies of lesions at  the right lower leg 12/07/2015 confirming large B-cell lymphoma, CD20 positive bio  staging PET scan 12/26/2015-hypermetabolic cutaneous and subcutaneous nodules in the right lower extremity, no other evidence of lymphoma  Cycle 1 bendamustine/Rituxan 01/03/2016  Cycle 2 bendamustine/Rituxan 01/31/2016  Clinical progression with enlargement of multiple right lower leg skin lesions 02/23/2016  Status post radiation right lower leg 03/14/2016 through 03/29/2016  Clinical progression with new and enlarging skin lesions right lower leg 07/06/2016  PET scan 07/12/2016-clear interval increase in size and metabolic activity of a large nodule posterior right calf and a smaller subcutaneous nodule medial right thigh; several more superficial right lower extremity lesions improved with reduction in size and metabolic activity; single focus of metabolic activity associated with the medial right clavicle with bonychange  Cycle 1 CHOP/Rituxan 07/17/2016  Cycle 2 CHOP/Rituxan 08/07/2016-Neulasta added  Cycle 3 CHOP/Rituxan 08/28/2016 with Neulasta support  Restaging PET scan 09/14/2016-hypermetabolic subcutaneous nodules previously demonstrated within the right thigh and right calf have nearly resolved. There is some residual activity within the calf which could be postsurgical. Small focus of activity within the left latissimus dorsi muscle within the posterior chest wall without corresponding CT abnormality.  Cycle 4 CHOP/Rituxan 09/18/2016 (Adriamycin deleted, etoposide substituted daily 3) 2. Port-A-Cath placement 06/01/2011. Port-A-Cath removal 11/04/2012 3. Hypertension. 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 5. Hypercholesterolemia. 6. Osteoarthritis. 7. Malaise-? Secondary to progression of non-Hodgkin's lymphoma. Improved. 8. Staph infection right lower leg February 2015. 9. Right calf lesion with purulent drainage 02/09/2016, culture obtained-group B strep, doxycycline  prescribed 10. Port-A-Cath placement 07/13/2016 11. 2-D echo 07/10/2016-estimated ejection fraction range of 55-60% 12. 07/17/2016 hepatitis Bsurface antigen negative, core antibody positive  13. Neutropenia secondary to chemotherapy 07/27/2016 14. Thrombocytopenia secondary to chemotherapy 07/27/2016 15. Sore throat/dysphagia following cycle 2 CHOP/Rituxan. Question esophagitis.   Disposition:  Kelly Ray has completed 4 cycles of systemic therapy for recurrent non-Hodgkin's lymphoma. The palpable cutaneous nodules at the right lower leg continue to resolve. The plan is to proceed with cycle 5 today. Etoposide was substituted for doxorubicin beginning with cycle 4. She will complete oral etoposide with days 2 and 3 beginning with cycle 5.  Kelly Ray has experienced bone pain with Neulasta. She declined a prescription for pain medication today.  She will return for an office visit and cycle 6 chemotherapy in 3 weeks.  25 minutes were spent with the patient today. The majority of the time was used for counseling and coordination of care.  , , MD  10/08/2016  8:31 AM   

## 2016-10-08 NOTE — Patient Instructions (Signed)
[facility] Discharge Instructions for Patients Receiving Chemotherapy  Today you received the following chemotherapy agents: Vincristine, Cytoxan, Rituxan and Etoposide   To help prevent nausea and vomiting after your treatment, we encourage you to take your nausea medication as directed.    If you develop nausea and vomiting that is not controlled by your nausea medication, call the clinic.   BELOW ARE SYMPTOMS THAT SHOULD BE REPORTED IMMEDIATELY:  *FEVER GREATER THAN 100.5 F  *CHILLS WITH OR WITHOUT FEVER  NAUSEA AND VOMITING THAT IS NOT CONTROLLED WITH YOUR NAUSEA MEDICATION  *UNUSUAL SHORTNESS OF BREATH  *UNUSUAL BRUISING OR BLEEDING  TENDERNESS IN MOUTH AND THROAT WITH OR WITHOUT PRESENCE OF ULCERS  *URINARY PROBLEMS  *BOWEL PROBLEMS  UNUSUAL RASH Items with * indicate a potential emergency and should be followed up as soon as possible.  Feel free to call the clinic you have any questions or concerns. The clinic phone number is [phone].  Please show the [place] at check-in to the Emergency Department and triage nurse.

## 2016-10-09 ENCOUNTER — Other Ambulatory Visit: Payer: Medicare Other

## 2016-10-09 ENCOUNTER — Ambulatory Visit: Payer: Medicare Other

## 2016-10-09 ENCOUNTER — Ambulatory Visit: Payer: Medicare Other | Admitting: Oncology

## 2016-10-09 ENCOUNTER — Telehealth: Payer: Self-pay

## 2016-10-09 ENCOUNTER — Telehealth: Payer: Self-pay | Admitting: *Deleted

## 2016-10-09 NOTE — Telephone Encounter (Signed)
Call placed back to patient to clarify that she is to take four tablets-50 mg each for a total of 200 mg on days two and three of each 21 day cycle of chemotherapy.  Patient appreciative of clarification of medication and call and has no further questions at this time.

## 2016-10-09 NOTE — Telephone Encounter (Signed)
Pt is asking what mg of etoposide to take orally. Dr Benay Spice told her 1 tablet and the pharmacy rx says to take 4 tablets. The mg dose is not mentioned in his OV note.

## 2016-10-10 ENCOUNTER — Ambulatory Visit: Payer: Medicare Other

## 2016-10-11 ENCOUNTER — Ambulatory Visit: Payer: Medicare Other

## 2016-10-11 ENCOUNTER — Telehealth: Payer: Self-pay | Admitting: Oncology

## 2016-10-11 ENCOUNTER — Ambulatory Visit (HOSPITAL_BASED_OUTPATIENT_CLINIC_OR_DEPARTMENT_OTHER): Payer: Medicare Other

## 2016-10-11 VITALS — BP 128/82 | HR 80 | Temp 97.8°F | Resp 20

## 2016-10-11 DIAGNOSIS — C8335 Diffuse large B-cell lymphoma, lymph nodes of inguinal region and lower limb: Secondary | ICD-10-CM | POA: Diagnosis not present

## 2016-10-11 DIAGNOSIS — C8330 Diffuse large B-cell lymphoma, unspecified site: Secondary | ICD-10-CM

## 2016-10-11 MED ORDER — PEGFILGRASTIM INJECTION 6 MG/0.6ML ~~LOC~~
6.0000 mg | PREFILLED_SYRINGE | Freq: Once | SUBCUTANEOUS | Status: AC
  Administered 2016-10-11: 6 mg via SUBCUTANEOUS
  Filled 2016-10-11: qty 0.6

## 2016-10-11 NOTE — Telephone Encounter (Signed)
Patient stopped by after injection for next appointments. Appointments scheduled per 2/12 los. Patient given March schedule.

## 2016-10-11 NOTE — Patient Instructions (Signed)
Pegfilgrastim injection What is this medicine? PEGFILGRASTIM (PEG fil gra stim) is a long-acting granulocyte colony-stimulating factor that stimulates the growth of neutrophils, a type of white blood cell important in the body's fight against infection. It is used to reduce the incidence of fever and infection in patients with certain types of cancer who are receiving chemotherapy that affects the bone marrow, and to increase survival after being exposed to high doses of radiation. This medicine may be used for other purposes; ask your health care provider or pharmacist if you have questions. COMMON BRAND NAME(S): Neulasta What should I tell my health care provider before I take this medicine? They need to know if you have any of these conditions: -kidney disease -latex allergy -ongoing radiation therapy -sickle cell disease -skin reactions to acrylic adhesives (On-Body Injector only) -an unusual or allergic reaction to pegfilgrastim, filgrastim, other medicines, foods, dyes, or preservatives -pregnant or trying to get pregnant -breast-feeding How should I use this medicine? This medicine is for injection under the skin. If you get this medicine at home, you will be taught how to prepare and give the pre-filled syringe or how to use the On-body Injector. Refer to the patient Instructions for Use for detailed instructions. Use exactly as directed. Take your medicine at regular intervals. Do not take your medicine more often than directed. It is important that you put your used needles and syringes in a special sharps container. Do not put them in a trash can. If you do not have a sharps container, call your pharmacist or healthcare provider to get one. Talk to your pediatrician regarding the use of this medicine in children. While this drug may be prescribed for selected conditions, precautions do apply. Overdosage: If you think you have taken too much of this medicine contact a poison control  center or emergency room at once. NOTE: This medicine is only for you. Do not share this medicine with others. What if I miss a dose? It is important not to miss your dose. Call your doctor or health care professional if you miss your dose. If you miss a dose due to an On-body Injector failure or leakage, a new dose should be administered as soon as possible using a single prefilled syringe for manual use. What may interact with this medicine? Interactions have not been studied. Give your health care provider a list of all the medicines, herbs, non-prescription drugs, or dietary supplements you use. Also tell them if you smoke, drink alcohol, or use illegal drugs. Some items may interact with your medicine. This list may not describe all possible interactions. Give your health care provider a list of all the medicines, herbs, non-prescription drugs, or dietary supplements you use. Also tell them if you smoke, drink alcohol, or use illegal drugs. Some items may interact with your medicine. What should I watch for while using this medicine? You may need blood work done while you are taking this medicine. If you are going to need a MRI, CT scan, or other procedure, tell your doctor that you are using this medicine (On-Body Injector only). What side effects may I notice from receiving this medicine? Side effects that you should report to your doctor or health care professional as soon as possible: -allergic reactions like skin rash, itching or hives, swelling of the face, lips, or tongue -dizziness -fever -pain, redness, or irritation at site where injected -pinpoint red spots on the skin -red or dark-brown urine -shortness of breath or breathing problems -stomach or   side pain, or pain at the shoulder -swelling -tiredness -trouble passing urine or change in the amount of urine Side effects that usually do not require medical attention (report to your doctor or health care professional if they  continue or are bothersome): -bone pain -muscle pain This list may not describe all possible side effects. Call your doctor for medical advice about side effects. You may report side effects to FDA at 1-800-FDA-1088. Where should I keep my medicine? Keep out of the reach of children. Store pre-filled syringes in a refrigerator between 2 and 8 degrees C (36 and 46 degrees F). Do not freeze. Keep in carton to protect from light. Throw away this medicine if it is left out of the refrigerator for more than 48 hours. Throw away any unused medicine after the expiration date. NOTE: This sheet is a summary. It may not cover all possible information. If you have questions about this medicine, talk to your doctor, pharmacist, or health care provider.  2017 Elsevier/Gold Standard (2014-09-02 14:30:14)  

## 2016-10-22 ENCOUNTER — Ambulatory Visit: Payer: Medicare Other

## 2016-10-28 ENCOUNTER — Other Ambulatory Visit: Payer: Self-pay | Admitting: Oncology

## 2016-10-29 ENCOUNTER — Ambulatory Visit (HOSPITAL_BASED_OUTPATIENT_CLINIC_OR_DEPARTMENT_OTHER): Payer: Medicare Other

## 2016-10-29 ENCOUNTER — Ambulatory Visit (HOSPITAL_BASED_OUTPATIENT_CLINIC_OR_DEPARTMENT_OTHER): Payer: Medicare Other | Admitting: Oncology

## 2016-10-29 ENCOUNTER — Other Ambulatory Visit (HOSPITAL_BASED_OUTPATIENT_CLINIC_OR_DEPARTMENT_OTHER): Payer: Medicare Other

## 2016-10-29 ENCOUNTER — Ambulatory Visit: Payer: Medicare Other

## 2016-10-29 ENCOUNTER — Other Ambulatory Visit: Payer: Self-pay | Admitting: *Deleted

## 2016-10-29 VITALS — BP 132/52 | HR 71 | Temp 97.8°F | Resp 18 | Ht 61.0 in | Wt 182.0 lb

## 2016-10-29 VITALS — BP 125/73 | HR 67 | Temp 98.5°F | Resp 18

## 2016-10-29 DIAGNOSIS — C8335 Diffuse large B-cell lymphoma, lymph nodes of inguinal region and lower limb: Secondary | ICD-10-CM

## 2016-10-29 DIAGNOSIS — Z5111 Encounter for antineoplastic chemotherapy: Secondary | ICD-10-CM | POA: Diagnosis not present

## 2016-10-29 DIAGNOSIS — D649 Anemia, unspecified: Secondary | ICD-10-CM | POA: Diagnosis not present

## 2016-10-29 DIAGNOSIS — Z5112 Encounter for antineoplastic immunotherapy: Secondary | ICD-10-CM

## 2016-10-29 DIAGNOSIS — R0600 Dyspnea, unspecified: Secondary | ICD-10-CM | POA: Diagnosis not present

## 2016-10-29 DIAGNOSIS — C8330 Diffuse large B-cell lymphoma, unspecified site: Secondary | ICD-10-CM

## 2016-10-29 LAB — COMPREHENSIVE METABOLIC PANEL
ALT: 10 U/L (ref 0–55)
AST: 18 U/L (ref 5–34)
Albumin: 3.5 g/dL (ref 3.5–5.0)
Alkaline Phosphatase: 78 U/L (ref 40–150)
Anion Gap: 8 mEq/L (ref 3–11)
BUN: 11.1 mg/dL (ref 7.0–26.0)
CHLORIDE: 104 meq/L (ref 98–109)
CO2: 27 mEq/L (ref 22–29)
Calcium: 8.7 mg/dL (ref 8.4–10.4)
Creatinine: 0.6 mg/dL (ref 0.6–1.1)
EGFR: 88 mL/min/{1.73_m2} — AB (ref >90)
Glucose: 102 mg/dl (ref 70–140)
Potassium: 3.9 mEq/L (ref 3.5–5.1)
Sodium: 139 mEq/L (ref 136–145)
Total Bilirubin: 0.57 mg/dL (ref 0.20–1.20)
Total Protein: 5.8 g/dL — ABNORMAL LOW (ref 6.4–8.3)

## 2016-10-29 LAB — CBC WITH DIFFERENTIAL/PLATELET
BASO%: 0.6 % (ref 0.0–2.0)
Basophils Absolute: 0 10*3/uL (ref 0.0–0.1)
EOS ABS: 0 10*3/uL (ref 0.0–0.5)
EOS%: 0.8 % (ref 0.0–7.0)
HCT: 24.1 % — ABNORMAL LOW (ref 34.8–46.6)
HGB: 7.8 g/dL — ABNORMAL LOW (ref 11.6–15.9)
LYMPH%: 26.4 % (ref 14.0–49.7)
MCH: 32.9 pg (ref 25.1–34.0)
MCHC: 32.4 g/dL (ref 31.5–36.0)
MCV: 101.7 fL — AB (ref 79.5–101.0)
MONO#: 0.4 10*3/uL (ref 0.1–0.9)
MONO%: 12.1 % (ref 0.0–14.0)
NEUT%: 60.1 % (ref 38.4–76.8)
NEUTROS ABS: 2.1 10*3/uL (ref 1.5–6.5)
PLATELETS: 177 10*3/uL (ref 145–400)
RBC: 2.37 10*6/uL — AB (ref 3.70–5.45)
RDW: 17.7 % — ABNORMAL HIGH (ref 11.2–14.5)
WBC: 3.6 10*3/uL — AB (ref 3.9–10.3)
lymph#: 0.9 10*3/uL (ref 0.9–3.3)
nRBC: 0 % (ref 0–0)

## 2016-10-29 MED ORDER — DEXAMETHASONE SODIUM PHOSPHATE 10 MG/ML IJ SOLN
INTRAMUSCULAR | Status: AC
  Filled 2016-10-29: qty 1

## 2016-10-29 MED ORDER — DIPHENHYDRAMINE HCL 25 MG PO CAPS
ORAL_CAPSULE | ORAL | Status: AC
  Filled 2016-10-29: qty 2

## 2016-10-29 MED ORDER — DIPHENHYDRAMINE HCL 25 MG PO CAPS
50.0000 mg | ORAL_CAPSULE | Freq: Once | ORAL | Status: AC
  Administered 2016-10-29: 50 mg via ORAL

## 2016-10-29 MED ORDER — PALONOSETRON HCL INJECTION 0.25 MG/5ML
0.2500 mg | Freq: Once | INTRAVENOUS | Status: AC
  Administered 2016-10-29: 0.25 mg via INTRAVENOUS

## 2016-10-29 MED ORDER — VINCRISTINE SULFATE CHEMO INJECTION 1 MG/ML
2.0000 mg | Freq: Once | INTRAVENOUS | Status: AC
  Administered 2016-10-29: 2 mg via INTRAVENOUS
  Filled 2016-10-29: qty 2

## 2016-10-29 MED ORDER — DEXAMETHASONE SODIUM PHOSPHATE 10 MG/ML IJ SOLN
10.0000 mg | Freq: Once | INTRAMUSCULAR | Status: AC
  Administered 2016-10-29: 10 mg via INTRAVENOUS

## 2016-10-29 MED ORDER — HEPARIN SOD (PORK) LOCK FLUSH 100 UNIT/ML IV SOLN
500.0000 [IU] | Freq: Once | INTRAVENOUS | Status: AC | PRN
  Administered 2016-10-29: 500 [IU]
  Filled 2016-10-29: qty 5

## 2016-10-29 MED ORDER — PREDNISONE 20 MG PO TABS: ORAL_TABLET | ORAL | 1 refills | Status: DC

## 2016-10-29 MED ORDER — CYCLOPHOSPHAMIDE CHEMO INJECTION 1 GM
750.0000 mg/m2 | Freq: Once | INTRAMUSCULAR | Status: AC
  Administered 2016-10-29: 1460 mg via INTRAVENOUS
  Filled 2016-10-29: qty 73

## 2016-10-29 MED ORDER — SODIUM CHLORIDE 0.9 % IV SOLN
50.0000 mg/m2 | Freq: Once | INTRAVENOUS | Status: AC
  Administered 2016-10-29: 100 mg via INTRAVENOUS
  Filled 2016-10-29: qty 5

## 2016-10-29 MED ORDER — SODIUM CHLORIDE 0.9 % IV SOLN
Freq: Once | INTRAVENOUS | Status: AC
  Administered 2016-10-29: 11:00:00 via INTRAVENOUS

## 2016-10-29 MED ORDER — ACETAMINOPHEN 325 MG PO TABS
650.0000 mg | ORAL_TABLET | Freq: Once | ORAL | Status: AC
  Administered 2016-10-29: 650 mg via ORAL

## 2016-10-29 MED ORDER — ACETAMINOPHEN 325 MG PO TABS
ORAL_TABLET | ORAL | Status: AC
  Filled 2016-10-29: qty 2

## 2016-10-29 MED ORDER — SODIUM CHLORIDE 0.9% FLUSH
10.0000 mL | INTRAVENOUS | Status: DC | PRN
  Administered 2016-10-29: 10 mL
  Filled 2016-10-29: qty 10

## 2016-10-29 MED ORDER — PALONOSETRON HCL INJECTION 0.25 MG/5ML
INTRAVENOUS | Status: AC
  Filled 2016-10-29: qty 5

## 2016-10-29 MED ORDER — SODIUM CHLORIDE 0.9 % IV SOLN
375.0000 mg/m2 | Freq: Once | INTRAVENOUS | Status: AC
  Administered 2016-10-29: 700 mg via INTRAVENOUS
  Filled 2016-10-29: qty 50

## 2016-10-29 NOTE — Patient Instructions (Signed)
[facility] Discharge Instructions for Patients Receiving Chemotherapy  Today you received the following chemotherapy agents: Vincristine, cytoxan, etoposide, and rituxan.  To help prevent nausea and vomiting after your treatment, we encourage you to take your nausea medication as directed.   If you develop nausea and vomiting that is not controlled by your nausea medication, call the clinic.   BELOW ARE SYMPTOMS THAT SHOULD BE REPORTED IMMEDIATELY:  *FEVER GREATER THAN 100.5 F  *CHILLS WITH OR WITHOUT FEVER  NAUSEA AND VOMITING THAT IS NOT CONTROLLED WITH YOUR NAUSEA MEDICATION  *UNUSUAL SHORTNESS OF BREATH  *UNUSUAL BRUISING OR BLEEDING  TENDERNESS IN MOUTH AND THROAT WITH OR WITHOUT PRESENCE OF ULCERS  *URINARY PROBLEMS  *BOWEL PROBLEMS  UNUSUAL RASH Items with * indicate a potential emergency and should be followed up as soon as possible.  Feel free to call the clinic you have any questions or concerns. The clinic phone number is [phone].  Please show the [place] at check-in to the Emergency Department and triage nurse.

## 2016-10-29 NOTE — Progress Notes (Signed)
OK to treat today with HGB-7.8 per order of Dr. Benay Spice.

## 2016-10-29 NOTE — Progress Notes (Signed)
[facility] OFFICE PROGRESS NOTE   Diagnosis: Non-Hodgkin's lymphoma  INTERVAL HISTORY:   Kelly Ray returns as scheduled. She completed another cycle of chemotherapy beginning 10/08/2016. She had bone pain lasting for 1 week after the Neulasta injection. She did not take pain medication. There are no palpable skin nodules. She reports exertional dyspnea since beginning chemotherapy. This has not changed since the last treatment with chemotherapy. No nausea with the oral etoposide.  Objective:  Vital signs in last 24 hours:  Blood pressure (!) 132/52, pulse 71, temperature 97.8 F (36.6 C), temperature source Oral, resp. rate 18, height _0  (1.549 m), weight 182 lb (82.6 kg), SpO2 100 %.    HEENT: No thrush or ulcers Resp: Lungs clear bilaterally Cardio: Regular rate and rhythm GI: No hepatosplenomegaly Vascular: Trace edema at the lower leg and ankle bilaterally  Skin: Round hyperpigmented area at the right pretibial region without significant nodularity, area of slight induration measuring less than half centimeter at the right mid calf.   Portacath/PICC-without erythema  Lab Results:  Lab Results  Component Value Date   WBC 3.6 (L) 10/29/2016   HGB 7.8 (L) 10/29/2016   HCT 24.1 (L) 10/29/2016   MCV 101.7 (H) 10/29/2016   PLT 177 10/29/2016   NEUTROABS 2.1 10/29/2016    Medications: I have reviewed the patient's current medications.  1. 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 Negative restaging CT evaluation 10/24/2012  Nodular skin lesions at the right lower leg, status post a shave biopsy 07/15/2013 confirming a malignant B-cell lymphoma, diffuse large cell positive for CD20, BCL 6, BCL 2, and CD10   Staging bone marrow biopsy 07/31/2013, negative for involvement with lymphoma   Staging PET scan 07/30/2013-negative.   Status post  palliative radiation right lower leg nodular skin lesions 08/17/2013 through 09/03/2013.  New nodular skin lesions at the right lower leg April 2017, status postpsies of lesions at the right lower leg 12/07/2015 confirming large B-cell lymphoma, CD20 positive bio  staging PET scan 12/26/2015-hypermetabolic cutaneous and subcutaneous nodules in the right lower extremity, no other evidence of lymphoma  Cycle 1 bendamustine/Rituxan 01/03/2016  Cycle 2 bendamustine/Rituxan 01/31/2016  Clinical progression with enlargement of multiple right lower leg skin lesions 02/23/2016  Status post radiation right lower leg 03/14/2016 through 03/29/2016  Clinical progression with new and enlarging skin lesions right lower leg 07/06/2016  PET scan 07/12/2016-clear interval increase in size and metabolic activity of a large nodule posterior right calf and a smaller subcutaneous nodule medial right thigh; several more superficial right lower extremity lesions improved with reduction in size and metabolic activity; single focus of metabolic activity associated with the medial right clavicle with bonychange  Cycle 1 CHOP/Rituxan 07/17/2016  Cycle 2 CHOP/Rituxan 08/07/2016-Neulasta added  Cycle 3 CHOP/Rituxan 08/28/2016 with Neulasta support  Restaging PET scan 09/14/2016-hypermetabolic subcutaneous nodules previously demonstrated within the right thigh and right calf have nearly resolved. There is some residual activity within the calf which could be postsurgical. Small focus of activity within the left latissimus dorsi muscle within the posterior chest wall without corresponding CT abnormality.  Cycle 4 CHOP/Rituxan 09/18/2016 (Adriamycin deleted, etoposide substituted daily 3)  Cycle 5 CHOP/rituximab with etoposide 10/08/2016  Cycle 6 CHOP/rituximab with etoposide 10/29/2016 2. Port-A-Cath placement 06/01/2011. Port-A-Cath removal 11/04/2012 3. Hypertension. 4. Hypothyroid on  replacement. 5. Hypercholesterolemia. 6. Osteoarthritis. 7. Malaise-? Secondary to progression of non-Hodgkin's lymphoma. Improved. 8. Staph infection right  lower leg February 2015. 9. Right calf lesion with purulent drainage 02/09/2016, culture obtained-group B strep, doxycycline prescribed 10. Port-A-Cath placement 07/13/2016 11. 2-D echo 07/10/2016-estimated ejection fraction range of 55-60% 12. 07/17/2016 hepatitis Bsurface antigen negative, core antibody positive 13. Neutropenia secondary to chemotherapy 07/27/2016 14. Thrombocytopenia secondary to chemotherapy 07/27/2016 15. Anemia secondary to chemotherapy   Disposition:  Kelly Ray appears stable. The plan is to complete cycle 6 chemotherapy beginning today. She will again take oral etoposide with days 2 and 3 chemotherapy. She will contact us for increased bone pain. We considered stopping Neulasta with this cycle, but the white count has remained borderline low despite receiving Neulasta.  She has anemia, likely secondary to chemotherapy. She has mild exertional dyspnea but otherwise appears asymptomatic from the anemia. She will return for a CBC on 11/08/2016. We will arrange for transfusion support as indicated.  Kelly Ray  will return for an office visit, lab, and Port-A-Cath flush in 6 weeks.  25 minutes were spent with the patient today. The majority of the time was used for counseling and coordination of care.  Betsy Coder, MD  10/29/2016  10:48 AM

## 2016-10-29 NOTE — Patient Instructions (Signed)
Implanted Port Home Guide An implanted port is a type of central line that is placed under the skin. Central lines are used to provide IV access when treatment or nutrition needs to be given through a person's veins. Implanted ports are used for long-term IV access. An implanted port may be placed because:  You need IV medicine that would be irritating to the small veins in your hands or arms.  You need long-term IV medicines, such as antibiotics.  You need IV nutrition for a long period.  You need frequent blood draws for lab tests.  You need dialysis.  Implanted ports are usually placed in the chest area, but they can also be placed in the upper arm, the abdomen, or the leg. An implanted port has two main parts:  Reservoir. The reservoir is round and will appear as a small, raised area under your skin. The reservoir is the part where a needle is inserted to give medicines or draw blood.  Catheter. The catheter is a thin, flexible tube that extends from the reservoir. The catheter is placed into a large vein. Medicine that is inserted into the reservoir goes into the catheter and then into the vein.  How will I care for my incision site? Do not get the incision site wet. Bathe or shower as directed by your health care provider. How is my port accessed? Special steps must be taken to access the port:  Before the port is accessed, a numbing cream can be placed on the skin. This helps numb the skin over the port site.  Your health care provider uses a sterile technique to access the port. ? Your health care provider must put on a mask and sterile gloves. ? The skin over your port is cleaned carefully with an antiseptic and allowed to dry. ? The port is gently pinched between sterile gloves, and a needle is inserted into the port.  Only "non-coring" port needles should be used to access the port. Once the port is accessed, a blood return should be checked. This helps ensure that the port  is in the vein and is not clogged.  If your port needs to remain accessed for a constant infusion, a clear (transparent) bandage will be placed over the needle site. The bandage and needle will need to be changed every week, or as directed by your health care provider.  Keep the bandage covering the needle clean and dry. Do not get it wet. Follow your health care provider's instructions on how to take a shower or bath while the port is accessed.  If your port does not need to stay accessed, no bandage is needed over the port.  What is flushing? Flushing helps keep the port from getting clogged. Follow your health care provider's instructions on how and when to flush the port. Ports are usually flushed with saline solution or a medicine called heparin. The need for flushing will depend on how the port is used.  If the port is used for intermittent medicines or blood draws, the port will need to be flushed: ? After medicines have been given. ? After blood has been drawn. ? As part of routine maintenance.  If a constant infusion is running, the port may not need to be flushed.  How long will my port stay implanted? The port can stay in for as long as your health care provider thinks it is needed. When it is time for the port to come out, surgery will be   done to remove it. The procedure is similar to the one performed when the port was put in. When should I seek immediate medical care? When you have an implanted port, you should seek immediate medical care if:  You notice a bad smell coming from the incision site.  You have swelling, redness, or drainage at the incision site.  You have more swelling or pain at the port site or the surrounding area.  You have a fever that is not controlled with medicine.  This information is not intended to replace advice given to you by your health care provider. Make sure you discuss any questions you have with your health care provider. Document  Released: 08/13/2005 Document Revised: 01/19/2016 Document Reviewed: 04/20/2013 Elsevier Interactive Patient Education  2017 [employer]  

## 2016-10-30 ENCOUNTER — Telehealth: Payer: Self-pay | Admitting: Oncology

## 2016-10-30 NOTE — Telephone Encounter (Signed)
Appointments scheduled per 3/5 LOS. Patient notified.

## 2016-11-01 ENCOUNTER — Ambulatory Visit (HOSPITAL_BASED_OUTPATIENT_CLINIC_OR_DEPARTMENT_OTHER): Payer: Medicare Other

## 2016-11-01 VITALS — BP 161/80 | HR 97 | Temp 98.4°F | Resp 18

## 2016-11-01 DIAGNOSIS — Z5189 Encounter for other specified aftercare: Secondary | ICD-10-CM

## 2016-11-01 DIAGNOSIS — C8335 Diffuse large B-cell lymphoma, lymph nodes of inguinal region and lower limb: Secondary | ICD-10-CM | POA: Diagnosis not present

## 2016-11-01 DIAGNOSIS — C8330 Diffuse large B-cell lymphoma, unspecified site: Secondary | ICD-10-CM

## 2016-11-01 MED ORDER — PEGFILGRASTIM INJECTION 6 MG/0.6ML ~~LOC~~
6.0000 mg | PREFILLED_SYRINGE | Freq: Once | SUBCUTANEOUS | Status: AC
  Administered 2016-11-01: 6 mg via SUBCUTANEOUS
  Filled 2016-11-01: qty 0.6

## 2016-11-01 NOTE — Patient Instructions (Signed)
Pegfilgrastim injection (Neulasta) What is this medicine? PEGFILGRASTIM (PEG fil gra stim) is a long-acting granulocyte colony-stimulating factor that stimulates the growth of neutrophils, a type of white blood cell important in the body's fight against infection. It is used to reduce the incidence of fever and infection in patients with certain types of cancer who are receiving chemotherapy that affects the bone marrow, and to increase survival after being exposed to high doses of radiation. This medicine may be used for other purposes; ask your health care provider or pharmacist if you have questions. COMMON BRAND NAME(S): Neulasta What should I tell my health care provider before I take this medicine? They need to know if you have any of these conditions: -kidney disease -latex allergy -ongoing radiation therapy -sickle cell disease -skin reactions to acrylic adhesives (On-Body Injector only) -an unusual or allergic reaction to pegfilgrastim, filgrastim, other medicines, foods, dyes, or preservatives -pregnant or trying to get pregnant -breast-feeding How should I use this medicine? This medicine is for injection under the skin. If you get this medicine at home, you will be taught how to prepare and give the pre-filled syringe or how to use the On-body Injector. Refer to the patient Instructions for Use for detailed instructions. Use exactly as directed. Tell your healthcare provider immediately if you suspect that the On-body Injector may not have performed as intended or if you suspect the use of the On-body Injector resulted in a missed or partial dose. It is important that you put your used needles and syringes in a special sharps container. Do not put them in a trash can. If you do not have a sharps container, call your pharmacist or healthcare provider to get one. Talk to your pediatrician regarding the use of this medicine in children. While this drug may be prescribed for selected  conditions, precautions do apply. Overdosage: If you think you have taken too much of this medicine contact a poison control center or emergency room at once. NOTE: This medicine is only for you. Do not share this medicine with others. What if I miss a dose? It is important not to miss your dose. Call your doctor or health care professional if you miss your dose. If you miss a dose due to an On-body Injector failure or leakage, a new dose should be administered as soon as possible using a single prefilled syringe for manual use. What may interact with this medicine? Interactions have not been studied. Give your health care provider a list of all the medicines, herbs, non-prescription drugs, or dietary supplements you use. Also tell them if you smoke, drink alcohol, or use illegal drugs. Some items may interact with your medicine. This list may not describe all possible interactions. Give your health care provider a list of all the medicines, herbs, non-prescription drugs, or dietary supplements you use. Also tell them if you smoke, drink alcohol, or use illegal drugs. Some items may interact with your medicine. What should I watch for while using this medicine? You may need blood work done while you are taking this medicine. If you are going to need a MRI, CT scan, or other procedure, tell your doctor that you are using this medicine (On-Body Injector only). What side effects may I notice from receiving this medicine? Side effects that you should report to your doctor or health care professional as soon as possible: -allergic reactions like skin rash, itching or hives, swelling of the face, lips, or tongue -dizziness -fever -pain, redness, or irritation at   site where injected -pinpoint red spots on the skin -red or dark-brown urine -shortness of breath or breathing problems -stomach or side pain, or pain at the shoulder -swelling -tiredness -trouble passing urine or change in the amount of  urine Side effects that usually do not require medical attention (report to your doctor or health care professional if they continue or are bothersome): -bone pain -muscle pain This list may not describe all possible side effects. Call your doctor for medical advice about side effects. You may report side effects to FDA at 1-800-FDA-1088. Where should I keep my medicine? Keep out of the reach of children. Store pre-filled syringes in a refrigerator between 2 and 8 degrees C (36 and 46 degrees F). Do not freeze. Keep in carton to protect from light. Throw away this medicine if it is left out of the refrigerator for more than 48 hours. Throw away any unused medicine after the expiration date. NOTE: This sheet is a summary. It may not cover all possible information. If you have questions about this medicine, talk to your doctor, pharmacist, or health care provider.  2018 Elsevier/Gold Standard (2016-08-09 12:58:03)  

## 2016-11-07 ENCOUNTER — Ambulatory Visit (HOSPITAL_BASED_OUTPATIENT_CLINIC_OR_DEPARTMENT_OTHER): Payer: Medicare Other

## 2016-11-07 ENCOUNTER — Telehealth: Payer: Self-pay | Admitting: *Deleted

## 2016-11-07 ENCOUNTER — Ambulatory Visit (HOSPITAL_BASED_OUTPATIENT_CLINIC_OR_DEPARTMENT_OTHER): Payer: Medicare Other | Admitting: Nurse Practitioner

## 2016-11-07 ENCOUNTER — Ambulatory Visit (HOSPITAL_COMMUNITY)
Admission: RE | Admit: 2016-11-07 | Discharge: 2016-11-07 | Disposition: A | Discharge status: Home / Self Care | Payer: Medicare Other | Source: Ambulatory Visit | Attending: Oncology | Admitting: Oncology

## 2016-11-07 ENCOUNTER — Other Ambulatory Visit: Payer: Self-pay | Admitting: Nurse Practitioner

## 2016-11-07 ENCOUNTER — Encounter: Payer: Self-pay | Admitting: Nurse Practitioner

## 2016-11-07 VITALS — BP 96/48 | HR 78 | Temp 97.8°F | Resp 16

## 2016-11-07 VITALS — BP 95/56 | HR 93 | Temp 97.7°F | Resp 17 | Wt 176.0 lb

## 2016-11-07 DIAGNOSIS — T451X5A Adverse effect of antineoplastic and immunosuppressive drugs, initial encounter: Secondary | ICD-10-CM

## 2016-11-07 DIAGNOSIS — C8335 Diffuse large B-cell lymphoma, lymph nodes of inguinal region and lower limb: Secondary | ICD-10-CM

## 2016-11-07 DIAGNOSIS — E860 Dehydration: Secondary | ICD-10-CM

## 2016-11-07 DIAGNOSIS — D61810 Antineoplastic chemotherapy induced pancytopenia: Secondary | ICD-10-CM

## 2016-11-07 DIAGNOSIS — D6481 Anemia due to antineoplastic chemotherapy: Secondary | ICD-10-CM

## 2016-11-07 DIAGNOSIS — Z95828 Presence of other vascular implants and grafts: Secondary | ICD-10-CM

## 2016-11-07 LAB — COMPREHENSIVE METABOLIC PANEL
ALT: 11 U/L (ref 0–55)
ANION GAP: 9 meq/L (ref 3–11)
AST: 11 U/L (ref 5–34)
Albumin: 3.2 g/dL — ABNORMAL LOW (ref 3.5–5.0)
Alkaline Phosphatase: 68 U/L (ref 40–150)
BUN: 20.4 mg/dL (ref 7.0–26.0)
CALCIUM: 9.3 mg/dL (ref 8.4–10.4)
CHLORIDE: 97 meq/L — AB (ref 98–109)
CO2: 28 mEq/L (ref 22–29)
CREATININE: 0.8 mg/dL (ref 0.6–1.1)
EGFR: 74 mL/min/{1.73_m2} — ABNORMAL LOW (ref >90)
Glucose: 131 mg/dl (ref 70–140)
Potassium: 3.6 mEq/L (ref 3.5–5.1)
Sodium: 135 mEq/L — ABNORMAL LOW (ref 136–145)
Total Bilirubin: 1.62 mg/dL — ABNORMAL HIGH (ref 0.20–1.20)
Total Protein: 5.7 g/dL — ABNORMAL LOW (ref 6.4–8.3)

## 2016-11-07 LAB — PREPARE RBC (CROSSMATCH)

## 2016-11-07 LAB — CBC WITH DIFFERENTIAL/PLATELET
BASO%: 0 % (ref 0.0–2.0)
Basophils Absolute: 0 10*3/uL (ref 0.0–0.1)
EOS ABS: 0 10*3/uL (ref 0.0–0.5)
EOS%: 2.2 % (ref 0.0–7.0)
HEMATOCRIT: 21.8 % — AB (ref 34.8–46.6)
HEMOGLOBIN: 7.4 g/dL — AB (ref 11.6–15.9)
LYMPH%: 28.3 % (ref 14.0–49.7)
MCH: 32.7 pg (ref 25.1–34.0)
MCHC: 33.9 g/dL (ref 31.5–36.0)
MCV: 96.5 fL (ref 79.5–101.0)
MONO#: 0.5 10*3/uL (ref 0.1–0.9)
MONO%: 57.6 % — ABNORMAL HIGH (ref 0.0–14.0)
NEUT#: 0.1 10*3/uL — CL (ref 1.5–6.5)
NEUT%: 11.9 % — AB (ref 38.4–76.8)
PLATELETS: 46 10*3/uL — AB (ref 145–400)
RBC: 2.26 10*6/uL — ABNORMAL LOW (ref 3.70–5.45)
RDW: 15 % — ABNORMAL HIGH (ref 11.2–14.5)
WBC: 0.9 10*3/uL — CL (ref 3.9–10.3)
lymph#: 0.3 10*3/uL — ABNORMAL LOW (ref 0.9–3.3)
nRBC: 0 % (ref 0–0)

## 2016-11-07 MED ORDER — ACETAMINOPHEN 325 MG PO TABS
650.0000 mg | ORAL_TABLET | Freq: Once | ORAL | Status: AC
  Administered 2016-11-07: 650 mg via ORAL

## 2016-11-07 MED ORDER — ACETAMINOPHEN 325 MG PO TABS
ORAL_TABLET | ORAL | Status: AC
  Filled 2016-11-07: qty 2

## 2016-11-07 MED ORDER — SODIUM CHLORIDE 0.9% FLUSH
10.0000 mL | INTRAVENOUS | Status: DC | PRN
  Administered 2016-11-07: 10 mL via INTRAVENOUS
  Filled 2016-11-07: qty 10

## 2016-11-07 MED ORDER — HEPARIN SOD (PORK) LOCK FLUSH 100 UNIT/ML IV SOLN
500.0000 [IU] | Freq: Once | INTRAVENOUS | Status: AC | PRN
  Administered 2016-11-07: 500 [IU] via INTRAVENOUS
  Filled 2016-11-07: qty 5

## 2016-11-07 MED ORDER — SODIUM CHLORIDE 0.9 % IV SOLN
250.0000 mL | Freq: Once | INTRAVENOUS | Status: AC
  Administered 2016-11-07: 250 mL via INTRAVENOUS

## 2016-11-07 MED ORDER — DIPHENHYDRAMINE HCL 25 MG PO CAPS
ORAL_CAPSULE | ORAL | Status: AC
  Filled 2016-11-07: qty 1

## 2016-11-07 MED ORDER — DIPHENHYDRAMINE HCL 25 MG PO CAPS
25.0000 mg | ORAL_CAPSULE | Freq: Once | ORAL | Status: AC
  Administered 2016-11-07: 25 mg via ORAL

## 2016-11-07 MED ORDER — CIPROFLOXACIN HCL 500 MG PO TABS: 500.0000 mg | ORAL_TABLET | Freq: Two times a day (BID) | ORAL | 0 refills | Status: DC

## 2016-11-07 MED ORDER — SODIUM CHLORIDE 0.9 % IV SOLN
INTRAVENOUS | Status: AC
  Administered 2016-11-07: 14:00:00 via INTRAVENOUS

## 2016-11-07 NOTE — Assessment & Plan Note (Signed)
Patient completed her final cycle 6.  Large CHOP chemotherapy on 10/29/2016.  She also received Neulasta for growth factor support following this last cycle of chemotherapy.  Patient presented to the [facility] today with complaint of significant fatigue and weakness.  She was also slightly short of breath with any exertion.  She had been experiencing some chills but no fever.  See further notes for details of today's visit.  Patient will return to the [facility] on Friday, 11/09/2016 for labs, a distant with symptom management clinic and a blood transfusion times one unit.  Also, patient has been scheduled for labs, flush, and a visit with Dr. Benay Spice on 12/10/2016.

## 2016-11-07 NOTE — Progress Notes (Signed)
Transferred over to infusion area for blood transfusion and NS infusion. Pt to get 1 unit today and 1 unit tomorrow, along with 500 cc NS. Report given to Berneta Sages., RN Transported via w/c with pt's husband in attendance.

## 2016-11-07 NOTE — Assessment & Plan Note (Signed)
Patient presented to the [facility] today with increased fatigue and weakness.  She's had decreased oral intake and feels dehydrated as well.  Blood pressure was down to 95/56 today.  Patient has a history of hypertension; and confirm that she did take her losartan/HCTZ earlier this morning.  As directed.  Patient's sodium was down to 135 today.  She will receive 500 ML's Normal Saline IV Fluid Rehydration While the [facility] Today; in Addition to One Unit of Blood Transfusion.

## 2016-11-07 NOTE — Assessment & Plan Note (Signed)
Patient received her last cycle of R CHOP chemotherapy on 10/29/2016.  She also received Neulasta for growth factor support at that time.  Blood counts today revealed WBC 0.9, ANC 0.1, hemoglobin 7.4, and platelet count 46.  Patient feels increasingly fatigued and weak.  He reaches also slightly short of breath with any type of exertion.  She has had some chills but no fever.  She denies any URI or UTI symptoms.  She denies any cough.  Of note-patient's bilirubin is elevated; but this could be secondary to patient's dehydration.  Will continue to monitor bilirubin closely.  Dr. Benay Spice in 2 visit.  Patient as well today.  He has recommended that patient received Cipro 500 mg twice daily prophylactically due to the neutropenia.  Reviewed all neutropenia precautions with patient and her husband today.  Patient appears weak and fatigued; she does not appear toxic today.  Temperature was 97.7.  Hopefully, the Neulasta growth factor support will increase the white count within the next few days.  Patient received 1 unit packed red blood cells transfusion today; will return on Friday for a lab recheck and to receive a second unit of blood transfusion as well.  She was also advised regarding her low platelet count to avoid any falls or other trauma.  Patient was advised to call/return of her directly to the emergency department for any worsening symptoms whatsoever.

## 2016-11-07 NOTE — Patient Instructions (Signed)
Blood Transfusion , Adult A blood transfusion is a procedure in which you receive donated blood, including plasma, platelets, and red blood cells, through an IV tube. You may need a blood transfusion because of illness, surgery, or injury. The blood may come from a donor. You may also be able to donate blood for yourself (autologous blood donation) before a surgery if you know that you might require a blood transfusion. The blood given in a transfusion is made up of different types of cells. You may receive:  Red blood cells. These carry oxygen to the cells in the body.  White blood cells. These help you fight infections.  Platelets. These help your blood to clot.  Plasma. This is the liquid part of your blood and it helps with fluid imbalances. If you have hemophilia or another clotting disorder, you may also receive other types of blood products. Tell a health care provider about:  Any allergies you have.  All medicines you are taking, including vitamins, herbs, eye drops, creams, and over-the-counter medicines.  Any problems you or family members have had with anesthetic medicines.  Any blood disorders you have.  Any surgeries you have had.  Any medical conditions you have, including any recent fever or cold symptoms.  Whether you are pregnant or may be pregnant.  Any previous reactions you have had during a blood transfusion. What are the risks? Generally, this is a safe procedure. However, problems may occur, including:  Having an allergic reaction to something in the donated blood. Hives and itching may be symptoms of this type of reaction.  Fever. This may be a reaction to the white blood cells in the transfused blood. Nausea or chest pain may accompany a fever.  Iron overload. This can happen from having many transfusions.  Transfusion-related acute lung injury (TRALI). This is a rare reaction that causes lung damage. The cause is not known.TRALI can occur within hours  of a transfusion or several days later.  Sudden (acute) or delayed hemolytic reactions. This happens if your blood does not match the cells in your transfusion. Your body's defense system (immune system) may try to attack the new cells. This complication is rare. The symptoms include fever, chills, nausea, and low back pain or chest pain.  Infection or disease transmission. This is rare. What happens before the procedure?  You will have a blood test to determine your blood type. This is necessary to know what kind of blood your body will accept and to match it to the donor blood.  If you are going to have a planned surgery, you may be able to do an autologous blood donation. This may be done in case you need to have a transfusion.  If you have had an allergic reaction to a transfusion in the past, you may be given medicine to help prevent a reaction. This medicine may be given to you by mouth or through an IV tube.  You will have your temperature, blood pressure, and pulse monitored before the transfusion.  Follow instructions from your health care provider about eating and drinking restrictions.  Ask your health care provider about:  Changing or stopping your regular medicines. This is especially important if you are taking diabetes medicines or blood thinners.  Taking medicines such as aspirin and ibuprofen. These medicines can thin your blood. Do not take these medicines before your procedure if your health care provider instructs you not to. What happens during the procedure?  An IV tube will be   inserted into one of your veins.  The bag of donated blood will be attached to your IV tube. The blood will then enter through your vein.  Your temperature, blood pressure, and pulse will be monitored regularly during the transfusion. This monitoring is done to detect early signs of a transfusion reaction.  If you have any signs or symptoms of a reaction, your transfusion will be stopped and  you may be given medicine.  When the transfusion is complete, your IV tube will be removed.  Pressure may be applied to the IV site for a few minutes.  A bandage (dressing) will be applied. The procedure may vary among health care providers and hospitals. What happens after the procedure?  Your temperature, blood pressure, heart rate, breathing rate, and blood oxygen level will be monitored often.  Your blood may be tested to see how you are responding to the transfusion.  You may be warmed with fluids or blankets to maintain a normal body temperature. Summary  A blood transfusion is a procedure in which you receive donated blood, including plasma, platelets, and red blood cells, through an IV tube.  Your temperature, blood pressure, and pulse will be monitored before, during, and after the transfusion.  Your blood may be tested after the transfusion to see how your body has responded. This information is not intended to replace advice given to you by your health care provider. Make sure you discuss any questions you have with your health care provider. Document Released: 08/10/2000 Document Revised: 05/10/2016 Document Reviewed: 05/10/2016 Elsevier Interactive Patient Education  2017 Elsevier Inc.   Dehydration, Adult Dehydration is a condition in which there is not enough fluid or water in the body. This happens when you lose more fluids than you take in. Important organs, such as the kidneys, brain, and heart, cannot function without a proper amount of fluids. Any loss of fluids from the body can lead to dehydration. Dehydration can range from mild to severe. This condition should be treated right away to prevent it from becoming severe. What are the causes? This condition may be caused by:  Vomiting.  Diarrhea.  Excessive sweating, such as from heat exposure or exercise.  Not drinking enough fluid, especially:  When ill.  While doing activity that requires a lot of  energy.  Excessive urination.  Fever.  Infection.  Certain medicines, such as medicines that cause the body to lose excess fluid (diuretics).  Inability to access safe drinking water.  Reduced physical ability to get adequate water and food. What increases the risk? This condition is more likely to develop in people:  Who have a poorly controlled long-term (chronic) illness, such as diabetes, heart disease, or kidney disease.  Who are age 65 or older.  Who are disabled.  Who live in a place with high altitude.  Who play endurance sports. What are the signs or symptoms? Symptoms of mild dehydration may include:  Thirst.  Dry lips.  Slightly dry mouth.  Dry, warm skin.  Dizziness. Symptoms of moderate dehydration may include:  Very dry mouth.  Muscle cramps.  Dark urine. Urine may be the color of tea.  Decreased urine production.  Decreased tear production.  Heartbeat that is irregular or faster than normal (palpitations).  Headache.  Light-headedness, especially when you stand up from a sitting position.  Fainting (syncope). Symptoms of severe dehydration may include:  Changes in skin, such as:  Cold and clammy skin.  Blotchy (mottled) or pale skin.  Skin that   does not quickly return to normal after being lightly pinched and released (poor skin turgor).  Changes in body fluids, such as:  Extreme thirst.  No tear production.  Inability to sweat when body temperature is high, such as in hot weather.  Very little urine production.  Changes in vital signs, such as:  Weak pulse.  Pulse that is more than 100 beats a minute when sitting still.  Rapid breathing.  Low blood pressure.  Other changes, such as:  Sunken eyes.  Cold hands and feet.  Confusion.  Lack of energy (lethargy).  Difficulty waking up from sleep.  Short-term weight loss.  Unconsciousness. How is this diagnosed? This condition is diagnosed based on your  symptoms and a physical exam. Blood and urine tests may be done to help confirm the diagnosis. How is this treated? Treatment for this condition depends on the severity. Mild or moderate dehydration can often be treated at home. Treatment should be started right away. Do not wait until dehydration becomes severe. Severe dehydration is an emergency and it needs to be treated in a hospital. Treatment for mild dehydration may include:  Drinking more fluids.  Replacing salts and minerals in your blood (electrolytes) that you may have lost. Treatment for moderate dehydration may include:  Drinking an oral rehydration solution (ORS). This is a drink that helps you replace fluids and electrolytes (rehydrate). It can be found at pharmacies and retail stores. Treatment for severe dehydration may include:  Receiving fluids through an IV tube.  Receiving an electrolyte solution through a feeding tube that is passed through your nose and into your stomach (nasogastric tube, or NG tube).  Correcting any abnormalities in electrolytes.  Treating the underlying cause of dehydration. Follow these instructions at home:  If directed by your health care provider, drink an ORS:  Make an ORS by following instructions on the package.  Start by drinking small amounts, about  cup (120 mL) every 5-10 minutes.  Slowly increase how much you drink until you have taken the amount recommended by your health care provider.  Drink enough clear fluid to keep your urine clear or pale yellow. If you were told to drink an ORS, finish the ORS first, then start slowly drinking other clear fluids. Drink fluids such as:  Water. Do not drink only water. Doing that can lead to having too little salt (sodium) in the body (hyponatremia).  Ice chips.  Fruit juice that you have added water to (diluted fruit juice).  Low-calorie sports drinks.  Avoid:  Alcohol.  Drinks that contain a lot of sugar. These include  high-calorie sports drinks, fruit juice that is not diluted, and soda.  Caffeine.  Foods that are greasy or contain a lot of fat or sugar.  Take over-the-counter and prescription medicines only as told by your health care provider.  Do not take sodium tablets. This can lead to having too much sodium in the body (hypernatremia).  Eat foods that contain a healthy balance of electrolytes, such as bananas, oranges, potatoes, tomatoes, and spinach.  Keep all follow-up visits as told by your health care provider. This is important. Contact a health care provider if:  You have abdominal pain that:  Gets worse.  Stays in one area (localizes).  You have a rash.  You have a stiff neck.  You are more irritable than usual.  You are sleepier or more difficult to wake up than usual.  You feel weak or dizzy.  You feel very thirsty.    You have urinated only a small amount of very dark urine over 6-8 hours. Get help right away if:  You have symptoms of severe dehydration.  You cannot drink fluids without vomiting.  Your symptoms get worse with treatment.  You have a fever.  You have a severe headache.  You have vomiting or diarrhea that:  Gets worse.  Does not go away.  You have blood or green matter (bile) in your vomit.  You have blood in your stool. This may cause stool to look black and tarry.  You have not urinated in 6-8 hours.  You faint.  Your heart rate while sitting still is over 100 beats a minute.  You have trouble breathing. This information is not intended to replace advice given to you by your health care provider. Make sure you discuss any questions you have with your health care provider. Document Released: 08/13/2005 Document Revised: 03/09/2016 Document Reviewed: 10/07/2015 Elsevier Interactive Patient Education  2017 Elsevier Inc.   

## 2016-11-07 NOTE — Telephone Encounter (Signed)
"  I received neulasta injection a week ago.  My balance is off.  My HGB may be low.  I have to hold on to make it.  I'm afraid I may fall.  I have no energy.  My B/P = 142/70's a few days ago.  No chest pain.  I'm always short of breath and no worse than usual.  I eat small portions but I eat.  I've slept well the past few nights." Dr. Benay Spice notified of this call.  Verbal order received and read back for lab and [facility].  Scheduling message sent.  Instructed Kelly Ray to come in today but have her husband drive.          Requested lab at 12:30 pm today with [facility] visit at 1:00 pm.

## 2016-11-07 NOTE — Progress Notes (Signed)
SYMPTOM MANAGEMENT CLINIC    Chief Complaint: Pancytopenia, dehydration  HPI:  Kelly Ray 76 y.o. female diagnosed with lymphoma.  Patient just completed R CHOP chemotherapy regimen.     Cancer ([place]) (Resolved)    Initial Diagnosis    Cancer ([place])       Review of Systems  Constitutional: Positive for chills and malaise/fatigue.  Neurological: Positive for dizziness and weakness.  All other systems reviewed and are negative.   Past Medical History:  Diagnosis Date  . Arthritis    Osteoarthritis  . B-cell lymphoma ([place]) 05/04/11   Large B-cell lymphoma  . Cancer ([place])    NHL  . Hx of radiation therapy 08/17/13- 09/03/13   right lower extremity- lymphoma  . Hypertension   . S/P knee replacement    Left knee  . Thyroid disease    Hyperthyroidism    Past Surgical History:  Procedure Laterality Date  . IR GENERIC HISTORICAL  07/13/2016   IR US GUIDE VASC ACCESS RIGHT 07/13/2016 [facility]  . IR GENERIC HISTORICAL  07/13/2016   IR FLUORO GUIDE PORT INSERTION RIGHT 07/13/2016 [facility]  . NECK LESION BIOPSY Right 05/04/11   node biopsies  . 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    is allergic to lisinopril.  Allergies as of 11/07/2016      Reactions   Lisinopril Cough      Medication List       Accurate as of 11/07/16  4:26 PM. Always use your most recent med list.          ciprofloxacin 500 MG tablet Commonly known as:  CIPRO Take 1 tablet (500 mg total) by mouth 2 (two) times daily.   etoposide 50 MG capsule Commonly known as:  VEPESID Take 4 capsules (200 mg total) by mouth daily. Takes on days 2 and 3 of each 21 day cycle for 2 cycles   levothyroxine 112 MCG  tablet Commonly known as:  SYNTHROID, LEVOTHROID Take 1 tablet daily   lidocaine-prilocaine cream Commonly known as:  EMLA Apply 1 application topically as needed.   losartan-hydrochlorothiazide 50-12.5 MG tablet Commonly known as:  HYZAAR Take 1/2 tab PO QD in AM.   metoprolol tartrate 25 MG tablet Commonly known as:  LOPRESSOR Take 1 tablet (25 mg total) by mouth 2 (two) times daily.   naproxen sodium 220 MG tablet Commonly known as:  ANAPROX Take 220 mg by mouth 2 (two) times daily with a meal.   potassium chloride SA 20 MEQ tablet Commonly known as:  K-DUR,KLOR-CON Take 1 tablet (20 mEq total) by mouth daily.   pravastatin 40 MG tablet Commonly known as:  PRAVACHOL Take 1 tablet (40 mg total) by mouth at bedtime.   predniSONE 20 MG tablet Commonly known as:  DELTASONE Take 4 tablets (80 mg total) by mouth daily with breakfast.  Take for four days beginning day 2, day after chemotherapy.   prochlorperazine 10 MG tablet Commonly known as:  COMPAZINE Take 1 tablet (10 mg total) by mouth every 6 (six) hours as needed for nausea or vomiting.   sucralfate 1 GM/10ML suspension Commonly known as:  CARAFATE Take 10 mLs (1 g total) by mouth 4 (four) times daily -  with meals  and at bedtime.        PHYSICAL EXAMINATION  Oncology Vitals 11/07/2016 11/07/2016  Height - -  Weight - -  Weight (lbs) - -  BMI (kg/m2) - -  Temp 97.5 98.1  Pulse 83 87  Resp 16 16  Resp (Historical as of 03/27/12) - -  SpO2 97 93  BSA (m2) - -   BP Readings from Last 2 Encounters:  11/07/16 (!) 94/39  11/07/16 (!) 95/56    Physical Exam  Constitutional: She is oriented to person, place, and time. She appears malnourished and dehydrated. She appears unhealthy. She appears cachectic.  HENT:  Head: Normocephalic and atraumatic.  Mouth/Throat: Oropharynx is clear and moist.  Eyes: Conjunctivae and EOM are normal. Pupils are equal, round, and reactive to light. Right eye exhibits no  discharge. Left eye exhibits no discharge. No scleral icterus.  Neck: Normal range of motion. Neck supple. No JVD present. No tracheal deviation present. No thyromegaly present.  Cardiovascular: Normal rate, regular rhythm, normal heart sounds and intact distal pulses.   Pulmonary/Chest: Effort normal and breath sounds normal. No respiratory distress. She has no wheezes. She has no rales. She exhibits no tenderness.  Abdominal: Soft. Bowel sounds are normal. She exhibits no distension and no mass. There is no tenderness. There is no rebound and no guarding.  Musculoskeletal: Normal range of motion. She exhibits no edema or tenderness.  Lymphadenopathy:    She has no cervical adenopathy.  Neurological: She is alert and oriented to person, place, and time. Gait normal.  Skin: Skin is warm and dry. No rash noted. No erythema. There is pallor.  Psychiatric: Affect normal.  Nursing note and vitals reviewed.   LABORATORY DATA:. Hospital Outpatient Visit on 11/07/2016  Component Date Value Ref Range Status  . Order Confirmation 11/07/2016 ORDER PROCESSED BY BLOOD BANK   Final  . ABO/RH(D) 11/07/2016 A POS   Final  . Antibody Screen 11/07/2016 NEG   Final  . Sample Expiration 11/07/2016 11/10/2016   Final  . Unit Number 11/07/2016 [id number]   Final  . Blood Component Type 11/07/2016 RED CELLS,LR   Final  . Unit division 11/07/2016 00   Final  . Status of Unit 11/07/2016 ISSUED   Final  . Transfusion Status 11/07/2016 OK TO TRANSFUSE   Final  . Crossmatch Result 11/07/2016 Compatible   Final  . ISSUE DATE / TIME 11/07/2016 [id number]   Final  . Blood Product Unit Number 11/07/2016 [id number]   Final  . PRODUCT CODE 11/07/2016 [id number]   Final  . Unit Type and Rh 11/07/2016 6200   Final  . Blood Product Expiration Date 11/07/2016 [id number]   Final  Appointment on 11/07/2016  Component Date Value Ref Range Status  . Sodium 11/07/2016 135* 136 - 145 mEq/L Final  . Potassium  11/07/2016 3.6  3.5 - 5.1 mEq/L Final  . Chloride 11/07/2016 97* 98 - 109 mEq/L Final  . CO2 11/07/2016 28  22 - 29 mEq/L Final  . Glucose 11/07/2016 131  70 - 140 mg/dl Final  . BUN 11/07/2016 20.4  7.0 - 26.0 mg/dL Final  . Creatinine 11/07/2016 0.8  0.6 - 1.1 mg/dL Final  . Total Bilirubin 11/07/2016 1.62* 0.20 - 1.20 mg/dL Final  . Alkaline Phosphatase 11/07/2016 68  40 - 150 U/L Final  . AST 11/07/2016 11  5 - 34 U/L Final  . ALT 11/07/2016 11  0 - 55 U/L Final  . Total Protein 11/07/2016 5.7* 6.4 - 8.3 g/dL  Final  . Albumin 11/07/2016 3.2* 3.5 - 5.0 g/dL Final  . Calcium 11/07/2016 9.3  8.4 - 10.4 mg/dL Final  . Anion Gap 11/07/2016 9  3 - 11 mEq/L Final  . EGFR 11/07/2016 74* >90 ml/min/1.73 m2 Final  . WBC 11/07/2016 0.9* 3.9 - 10.3 10e3/uL Final  . NEUT# 11/07/2016 0.1* 1.5 - 6.5 10e3/uL Final  . HGB 11/07/2016 7.4* 11.6 - 15.9 g/dL Final  . HCT 11/07/2016 21.8* 34.8 - 46.6 % Final  . Platelets 11/07/2016 46* 145 - 400 10e3/uL Final  . MCV 11/07/2016 96.5  79.5 - 101.0 fL Final  . MCH 11/07/2016 32.7  25.1 - 34.0 pg Final  . MCHC 11/07/2016 33.9  31.5 - 36.0 g/dL Final  . RBC 11/07/2016 2.26* 3.70 - 5.45 10e6/uL Final  . RDW 11/07/2016 15.0* 11.2 - 14.5 % Final  . lymph# 11/07/2016 0.3* 0.9 - 3.3 10e3/uL Final  . MONO# 11/07/2016 0.5  0.1 - 0.9 10e3/uL Final  . Eosinophils Absolute 11/07/2016 0.0  0.0 - 0.5 10e3/uL Final  . Basophils Absolute 11/07/2016 0.0  0.0 - 0.1 10e3/uL Final  . NEUT% 11/07/2016 11.9* 38.4 - 76.8 % Final  . LYMPH% 11/07/2016 28.3  14.0 - 49.7 % Final  . MONO% 11/07/2016 57.6* 0.0 - 14.0 % Final  . EOS% 11/07/2016 2.2  0.0 - 7.0 % Final  . BASO% 11/07/2016 0.0  0.0 - 2.0 % Final  . nRBC 11/07/2016 0  0 - 0 % Final    RADIOGRAPHIC STUDIES: No results found.  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  Large CHOP chemotherapy on 10/29/2016.  She also received  Neulasta for growth factor support following this last cycle of chemotherapy.  Patient presented to the [facility] today with complaint of significant fatigue and weakness.  She was also slightly short of breath with any exertion.  She had been experiencing some chills but no fever.  See further notes for details of today's visit.  Patient will return to the [facility] on Friday, 11/09/2016 for labs, a distant with symptom management clinic and a blood transfusion times one unit.  Also, patient has been scheduled for labs, flush, and a visit with Dr. Benay Spice on 12/10/2016.  Dehydration Patient presented to the [facility] today with increased fatigue and weakness.  She's had decreased oral intake and feels dehydrated as well.  Blood pressure was down to 95/56 today.  Patient has a history of hypertension; and confirm that she did take her losartan/HCTZ earlier this morning.  As directed.  Patient's sodium was down to 135 today.  She will receive 500 ML's Normal Saline IV Fluid Rehydration While the [facility] Today; in Addition to One Unit of Blood Transfusion.  Antineoplastic chemotherapy induced pancytopenia (CODE) ([place]) Patient received her last cycle of R CHOP chemotherapy on 10/29/2016.  She also received Neulasta for growth factor support at that time.  Blood counts today revealed WBC 0.9, ANC 0.1, hemoglobin 7.4, and platelet count 46.  Patient feels increasingly fatigued and weak.  He reaches also slightly short of breath with any type of exertion.  She has had some chills but no fever.  She denies any URI or UTI symptoms.  She denies any cough.  Of note-patient's bilirubin is elevated; but this could be secondary to patient's dehydration.  Will continue to monitor bilirubin closely.  Dr. Benay Spice in 2 visit.  Patient as well today.  He has recommended that patient received Cipro 500 mg twice daily prophylactically due to the neutropenia.  Reviewed all neutropenia precautions with  patient and her husband today.  Patient appears weak and fatigued; she does not appear toxic today.  Temperature was 97.7.  Hopefully, the Neulasta growth factor support will increase the white count within the next few days.  Patient received 1 unit packed red blood cells transfusion today; will return on Friday for a lab recheck and to receive a second unit of blood transfusion as well.  She was also advised regarding her low platelet count to avoid any falls or other trauma.  Patient was advised to call/return of her directly to the emergency department for any worsening symptoms whatsoever.   Patient stated understanding of all instructions; and was in agreement with this plan of care. The patient knows to call the clinic with any problems, questions or concerns.   Total time spent with patient was 40 minutes;  with greater than 75 percent of that time spent in face to face counseling regarding patient's symptoms,  and coordination of care and follow up.  Disclaimer:This dictation was prepared with Dragon/digital dictation along with [employer]. Any transcriptional errors that result from this process are unintentional.  Drue Second, NP 11/07/2016  This was a shared visit with Drue Second. Ms. Bumpus was interviewed and examined. She has developed severe pancytopenia following the last planned cycle of chemotherapy. She will be transfused with packed red blood cells and begin prophylactic ciprofloxacin. She will return for a CBC on 11/09/2016.  Julieanne Manson, M.D.

## 2016-11-08 ENCOUNTER — Other Ambulatory Visit: Payer: Self-pay | Admitting: Nurse Practitioner

## 2016-11-08 ENCOUNTER — Telehealth: Payer: Self-pay | Admitting: Oncology

## 2016-11-08 ENCOUNTER — Other Ambulatory Visit: Payer: Medicare Other

## 2016-11-08 NOTE — Telephone Encounter (Signed)
Spoke with patient re appointments for 3/16.

## 2016-11-09 ENCOUNTER — Other Ambulatory Visit (HOSPITAL_BASED_OUTPATIENT_CLINIC_OR_DEPARTMENT_OTHER): Payer: Medicare Other

## 2016-11-09 ENCOUNTER — Ambulatory Visit (HOSPITAL_BASED_OUTPATIENT_CLINIC_OR_DEPARTMENT_OTHER): Payer: Medicare Other

## 2016-11-09 ENCOUNTER — Ambulatory Visit (HOSPITAL_BASED_OUTPATIENT_CLINIC_OR_DEPARTMENT_OTHER): Payer: Medicare Other | Admitting: Nurse Practitioner

## 2016-11-09 ENCOUNTER — Encounter: Payer: Self-pay | Admitting: Nurse Practitioner

## 2016-11-09 VITALS — BP 87/50 | HR 97 | Temp 97.7°F | Resp 18 | Ht 61.0 in | Wt 175.3 lb

## 2016-11-09 DIAGNOSIS — I959 Hypotension, unspecified: Secondary | ICD-10-CM

## 2016-11-09 DIAGNOSIS — C8335 Diffuse large B-cell lymphoma, lymph nodes of inguinal region and lower limb: Secondary | ICD-10-CM

## 2016-11-09 DIAGNOSIS — T451X5A Adverse effect of antineoplastic and immunosuppressive drugs, initial encounter: Secondary | ICD-10-CM

## 2016-11-09 DIAGNOSIS — E860 Dehydration: Secondary | ICD-10-CM | POA: Diagnosis not present

## 2016-11-09 DIAGNOSIS — E876 Hypokalemia: Secondary | ICD-10-CM | POA: Diagnosis not present

## 2016-11-09 DIAGNOSIS — D6481 Anemia due to antineoplastic chemotherapy: Secondary | ICD-10-CM

## 2016-11-09 DIAGNOSIS — D61810 Antineoplastic chemotherapy induced pancytopenia: Secondary | ICD-10-CM

## 2016-11-09 LAB — COMPREHENSIVE METABOLIC PANEL
ALBUMIN: 3 g/dL — AB (ref 3.5–5.0)
ALK PHOS: 92 U/L (ref 40–150)
ALT: 14 U/L (ref 0–55)
ANION GAP: 11 meq/L (ref 3–11)
AST: 16 U/L (ref 5–34)
BUN: 15.9 mg/dL (ref 7.0–26.0)
CO2: 28 meq/L (ref 22–29)
Calcium: 9 mg/dL (ref 8.4–10.4)
Chloride: 97 mEq/L — ABNORMAL LOW (ref 98–109)
Creatinine: 0.9 mg/dL (ref 0.6–1.1)
EGFR: 67 mL/min/{1.73_m2} — AB (ref >90)
GLUCOSE: 123 mg/dL (ref 70–140)
POTASSIUM: 3.1 meq/L — AB (ref 3.5–5.1)
Sodium: 135 mEq/L — ABNORMAL LOW (ref 136–145)
TOTAL PROTEIN: 5.9 g/dL — AB (ref 6.4–8.3)
Total Bilirubin: 1.41 mg/dL — ABNORMAL HIGH (ref 0.20–1.20)

## 2016-11-09 LAB — CBC WITH DIFFERENTIAL/PLATELET
BASO%: 0.2 % (ref 0.0–2.0)
BASOS ABS: 0 10*3/uL (ref 0.0–0.1)
EOS%: 1 % (ref 0.0–7.0)
Eosinophils Absolute: 0.1 10*3/uL (ref 0.0–0.5)
HCT: 26.6 % — ABNORMAL LOW (ref 34.8–46.6)
HGB: 9.1 g/dL — ABNORMAL LOW (ref 11.6–15.9)
LYMPH%: 9 % — ABNORMAL LOW (ref 14.0–49.7)
MCH: 32.6 pg (ref 25.1–34.0)
MCHC: 34.2 g/dL (ref 31.5–36.0)
MCV: 95.3 fL (ref 79.5–101.0)
MONO#: 2 10*3/uL — AB (ref 0.1–0.9)
MONO%: 38.9 % — AB (ref 0.0–14.0)
NEUT#: 2.7 10*3/uL (ref 1.5–6.5)
NEUT%: 50.9 % (ref 38.4–76.8)
PLATELETS: 85 10*3/uL — AB (ref 145–400)
RBC: 2.79 10*6/uL — AB (ref 3.70–5.45)
RDW: 15.3 % — ABNORMAL HIGH (ref 11.2–14.5)
WBC: 5.2 10*3/uL (ref 3.9–10.3)
lymph#: 0.5 10*3/uL — ABNORMAL LOW (ref 0.9–3.3)

## 2016-11-09 LAB — PREPARE RBC (CROSSMATCH)

## 2016-11-09 MED ORDER — POTASSIUM CHLORIDE CRYS ER 20 MEQ PO TBCR
40.0000 meq | EXTENDED_RELEASE_TABLET | Freq: Once | ORAL | Status: AC
  Administered 2016-11-09: 40 meq via ORAL
  Filled 2016-11-09: qty 2

## 2016-11-09 MED ORDER — SODIUM CHLORIDE 0.9% FLUSH
10.0000 mL | INTRAVENOUS | Status: AC | PRN
  Administered 2016-11-09: 10 mL
  Filled 2016-11-09: qty 10

## 2016-11-09 MED ORDER — DIPHENHYDRAMINE HCL 25 MG PO CAPS
ORAL_CAPSULE | ORAL | Status: AC
  Filled 2016-11-09: qty 1

## 2016-11-09 MED ORDER — SODIUM CHLORIDE 0.9 % IV SOLN
250.0000 mL | Freq: Once | INTRAVENOUS | Status: AC
  Administered 2016-11-09: 250 mL via INTRAVENOUS

## 2016-11-09 MED ORDER — SODIUM CHLORIDE 0.9 % IV SOLN
INTRAVENOUS | Status: AC
  Administered 2016-11-09: 14:00:00 via INTRAVENOUS

## 2016-11-09 MED ORDER — ACETAMINOPHEN 325 MG PO TABS
650.0000 mg | ORAL_TABLET | Freq: Once | ORAL | Status: AC
  Administered 2016-11-09: 650 mg via ORAL

## 2016-11-09 MED ORDER — DIPHENHYDRAMINE HCL 25 MG PO CAPS
25.0000 mg | ORAL_CAPSULE | Freq: Once | ORAL | Status: AC
  Administered 2016-11-09: 25 mg via ORAL

## 2016-11-09 MED ORDER — HEPARIN SOD (PORK) LOCK FLUSH 100 UNIT/ML IV SOLN
500.0000 [IU] | Freq: Every day | INTRAVENOUS | Status: AC | PRN
Start: 2016-11-09 — End: 2016-11-09
  Administered 2016-11-09: 500 [IU]
  Filled 2016-11-09: qty 5

## 2016-11-09 MED ORDER — ACETAMINOPHEN 325 MG PO TABS
ORAL_TABLET | ORAL | Status: AC
  Filled 2016-11-09: qty 2

## 2016-11-09 NOTE — Patient Instructions (Signed)
Blood Transfusion , Adult A blood transfusion is a procedure in which you receive donated blood, including plasma, platelets, and red blood cells, through an IV tube. You may need a blood transfusion because of illness, surgery, or injury. The blood may come from a donor. You may also be able to donate blood for yourself (autologous blood donation) before a surgery if you know that you might require a blood transfusion. The blood given in a transfusion is made up of different types of cells. You may receive:  Red blood cells. These carry oxygen to the cells in the body.  White blood cells. These help you fight infections.  Platelets. These help your blood to clot.  Plasma. This is the liquid part of your blood and it helps with fluid imbalances. If you have hemophilia or another clotting disorder, you may also receive other types of blood products. Tell a health care provider about:  Any allergies you have.  All medicines you are taking, including vitamins, herbs, eye drops, creams, and over-the-counter medicines.  Any problems you or family members have had with anesthetic medicines.  Any blood disorders you have.  Any surgeries you have had.  Any medical conditions you have, including any recent fever or cold symptoms.  Whether you are pregnant or may be pregnant.  Any previous reactions you have had during a blood transfusion. What are the risks? Generally, this is a safe procedure. However, problems may occur, including:  Having an allergic reaction to something in the donated blood. Hives and itching may be symptoms of this type of reaction.  Fever. This may be a reaction to the white blood cells in the transfused blood. Nausea or chest pain may accompany a fever.  Iron overload. This can happen from having many transfusions.  Transfusion-related acute lung injury (TRALI). This is a rare reaction that causes lung damage. The cause is not known.TRALI can occur within hours  of a transfusion or several days later.  Sudden (acute) or delayed hemolytic reactions. This happens if your blood does not match the cells in your transfusion. Your body's defense system (immune system) may try to attack the new cells. This complication is rare. The symptoms include fever, chills, nausea, and low back pain or chest pain.  Infection or disease transmission. This is rare. What happens before the procedure?  You will have a blood test to determine your blood type. This is necessary to know what kind of blood your body will accept and to match it to the donor blood.  If you are going to have a planned surgery, you may be able to do an autologous blood donation. This may be done in case you need to have a transfusion.  If you have had an allergic reaction to a transfusion in the past, you may be given medicine to help prevent a reaction. This medicine may be given to you by mouth or through an IV tube.  You will have your temperature, blood pressure, and pulse monitored before the transfusion.  Follow instructions from your health care provider about eating and drinking restrictions.  Ask your health care provider about:  Changing or stopping your regular medicines. This is especially important if you are taking diabetes medicines or blood thinners.  Taking medicines such as aspirin and ibuprofen. These medicines can thin your blood. Do not take these medicines before your procedure if your health care provider instructs you not to. What happens during the procedure?  An IV tube will be   inserted into one of your veins.  The bag of donated blood will be attached to your IV tube. The blood will then enter through your vein.  Your temperature, blood pressure, and pulse will be monitored regularly during the transfusion. This monitoring is done to detect early signs of a transfusion reaction.  If you have any signs or symptoms of a reaction, your transfusion will be stopped and  you may be given medicine.  When the transfusion is complete, your IV tube will be removed.  Pressure may be applied to the IV site for a few minutes.  A bandage (dressing) will be applied. The procedure may vary among health care providers and hospitals. What happens after the procedure?  Your temperature, blood pressure, heart rate, breathing rate, and blood oxygen level will be monitored often.  Your blood may be tested to see how you are responding to the transfusion.  You may be warmed with fluids or blankets to maintain a normal body temperature. Summary  A blood transfusion is a procedure in which you receive donated blood, including plasma, platelets, and red blood cells, through an IV tube.  Your temperature, blood pressure, and pulse will be monitored before, during, and after the transfusion.  Your blood may be tested after the transfusion to see how your body has responded. This information is not intended to replace advice given to you by your health care provider. Make sure you discuss any questions you have with your health care provider. Document Released: 08/10/2000 Document Revised: 05/10/2016 Document Reviewed: 05/10/2016 Elsevier Interactive Patient Education  2017 [employer]  

## 2016-11-09 NOTE — Assessment & Plan Note (Signed)
;   it has been taking losartan/HCTZ and metoprolol twice daily  As previously directed.  Patient states that she takes metoprolol for rate control.  Patient's blood pressure on Wednesday was 95/56 initially; and today's initial blood pressure was even lower at 87/50.  She continues to feeltired and has minimal energy.  She denies any dizziness or near syncopal events.  Reviewed all findings with Dr. Benay Spice; he recommended the patient completely discontinue the losartan/HCTZ; and to cut the metoprolol dosage to half.  Therefore-patient will take metoprolol one half of a tabletin the morning and one half of a tablet in the evening.  Also, patient will check her blood pressure at home and keep a blood pressure log.

## 2016-11-09 NOTE — Assessment & Plan Note (Signed)
Patient completed her final cycle 6 R CHOP chemotherapy on 10/29/2016.  She also received Neulasta for growth factor support following this last cycle of chemotherapy.    See further notes for details  Of today's visit.  Also, patient has been scheduled for labs, flush, and a visit with Dr. Benay Spice on 12/10/2016.

## 2016-11-09 NOTE — Assessment & Plan Note (Signed)
Patient's potassium was down to 3.1 today.  Patient states she continues to take potassium 20 mg once once daily as previously directed.  Patient was given an additional potassium 40 mEq orally while the cancer Center today.  Will continue to monitor.

## 2016-11-09 NOTE — Progress Notes (Signed)
SYMPTOM MANAGEMENT CLINIC   Chief Complaint:  Dehydration, anemia, hypokalemia    HPI:  Kelly Ray 76 y.o. female diagnosed with  Lymphoma.   Has completed R-CHOP chemotherapy regimen.     Cancer ([place]) (Resolved)    Initial Diagnosis    Cancer ([place])       Review of Systems  Constitutional: Positive for malaise/fatigue and weight loss.  Neurological: Positive for weakness.  All other systems reviewed and are negative.   Past Medical History:  Diagnosis Date  . Arthritis    Osteoarthritis  . B-cell lymphoma ([place]) 05/04/11   Large B-cell lymphoma  . Cancer ([place])    NHL  . Hx of radiation therapy 08/17/13- 09/03/13   right lower extremity- lymphoma  . Hypertension   . S/P knee replacement    Left knee  . Thyroid disease    Hyperthyroidism    Past Surgical History:  Procedure Laterality Date  . IR GENERIC HISTORICAL  07/13/2016   IR US GUIDE VASC ACCESS RIGHT 07/13/2016 [facility]  . IR GENERIC HISTORICAL  07/13/2016   IR FLUORO GUIDE PORT INSERTION RIGHT 07/13/2016 [facility]  . NECK LESION BIOPSY Right 05/04/11   node biopsies  . 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    is allergic to lisinopril.  Allergies as of 11/09/2016      Reactions   Lisinopril Cough      Medication List       Accurate as of 11/09/16  3:41 PM. Always use your most recent med list.          ciprofloxacin 500 MG tablet Commonly known as:  CIPRO Take 1 tablet (500 mg total) by mouth 2 (two) times daily.   etoposide 50 MG capsule Commonly known as:  VEPESID Take 4 capsules (200 mg total) by mouth daily. Takes on days 2 and 3 of each 21 day cycle for 2 cycles   levothyroxine 112  MCG tablet Commonly known as:  SYNTHROID, LEVOTHROID Take 1 tablet daily   lidocaine-prilocaine cream Commonly known as:  EMLA Apply 1 application topically as needed.   losartan-hydrochlorothiazide 50-12.5 MG tablet Commonly known as:  HYZAAR Take 1/2 tab PO QD in AM.   metoprolol tartrate 25 MG tablet Commonly known as:  LOPRESSOR Take 1 tablet (25 mg total) by mouth 2 (two) times daily.   naproxen sodium 220 MG tablet Commonly known as:  ANAPROX Take 220 mg by mouth 2 (two) times daily with a meal.   potassium chloride SA 20 MEQ tablet Commonly known as:  K-DUR,KLOR-CON Take 1 tablet (20 mEq total) by mouth daily.   pravastatin 40 MG tablet Commonly known as:  PRAVACHOL Take 1 tablet (40 mg total) by mouth at bedtime.   predniSONE 20 MG tablet Commonly known as:  DELTASONE Take 4 tablets (80 mg total) by mouth daily with breakfast.  Take for four days beginning day 2, day after chemotherapy.   prochlorperazine 10 MG tablet Commonly known as:  COMPAZINE Take 1 tablet (10 mg total) by mouth every 6 (six) hours as needed for nausea or vomiting.   sucralfate 1 GM/10ML suspension Commonly known as:  CARAFATE Take 10 mLs (1 g total) by mouth 4 (four) times daily -  with meals and at bedtime.        PHYSICAL EXAMINATION  Oncology Vitals 11/09/2016 11/09/2016  Height - 155 cm  Weight - 79.516 kg  Weight (lbs) - 175 lbs 5 oz  BMI (kg/m2) - 33.12 kg/m2  Temp 97.9 97.7  Pulse 71 97  Resp 18 18  Resp (Historical as of 03/27/12) - -  SpO2 100 98  BSA (m2) - 1.85 m2   BP Readings from Last 2 Encounters:  11/09/16 (!) 94/57  11/09/16 (!) 87/50    Physical Exam  Constitutional: She is oriented to person, place, and time. She appears malnourished and dehydrated. She appears unhealthy. She appears cachectic.  HENT:  Head: Normocephalic and atraumatic.  Eyes: Conjunctivae and EOM are normal. Pupils are equal, round, and reactive to light.  Neck: Normal range of motion.    Pulmonary/Chest: Effort normal. No respiratory distress.  Musculoskeletal: Normal range of motion.  Neurological: She is alert and oriented to person, place, and time. Gait normal.  Skin: Skin is warm and dry. There is pallor.  Psychiatric: Affect normal.  Nursing note and vitals reviewed.   LABORATORY DATA:. Infusion on 11/09/2016  Component Date Value Ref Range Status  . Order Confirmation 11/09/2016 ORDER PROCESSED BY BLOOD BANK   Final  Appointment on 11/09/2016  Component Date Value Ref Range Status  . WBC 11/09/2016 5.2  3.9 - 10.3 10e3/uL Final  . NEUT# 11/09/2016 2.7  1.5 - 6.5 10e3/uL Final  . HGB 11/09/2016 9.1* 11.6 - 15.9 g/dL Final  . HCT 11/09/2016 26.6* 34.8 - 46.6 % Final  . Platelets 11/09/2016 85* 145 - 400 10e3/uL Final  . MCV 11/09/2016 95.3  79.5 - 101.0 fL Final  . MCH 11/09/2016 32.6  25.1 - 34.0 pg Final  . MCHC 11/09/2016 34.2  31.5 - 36.0 g/dL Final  . RBC 11/09/2016 2.79* 3.70 - 5.45 10e6/uL Final  . RDW 11/09/2016 15.3* 11.2 - 14.5 % Final  . lymph# 11/09/2016 0.5* 0.9 - 3.3 10e3/uL Final  . MONO# 11/09/2016 2.0* 0.1 - 0.9 10e3/uL Final  . Eosinophils Absolute 11/09/2016 0.1  0.0 - 0.5 10e3/uL Final  . Basophils Absolute 11/09/2016 0.0  0.0 - 0.1 10e3/uL Final  . NEUT% 11/09/2016 50.9  38.4 - 76.8 % Final  . LYMPH% 11/09/2016 9.0* 14.0 - 49.7 % Final  . MONO% 11/09/2016 38.9* 0.0 - 14.0 % Final  . EOS% 11/09/2016 1.0  0.0 - 7.0 % Final  . BASO% 11/09/2016 0.2  0.0 - 2.0 % Final  . Sodium 11/09/2016 135* 136 - 145 mEq/L Final  . Potassium 11/09/2016 3.1* 3.5 - 5.1 mEq/L Final  . Chloride 11/09/2016 97* 98 - 109 mEq/L Final  . CO2 11/09/2016 28  22 - 29 mEq/L Final  . Glucose 11/09/2016 123  70 - 140 mg/dl Final  . BUN 11/09/2016 15.9  7.0 - 26.0 mg/dL Final  . Creatinine 11/09/2016 0.9  0.6 - 1.1 mg/dL Final  . Total Bilirubin 11/09/2016 1.41* 0.20 - 1.20 mg/dL Final  . Alkaline Phosphatase 11/09/2016 92  40 - 150 U/L Final  . AST 11/09/2016 16   5 - 34 U/L Final  . ALT 11/09/2016 14  0 - 55 U/L Final  . Total Protein 11/09/2016 5.9* 6.4 - 8.3 g/dL Final  . Albumin 11/09/2016 3.0* 3.5 - 5.0 g/dL Final  . Calcium 11/09/2016 9.0  8.4 - 10.4 mg/dL Final  . Anion Gap 11/09/2016 11  3 - 11 mEq/L Final  . EGFR 11/09/2016  67* >90 ml/min/1.73 m2 Final  Hospital Outpatient Visit on 11/07/2016  Component Date Value Ref Range Status  . Order Confirmation 11/07/2016 ORDER PROCESSED BY BLOOD BANK   Final  . ABO/RH(D) 11/07/2016 A POS   Final  . Antibody Screen 11/07/2016 NEG   Final  . Sample Expiration 11/07/2016 11/10/2016   Final  . Unit Number 11/07/2016 [id number]   Final  . Blood Component Type 11/07/2016 RED CELLS,LR   Final  . Unit division 11/07/2016 00   Final  . Status of Unit 11/07/2016 ISSUED,FINAL   Final  . Transfusion Status 11/07/2016 OK TO TRANSFUSE   Final  . Crossmatch Result 11/07/2016 Compatible   Final  . Unit Number 11/07/2016 [id number]   Final  . Blood Component Type 11/07/2016 RED CELLS,LR   Final  . Unit division 11/07/2016 00   Final  . Status of Unit 11/07/2016 ISSUED   Final  . Transfusion Status 11/07/2016 OK TO TRANSFUSE   Final  . Crossmatch Result 11/07/2016 Compatible   Final  . ISSUE DATE / TIME 11/07/2016 [id number]   Final  . Blood Product Unit Number 11/07/2016 [id number]   Final  . PRODUCT CODE 11/07/2016 [id number]   Final  . Unit Type and Rh 11/07/2016 6200   Final  . Blood Product Expiration Date 11/07/2016 [id number]   Final  . ISSUE DATE / TIME 11/07/2016 [id number]   Final  . Blood Product Unit Number 11/07/2016 [id number]   Final  . PRODUCT CODE 11/07/2016 [id number]   Final  . Unit Type and Rh 11/07/2016 6200   Final  . Blood Product Expiration Date 11/07/2016 [id number]   Final  Appointment on 11/07/2016  Component Date Value Ref Range Status  . Sodium 11/07/2016 135* 136 - 145 mEq/L Final  . Potassium 11/07/2016 3.6  3.5 - 5.1 mEq/L Final  . Chloride 11/07/2016 97*  98 - 109 mEq/L Final  . CO2 11/07/2016 28  22 - 29 mEq/L Final  . Glucose 11/07/2016 131  70 - 140 mg/dl Final  . BUN 11/07/2016 20.4  7.0 - 26.0 mg/dL Final  . Creatinine 11/07/2016 0.8  0.6 - 1.1 mg/dL Final  . Total Bilirubin 11/07/2016 1.62* 0.20 - 1.20 mg/dL Final  . Alkaline Phosphatase 11/07/2016 68  40 - 150 U/L Final  . AST 11/07/2016 11  5 - 34 U/L Final  . ALT 11/07/2016 11  0 - 55 U/L Final  . Total Protein 11/07/2016 5.7* 6.4 - 8.3 g/dL Final  . Albumin 11/07/2016 3.2* 3.5 - 5.0 g/dL Final  . Calcium 11/07/2016 9.3  8.4 - 10.4 mg/dL Final  . Anion Gap 11/07/2016 9  3 - 11 mEq/L Final  . EGFR 11/07/2016 74* >90 ml/min/1.73 m2 Final  . WBC 11/07/2016 0.9* 3.9 - 10.3 10e3/uL Final  . NEUT# 11/07/2016 0.1* 1.5 - 6.5 10e3/uL Final  . HGB 11/07/2016 7.4* 11.6 - 15.9 g/dL Final  . HCT 11/07/2016 21.8* 34.8 - 46.6 % Final  . Platelets 11/07/2016 46* 145 - 400 10e3/uL Final  . MCV 11/07/2016 96.5  79.5 - 101.0 fL Final  . MCH 11/07/2016 32.7  25.1 - 34.0 pg Final  . MCHC 11/07/2016 33.9  31.5 - 36.0 g/dL Final  . RBC 11/07/2016 2.26* 3.70 - 5.45 10e6/uL Final  . RDW 11/07/2016 15.0* 11.2 - 14.5 % Final  . lymph# 11/07/2016 0.3* 0.9 - 3.3 10e3/uL Final  . MONO# 11/07/2016 0.5  0.1 - 0.9 10e3/uL Final  . Eosinophils Absolute 11/07/2016 0.0  0.0 -  0.5 10e3/uL Final  . Basophils Absolute 11/07/2016 0.0  0.0 - 0.1 10e3/uL Final  . NEUT% 11/07/2016 11.9* 38.4 - 76.8 % Final  . LYMPH% 11/07/2016 28.3  14.0 - 49.7 % Final  . MONO% 11/07/2016 57.6* 0.0 - 14.0 % Final  . EOS% 11/07/2016 2.2  0.0 - 7.0 % Final  . BASO% 11/07/2016 0.0  0.0 - 2.0 % Final  . nRBC 11/07/2016 0  0 - 0 % Final    RADIOGRAPHIC STUDIES: No results found.  ASSESSMENT/PLAN:    Hypotension ; it has been taking losartan/HCTZ and metoprolol twice daily  As previously directed.  Patient states that she takes metoprolol for rate control.  Patient's blood pressure on Wednesday was 95/56 initially; and today's  initial blood pressure was even lower at 87/50.  She continues to feeltired and has minimal energy.  She denies any dizziness or near syncopal events.  Reviewed all findings with Dr. Benay Spice; he recommended the patient completely discontinue the losartan/HCTZ; and to cut the metoprolol dosage to half.  Therefore-patient will take metoprolol one half of a tabletin the morning and one half of a tablet in the evening.  Also, patient will check her blood pressure at home and keep a blood pressure log.  Hypokalemia  Patient's potassium was down to 3.1 today.  Patient states she continues to take potassium 20 mg once once daily as previously directed.  Patient was given an additional potassium 40 mEq orally while the cancer Center today.  Will continue to monitor.  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  She also received Neulasta for growth factor support following this last cycle of chemotherapy.    See further notes for details  Of today's visit.  Also, patient has been scheduled for labs, flush, and a visit with Dr. Benay Spice on 12/10/2016.  Dehydration   Patient returned to the [facility] today to receive another unit of blood; and also to receive some IV fluid rehydration.  See further notes regarding low blood pressure. Patient was encouraged to push fluids at home is much as possible.  We'll continue to monitor closely.  Antineoplastic chemotherapy induced pancytopenia (CODE) ([facility])  Patient's counts have greatly improved today.  Hemoglobin has improved to 9.1 platelet count has improved to 85, and ANC is improved to 2.7.  Patient was advised she could discontinue the Cipro antibiotics at this time.  Patient will proceed today with her second unit of blood as prescheduled.   Patient stated understanding of all instructions; and was in agreement with this plan of care. The patient knows to  call the clinic with any problems, questions or concerns.   Total time spent with patient was 15 minutes;  with greater than 75 percent of that time spent in face to face counseling regarding patient's symptoms,  and coordination of care and follow up.  Disclaimer:This dictation was prepared with Dragon/digital dictation along with [employer]. Any transcriptional errors that result from this process are unintentional.  Drue Second, NP 11/09/2016

## 2016-11-09 NOTE — Assessment & Plan Note (Signed)
Patient's counts have greatly improved today.  Hemoglobin has improved to 9.1 platelet count has improved to 85, and ANC is improved to 2.7.  Patient was advised she could discontinue the Cipro antibiotics at this time.  Patient will proceed today with her second unit of blood as prescheduled.

## 2016-11-09 NOTE — Assessment & Plan Note (Signed)
Patient returned to the [facility] today to receive another unit of blood; and also to receive some IV fluid rehydration.  See further notes regarding low blood pressure. Patient was encouraged to push fluids at home is much as possible.  We'll continue to monitor closely.

## 2016-11-09 NOTE — Progress Notes (Signed)
Started IV fluids in [facility] after port accessed. Given 40 meq KCL po for K+of 3.1  Transported to Infusion area via w/c with husband in attendance for blood transfusion. Report given to Terri, RN and Aldona Bar, Therapist, sports

## 2016-11-12 ENCOUNTER — Telehealth: Payer: Self-pay | Admitting: Nurse Practitioner

## 2016-11-12 ENCOUNTER — Telehealth: Payer: Self-pay

## 2016-11-12 LAB — BPAM RBC
BLOOD PRODUCT EXPIRATION DATE: 201804062359
Blood Product Expiration Date: 201804112359
ISSUE DATE / TIME: 201803141512
ISSUE DATE / TIME: 201803161519
Unit Type and Rh: 6200
Unit Type and Rh: 6200

## 2016-11-12 LAB — TYPE AND SCREEN
ABO/RH(D): A POS
Antibody Screen: NEGATIVE
UNIT DIVISION: 0
Unit division: 0

## 2016-11-12 NOTE — Telephone Encounter (Signed)
Pt called and reported that her BP check today showed 130/72.

## 2016-11-12 NOTE — Telephone Encounter (Signed)
Called patient to check in on her.  She states that she received her last unit of blood this past Friday; and has also been holding the losartan/HCTZ as previously directed.  She is only taking half of the metoprolol twice daily as directed as well.  She states that she is feeling much better and much stronger now.  She has no new complaints.  Patient was advised to call/return or go directly to the emergency department for any worsening symptoms whatsoever.

## 2016-11-26 ENCOUNTER — Other Ambulatory Visit: Payer: Self-pay | Admitting: Nurse Practitioner

## 2016-11-26 ENCOUNTER — Other Ambulatory Visit (HOSPITAL_BASED_OUTPATIENT_CLINIC_OR_DEPARTMENT_OTHER): Payer: Medicare Other

## 2016-11-26 ENCOUNTER — Ambulatory Visit: Payer: Medicare Other

## 2016-11-26 ENCOUNTER — Ambulatory Visit (HOSPITAL_BASED_OUTPATIENT_CLINIC_OR_DEPARTMENT_OTHER): Payer: Medicare Other | Admitting: Nurse Practitioner

## 2016-11-26 ENCOUNTER — Telehealth: Payer: Self-pay

## 2016-11-26 ENCOUNTER — Other Ambulatory Visit (HOSPITAL_COMMUNITY)
Admission: AD | Admit: 2016-11-26 | Discharge: 2016-11-26 | Disposition: A | Discharge status: Home / Self Care | Payer: Medicare Other | Source: Ambulatory Visit | Attending: Nurse Practitioner | Admitting: Nurse Practitioner

## 2016-11-26 ENCOUNTER — Telehealth: Payer: Self-pay | Admitting: *Deleted

## 2016-11-26 ENCOUNTER — Other Ambulatory Visit: Payer: Self-pay | Admitting: *Deleted

## 2016-11-26 ENCOUNTER — Ambulatory Visit (HOSPITAL_COMMUNITY)
Admission: RE | Admit: 2016-11-26 | Discharge: 2016-11-26 | Disposition: A | Discharge status: Home / Self Care | Payer: Medicare Other | Source: Ambulatory Visit | Attending: Nurse Practitioner | Admitting: Nurse Practitioner

## 2016-11-26 VITALS — BP 140/70 | HR 88 | Temp 98.9°F | Resp 18 | Wt 177.8 lb

## 2016-11-26 DIAGNOSIS — R300 Dysuria: Secondary | ICD-10-CM | POA: Insufficient documentation

## 2016-11-26 DIAGNOSIS — C8335 Diffuse large B-cell lymphoma, lymph nodes of inguinal region and lower limb: Secondary | ICD-10-CM

## 2016-11-26 DIAGNOSIS — R319 Hematuria, unspecified: Secondary | ICD-10-CM

## 2016-11-26 DIAGNOSIS — C8330 Diffuse large B-cell lymphoma, unspecified site: Secondary | ICD-10-CM

## 2016-11-26 DIAGNOSIS — Z95828 Presence of other vascular implants and grafts: Secondary | ICD-10-CM

## 2016-11-26 DIAGNOSIS — N390 Urinary tract infection, site not specified: Secondary | ICD-10-CM | POA: Diagnosis not present

## 2016-11-26 DIAGNOSIS — R609 Edema, unspecified: Secondary | ICD-10-CM | POA: Diagnosis not present

## 2016-11-26 LAB — COMPREHENSIVE METABOLIC PANEL
ALBUMIN: 3 g/dL — AB (ref 3.5–5.0)
ALK PHOS: 75 U/L (ref 40–150)
ALT: 10 U/L (ref 0–55)
AST: 15 U/L (ref 5–34)
Anion Gap: 6 mEq/L (ref 3–11)
BUN: 11.8 mg/dL (ref 7.0–26.0)
CO2: 26 meq/L (ref 22–29)
Calcium: 8.6 mg/dL (ref 8.4–10.4)
Chloride: 104 mEq/L (ref 98–109)
Creatinine: 0.7 mg/dL (ref 0.6–1.1)
EGFR: 84 mL/min/{1.73_m2} — AB (ref >90)
Glucose: 110 mg/dl (ref 70–140)
POTASSIUM: 3.8 meq/L (ref 3.5–5.1)
SODIUM: 137 meq/L (ref 136–145)
Total Bilirubin: 0.94 mg/dL (ref 0.20–1.20)
Total Protein: 5.5 g/dL — ABNORMAL LOW (ref 6.4–8.3)

## 2016-11-26 LAB — CBC WITH DIFFERENTIAL/PLATELET
BASO%: 0.5 % (ref 0.0–2.0)
BASOS ABS: 0 10*3/uL (ref 0.0–0.1)
EOS%: 0.1 % (ref 0.0–7.0)
Eosinophils Absolute: 0 10*3/uL (ref 0.0–0.5)
HCT: 27.3 % — ABNORMAL LOW (ref 34.8–46.6)
HEMOGLOBIN: 9.1 g/dL — AB (ref 11.6–15.9)
LYMPH%: 13.8 % — AB (ref 14.0–49.7)
MCH: 31.9 pg (ref 25.1–34.0)
MCHC: 33.3 g/dL (ref 31.5–36.0)
MCV: 95.8 fL (ref 79.5–101.0)
MONO#: 0.8 10*3/uL (ref 0.1–0.9)
MONO%: 10.1 % (ref 0.0–14.0)
NEUT%: 75.5 % (ref 38.4–76.8)
NEUTROS ABS: 5.8 10*3/uL (ref 1.5–6.5)
Platelets: 193 10*3/uL (ref 145–400)
RBC: 2.85 10*6/uL — AB (ref 3.70–5.45)
RDW: 17.1 % — AB (ref 11.2–14.5)
WBC: 7.7 10*3/uL (ref 3.9–10.3)
lymph#: 1.1 10*3/uL (ref 0.9–3.3)

## 2016-11-26 LAB — URINALYSIS, MICROSCOPIC - CHCC
Bilirubin (Urine): NEGATIVE
GLUCOSE UR CHCC: NEGATIVE mg/dL
Ketones: NEGATIVE mg/dL
Nitrite: POSITIVE
Protein: 100 mg/dL
SPECIFIC GRAVITY, URINE: 1.015 (ref 1.003–1.035)
UROBILINOGEN UR: 0.2 mg/dL (ref 0.2–1)
pH: 6.5 (ref 4.6–8.0)

## 2016-11-26 LAB — BRAIN NATRIURETIC PEPTIDE: B NATRIURETIC PEPTIDE 5: 288 pg/mL — AB (ref 0.0–100.0)

## 2016-11-26 LAB — MAGNESIUM: Magnesium: 1.7 mg/dl (ref 1.5–2.5)

## 2016-11-26 MED ORDER — SODIUM CHLORIDE 0.9% FLUSH
10.0000 mL | INTRAVENOUS | Status: DC | PRN
  Administered 2016-11-26: 10 mL via INTRAVENOUS
  Filled 2016-11-26: qty 10

## 2016-11-26 MED ORDER — SODIUM CHLORIDE 0.9% FLUSH
10.0000 mL | Freq: Once | INTRAVENOUS | Status: AC
  Administered 2016-11-26: 10 mL via INTRAVENOUS
  Filled 2016-11-26: qty 10

## 2016-11-26 MED ORDER — SULFAMETHOXAZOLE-TRIMETHOPRIM 800-160 MG PO TABS
1.0000 | ORAL_TABLET | Freq: Two times a day (BID) | ORAL | 0 refills | Status: DC
Start: 2016-11-26 — End: 2016-12-12

## 2016-11-26 MED ORDER — HEPARIN SOD (PORK) LOCK FLUSH 100 UNIT/ML IV SOLN
500.0000 [IU] | Freq: Once | INTRAVENOUS | Status: DC | PRN
  Administered 2016-11-26: 500 [IU] via INTRAVENOUS
  Filled 2016-11-26: qty 5

## 2016-11-26 NOTE — Progress Notes (Signed)
Pt taken to radiology for doppler study of right leg, via wheelchair per Jethro Bolus, LPN. Pt to return to [facility] after doppler study done.  Pt returned to [facility] from radiology @ approx 1500

## 2016-11-26 NOTE — Progress Notes (Signed)
*  Preliminary Results* Right lower extremity venous duplex completed. Right lower extremity is negative for deep vein thrombosis. There is no evidence of right Baker's cyst.  11/26/2016 3:03 PM  Maudry Mayhew, BS, RVT, RDCS, RDMS

## 2016-11-26 NOTE — Patient Instructions (Signed)
Implanted Port Home Guide An implanted port is a type of central line that is placed under the skin. Central lines are used to provide IV access when treatment or nutrition needs to be given through a person's veins. Implanted ports are used for long-term IV access. An implanted port may be placed because:  You need IV medicine that would be irritating to the small veins in your hands or arms.  You need long-term IV medicines, such as antibiotics.  You need IV nutrition for a long period.  You need frequent blood draws for lab tests.  You need dialysis.  Implanted ports are usually placed in the chest area, but they can also be placed in the upper arm, the abdomen, or the leg. An implanted port has two main parts:  Reservoir. The reservoir is round and will appear as a small, raised area under your skin. The reservoir is the part where a needle is inserted to give medicines or draw blood.  Catheter. The catheter is a thin, flexible tube that extends from the reservoir. The catheter is placed into a large vein. Medicine that is inserted into the reservoir goes into the catheter and then into the vein.  How will I care for my incision site? Do not get the incision site wet. Bathe or shower as directed by your health care provider. How is my port accessed? Special steps must be taken to access the port:  Before the port is accessed, a numbing cream can be placed on the skin. This helps numb the skin over the port site.  Your health care provider uses a sterile technique to access the port. ? Your health care provider must put on a mask and sterile gloves. ? The skin over your port is cleaned carefully with an antiseptic and allowed to dry. ? The port is gently pinched between sterile gloves, and a needle is inserted into the port.  Only "non-coring" port needles should be used to access the port. Once the port is accessed, a blood return should be checked. This helps ensure that the port  is in the vein and is not clogged.  If your port needs to remain accessed for a constant infusion, a clear (transparent) bandage will be placed over the needle site. The bandage and needle will need to be changed every week, or as directed by your health care provider.  Keep the bandage covering the needle clean and dry. Do not get it wet. Follow your health care provider's instructions on how to take a shower or bath while the port is accessed.  If your port does not need to stay accessed, no bandage is needed over the port.  What is flushing? Flushing helps keep the port from getting clogged. Follow your health care provider's instructions on how and when to flush the port. Ports are usually flushed with saline solution or a medicine called heparin. The need for flushing will depend on how the port is used.  If the port is used for intermittent medicines or blood draws, the port will need to be flushed: ? After medicines have been given. ? After blood has been drawn. ? As part of routine maintenance.  If a constant infusion is running, the port may not need to be flushed.  How long will my port stay implanted? The port can stay in for as long as your health care provider thinks it is needed. When it is time for the port to come out, surgery will be   done to remove it. The procedure is similar to the one performed when the port was put in. When should I seek immediate medical care? When you have an implanted port, you should seek immediate medical care if:  You notice a bad smell coming from the incision site.  You have swelling, redness, or drainage at the incision site.  You have more swelling or pain at the port site or the surrounding area.  You have a fever that is not controlled with medicine.  This information is not intended to replace advice given to you by your health care provider. Make sure you discuss any questions you have with your health care provider. Document  Released: 08/13/2005 Document Revised: 01/19/2016 Document Reviewed: 04/20/2013 Elsevier Interactive Patient Education  2017 [employer]  

## 2016-11-26 NOTE — Telephone Encounter (Signed)
Call placed back to patient after call placed to triage this AM regarding weakness.  Patient states that her husband is able to bring her in today at 10:30AM for labs and to see C. Berniece Salines NP.  Dr. Benay Spice aware and message sent to scheduling.

## 2016-11-26 NOTE — Telephone Encounter (Signed)
Pt called not feeling well and weak and wobbly on her feet for the last 2-3 days kind of like when her blood was low last month. Her food is also tasting bad and she is not eating well.  Her last chemo was 3/5. She is asking to come in this week b/c her next appt is 4/16.

## 2016-11-27 ENCOUNTER — Encounter: Payer: Self-pay | Admitting: Nurse Practitioner

## 2016-11-27 DIAGNOSIS — R609 Edema, unspecified: Secondary | ICD-10-CM | POA: Insufficient documentation

## 2016-11-27 NOTE — Assessment & Plan Note (Signed)
Patient states that she has had peripheral edema to bilateral lower extremity for the past month or so.  She also has a history of hypertension; and continues to take metoprolol one half of a tablet twice daily.  She used to take Hyzaar/HCTZ as well; this was discontinued due to low blood pressure.  Patient denies any issues with shortness of breath or pain with inspiration.  She also denies any chest pain.  On exam.  Patient does have some +2 bilateral edema to lower extremities; with the right slightly larger than the left.  There is no calf tenderness on exam.  Lungs were clear bilaterally with no cough or wheeze.  This also, no shortness breath on exam.  Doppler ultrasound obtained today was negative for DVT.  BNP obtained today was only slightly elevated at 288.  Patient was encouraged to use compression stockings to help with the edema.  She may also elevate her legs above the level of for heart.  Whenever she is at rest.  She was advised to monitor the leg edema.  Carefully; and let us know if her symptoms persist or worsen.  Whatsoever.

## 2016-11-27 NOTE — Progress Notes (Signed)
SYMPTOM MANAGEMENT CLINIC    Chief Complaint: UTI  HPI:  Kelly Ray 76 y.o. female diagnosed with lymphoma.  Patient is status post chemotherapy that was completed on 10/29/2016.     Cancer ([place]) (Resolved)    Initial Diagnosis    Cancer ([place])       Review of Systems  Constitutional: Positive for malaise/fatigue.  Cardiovascular: Positive for leg swelling.  Genitourinary: Positive for frequency.  All other systems reviewed and are negative.   Past Medical History:  Diagnosis Date  . Arthritis    Osteoarthritis  . B-cell lymphoma ([place]) 05/04/11   Large B-cell lymphoma  . Cancer ([place])    NHL  . Hx of radiation therapy 08/17/13- 09/03/13   right lower extremity- lymphoma  . Hypertension   . S/P knee replacement    Left knee  . Thyroid disease    Hyperthyroidism    Past Surgical History:  Procedure Laterality Date  . IR GENERIC HISTORICAL  07/13/2016   IR US GUIDE VASC ACCESS RIGHT 07/13/2016 [facility]  . IR GENERIC HISTORICAL  07/13/2016   IR FLUORO GUIDE PORT INSERTION RIGHT 07/13/2016 [facility]  . NECK LESION BIOPSY Right 05/04/11   node biopsies  . 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    is allergic to lisinopril.  Allergies as of 11/26/2016      Reactions   Lisinopril Cough      Medication List       Accurate as of 11/26/16 11:59 PM. Always use your most recent med list.          etoposide 50 MG capsule Commonly known as:  VEPESID Take 4 capsules (200 mg total) by mouth daily. Takes on days 2 and 3 of each 21 day cycle for 2 cycles   levothyroxine 112 MCG tablet Commonly known as:  SYNTHROID, LEVOTHROID Take 1 tablet daily     lidocaine-prilocaine cream Commonly known as:  EMLA Apply 1 application topically as needed.   losartan-hydrochlorothiazide 50-12.5 MG tablet Commonly known as:  HYZAAR Take 1/2 tab PO QD in AM.   metoprolol tartrate 25 MG tablet Commonly known as:  LOPRESSOR Take 1 tablet (25 mg total) by mouth 2 (two) times daily.   naproxen sodium 220 MG tablet Commonly known as:  ANAPROX Take 220 mg by mouth 2 (two) times daily with a meal.   potassium chloride SA 20 MEQ tablet Commonly known as:  K-DUR,KLOR-CON Take 1 tablet (20 mEq total) by mouth daily.   pravastatin 40 MG tablet Commonly known as:  PRAVACHOL Take 1 tablet (40 mg total) by mouth at bedtime.   predniSONE 20 MG tablet Commonly known as:  DELTASONE Take 4 tablets (80 mg total) by mouth daily with breakfast.  Take for four days beginning day 2, day after chemotherapy.   prochlorperazine 10 MG tablet Commonly known as:  COMPAZINE Take 1 tablet (10 mg total) by mouth every 6 (six) hours as needed for nausea or vomiting.   sucralfate 1 GM/10ML suspension Commonly known as:  CARAFATE Take 10 mLs (1 g total) by mouth 4 (four) times daily -  with meals and at bedtime.   sulfamethoxazole-trimethoprim 800-160 MG tablet Commonly known as:  BACTRIM DS,SEPTRA DS Take  1 tablet by mouth 2 (two) times daily.        PHYSICAL EXAMINATION  Oncology Vitals 11/26/2016 11/09/2016  Height - -  Weight 80.65 kg -  Weight (lbs) 177 lbs 13 oz -  BMI (kg/m2) 33.6 kg/m2 -  Temp 98.9 98  Pulse 88 81  Resp 18 19  Resp (Historical as of 03/27/12) - -  SpO2 99 100  BSA (m2) 1.86 m2 -   BP Readings from Last 2 Encounters:  11/26/16 140/70  11/09/16 (!) 102/54    Physical Exam  Constitutional: She is oriented to person, place, and time. Vital signs are normal. She appears malnourished. She appears unhealthy. She appears cachectic.  HENT:  Head: Normocephalic and atraumatic.  Mouth/Throat: Oropharynx is clear and moist.  Eyes:  Conjunctivae and EOM are normal. Pupils are equal, round, and reactive to light. Right eye exhibits no discharge. Left eye exhibits no discharge. No scleral icterus.  Neck: Normal range of motion. Neck supple. No JVD present. No tracheal deviation present. No thyromegaly present.  Cardiovascular: Normal rate, regular rhythm, normal heart sounds and intact distal pulses.   Pulmonary/Chest: Effort normal and breath sounds normal. No respiratory distress. She has no wheezes. She has no rales. She exhibits no tenderness.  Abdominal: Soft. Bowel sounds are normal. She exhibits no distension and no mass. There is no tenderness. There is no rebound and no guarding.  Musculoskeletal: Normal range of motion. She exhibits edema. She exhibits no tenderness.  +2 at edema to bilateral lower extremities with right greater than left.  Lymphadenopathy:    She has no cervical adenopathy.  Neurological: She is alert and oriented to person, place, and time. Gait normal.  Skin: Skin is warm and dry. No rash noted. No erythema. There is pallor.  Psychiatric: Affect normal.  Nursing note and vitals reviewed.   LABORATORY DATA:. Hospital Outpatient Visit on 11/26/2016  Component Date Value Ref Range Status  . B Natriuretic Peptide 11/26/2016 288.0* 0.0 - 100.0 pg/mL Final  Appointment on 11/26/2016  Component Date Value Ref Range Status  . WBC 11/26/2016 7.7  3.9 - 10.3 10e3/uL Final  . NEUT# 11/26/2016 5.8  1.5 - 6.5 10e3/uL Final  . HGB 11/26/2016 9.1* 11.6 - 15.9 g/dL Final  . HCT 11/26/2016 27.3* 34.8 - 46.6 % Final  . Platelets 11/26/2016 193  145 - 400 10e3/uL Final  . MCV 11/26/2016 95.8  79.5 - 101.0 fL Final  . MCH 11/26/2016 31.9  25.1 - 34.0 pg Final  . MCHC 11/26/2016 33.3  31.5 - 36.0 g/dL Final  . RBC 11/26/2016 2.85* 3.70 - 5.45 10e6/uL Final  . RDW 11/26/2016 17.1* 11.2 - 14.5 % Final  . lymph# 11/26/2016 1.1  0.9 - 3.3 10e3/uL Final  . MONO# 11/26/2016 0.8  0.1 - 0.9 10e3/uL Final  .  Eosinophils Absolute 11/26/2016 0.0  0.0 - 0.5 10e3/uL Final  . Basophils Absolute 11/26/2016 0.0  0.0 - 0.1 10e3/uL Final  . NEUT% 11/26/2016 75.5  38.4 - 76.8 % Final  . LYMPH% 11/26/2016 13.8* 14.0 - 49.7 % Final  . MONO% 11/26/2016 10.1  0.0 - 14.0 % Final  . EOS% 11/26/2016 0.1  0.0 - 7.0 % Final  . BASO% 11/26/2016 0.5  0.0 - 2.0 % Final  . Sodium 11/26/2016 137  136 - 145 mEq/L Final  . Potassium 11/26/2016 3.8  3.5 - 5.1 mEq/L Final  . Chloride 11/26/2016 104  98 - 109 mEq/L Final  . CO2 11/26/2016 26  22 - 29 mEq/L Final  . Glucose 11/26/2016 110  70 - 140 mg/dl Final  . BUN 11/26/2016 11.8  7.0 - 26.0 mg/dL Final  . Creatinine 11/26/2016 0.7  0.6 - 1.1 mg/dL Final  . Total Bilirubin 11/26/2016 0.94  0.20 - 1.20 mg/dL Final  . Alkaline Phosphatase 11/26/2016 75  40 - 150 U/L Final  . AST 11/26/2016 15  5 - 34 U/L Final  . ALT 11/26/2016 10  0 - 55 U/L Final  . Total Protein 11/26/2016 5.5* 6.4 - 8.3 g/dL Final  . Albumin 11/26/2016 3.0* 3.5 - 5.0 g/dL Final  . Calcium 11/26/2016 8.6  8.4 - 10.4 mg/dL Final  . Anion Gap 11/26/2016 6  3 - 11 mEq/L Final  . EGFR 11/26/2016 84* >90 ml/min/1.73 m2 Final  . Magnesium 11/26/2016 1.7  1.5 - 2.5 mg/dl Final  . Glucose 11/26/2016 Negative  Negative mg/dL Final  . Bilirubin (Urine) 11/26/2016 Negative  Negative Final  . Ketones 11/26/2016 Negative  Negative mg/dL Final  . Specific Gravity, Urine 11/26/2016 1.015  1.003 - 1.035 Final  . Blood 11/26/2016 Moderate  Negative Final  . pH 11/26/2016 6.5  4.6 - 8.0 Final  . Protein 11/26/2016 100  Negative- <30 mg/dL Final  . Urobilinogen, UR 11/26/2016 0.2  0.2 - 1 mg/dL Final  . Nitrite 11/26/2016 Positive  Negative Final  . Leukocyte Esterase 11/26/2016 Large  Negative Final  . RBC / HPF 11/26/2016 Field Obscured by WBCs  0 - 2 Final  . WBC, UA 11/26/2016 TNTC  0-2;Negative Final  . Bacteria, UA 11/26/2016 Many  Negative- Trace Final    RADIOGRAPHIC STUDIES: No results  found.  ASSESSMENT/PLAN:    UTI (urinary tract infection) Patient presented to the [facility] today complaining of feeling increased fatigue and some generalized achiness.  She denies any recent fevers or chills.  She denies any dysuria; but does note some urinary frequency.  Exam today reveals abdomen soft and nontender with thousand's.  Positive in all 4 quads.  No flank pain with palpation.  Urinalysis obtained today revealed positive UTI.  Urine culture results are pending.  Of note-patient's last urine culture in December 2017 revealed Proteus mirabilis which was sensitive to Bactrim.  Patient appears nontoxic today.  Will prescribe Bactrim antibiotics for treatment of UTI.  Awaiting culture results.  Patient was asked to call/return or go directly to the emergency department for any worsening symptoms whatsoever.  Peripheral edema Patient states that she has had peripheral edema to bilateral lower extremity for the past month or so.  She also has a history of hypertension; and continues to take metoprolol one half of a tablet twice daily.  She used to take Hyzaar/HCTZ as well; this was discontinued due to low blood pressure.  Patient denies any issues with shortness of breath or pain with inspiration.  She also denies any chest pain.  On exam.  Patient does have some +2 bilateral edema to lower extremities; with the right slightly larger than the left.  There is no calf tenderness on exam.  Lungs were clear bilaterally with no cough or wheeze.  This also, no shortness breath on exam.  Doppler ultrasound obtained today was negative for DVT.  BNP obtained today was only slightly elevated at 288.  Patient was encouraged to use compression stockings to help with the edema.  She may also elevate her legs above the level of for heart.  Whenever she is at rest.  She was advised to  monitor the leg edema.  Carefully; and let us know if her symptoms persist or worsen.  Whatsoever.  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  She is scheduled to return on 12/10/2016 for labs, flush, and a follow-up visit.  She knows to call in the interim with any new worries or concerns whatsoever.   Patient stated understanding of all instructions; and was in agreement with this plan of care. The patient knows to call the clinic with any problems, questions or concerns.   Total time spent with patient was 40 minutes;  with greater than 75 percent of that time spent in face to face counseling regarding patient's symptoms,  and coordination of care and follow up.  Disclaimer:This dictation was prepared with Dragon/digital dictation along with [employer]. Any transcriptional errors that result from this process are unintentional.  Drue Second, NP 11/27/2016

## 2016-11-27 NOTE — Assessment & Plan Note (Signed)
Patient presented to the [facility] today complaining of feeling increased fatigue and some generalized achiness.  She denies any recent fevers or chills.  She denies any dysuria; but does note some urinary frequency.  Exam today reveals abdomen soft and nontender with thousand's.  Positive in all 4 quads.  No flank pain with palpation.  Urinalysis obtained today revealed positive UTI.  Urine culture results are pending.  Of note-patient's last urine culture in December 2017 revealed Proteus mirabilis which was sensitive to Bactrim.  Patient appears nontoxic today.  Will prescribe Bactrim antibiotics for treatment of UTI.  Awaiting culture results.  Patient was asked to call/return or go directly to the emergency department for any worsening symptoms whatsoever.

## 2016-11-27 NOTE — Assessment & Plan Note (Signed)
Patient received her final cycle 6 R-CHOP chemotherapy regimen on 10/29/2016.  She is scheduled to return on 12/10/2016 for labs, flush, and a follow-up visit.  She knows to call in the interim with any new worries or concerns whatsoever.

## 2016-11-28 ENCOUNTER — Telehealth: Payer: Self-pay | Admitting: *Deleted

## 2016-11-28 LAB — URINE CULTURE

## 2016-11-28 NOTE — Telephone Encounter (Signed)
TCT patient to follow up on [facility] visit on 11/26/16. Pt dx'd with UTI and on antibiotics. No answer to indentified home #. VM message left for pt to call back with any questions or concerns.

## 2016-12-10 ENCOUNTER — Ambulatory Visit: Payer: Medicare Other | Admitting: Nurse Practitioner

## 2016-12-10 ENCOUNTER — Other Ambulatory Visit: Payer: Medicare Other

## 2016-12-11 ENCOUNTER — Telehealth: Payer: Self-pay | Admitting: Nurse Practitioner

## 2016-12-11 NOTE — Telephone Encounter (Signed)
Patient came to scheduling per missing her 4.16.18 appointments. She thought that they were scheduled for 4.17.18 instead. Rescheduled appointments to 4.18.18....   [place] per Chief Strategy Officer.

## 2016-12-12 ENCOUNTER — Ambulatory Visit (HOSPITAL_BASED_OUTPATIENT_CLINIC_OR_DEPARTMENT_OTHER): Payer: Medicare Other | Admitting: Nurse Practitioner

## 2016-12-12 ENCOUNTER — Ambulatory Visit (HOSPITAL_BASED_OUTPATIENT_CLINIC_OR_DEPARTMENT_OTHER): Payer: Medicare Other

## 2016-12-12 ENCOUNTER — Other Ambulatory Visit: Payer: Medicare Other

## 2016-12-12 ENCOUNTER — Telehealth: Payer: Self-pay | Admitting: Oncology

## 2016-12-12 VITALS — BP 140/65 | HR 77 | Temp 98.3°F | Resp 18 | Ht 61.0 in | Wt 171.6 lb

## 2016-12-12 DIAGNOSIS — I10 Essential (primary) hypertension: Secondary | ICD-10-CM

## 2016-12-12 DIAGNOSIS — R269 Unspecified abnormalities of gait and mobility: Secondary | ICD-10-CM | POA: Diagnosis not present

## 2016-12-12 DIAGNOSIS — M79604 Pain in right leg: Secondary | ICD-10-CM | POA: Diagnosis not present

## 2016-12-12 DIAGNOSIS — E039 Hypothyroidism, unspecified: Secondary | ICD-10-CM | POA: Diagnosis not present

## 2016-12-12 DIAGNOSIS — Z95828 Presence of other vascular implants and grafts: Secondary | ICD-10-CM

## 2016-12-12 DIAGNOSIS — C8335 Diffuse large B-cell lymphoma, lymph nodes of inguinal region and lower limb: Secondary | ICD-10-CM

## 2016-12-12 DIAGNOSIS — M1990 Unspecified osteoarthritis, unspecified site: Secondary | ICD-10-CM

## 2016-12-12 DIAGNOSIS — E785 Hyperlipidemia, unspecified: Secondary | ICD-10-CM

## 2016-12-12 DIAGNOSIS — D6481 Anemia due to antineoplastic chemotherapy: Secondary | ICD-10-CM | POA: Diagnosis not present

## 2016-12-12 DIAGNOSIS — M79605 Pain in left leg: Secondary | ICD-10-CM | POA: Diagnosis not present

## 2016-12-12 LAB — COMPREHENSIVE METABOLIC PANEL
ALT: 10 U/L (ref 0–55)
AST: 22 U/L (ref 5–34)
Albumin: 3.5 g/dL (ref 3.5–5.0)
Alkaline Phosphatase: 92 U/L (ref 40–150)
Anion Gap: 9 mEq/L (ref 3–11)
BUN: 15.4 mg/dL (ref 7.0–26.0)
CALCIUM: 9.2 mg/dL (ref 8.4–10.4)
CHLORIDE: 104 meq/L (ref 98–109)
CO2: 28 mEq/L (ref 22–29)
CREATININE: 0.7 mg/dL (ref 0.6–1.1)
EGFR: 85 mL/min/{1.73_m2} — AB (ref >90)
Glucose: 98 mg/dl (ref 70–140)
POTASSIUM: 4.3 meq/L (ref 3.5–5.1)
Sodium: 141 mEq/L (ref 136–145)
Total Bilirubin: 0.59 mg/dL (ref 0.20–1.20)
Total Protein: 6.3 g/dL — ABNORMAL LOW (ref 6.4–8.3)

## 2016-12-12 LAB — CBC WITH DIFFERENTIAL/PLATELET
BASO%: 0 % (ref 0.0–2.0)
Basophils Absolute: 0 10*3/uL (ref 0.0–0.1)
EOS%: 0.4 % (ref 0.0–7.0)
Eosinophils Absolute: 0 10*3/uL (ref 0.0–0.5)
HEMATOCRIT: 27.6 % — AB (ref 34.8–46.6)
HGB: 8.9 g/dL — ABNORMAL LOW (ref 11.6–15.9)
LYMPH#: 1.9 10*3/uL (ref 0.9–3.3)
LYMPH%: 39.8 % (ref 14.0–49.7)
MCH: 31.1 pg (ref 25.1–34.0)
MCHC: 32.2 g/dL (ref 31.5–36.0)
MCV: 96.5 fL (ref 79.5–101.0)
MONO#: 0.2 10*3/uL (ref 0.1–0.9)
MONO%: 4.6 % (ref 0.0–14.0)
NEUT%: 55.2 % (ref 38.4–76.8)
NEUTROS ABS: 2.6 10*3/uL (ref 1.5–6.5)
PLATELETS: 140 10*3/uL — AB (ref 145–400)
RBC: 2.86 10*6/uL — ABNORMAL LOW (ref 3.70–5.45)
RDW: 16.4 % — AB (ref 11.2–14.5)
WBC: 4.8 10*3/uL (ref 3.9–10.3)

## 2016-12-12 MED ORDER — SODIUM CHLORIDE 0.9% FLUSH
10.0000 mL | INTRAVENOUS | Status: DC | PRN
  Administered 2016-12-12: 10 mL via INTRAVENOUS
  Filled 2016-12-12: qty 10

## 2016-12-12 MED ORDER — HEPARIN SOD (PORK) LOCK FLUSH 100 UNIT/ML IV SOLN
500.0000 [IU] | Freq: Once | INTRAVENOUS | Status: AC | PRN
  Administered 2016-12-12: 500 [IU] via INTRAVENOUS
  Filled 2016-12-12: qty 5

## 2016-12-12 NOTE — Progress Notes (Addendum)
[name]   Diagnosis:  Non-Hodgkin's lymphoma  INTERVAL HISTORY:   [name] returns as scheduled. She completed cycle 6 CHOP (Adriamycin deleted, oral etoposide substituted)/Rituxan 10/29/2016. She remains fatigued. Appetite is poor. She notes an alteration in taste. She continues to lose weight. About 4 weeks ago she began experiencing "shooting" bilateral leg pain below the knees. The pain occurs with ambulation and sometimes improves the further she walks and sometimes worsens with continued walking. The pain resolves with rest. The pain is located anteriorly below the knees. The pain has affected her balance and she has had multiple near falls. Legs feel weak. Stable chronic back pain. No new areas of pain. No numbness or tingling in her hands or feet. No unusual headaches. No visual disturbance. She denies any skin nodules. No fevers or sweats.  Objective:  Vital signs in last 24 hours:  Blood pressure 140/65, pulse 77, temperature 98.3 F (36.8 C), temperature source Oral, resp. rate 18, height '5\' 1"'$  (1.549 m), weight 171 lb 9.6 oz (77.8 kg), SpO2 98 %.    HEENT: No thrush or ulcers. Lymphatics: No palpable cervical, supra clavicular, axillary or inguinal lymph nodes. Resp: Lungs clear bilaterally. Cardio: Regular rate and rhythm. GI: Abdomen soft and nontender. No hepatomegaly. Vascular: Trace bilateral lower leg edema. Neuro: Alert and oriented. Follows commands. Question mild left leg weakness. Generalized hyporeflexia. Gait is ataxic. She had some difficulty with finger-to-nose. Skin: Right lower leg with no skin nodularity. Hyperpigmented area with small scab right lower medial leg.    Lab Results:  Lab Results  Component Value Date   WBC 4.8 12/12/2016   HGB 8.9 (L) 12/12/2016   HCT 27.6 (L) 12/12/2016   MCV 96.5 12/12/2016   PLT 140 (L) 12/12/2016   NEUTROABS 2.6 12/12/2016    Imaging:  No results found.  Medications: I  have reviewed the patient's current medications.  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 Negative restaging CT evaluation 10/24/2012  Nodular skin lesions at the right lower leg, status post a shave biopsy 07/15/2013 confirming a malignant B-cell lymphoma, diffuse large cell positive for CD20, BCL 6, BCL 2, and CD10   Staging bone marrow biopsy 07/31/2013, negative for involvement with lymphoma   Staging PET scan 07/30/2013-negative.   Status post palliative radiation right lower leg nodular skin lesions 08/17/2013 through 09/03/2013.  New nodular skin lesions at the right lower leg April 2017, status postpsies of lesions at the right lower leg 12/07/2015 confirming large B-cell lymphoma, CD20 positive bio  staging PET scan 12/26/2015-hypermetabolic cutaneous and subcutaneous nodules in the right lower extremity, no other evidence of lymphoma  Cycle 1 bendamustine/Rituxan 01/03/2016  Cycle 2 bendamustine/Rituxan 01/31/2016  Clinical progression with enlargement of multiple right lower leg skin lesions 02/23/2016  Status post radiation right lower leg 03/14/2016 through 03/29/2016  Clinical progression with new and enlarging skin lesions right lower leg 07/06/2016  PET scan 07/12/2016-clear interval increase in size and metabolic activity of a large nodule posterior right calf and a smaller subcutaneous nodule medial right thigh; several more superficial right lower extremity lesions improved with reduction in size and metabolic activity; single focus of metabolic activity associated with the medial right clavicle with bonychange  Cycle 1 CHOP/Rituxan 07/17/2016  Cycle 2 CHOP/Rituxan 08/07/2016-Neulasta added  Cycle 3 CHOP/Rituxan 08/28/2016 with Neulasta support  Restaging PET scan 09/14/2016-hypermetabolic subcutaneous nodules previously demonstrated  within the  right thigh and right calf have nearly resolved. There is some residual activity within the calf which could be postsurgical. Small focus of activity within the left latissimus dorsi muscle within the posterior chest wall without corresponding CT abnormality.  Cycle 4 CHOP/Rituxan 09/18/2016 (Adriamycin deleted, etoposide substituted daily 3)  Cycle 5 CHOP/rituximab with etoposide 10/08/2016  Cycle 6 CHOP/rituximab with etoposide 10/29/2016 2. Port-A-Cath placement 06/01/2011. Port-A-Cath removal 11/04/2012 3. Hypertension. 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 5. Hypercholesterolemia. 6. Osteoarthritis. 7. Malaise-? Secondary to progression of non-Hodgkin's lymphoma. Improved. 8. Staph infection right lower leg February 2015. 9. Right calf lesion with purulent drainage 02/09/2016, culture obtained-group B strep, doxycycline prescribed 10. Port-A-Cath placement 07/13/2016 11. 2-D echo 07/10/2016-estimated ejection fraction range of 55-60% 12. 07/17/2016 hepatitis Bsurface antigen negative, core antibody positive 13. Neutropenia secondary to chemotherapy 07/27/2016 14. Thrombocytopenia secondary to chemotherapy 07/27/2016 15. Anemia secondary to chemotherapy    Disposition: Kelly Ray completed cycle 6 CHOP/Rituxan with etoposide beginning 10/29/2016. She is now approximately 6 weeks out from completing treatment. She is experiencing bilateral lower leg pain and gait seems ataxic. Etiology of symptoms is unclear. We are referring her for a brain CT. She will return to the lab for a CBC, LDH, B-12 and thyroid panel. She will return for a follow-up visit 12/19/2016, sooner if needed.  Patient seen with Dr. Benay Spice. 25 minutes were spent face-to-face at today's visit with the majority of that time involved in counseling/carbonation of care.    Ned Card ANP/GNP-BC   12/12/2016  2:57 PM This was a shared visit with Ned Card. [name] was interviewed and examined. The  etiology of her symptom complex is unclear.  We will make a neurology referral if the above evaluation is nondiagnistic. This would be an unusual presentation of progressive lymphoma or toxicity from chemotherapy.  Julieanne Manson, MD

## 2016-12-12 NOTE — Telephone Encounter (Signed)
Labs added for today. Follow up scheduled for 04/25 with Lisa,per 12/12/16 los. Patient was given a copy of the AVS report and appointment schedule per 12/12/16 los.

## 2016-12-13 ENCOUNTER — Ambulatory Visit (HOSPITAL_COMMUNITY)
Admission: RE | Admit: 2016-12-13 | Discharge: 2016-12-13 | Disposition: A | Discharge status: Home / Self Care | Payer: Medicare Other | Source: Ambulatory Visit | Attending: Nurse Practitioner | Admitting: Nurse Practitioner

## 2016-12-13 DIAGNOSIS — C8335 Diffuse large B-cell lymphoma, lymph nodes of inguinal region and lower limb: Secondary | ICD-10-CM | POA: Insufficient documentation

## 2016-12-13 LAB — T3 UPTAKE
Free Thyroxine Index: 3.6 (ref 1.2–4.9)
T3 Uptake Ratio: 29 % (ref 24–39)

## 2016-12-13 LAB — T4: T4 TOTAL: 12.3 ug/dL — AB (ref 4.5–12.0)

## 2016-12-13 LAB — VITAMIN B12

## 2016-12-13 LAB — T4, FREE: FREE T4: 1.88 ng/dL — AB (ref 0.82–1.77)

## 2016-12-13 LAB — TSH: TSH: 0.282 m(IU)/L — ABNORMAL LOW (ref 0.308–3.960)

## 2016-12-13 LAB — LACTATE DEHYDROGENASE: LDH: 271 U/L — ABNORMAL HIGH (ref 125–245)

## 2016-12-13 MED ORDER — IOPAMIDOL (ISOVUE-300) INJECTION 61%
75.0000 mL | Freq: Once | INTRAVENOUS | Status: AC | PRN
  Administered 2016-12-13: 75 mL via INTRAVENOUS

## 2016-12-19 ENCOUNTER — Inpatient Hospital Stay: Admit: 2016-12-19 | Payer: Self-pay | Admitting: Internal Medicine

## 2016-12-19 ENCOUNTER — Inpatient Hospital Stay (HOSPITAL_COMMUNITY): Payer: Medicare Other

## 2016-12-19 ENCOUNTER — Inpatient Hospital Stay (HOSPITAL_COMMUNITY)
Admission: AD | Admit: 2016-12-19 | Discharge: 2016-12-23 | DRG: 808 | Disposition: A | Discharge status: Home / Self Care | Payer: Medicare Other | Source: Ambulatory Visit | Attending: Nephrology | Admitting: Nephrology

## 2016-12-19 ENCOUNTER — Encounter: Payer: Self-pay | Admitting: *Deleted

## 2016-12-19 ENCOUNTER — Ambulatory Visit (HOSPITAL_BASED_OUTPATIENT_CLINIC_OR_DEPARTMENT_OTHER): Payer: Medicare Other | Admitting: Nurse Practitioner

## 2016-12-19 ENCOUNTER — Encounter (HOSPITAL_COMMUNITY): Payer: Self-pay | Admitting: General Practice

## 2016-12-19 VITALS — BP 90/54 | HR 116 | Temp 97.5°F | Resp 18

## 2016-12-19 DIAGNOSIS — I959 Hypotension, unspecified: Secondary | ICD-10-CM | POA: Diagnosis present

## 2016-12-19 DIAGNOSIS — E876 Hypokalemia: Secondary | ICD-10-CM | POA: Diagnosis present

## 2016-12-19 DIAGNOSIS — R7881 Bacteremia: Secondary | ICD-10-CM | POA: Diagnosis present

## 2016-12-19 DIAGNOSIS — D6481 Anemia due to antineoplastic chemotherapy: Secondary | ICD-10-CM

## 2016-12-19 DIAGNOSIS — C8330 Diffuse large B-cell lymphoma, unspecified site: Secondary | ICD-10-CM | POA: Diagnosis present

## 2016-12-19 DIAGNOSIS — Z6829 Body mass index (BMI) 29.0-29.9, adult: Secondary | ICD-10-CM

## 2016-12-19 DIAGNOSIS — E7800 Pure hypercholesterolemia, unspecified: Secondary | ICD-10-CM | POA: Diagnosis present

## 2016-12-19 DIAGNOSIS — R209 Unspecified disturbances of skin sensation: Secondary | ICD-10-CM | POA: Diagnosis not present

## 2016-12-19 DIAGNOSIS — D649 Anemia, unspecified: Secondary | ICD-10-CM | POA: Diagnosis not present

## 2016-12-19 DIAGNOSIS — R270 Ataxia, unspecified: Secondary | ICD-10-CM | POA: Diagnosis present

## 2016-12-19 DIAGNOSIS — G92 Toxic encephalopathy: Secondary | ICD-10-CM | POA: Diagnosis present

## 2016-12-19 DIAGNOSIS — G6289 Other specified polyneuropathies: Secondary | ICD-10-CM | POA: Diagnosis not present

## 2016-12-19 DIAGNOSIS — Z96652 Presence of left artificial knee joint: Secondary | ICD-10-CM | POA: Diagnosis present

## 2016-12-19 DIAGNOSIS — I950 Idiopathic hypotension: Secondary | ICD-10-CM

## 2016-12-19 DIAGNOSIS — G620 Drug-induced polyneuropathy: Secondary | ICD-10-CM | POA: Diagnosis present

## 2016-12-19 DIAGNOSIS — R634 Abnormal weight loss: Secondary | ICD-10-CM | POA: Diagnosis present

## 2016-12-19 DIAGNOSIS — N390 Urinary tract infection, site not specified: Secondary | ICD-10-CM | POA: Diagnosis present

## 2016-12-19 DIAGNOSIS — E039 Hypothyroidism, unspecified: Secondary | ICD-10-CM | POA: Diagnosis present

## 2016-12-19 DIAGNOSIS — E860 Dehydration: Secondary | ICD-10-CM | POA: Diagnosis present

## 2016-12-19 DIAGNOSIS — Z79899 Other long term (current) drug therapy: Secondary | ICD-10-CM

## 2016-12-19 DIAGNOSIS — E441 Mild protein-calorie malnutrition: Secondary | ICD-10-CM | POA: Diagnosis present

## 2016-12-19 DIAGNOSIS — N179 Acute kidney failure, unspecified: Secondary | ICD-10-CM | POA: Diagnosis not present

## 2016-12-19 DIAGNOSIS — R600 Localized edema: Secondary | ICD-10-CM | POA: Diagnosis present

## 2016-12-19 DIAGNOSIS — R627 Adult failure to thrive: Secondary | ICD-10-CM

## 2016-12-19 DIAGNOSIS — B9629 Other Escherichia coli [E. coli] as the cause of diseases classified elsewhere: Secondary | ICD-10-CM

## 2016-12-19 DIAGNOSIS — N19 Unspecified kidney failure: Secondary | ICD-10-CM | POA: Diagnosis not present

## 2016-12-19 DIAGNOSIS — C8335 Diffuse large B-cell lymphoma, lymph nodes of inguinal region and lower limb: Secondary | ICD-10-CM | POA: Diagnosis present

## 2016-12-19 DIAGNOSIS — M1990 Unspecified osteoarthritis, unspecified site: Secondary | ICD-10-CM | POA: Diagnosis present

## 2016-12-19 DIAGNOSIS — E785 Hyperlipidemia, unspecified: Secondary | ICD-10-CM | POA: Diagnosis present

## 2016-12-19 DIAGNOSIS — T451X5A Adverse effect of antineoplastic and immunosuppressive drugs, initial encounter: Secondary | ICD-10-CM | POA: Diagnosis present

## 2016-12-19 DIAGNOSIS — Z888 Allergy status to other drugs, medicaments and biological substances status: Secondary | ICD-10-CM

## 2016-12-19 DIAGNOSIS — Z1612 Extended spectrum beta lactamase (ESBL) resistance: Secondary | ICD-10-CM | POA: Diagnosis not present

## 2016-12-19 DIAGNOSIS — D696 Thrombocytopenia, unspecified: Secondary | ICD-10-CM | POA: Diagnosis not present

## 2016-12-19 DIAGNOSIS — I639 Cerebral infarction, unspecified: Secondary | ICD-10-CM

## 2016-12-19 DIAGNOSIS — M79669 Pain in unspecified lower leg: Secondary | ICD-10-CM | POA: Diagnosis not present

## 2016-12-19 DIAGNOSIS — M79606 Pain in leg, unspecified: Secondary | ICD-10-CM | POA: Diagnosis present

## 2016-12-19 DIAGNOSIS — I10 Essential (primary) hypertension: Secondary | ICD-10-CM | POA: Diagnosis present

## 2016-12-19 DIAGNOSIS — Z923 Personal history of irradiation: Secondary | ICD-10-CM

## 2016-12-19 DIAGNOSIS — D61810 Antineoplastic chemotherapy induced pancytopenia: Principal | ICD-10-CM | POA: Diagnosis present

## 2016-12-19 DIAGNOSIS — G629 Polyneuropathy, unspecified: Secondary | ICD-10-CM | POA: Diagnosis not present

## 2016-12-19 DIAGNOSIS — E871 Hypo-osmolality and hyponatremia: Secondary | ICD-10-CM | POA: Diagnosis present

## 2016-12-19 DIAGNOSIS — B9620 Unspecified Escherichia coli [E. coli] as the cause of diseases classified elsewhere: Secondary | ICD-10-CM | POA: Diagnosis present

## 2016-12-19 DIAGNOSIS — R630 Anorexia: Secondary | ICD-10-CM | POA: Diagnosis present

## 2016-12-19 DIAGNOSIS — M6281 Muscle weakness (generalized): Secondary | ICD-10-CM | POA: Diagnosis not present

## 2016-12-19 DIAGNOSIS — Z809 Family history of malignant neoplasm, unspecified: Secondary | ICD-10-CM

## 2016-12-19 DIAGNOSIS — R609 Edema, unspecified: Secondary | ICD-10-CM | POA: Diagnosis present

## 2016-12-19 DIAGNOSIS — D638 Anemia in other chronic diseases classified elsewhere: Secondary | ICD-10-CM | POA: Diagnosis present

## 2016-12-19 DIAGNOSIS — Z9181 History of falling: Secondary | ICD-10-CM

## 2016-12-19 HISTORY — DX: Anemia, unspecified: D64.9

## 2016-12-19 LAB — URINALYSIS, COMPLETE (UACMP) WITH MICROSCOPIC
Bilirubin Urine: NEGATIVE
Glucose, UA: NEGATIVE mg/dL
Ketones, ur: NEGATIVE mg/dL
Nitrite: NEGATIVE
PROTEIN: 100 mg/dL — AB
Specific Gravity, Urine: 1.015 (ref 1.005–1.030)
pH: 5 (ref 5.0–8.0)

## 2016-12-19 LAB — COMPREHENSIVE METABOLIC PANEL
ALT: 13 U/L — AB (ref 14–54)
AST: 17 U/L (ref 15–41)
Albumin: 2.8 g/dL — ABNORMAL LOW (ref 3.5–5.0)
Alkaline Phosphatase: 67 U/L (ref 38–126)
Anion gap: 10 (ref 5–15)
BUN: 35 mg/dL — AB (ref 6–20)
CHLORIDE: 97 mmol/L — AB (ref 101–111)
CO2: 24 mmol/L (ref 22–32)
CREATININE: 1.96 mg/dL — AB (ref 0.44–1.00)
Calcium: 8.6 mg/dL — ABNORMAL LOW (ref 8.9–10.3)
GFR calc non Af Amer: 24 mL/min — ABNORMAL LOW (ref >60)
GFR, EST AFRICAN AMERICAN: 28 mL/min — AB (ref >60)
Glucose, Bld: 138 mg/dL — ABNORMAL HIGH (ref 65–99)
Potassium: 3.4 mmol/L — ABNORMAL LOW (ref 3.5–5.1)
Sodium: 131 mmol/L — ABNORMAL LOW (ref 135–145)
Total Bilirubin: 2.6 mg/dL — ABNORMAL HIGH (ref 0.3–1.2)
Total Protein: 5.4 g/dL — ABNORMAL LOW (ref 6.5–8.1)

## 2016-12-19 LAB — PROTIME-INR
INR: 1.42
PROTHROMBIN TIME: 17.4 s — AB (ref 11.4–15.2)

## 2016-12-19 LAB — RAPID URINE DRUG SCREEN, HOSP PERFORMED
AMPHETAMINES: NOT DETECTED
BENZODIAZEPINES: NOT DETECTED
Barbiturates: NOT DETECTED
Cocaine: NOT DETECTED
Opiates: NOT DETECTED
TETRAHYDROCANNABINOL: NOT DETECTED

## 2016-12-19 MED ORDER — HEPARIN SOD (PORK) LOCK FLUSH 100 UNIT/ML IV SOLN: 500.0000 [IU] | INTRAVENOUS | Status: DC | PRN

## 2016-12-19 MED ORDER — ENOXAPARIN SODIUM 30 MG/0.3ML ~~LOC~~ SOLN
30.0000 mg | SUBCUTANEOUS | Status: DC
  Administered 2016-12-19 – 2016-12-22 (×4): 30 mg via SUBCUTANEOUS
  Filled 2016-12-19 (×4): qty 0.3

## 2016-12-19 MED ORDER — SODIUM CHLORIDE 0.9% FLUSH
10.0000 mL | INTRAVENOUS | Status: DC | PRN
  Administered 2016-12-21 – 2016-12-22 (×2): 10 mL
  Filled 2016-12-19 (×2): qty 40

## 2016-12-19 MED ORDER — ENOXAPARIN SODIUM 40 MG/0.4ML ~~LOC~~ SOLN
40.0000 mg | SUBCUTANEOUS | Status: DC
  Filled 2016-12-19: qty 0.4

## 2016-12-19 MED ORDER — ACETAMINOPHEN 650 MG RE SUPP: 650.0000 mg | RECTAL | Status: DC | PRN

## 2016-12-19 MED ORDER — PRAVASTATIN SODIUM 40 MG PO TABS
40.0000 mg | ORAL_TABLET | Freq: Every day | ORAL | Status: DC
  Administered 2016-12-19 – 2016-12-22 (×4): 40 mg via ORAL
  Filled 2016-12-19 (×4): qty 1

## 2016-12-19 MED ORDER — ACETAMINOPHEN 325 MG PO TABS
650.0000 mg | ORAL_TABLET | ORAL | Status: DC | PRN
  Administered 2016-12-20 – 2016-12-22 (×3): 650 mg via ORAL
  Filled 2016-12-19 (×3): qty 2

## 2016-12-19 MED ORDER — POTASSIUM CHLORIDE CRYS ER 20 MEQ PO TBCR
20.0000 meq | EXTENDED_RELEASE_TABLET | Freq: Every day | ORAL | Status: DC
  Administered 2016-12-19 – 2016-12-21 (×3): 20 meq via ORAL
  Filled 2016-12-19 (×3): qty 1

## 2016-12-19 MED ORDER — LEVOTHYROXINE SODIUM 112 MCG PO TABS
112.0000 ug | ORAL_TABLET | Freq: Every day | ORAL | Status: DC
  Administered 2016-12-20 – 2016-12-23 (×4): 112 ug via ORAL
  Filled 2016-12-19 (×4): qty 1

## 2016-12-19 MED ORDER — ACETAMINOPHEN 160 MG/5ML PO SOLN: 650.0000 mg | ORAL | Status: DC | PRN

## 2016-12-19 MED ORDER — SODIUM CHLORIDE 0.9% FLUSH: 10.0000 mL | INTRAVENOUS | Status: DC | PRN

## 2016-12-19 NOTE — Progress Notes (Signed)
Per Lattie Haw, NP patient will be admitted to [facility]- need bed placement, and admitting diagnosis non-hodgkin's lymphoma, gait ataxia, and falls. Called bed control spoke with Dawn- patient should go to admissions at [facility] towers, and she will be in room 5 M 07. Patient and patient's husband informed of the above information, questions answered, and understanding verbalized.

## 2016-12-19 NOTE — Consult Note (Signed)
NEURO HOSPITALIST CONSULT NOTE   Requestig physician: Dr. Saralyn Pilar   Reason for Consult: ataxia   History obtained from:  Patient     HPI:                                                                                                                                          Kelly Ray is an 76 y.o. female per note who is followed by Dr. Benay Spice from oncology as an outpatient for [place]. Per their notes she was seen today and has been following earlier this week. Apparently she lost her balance and feels much weaker in the lower extremities. She is continues to have leg pain below her knees. 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 It was discussed with the patient and her husband noted that this could be an unusual presentation for toxicity related to the chemotherapy. Also discussed with them that it may be possibility that her symptoms are related to progressive lymphoma. For this reason she was asked to go to [facility] for MRI with and without contrast of the brain in addition to be seen by neurology.   As Rituxan is known to cause an greater than 10% of patients headaches, paresthesias, and peripheral neuropathy. In 7-10% of patients taking cause limb pain myalgias, muscle pain and back pain along with dizziness.  Vision states that she finished her rituximab approximate 3 weeks ago. The problem that she is having with the falling and the sensation that she is off balance started approximately 3 weeks ago. Her main complaint is that her lower extremities feel like they are burning extremely painful with pins and needles. She also will get pains in her legs and in her muscles of her thighs when she stands up. She describes as if she gets up, she has severe pain in her lower feet cannot feel where she's walking and then she  gets significant pain in her thighs then her legs give way and she falls. She states that she is often tripped over things that she cannot see where she is going. She now walks with a walker and acknowledges she has to watch where she is going at all times as she cannot feel where her feet are. She denies any vertigo or dizziness.  Recent B12 that was taken 7 days ago was 1383 and TSH was 0.282  Past Medical History:  Diagnosis Date  . Anemia    SECONDARY TO CHEMOTHERAPY  . Arthritis    Osteoarthritis  . B-cell lymphoma ([place]) 05/04/11   Large B-cell lymphoma  . Cancer ([place])    NHL  . Hx of radiation  therapy 08/17/13- 09/03/13   right lower extremity- lymphoma  . Hypertension   . S/P knee replacement    Left knee  . Thyroid disease    Hyperthyroidism    Past Surgical History:  Procedure Laterality Date  . IR GENERIC HISTORICAL  07/13/2016   IR US GUIDE VASC ACCESS RIGHT 07/13/2016 [facility]-INTERV RAD  . IR GENERIC HISTORICAL  07/13/2016   IR FLUORO GUIDE PORT INSERTION RIGHT 07/13/2016 [facility]-INTERV RAD  . NECK LESION BIOPSY Right 05/04/11   node biopsies  . TOTAL KNEE ARTHROPLASTY Left    x 2    Family History  Problem Relation Age of Onset  . Cancer Mother     bladder  . Heart attack Father   . Cancer Brother     kidney     Social History:  reports that she has never smoked. She has never used smokeless tobacco. She reports that she does not drink alcohol or use drugs.  Allergies  Allergen Reactions  . Lisinopril Cough    MEDICATIONS:                                                                                                                     Prior to Admission:  Prescriptions Prior to Admission  Medication Sig Dispense Refill Last Dose  . levothyroxine (SYNTHROID, LEVOTHROID) 112 MCG tablet Take 1 tablet daily 90 tablet 3 Taking  . lidocaine-prilocaine (EMLA) cream Apply 1 application topically as needed. 30 g 1 Taking  . metoprolol tartrate (LOPRESSOR) 25 MG tablet  Take 1 tablet (25 mg total) by mouth 2 (two) times daily. 180 tablet 3 Taking  . potassium chloride SA (K-DUR,KLOR-CON) 20 MEQ tablet Take 1 tablet (20 mEq total) by mouth daily. 7 tablet 0 Taking  . pravastatin (PRAVACHOL) 40 MG tablet Take 1 tablet (40 mg total) by mouth at bedtime. 90 tablet 3 Taking  . prochlorperazine (COMPAZINE) 10 MG tablet Take 1 tablet (10 mg total) by mouth every 6 (six) hours as needed for nausea or vomiting. (Patient not taking: Reported on 12/12/2016) 30 tablet 0 Not Taking   Scheduled: . enoxaparin (LOVENOX) injection  40 mg Subcutaneous Q24H  . [START ON 12/20/2016] levothyroxine  112 mcg Oral QAC breakfast  . potassium chloride SA  20 mEq Oral Daily  . pravastatin  40 mg Oral QHS     ROS:  History obtained from the patient  General ROS: negative for - chills, fatigue, fever, night sweats, weight gain or weight loss Psychological ROS: negative for - behavioral disorder, hallucinations, memory difficulties, mood swings or suicidal ideation Ophthalmic ROS: negative for - blurry vision, double vision, eye pain or loss of vision ENT ROS: negative for - epistaxis, nasal discharge, oral lesions, sore throat, tinnitus or vertigo Allergy and Immunology ROS: negative for - hives or itchy/watery eyes Hematological and Lymphatic ROS: negative for - bleeding problems, bruising or swollen lymph nodes Endocrine ROS: negative for - galactorrhea, hair pattern changes, polydipsia/polyuria or temperature intolerance Respiratory ROS: negative for - cough, hemoptysis, shortness of breath or wheezing Cardiovascular ROS: negative for - chest pain, dyspnea on exertion, edema or irregular heartbeat Gastrointestinal ROS: negative for - abdominal pain, diarrhea, hematemesis, nausea/vomiting or stool incontinence Genito-Urinary ROS: negative for - dysuria,  hematuria, incontinence or urinary frequency/urgency Musculoskeletal ROS: negative for - joint swelling or muscular weakness Neurological ROS: as noted in HPI Dermatological ROS: negative for rash and skin lesion changes   Blood pressure (!) 87/49, pulse (!) 105, temperature 97.9 F (36.6 C), temperature source Oral, resp. rate 16, height 5\' 3"  (1.6 m), weight 76.1 kg (167 lb 11.2 oz), SpO2 100 %.   Neurologic Examination:                                                                                                      HEENT-  Normocephalic, no lesions, without obvious abnormality.  Normal external eye and conjunctiva.  Normal TM's bilaterally.  Normal auditory canals and external ears. Normal external nose, mucus membranes and septum.  Normal pharynx. Cardiovascular- S1, S2 normal, pulses palpable throughout   Lungs- chest clear, no wheezing, rales, normal symmetric air entry Abdomen- normal findings: bowel sounds normal Extremities- Significant pain with palpation of lower extremities Lymph-no adenopathy palpable Musculoskeletal-no joint tenderness, deformity or swelling Skin-warm and dry, no hyperpigmentation, vitiligo, or suspicious lesions  Neurological Examination Mental Status: Alert, oriented, thought content appropriate.  Speech fluent without evidence of aphasia.  Able to follow 3 step commands without difficulty. Cranial Nerves: II:; Visual fields grossly normal, pupils equal, round, reactive to light and accommodation III,IV, VI: ptosis not present, extra-ocular motions intact bilaterally V,VII: smile symmetric, facial light touch sensation normal bilaterally VIII: hearing normal bilaterally IX,X: uvula rises symmetrically XI: bilateral shoulder shrug XII: midline tongue extension Motor: Right : Upper extremity   5/5    Left:     Upper extremity   5/5  Lower extremity   5/5     Lower extremity   5/5 Tone and bulk:normal tone throughout; no atrophy noted Sensory:  Pinprick and light touch intact throughout, bilaterally--patient has allodynia to light touch of lower extremities and mild pressure to upper thighs. She has no proprioception of toes or ankle. She has no vibratory sensation of bilateral feet and bilateral knees. She has decreased sensation to cold temperature up to her knees. Deep Tendon Reflexes: 2+ and symmetric throughout upper extremities with minimal to no knee jerk and no ankle jerk Plantars: Right: downgoing  Left: downgoing --Severe pain with plantar stimulation Cerebellar: normal finger-to-nose,and normal heel-to-shin test Gait: Not tested due to safety.      Lab Results: Basic Metabolic Panel:  Recent Labs Lab 12/12/16 1557  NA 141  K 4.3  CO2 28  GLUCOSE 98  BUN 15.4  CREATININE 0.7  CALCIUM 9.2    Liver Function Tests:  Recent Labs Lab 12/12/16 1557  AST 22  ALT 10  ALKPHOS 92  BILITOT 0.59  PROT 6.3*  ALBUMIN 3.5   No results for input(s): LIPASE, AMYLASE in the last 168 hours. No results for input(s): AMMONIA in the last 168 hours.  CBC: No results for input(s): WBC, NEUTROABS, HGB, HCT, MCV, PLT in the last 168 hours.  Cardiac Enzymes: No results for input(s): CKTOTAL, CKMB, CKMBINDEX, TROPONINI in the last 168 hours.  Lipid Panel: No results for input(s): CHOL, TRIG, HDL, CHOLHDL, VLDL, LDLCALC in the last 168 hours.  CBG: No results for input(s): GLUCAP in the last 168 hours.  Microbiology: Results for orders placed or performed in visit on 11/26/16  Urine Culture     Status: Abnormal   Collection Time: 11/26/16  1:37 PM  Result Value Ref Range Status   Urine Culture, Routine Final report (A)  Final   Urine Culture result 1 Escherichia coli (A)  Final    Comment: Greater than 100,000 colony forming units per mL   ANTIMICROBIAL SUSCEPTIBILITY Comment  Final    Comment:       ** S = Susceptible; I = Intermediate; R = Resistant **                    P = Positive; N = Negative              MICS are expressed in micrograms per mL    Antibiotic                 RSLT#1    RSLT#2    RSLT#3    RSLT#4 Amoxicillin/Clavulanic Acid    I Ampicillin                     R Cefazolin                      R Cefepime                       R Ceftriaxone                    R Cefuroxime                     R Cephalothin                    R Ciprofloxacin                  R Ertapenem                      S Gentamicin                     R Imipenem                       S Levofloxacin                   R Nitrofurantoin  S Piperacillin                   R Tetracycline                   R Tobramycin                     I Trimethoprim/Sulfa             S     Coagulation Studies: No results for input(s): LABPROT, INR in the last 72 hours.  Imaging: No results found.     Assessment and plan per attending neurologist  Etta Quill PA-C [facility] [phone]  12/19/2016, 3:20 PM   Assessment/Plan:  76 year old female with progressive paresthesias and weakness of her legs bilaterally. She just finished her last dose of Rituxan at that time.  Possibilties include oncovin related neuropathy, rituxan related demyelinating neuropathy, carcinomatous meningitis, or less likely myelopathy. I agere with getting imaging of the spine to rule out myelopathic process, but I think this is relatively unlikely. Imaging could also be suggestive of carcinomatous meningitis. If imaging is negative, then would favor performing LP.   1) MRI C and T-spine w/wout contrast 2) If no answers from imaging, would pursue lumbar puncture.  3) PT 4) Neurology will follow.   Roland Rack, MD [facility] [phone]  If 7pm- 7am, please page neurology on call as listed in [place].

## 2016-12-19 NOTE — Progress Notes (Signed)
Radiologist, Bloomer MD, called NP to discuss abnormal findings on pt's MRI brain. "abnormal leukoencephalopathy, ? From chemo vs. Lymphoma spread". Spinal MRIs normal.  NP spoke with Cheral Marker, MD from neuro (they are consulting for the ataxia) about results. He reviewed test and stated there was nothing urgent to do tonight. Consider r/p scan in one month to follow, possible LP. Will leave sign out for am attending reflecting test results and recommendations.  KJKG, NP [facility]

## 2016-12-19 NOTE — Progress Notes (Addendum)
[facility] OFFICE PROGRESS NOTE   Diagnosis:  Non-Hodgkin's lymphoma  INTERVAL HISTORY:   Kelly Ray returns as scheduled. The recent brain CT was negative. She reports falling earlier this week. She lost her balance. She feels she is weaker. She continues to have leg pain below the knees. She states she is "sore all over" due to the recent fall. Balance continues to be a problem. She does not feel dizzy. She had an episode of mild nausea earlier today. Otherwise no nausea or vomiting. She also had what she describes a "terrible headache" this morning. This resolved after taking Aleve. She is concerned she has another urinary tract infection. She reports urine is dark and has an odor. No fevers or sweats. Appetite continues to be poor. No constipation or diarrhea. No visual disturbance. Denies shortness of breath.  Objective:  Vital signs in last 24 hours:  Blood pressure (!) 90/54, pulse (!) 116, temperature 97.5 F (36.4 C), temperature source Oral, resp. rate 18, SpO2 100 %.    HEENT: PERRLA. Extraocular movements intact. Sclera anicteric. Oropharynx is without thrush or ulceration. Resp: Lungs clear bilaterally. Cardio: Regular, tachycardic. GI: Abdomen soft and nontender. No organomegaly. Vascular: No leg edema. Neuro: Alert and oriented. Follows commands. Mild proximal leg weakness. Motor strength otherwise appears intact. Finger to nose intact. Shuffling, ataxic gait.  Skin: No skin nodularity at the right lower leg. Hyperpigmented areas at the right lower lateral leg inferior to the knee and posterior calf region. Hyperpigmented area with small scab right lower medial leg.   Lab Results:  Lab Results  Component Value Date   WBC 4.8 12/12/2016   HGB 8.9 (L) 12/12/2016   HCT 27.6 (L) 12/12/2016   MCV 96.5 12/12/2016   PLT 140 (L) 12/12/2016   NEUTROABS 2.6 12/12/2016    Imaging:  No results found.  Medications: I have reviewed the patient's current  medications.  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 Negative restaging CT evaluation 10/24/2012  Nodular skin lesions at the right lower leg, status post a shave biopsy 07/15/2013 confirming a malignant B-cell lymphoma, diffuse large cell positive for CD20, BCL 6, BCL 2, and CD10   Staging bone marrow biopsy 07/31/2013, negative for involvement with lymphoma   Staging PET scan 07/30/2013-negative.   Status post palliative radiation right lower leg nodular skin lesions 08/17/2013 through 09/03/2013.  New nodular skin lesions at the right lower leg April 2017, status postpsies of lesions at the right lower leg 12/07/2015 confirming large B-cell lymphoma, CD20 positive bio  staging PET scan 12/26/2015-hypermetabolic cutaneous and subcutaneous nodules in the right lower extremity, no other evidence of lymphoma  Cycle 1 bendamustine/Rituxan 01/03/2016  Cycle 2 bendamustine/Rituxan 01/31/2016  Clinical progression with enlargement of multiple right lower leg skin lesions 02/23/2016  Status post radiation right lower leg 03/14/2016 through 03/29/2016  Clinical progression with new and enlarging skin lesions right lower leg 07/06/2016  PET scan 07/12/2016-clear interval increase in size and metabolic activity of a large nodule posterior right calf and a smaller subcutaneous nodule medial right thigh; several more superficial right lower extremity lesions improved with reduction in size and metabolic activity; single focus of metabolic activity associated with the medial right clavicle with bonychange  Cycle 1 CHOP/Rituxan 07/17/2016  Cycle 2 CHOP/Rituxan 08/07/2016-Neulasta added  Cycle 3 CHOP/Rituxan 08/28/2016 with Neulasta support  Restaging PET scan 09/14/2016-hypermetabolic subcutaneous nodules previously demonstrated within the right thigh  and right calf  have nearly resolved. There is some residual activity within the calf which could be postsurgical. Small focus of activity within the left latissimus dorsi muscle within the posterior chest wall without corresponding CT abnormality.  Cycle 4 CHOP/Rituxan 09/18/2016 (Adriamycin deleted, etoposide substituted daily 3)  Cycle 5 CHOP/rituximab with etoposide 10/08/2016  Cycle 6 CHOP/rituximab with etoposide 10/29/2016 2. Port-A-Cath placement 06/01/2011. Port-A-Cath removal 11/04/2012 3. Hypertension. 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 5. Hypercholesterolemia. 6. Osteoarthritis. 7. Malaise-? Secondary to progression of non-Hodgkin's lymphoma. Improved. 8. Staph infection right lower leg February 2015. 9. Right calf lesion with purulent drainage 02/09/2016, culture obtained-group B strep, doxycycline prescribed 10. Port-A-Cath placement 07/13/2016 11. 2-D echo 07/10/2016-estimated ejection fraction range of 55-60% 12. 07/17/2016 hepatitis Bsurface antigen negative, core antibody positive 13. Neutropenia secondary to chemotherapy 07/27/2016 14. Thrombocytopenia secondary to chemotherapy 07/27/2016 15. Anemia secondary to chemotherapy 16. Proximal leg weakness, gait ataxia, falls-negative brain CT 12/13/2016    Disposition: Kelly Ray continues to have lower leg pain and gait ataxia. She had a recent fall. Brain CT last week was negative. We discussed with Kelly Ray and her husband that this would be an unusual presentation for toxicity related to chemotherapy. We discussed the possibility that her symptoms are related to progressive lymphoma. Dr. Benay Spice recommends hospital admission for further neurologic evaluation. She will be admitted to the hospitalist service at [facility].  Patient seen with Dr. Benay Spice. 25 minutes were spent face-to-face at today's visit with the majority of that time involved in counseling/coordination of care.  Ned Card ANP/GNP-BC   12/19/2016  10:32  AM  This was a shared visit with Ned Card. Kelly Ray was interviewed and examined. She has progressive failure to thrive and neurologic symptoms. She has experienced falls at home. She will be admitted for an expedient neurologic evaluation. We are concerned she has developed CNS toxicity from rituximab or progressive lymphoma. I contacted the hospitalist service and they agreed to an admission at [facility].  Julieanne Manson, M.D.

## 2016-12-19 NOTE — Progress Notes (Signed)
New Admission Note:  Arrival Method: wheelchair from [facility]  Mental Orientation: Alert & oriented x 4 Telemetry: box 1 Assessment: Completed Skin: assessment complete  IV: Port On right side of the chest  Pain: 0/10 Tubes: n/a Safety Measures: Safety Fall Prevention Plan was given, discussed. Admission: Completed 5M09: Patient has been orientated to the room, unit and the staff. Family: visiting   Orders have been reviewed and implemented. Will continue to monitor the patient. Call light has been placed within reach and bed alarm has been activated.   Arta Silence ,RN

## 2016-12-19 NOTE — H&P (Signed)
History and Physical    Seven Marengo XQJ:[id number] DOB: 05-Oct-1940 DOA: 12/19/2016   PCP: [name], MD   Patient coming from:  Home    Chief Complaint: Weakness  HPI: Kelly Ray is a 76 y.o. female with medical history significant for Diffuse large B cell lymphoma, last chemotherapy 2 months ago, which appeared to be on remission, presenting with new development of ataxia, dizziness, as well as pronounced proximal weakness with recent fall and failure to thrive Recent CT of the head was one week ago was negative for abnormalities. The patient has been seen this morning by oncology,  Who recommended admission in the hospital for further evaluation, suspecting progression of the lymphoma. Dr. Learta Codding, Oncology, recommended consult by Neurology as well as MRI brain, and spinal MRI to rule out metastatic involvement and he is to see her in am.   The patient reports one week history of increased this balance, feeling weaker, she also reports having leg pain below the knees, described as sore all over after her recent fall. She denies any dizziness or syncope. No chest pain or palpitations. She denies any shortness of breath. She denies any nausea or vomiting. Her appetite is poor. Denies any constipation or diarrhea. She did report a " bad headache this morning "for which she took a leave, with resolution of her symptoms. No confusion is reported. No blurred vision or double vision. She denies any dysuria, but she does nor is increased ordering her urine, in view of recent UTI treated with Bactrim. .  Admission Course:  BP (!) 87/49 (BP Location: Left Arm) Comment: RN aware  Pulse (!) 105   Temp 97.9 F (36.6 C) (Oral)   Resp 16   Ht 5\' 3"  (1.6 m)   Wt 76.1 kg (167 lb 11.2 oz) Comment: Simultaneous filing. User may not have seen previous data.  SpO2 100%   BMI 29.71 kg/m    Workup is ongoing, no labs are available at this time.  Review of Systems: As per HPI otherwise 10 point  review of systems negative.   Past Medical History:  Diagnosis Date  . Anemia    SECONDARY TO CHEMOTHERAPY  . Arthritis    Osteoarthritis  . B-cell lymphoma ([place]) 05/04/11   Large B-cell lymphoma  . Cancer ([place])    NHL  . Hx of radiation therapy 08/17/13- 09/03/13   right lower extremity- lymphoma  . Hypertension   . S/P knee replacement    Left knee  . Thyroid disease    Hyperthyroidism    Past Surgical History:  Procedure Laterality Date  . IR GENERIC HISTORICAL  07/13/2016   IR US GUIDE VASC ACCESS RIGHT 07/13/2016 [facility]  . IR GENERIC HISTORICAL  07/13/2016   IR FLUORO GUIDE PORT INSERTION RIGHT 07/13/2016 [facility]  . NECK LESION BIOPSY Right 05/04/11   node biopsies  . TOTAL KNEE ARTHROPLASTY Left    x 2    Social History Social History   Social History  . Marital status: Married    Spouse name: N/A  . Number of children: N/A  . Years of education: N/A   Occupational History  . Not on file.   Social History Main Topics  . Smoking status: Never Smoker  . Smokeless tobacco: Never Used  . Alcohol use No  . Drug use: No  . Sexual activity: Not on file   Other Topics Concern  . Not on file   Social History Narrative  .  No narrative on file     Allergies  Allergen Reactions  . Lisinopril Cough    Family History  Problem Relation Age of Onset  . Cancer Mother     bladder  . Heart attack Father   . Cancer Brother     kidney      Prior to Admission medications   Medication Sig Start Date End Date Taking? Authorizing Provider  levothyroxine (SYNTHROID, LEVOTHROID) 112 MCG tablet Take 1 tablet daily 05/08/16   Darlyne Russian, MD  lidocaine-prilocaine (EMLA) cream Apply 1 application topically as needed. 07/17/16   Owens Shark, NP  metoprolol tartrate (LOPRESSOR) 25 MG tablet Take 1 tablet (25 mg total) by mouth 2 (two) times daily. 05/07/16   Darlyne Russian, MD  potassium chloride SA (K-DUR,KLOR-CON) 20 MEQ tablet Take 1 tablet (20 mEq  total) by mouth daily. 08/17/16   Susanne Borders, NP  pravastatin (PRAVACHOL) 40 MG tablet Take 1 tablet (40 mg total) by mouth at bedtime. 05/08/16   Darlyne Russian, MD  prochlorperazine (COMPAZINE) 10 MG tablet Take 1 tablet (10 mg total) by mouth every 6 (six) hours as needed for nausea or vomiting. Patient not taking: Reported on 12/12/2016 07/18/16   [name], MD    Physical Exam:  Vitals:   12/19/16 1419  BP: (!) 87/49  Pulse: (!) 105  Resp: 16  Temp: 97.9 F (36.6 C)  TempSrc: Oral  SpO2: 100%  Weight: 76.1 kg (167 lb 11.2 oz)  Height: 5\' 3"  (1.6 m)   Constitutional: NAD, calm, comfortable, Ill appearing, alopecia noted.  Eyes: PERRL, lids and conjunctivae normal ENMT: Mucous membranes are moist, without exudate or lesions  Neck: normal, supple, no masses, no thyromegaly Respiratory: clear to auscultation bilaterally, no wheezing, no crackles. Normal respiratory effort  Cardiovascular: Regular rate and rhythm, no murmurs, rubs or gallops. Trace bilateral  extremity edema. 2+ pedal pulses. No carotid bruits.  Abdomen: Soft, non tender, No hepatosplenomegaly. Bowel sounds positive.  Musculoskeletal: no clubbing / cyanosis. Moves all extremities Skin: no jaundice, No lesions.  Neurologic: Sensation intact  she follows, and appropriately. She has mild proximal lower extremity weakness bilaterally, otherwise her strengths in the distal extremities is intact. Date has not been tested at this point, but per chart note, the patient has been having ataxic gait and shuffling. Psychiatric:   Alert and oriented x 3. Normal mood.     Labs on Admission: I have personally reviewed following labs and imaging studies  CBC: No results for input(s): WBC, NEUTROABS, HGB, HCT, MCV, PLT in the last 168 hours.  Basic Metabolic Panel:  Recent Labs Lab 12/12/16 1557  NA 141  K 4.3  CO2 28  GLUCOSE 98  BUN 15.4  CREATININE 0.7  CALCIUM 9.2    GFR: Estimated Creatinine  Clearance: 60.3 mL/min (by C-G formula based on SCr of 0.7 mg/dL).  Liver Function Tests:  Recent Labs Lab 12/12/16 1557  AST 22  ALT 10  ALKPHOS 92  BILITOT 0.59  PROT 6.3*  ALBUMIN 3.5   No results for input(s): LIPASE, AMYLASE in the last 168 hours. No results for input(s): AMMONIA in the last 168 hours.  Coagulation Profile: No results for input(s): INR, PROTIME in the last 168 hours.  Cardiac Enzymes: No results for input(s): CKTOTAL, CKMB, CKMBINDEX, TROPONINI in the last 168 hours.  BNP (last 3 results) No results for input(s): PROBNP in the last 8760 hours.  HbA1C: No results for input(s): HGBA1C  in the last 72 hours.  CBG: No results for input(s): GLUCAP in the last 168 hours.  Lipid Profile: No results for input(s): CHOL, HDL, LDLCALC, TRIG, CHOLHDL, LDLDIRECT in the last 72 hours.  Thyroid Function Tests: No results for input(s): TSH, T4TOTAL, FREET4, T3FREE, THYROIDAB in the last 72 hours.  Anemia Panel: No results for input(s): VITAMINB12, FOLATE, FERRITIN, TIBC, IRON, RETICCTPCT in the last 72 hours.  Urine analysis:    Component Value Date/Time   COLORURINE YELLOW 03/10/2010 1007   APPEARANCEUR CLEAR 03/10/2010 1007   LABSPEC 1.015 11/26/2016 1337   PHURINE 6.5 11/26/2016 1337   PHURINE 5.0 03/10/2010 1007   GLUCOSEU Negative 11/26/2016 1337   HGBUR Moderate 11/26/2016 1337   HGBUR NEGATIVE 03/10/2010 1007   BILIRUBINUR Negative 11/26/2016 1337   KETONESUR Negative 11/26/2016 1337   KETONESUR NEGATIVE 03/10/2010 1007   PROTEINUR 100 11/26/2016 1337   PROTEINUR NEGATIVE 03/10/2010 1007   UROBILINOGEN 0.2 11/26/2016 1337   NITRITE Positive 11/26/2016 1337   NITRITE NEGATIVE 03/10/2010 1007   LEUKOCYTESUR Large 11/26/2016 1337    Sepsis Labs: @LABRCNTIP (procalcitonin:4,lacticidven:4) )No results found for this or any previous visit (from the past 240 hour(s)).   Radiological Exams on Admission: No results found.  EKG: Independently  reviewed.  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 Benay Spice, Oncology   Bilateral Lower extremity ataxia with recent fall. Recent CT brain negative. However, possibility of progression of lymphoma is to be ruled out, unlikely chemo induced . Will admit the patient for further workup. Her last chemotherapy was 4 weeks ago.  Admit to inpatient telemetry MRI of the brain with contrast, MRI of the C, T and L spine with contrast to rule out lymphoma involvement MRA of the brain oncology to see the patient tomorrow neurology to see the patient, for evaluation of ataxia. Further recommendations as per and oncology.   Hypertension BP 87/49 Pulse  105 patient has had poor oral intake, and has taken her blood pressure medications today.  Hold blood pressure medications today,, and to consider permissive hypertension until results of the MRI results returned.  Recent UTI treated  with Bactrim, patient reports over in urine Check UA, if positive will initiate Rocephin   Anemia of chronic disease, recent chemo Hemoglobin on admission 8.9 no acute bleeding issues  Repeat CBC in am  Hypothyroidism: Continue home Synthroid  Hyperlipidemia Continue home statins    DVT prophylaxis: Lovenox Code Status:   Full    Family Communication:  Discussed with patient Disposition Plan: Expect patient to be discharged to home after condition improves Consults called:    neurology, oncology. Admission status:Tele\npatient    Rondel Jumbo, PA-C [facility]   12/19/2016, 3:39 PM

## 2016-12-20 ENCOUNTER — Telehealth: Payer: Self-pay | Admitting: *Deleted

## 2016-12-20 DIAGNOSIS — N390 Urinary tract infection, site not specified: Secondary | ICD-10-CM

## 2016-12-20 DIAGNOSIS — R209 Unspecified disturbances of skin sensation: Secondary | ICD-10-CM

## 2016-12-20 DIAGNOSIS — C8335 Diffuse large B-cell lymphoma, lymph nodes of inguinal region and lower limb: Secondary | ICD-10-CM

## 2016-12-20 DIAGNOSIS — D649 Anemia, unspecified: Secondary | ICD-10-CM

## 2016-12-20 DIAGNOSIS — R627 Adult failure to thrive: Secondary | ICD-10-CM

## 2016-12-20 DIAGNOSIS — R634 Abnormal weight loss: Secondary | ICD-10-CM

## 2016-12-20 DIAGNOSIS — G629 Polyneuropathy, unspecified: Secondary | ICD-10-CM

## 2016-12-20 DIAGNOSIS — R630 Anorexia: Secondary | ICD-10-CM

## 2016-12-20 DIAGNOSIS — M6281 Muscle weakness (generalized): Secondary | ICD-10-CM

## 2016-12-20 DIAGNOSIS — D696 Thrombocytopenia, unspecified: Secondary | ICD-10-CM

## 2016-12-20 LAB — COMPREHENSIVE METABOLIC PANEL
ALBUMIN: 2.5 g/dL — AB (ref 3.5–5.0)
ALT: 12 U/L — ABNORMAL LOW (ref 14–54)
ANION GAP: 9 (ref 5–15)
AST: 15 U/L (ref 15–41)
Alkaline Phosphatase: 76 U/L (ref 38–126)
BUN: 42 mg/dL — AB (ref 6–20)
CHLORIDE: 99 mmol/L — AB (ref 101–111)
CO2: 24 mmol/L (ref 22–32)
Calcium: 8 mg/dL — ABNORMAL LOW (ref 8.9–10.3)
Creatinine, Ser: 2.15 mg/dL — ABNORMAL HIGH (ref 0.44–1.00)
GFR calc Af Amer: 25 mL/min — ABNORMAL LOW (ref >60)
GFR calc non Af Amer: 21 mL/min — ABNORMAL LOW (ref >60)
GLUCOSE: 99 mg/dL (ref 65–99)
Potassium: 3.4 mmol/L — ABNORMAL LOW (ref 3.5–5.1)
Sodium: 132 mmol/L — ABNORMAL LOW (ref 135–145)
Total Bilirubin: 2.3 mg/dL — ABNORMAL HIGH (ref 0.3–1.2)
Total Protein: 4.9 g/dL — ABNORMAL LOW (ref 6.5–8.1)

## 2016-12-20 LAB — CSF CELL COUNT WITH DIFFERENTIAL
RBC COUNT CSF: 1 /mm3 — AB
RBC Count, CSF: 163 /mm3 — ABNORMAL HIGH
TUBE #: 4
WBC, CSF: 1 /mm3 (ref 0–5)
WBC, CSF: 1 /mm3 (ref 0–5)

## 2016-12-20 LAB — LYMPHOCYTE SUBSETS, FLOW CYTOMETRY (INPT)

## 2016-12-20 LAB — CBC
HCT: 24.7 % — ABNORMAL LOW (ref 36.0–46.0)
Hemoglobin: 8.1 g/dL — ABNORMAL LOW (ref 12.0–15.0)
MCH: 29.9 pg (ref 26.0–34.0)
MCHC: 32.8 g/dL (ref 30.0–36.0)
MCV: 91.1 fL (ref 78.0–100.0)
PLATELETS: 114 10*3/uL — AB (ref 150–400)
RBC: 2.71 MIL/uL — ABNORMAL LOW (ref 3.87–5.11)
RDW: 16 % — AB (ref 11.5–15.5)
WBC: 5.3 10*3/uL (ref 4.0–10.5)

## 2016-12-20 LAB — LACTATE DEHYDROGENASE: LDH: 157 U/L (ref 98–192)

## 2016-12-20 LAB — CORTISOL: Cortisol, Plasma: 22.2 ug/dL

## 2016-12-20 LAB — PROTEIN AND GLUCOSE, CSF
Glucose, CSF: 51 mg/dL (ref 40–70)
Total  Protein, CSF: 28 mg/dL (ref 15–45)

## 2016-12-20 MED ORDER — ENSURE ENLIVE PO LIQD
237.0000 mL | Freq: Two times a day (BID) | ORAL | Status: DC
  Administered 2016-12-20 – 2016-12-23 (×6): 237 mL via ORAL

## 2016-12-20 MED ORDER — SODIUM CHLORIDE 0.9 % IV SOLN
500.0000 mg | Freq: Two times a day (BID) | INTRAVENOUS | Status: DC
  Administered 2016-12-20 – 2016-12-23 (×7): 500 mg via INTRAVENOUS
  Filled 2016-12-20 (×7): qty 0.5

## 2016-12-20 MED ORDER — POTASSIUM CHLORIDE IN NACL 40-0.9 MEQ/L-% IV SOLN
INTRAVENOUS | Status: DC
  Administered 2016-12-20 – 2016-12-21 (×3): 75 mL/h via INTRAVENOUS
  Filled 2016-12-20 (×3): qty 1000

## 2016-12-20 NOTE — Telephone Encounter (Signed)
"  I am in [facility].  Dr. Benay Spice saw me yesterday.  Could you check to see if an order was called in to my pharmacy for my urinary tract infection.  He ordered something a few weeks ago and was to order something again.  The nursing staff said nothing has been ordered yet.  I do not know when I am being discharged.  Please let he and Lattie Haw know.  He said he's going out of town for the weekend.  This is terrible.  My urine smell stinks really bad with a strong odor."  Will notify provider of this request.   No further symptoms reported.  Informed patient the culture and sensitivity is pending.  Suggested she drink lots of water and ask nurse for any items to help decrease odor in room.

## 2016-12-20 NOTE — Evaluation (Signed)
Physical Therapy Evaluation Patient Details Name: Kelly Ray MRN: [id number] DOB: 1941/03/21 Today's Date: 12/20/2016   History of Present Illness  Pt is a 76 y.o. 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 MRI on 4/25 showed Extensive white matter abnormality is a distribution most compatible.   Clinical Impression  Pt presented supine in bed with HOB elevated, awake and willing to participate in therapy session. Prior to admission, pt reported that she has been using a RW to ambulate and is independent with ADLs. Pt lives at home with husband who is available 24/7 to assist her. Pt ambulated in the hallway 84' with RW and min guard. Upon sitting EOB after ambulation, pt noted to be SOB and HR elevated to 149 bpm. Pt also with very poor trunk control in sitting at that time. Pt was assisted back to supine with mod A. Pt would continue to benefit from skilled physical therapy services at this time while admitted and after d/c to address the below listed limitations in order to improve overall safety and independence with functional mobility.     Follow Up Recommendations Home health PT;Supervision/Assistance - 24 hour    Equipment Recommendations  None recommended by PT;Other (comment) (pt has all necessary DME at home)    Recommendations for Other Services       Precautions / Restrictions Precautions Precautions: Fall Restrictions Weight Bearing Restrictions: No      Mobility  Bed Mobility Overal bed mobility: Needs Assistance Bed Mobility: Supine to Sit;Sit to Supine     Supine to sit: Min guard Sit to supine: Mod assist   General bed mobility comments: increased physical assistance provided for return to supine due to elevated HR (149 bpm),  increased WOB and SOB.  Transfers Overall transfer level: Needs assistance Equipment used: Rolling walker (2 wheeled) Transfers: Sit to/from Stand Sit to Stand: Min assist         General transfer comment: increased time, VC'ing for bilateral hand placement, min A for stability with rise from bed  Ambulation/Gait Ambulation/Gait assistance: Min guard Ambulation Distance (Feet): 50 Feet Assistive device: Rolling walker (2 wheeled) Gait Pattern/deviations: Step-through pattern;Decreased step length - right;Decreased step length - left;Decreased stride length (significant pronation bilaterally) Gait velocity: decreased Gait velocity interpretation: Below normal speed for age/gender General Gait Details: mild instability with ambulation but no LOB or need for physical assistance, min guard for safety  Stairs            Wheelchair Mobility    Modified Rankin (Stroke Patients Only)       Balance Overall balance assessment: Needs assistance;History of Falls Sitting-balance support: Feet supported;No upper extremity supported Sitting balance-Leahy Scale: Poor Sitting balance - Comments: min guard to mod assist, pt with poor trunk control during EOB LE activities (attempted MMT, but pt could not tolerate)   Standing balance support: Bilateral upper extremity supported Standing balance-Leahy Scale: Poor Standing balance comment: pt reliant on bilateral UEs on RW                             Pertinent Vitals/Pain Pain Assessment: Faces Faces Pain Scale: Hurts a little bit Pain Location: head Pain Descriptors / Indicators: Headache;Heaviness Pain Intervention(s): Monitored during session;Repositioned    Home Living  Family/patient expects to be discharged to:: Private residence Living Arrangements: Spouse/significant other Available Help at Discharge: Family;Available 24 hours/day Type of Home: House Home Access: Stairs to enter Entrance Stairs-Rails:  Psychiatric nurse of Steps: 2 Home Layout: One level Home Equipment: Walker - 2 wheels;Shower seat - built in;Grab bars - toilet;Grab bars - tub/shower Additional Comments: Pt reports 3 falls in past 6 months due to LE weakness.    Prior Function Level of Independence: Independent with assistive device(s)         Comments: Has been using RW recently for mobility; independent with ADL     Hand Dominance        Extremity/Trunk Assessment   Upper Extremity Assessment Upper Extremity Assessment: Defer to OT evaluation    Lower Extremity Assessment Lower Extremity Assessment: Generalized weakness    Cervical / Trunk Assessment Cervical / Trunk Assessment: Kyphotic  Communication   Communication: No difficulties  Cognition Arousal/Alertness: Awake/alert Behavior During Therapy: WFL for tasks assessed/performed Overall Cognitive Status: Within Functional Limits for tasks assessed                                        General Comments      Exercises     Assessment/Plan    PT Assessment Patient needs continued PT services  PT Problem List Decreased strength;Decreased activity tolerance;Decreased balance;Decreased mobility;Decreased coordination;Cardiopulmonary status limiting activity       PT Treatment Interventions DME instruction;Gait training;Stair training;Functional mobility training;Therapeutic activities;Therapeutic exercise;Balance training;Neuromuscular re-education;Patient/family education    PT Goals (Current goals can be found in the Care Plan section)  Acute Rehab PT Goals Patient Stated Goal: return home PT Goal Formulation: With patient Time For Goal Achievement: 01/03/17 Potential to Achieve Goals: Fair    Frequency Min 3X/week   Barriers to discharge        Co-evaluation PT/OT/SLP Co-Evaluation/Treatment: Yes Reason for Co-Treatment: For patient/therapist safety;To address functional/ADL transfers;Other  (comment) (activity tolerance following LP) PT goals addressed during session: Mobility/safety with mobility;Balance;Proper use of DME OT goals addressed during session: ADL's and self-care       End of Session Equipment Utilized During Treatment: Gait belt Activity Tolerance: Other (comment) (pt limited secondary to increased HR (up to 149) after walk) Patient left: in bed;with call bell/phone within reach;with bed alarm set Nurse Communication: Mobility status;Other (comment) (increased HR with activity) PT Visit Diagnosis: Other abnormalities of gait and mobility (R26.89)    Time: 1425-1450 PT Time Calculation (min) (ACUTE ONLY): 25 min   Charges:   PT Evaluation $PT Eval Moderate Complexity: 1 Procedure     PT G Codes:        Sherie Don, PT, DPT [place] 12/20/2016, 4:15 PM

## 2016-12-20 NOTE — Progress Notes (Signed)
Subjective: No change over night. Is aware she is having LP today.  Continues to have same symptoms and no increase in pain.   Husband is extremely worried about her weight loss and the fact that she's not eating.  Exam: Vitals:   12/20/16 0330 12/20/16 0518  BP: (!) 92/52 (!) 96/58  Pulse: 99 96  Resp: 18 20  Temp: 98.2 F (36.8 C) 98.7 F (37.1 C)    HEENT-  Normocephalic, no lesions, without obvious abnormality.  Normal external eye and conjunctiva.  Normal TM's bilaterally.  Normal auditory canals and external ears. Normal external nose, mucus membranes and septum.  Normal pharynx.    Neuro:  CN: Pupils are equal and round. They are symmetrically reactive from 3-->2 mm. EOMI without nystagmus. Facial sensation is intact to light touch. Face is symmetric at rest with normal strength and mobility. Hearing is intact to conversational voice. Palate elevates symmetrically and uvula is midline. Voice is normal in tone, pitch and quality. Bilateral SCM and trapezii are 5/5. Tongue is midline with normal bulk and mobility.  Sensory: Pinprick and light touch intact throughout, bilaterally--patient has allodynia to light touch of lower extremities and mild pressure to upper thighs. She has no proprioception of toes or ankle. She has no vibratory sensation of bilateral feet and bilateral knees. She has decreased sensation to cold temperature up to her knees. 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 Given patient's history of acute renal failure, contrast is likely contraindicated. Consider correlation with cerebral spinal fluid studies and short-term follow-up.  MRI cervical/thoracic/lumbar spine: MRI CERVICAL SPINE: No MR findings of lymphoma, sensitivity decreased by patient motion and lack of contrast.  Degenerative cervical spine resulting in mild canal stenosis C6-7. Multilevel neural foraminal narrowing: Likely severe on the LEFT at C3-4 though limited by motion.  MRI THORACIC SPINE: No MR findings of lymphoma, sensitivity decreased by patient motion and lack of contrast.  Mild old T11 compression fracture. No canal stenosis or neural foraminal narrowing.  MRI LUMBAR SPINE: No MR findings of lymphoma, sensitivity decreased by lack of contrast.  Mild to moderate old L1 compression fracture. Grade 1 L1-2 and L2-3 retrolisthesis.  Degenerative lumbar spine resulting in moderate canal stenosis L3-4 and L4-5. Multilevel neural foraminal narrowing: Mild to moderate on the RIGHT at L3-4.   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 Possibilities include Oncovin related neuropathy, Rituxan related neuropathy, carcinomatous meningitis, Guillian-barre syndrome.   Recommendations: Lumbar puncture today Further recognitions following lumbar puncture   Roland Rack, MD [facility] [phone]  If 7pm- 7am, please page neurology on call as listed in [facility].  12/20/2016, 10:43 AM

## 2016-12-20 NOTE — Evaluation (Signed)
Occupational Therapy Evaluation Patient Details Name: Kelly Ray MRN: [id number] DOB: 09-19-1940 Today's Date: 12/20/2016    History of Present Illness 76 y.o. 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 MRI on 4/25 showed Extensive white matter abnormality is a distribution most compatible.    Clinical Impression   Pt reports she was independent with ADL PTA. Currently pt overall min assist for ADL and functional mobility. Following functional mobility, pt sitting EOB while PT MMT LEs. Pt with poor trunk control reporting she feels like she is going to fall over; pt with SOB (SpO2=92%) and HR elevated (high 140s bmp max). Returned pt to supine and educated on breathing strategies. Pt planning to d/c home with 24/7 supervision from her husband. Pt would benefit from continued skilled OT to address established goals.     Follow Up Recommendations  No OT follow up;Supervision/Assistance - 24 hour    Equipment Recommendations  3 in 1 bedside commode (to use as shower chair)    Recommendations for Other Services       Precautions / Restrictions Precautions Precautions: Fall Restrictions Weight Bearing Restrictions: No      Mobility Bed Mobility Overal bed mobility: Needs Assistance Bed Mobility: Supine to Sit;Sit to Supine     Supine to sit: Min guard Sit to supine: Mod assist   General bed mobility comments: Assist provided for return to supine due to high HR and SOB.  Transfers Overall transfer level: Needs assistance Equipment used: Rolling walker (2 wheeled) Transfers: Sit to/from Stand Sit to Stand: Min assist         General transfer comment: Min assist for steadying balance. Cues for hand placement    Balance Overall balance  assessment: Needs assistance;History of Falls Sitting-balance support: Feet supported;No upper extremity supported Sitting balance-Leahy Scale: Poor Sitting balance - Comments: min guard to mod assist. poor trunk control   Standing balance support: Bilateral upper extremity supported Standing balance-Leahy Scale: Poor                             ADL either performed or assessed with clinical judgement   ADL Overall ADL's : Needs assistance/impaired Eating/Feeding: Set up;Sitting   Grooming: Min guard;Sitting   Upper Body Bathing: Minimal assistance;Sitting   Lower Body Bathing: Minimal assistance;Sit to/from stand   Upper Body Dressing : Minimal assistance;Sitting   Lower Body Dressing: Minimal assistance;Sit to/from stand Lower Body Dressing Details (indicate cue type and reason): Pt able to doff/don sock sitting EOB Toilet Transfer: Minimal assistance;Ambulation;Comfort height toilet;RW Armed forces technical officer Details (indicate cue type and reason): Simulated by sit to stand from EOB with functional mobility         Functional mobility during ADLs: Minimal assistance;Rolling walker General ADL Comments: While sitting EOB following mobility; PT testing MMT at EOB, pt with poor trunk control stating she feels like shes going to fall over. HR in 140s; returned pt to supine in bed with decrease in HR to 120s. Pt mildly SOB; SpO2=92%. Began energy conservation and discussed pursed lip breathing.     Vision         Perception     Praxis      Pertinent Vitals/Pain Pain Assessment: Faces Faces Pain Scale: Hurts a little bit Pain  Location: head Pain Descriptors / Indicators: Headache;Heaviness Pain Intervention(s): Monitored during session     Hand Dominance     Extremity/Trunk Assessment Upper Extremity Assessment Upper Extremity Assessment: Overall WFL for tasks assessed   Lower Extremity Assessment Lower Extremity Assessment: Defer to PT evaluation    Cervical / Trunk Assessment Cervical / Trunk Assessment: Kyphotic   Communication Communication Communication: No difficulties   Cognition Arousal/Alertness: Awake/alert Behavior During Therapy: WFL for tasks assessed/performed Overall Cognitive Status: Within Functional Limits for tasks assessed                                     General Comments       Exercises     Shoulder Instructions      Home Living Family/patient expects to be discharged to:: Private residence Living Arrangements: Spouse/significant other Available Help at Discharge: Family;Available 24 hours/day Type of Home: House Home Access: Stairs to enter [employer] of Steps: 2 Entrance Stairs-Rails: Right;Left Home Layout: One level     Bathroom Shower/Tub: Occupational psychologist: Handicapped height     Home Equipment: Environmental consultant - 2 wheels;Shower seat - built in;Grab bars - toilet;Grab bars - tub/shower   Additional Comments: Pt reports 3 falls in past 6 months due to LE weakness.      Prior Functioning/Environment Level of Independence: Independent with assistive device(s)        Comments: Has been using RW recently for mobility; independent with ADL        OT Problem List: Decreased strength;Decreased activity tolerance;Impaired balance (sitting and/or standing);Cardiopulmonary status limiting activity      OT Treatment/Interventions: Self-care/ADL training;Energy conservation;DME and/or AE instruction;Therapeutic activities;Patient/family education;Balance training    OT Goals(Current goals can be found in the care plan section) Acute Rehab OT Goals Patient Stated Goal: return home OT Goal Formulation: With patient Time For Goal Achievement: 01/03/17 Potential to Achieve Goals: Good ADL Goals Pt Will Perform Grooming: with supervision;standing;sitting Pt Will Transfer to Toilet: with supervision;ambulating;regular height toilet Pt Will Perform  Toileting - Clothing Manipulation and hygiene: with supervision;sit to/from stand Pt Will Perform Tub/Shower Transfer: Shower transfer;with supervision;ambulating;3 in 1;rolling walker Additional ADL Goal #1: Pt will independently verbally recall 3 energy conservation strategies and use during ADL.  OT Frequency: Min 2X/week   Barriers to D/C:            Co-evaluation PT/OT/SLP Co-Evaluation/Treatment: Yes Reason for Co-Treatment: For patient/therapist safety;Other (comment) (activity tolerance) PT goals addressed during session: Mobility/safety with mobility OT goals addressed during session: ADL's and self-care      End of Session Equipment Utilized During Treatment: Gait belt;Rolling walker Nurse Communication: Mobility status;Other (comment) (elevated HR )  Activity Tolerance: Patient tolerated treatment well Patient left: in bed;with call bell/phone within reach;with bed alarm set  OT Visit Diagnosis: Unsteadiness on feet (R26.81);Repeated falls (R29.6);Ataxia, unspecified (R27.0);Muscle weakness (generalized) (M62.81)                Time: 0623-7628 OT Time Calculation (min): 28 min Charges:  OT General Charges $OT Visit: 1 Procedure OT Evaluation $OT Eval Moderate Complexity: 1 Procedure G-Codes:     Kelly Ray A. Ulice Brilliant, M.S., OTR/L Pager: [place] 12/20/2016, 3:00 PM

## 2016-12-20 NOTE — Progress Notes (Addendum)
IP PROGRESS NOTE  Subjective:   She continues to have anorexia. She underwent a brain and spine MRI last night. No new complaint.  Objective: Vital signs in last 24 hours: Blood pressure (!) 95/55, pulse (!) 106, temperature 98.4 F (36.9 C), temperature source Oral, resp. rate 20, height 5\' 3"  (1.6 m), weight 167 lb 11.2 oz (76.1 kg), SpO2 97 %.  Intake/Output from previous day: 04/25 0701 - 04/26 0700 In: -  Out: 300 [Urine:300]  Physical Exam:  Lungs: Clear bilaterally Cardiac: Regular rate and rhythm Abdomen: No hepatosplenomegaly Extremities: No leg edema Neurologic: Foot strength appears intact bilaterally  Portacath/PICC-without erythema  Lab Results:  Recent Labs  12/20/16 0424  WBC 5.3  HGB 8.1*  HCT 24.7*  PLT 114*    BMET  Recent Labs  12/19/16 1658 12/20/16 0424  NA 131* 132*  K 3.4* 3.4*  CL 97* 99*  CO2 24 24  GLUCOSE 138* 99  BUN 35* 42*  CREATININE 1.96* 2.15*  CALCIUM 8.6* 8.0*    Studies/Results: Mr Brain Wo Contrast  Addendum Date: 12/19/2016   ADDENDUM REPORT: 12/19/2016 22:36 ADDENDUM: Acute findings discussed with and reconfirmed by Dr.Kirby, [facility] on 12/19/2016 at 9:44 p.m. Electronically Signed   By: 12/21/2016 M.D.   On: 12/19/2016 22:36   Result Date: 12/19/2016 CLINICAL DATA:  Headache today, new onset ataxia and dizziness, proximal weakness and recent fall. History of lymphoma, last chemotherapy 2 months ago. EXAM: MRI HEAD WITHOUT CONTRAST TECHNIQUE: Multiplanar, multiecho pulse sequences of the brain and surrounding structures were obtained without intravenous contrast. GFR 24, in acute renal failure. COMPARISON:  None. FINDINGS: BRAIN: Patchy to confluent reduced diffusion in the periventricular white matter, most prominent within the splenium of the corpus callosum with intermediate to low ADC values, a amorphous bright FLAIR T2 hyperintense signal. No midline shift, mass effect or discrete masses. Ventricles and sulci  are normal for patient's age. No abnormal extra-axial fluid collections. No susceptibility artifact to suggest hemorrhage. VASCULAR: Normal major intracranial vascular flow voids present at skull base. SKULL AND UPPER CERVICAL SPINE: No abnormal sellar expansion. No suspicious calvarial bone marrow signal. Craniocervical junction maintained. SINUSES/ORBITS: The mastoid air-cells and included paranasal sinuses are well-aerated. The included ocular globes and orbital contents are non-suspicious. OTHER: None. 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 Given patient's history of acute renal failure, contrast is likely contraindicated. Consider correlation with cerebral spinal fluid studies and short-term follow-up. Electronically Signed: By: 12/21/2016 M.D. On: 12/19/2016 21:12   Mr Cervical Spine Wo Contrast  Result Date: 12/19/2016 CLINICAL DATA:  Headache today. New onset ataxia and dizziness, proximal weakness and recent falls. History of lymphoma, last chemotherapy 2 months ago. EXAM: MRI CERVICAL, THORACIC AND LUMBAR SPINE WITHOUT CONTRAST TECHNIQUE: Multiplanar and multiecho pulse sequences of the cervical spine, to include the craniocervical junction and cervicothoracic junction, and thoracic and lumbar spine, were obtained without intravenous contrast. GFR 24. COMPARISON:  PET-CT October 15, 2016 FINDINGS: MRI CERVICAL SPINE FINDINGS- axial sequences are moderately motion degraded ALIGNMENT: Straightened cervical lordosis.  No malalignment. VERTEBRAE/DISCS: Vertebral bodies are intact. Moderate C5-6 and C6-7 disc height loss, with mild chronic discogenic endplate changes compatible with degenerative discs. Mild disc desiccation all cervical levels. Generalized bright T1 and bright T2 bone marrow signal compatible with osteopenia. No suspicious or  acute bone marrow signal. CORD:Cervical spinal cord is normal morphology and signal characteristics from the cervicomedullary  junction to level of T1-2, the most caudal well visualized level. POSTERIOR FOSSA, VERTEBRAL ARTERIES, PARASPINAL TISSUES: No MR findings of ligamentous injury. Vertebral artery flow voids present. Included posterior fossa and paraspinal soft tissues are normal. DISC LEVELS: C2-3:  No disc bulge, canal stenosis nor neural foraminal narrowing. C3-4: Small broad-based disc bulge LEFT subarticular disc protrusion suspected. Uncovertebral hypertrophy. Mild RIGHT, severe LEFT facet arthropathy. No canal stenosis. Severe suspected LEFT neural foraminal narrowing, mild on the RIGHT. C4-5: No disc bulge. Uncovertebral hypertrophy. Mild RIGHT, moderate LEFT facet arthropathy without canal stenosis. Mild probable LEFT neural foraminal narrowing. C5-6: Small broad-based disc bulge uncovertebral hypertrophy without canal stenosis. Mild to moderate probable neural foraminal narrowing. C6-7: Small broad-based disc bulge asymmetric to the RIGHT. Uncovertebral hypertrophy. Mild canal stenosis. At least mild RIGHT neural foraminal narrowing. C7-T1: No disc bulge, canal stenosis. Mild facet arthropathy. Mild RIGHT, mild to moderate LEFT probable neural foraminal narrowing. MRI THORACIC SPINE FINDINGS-- mildly motion degraded examination. ALIGNMENT: Maintenance of the thoracic kyphosis. No malalignment. VERTEBRAE/DISCS: Mild old T11 compression fracture with less than 25% superior endplate height loss. Generalized bright T1 and bright T2 bone marrow signal compatible with osteopenia without acute or abnormal bone marrow signal. Mild T10-11 disc height loss and desiccation compatible with degenerative disc, mild chronic discogenic endplate changes. Scattered old Schmorl's nodes. CORD: Thoracic spinal cord is normal morphology and signal characteristics to the level of the conus medullaris. PREVERTEBRAL AND  PARASPINAL SOFT TISSUES:  Normal. DISC LEVELS: No significant disc bulge, canal stenosis or neural foraminal narrowing at any level. MRI LUMBAR SPINE FINDINGS SEGMENTATION:  Consistent with cervical and thoracic spine labeling. ALIGNMENT: Maintenance of the lumbar lordosis. Grade 1 L1-2 retrolisthesis and grade 1 L2-3 retrolisthesis. VERTEBRAE:Old mild-to-moderate L1 compression fracture with less than 30% superior endplate height loss. Severe L1-2 disc height loss, moderate to severe L2-3 and L3-4 with moderate subacute on chronic discogenic endplate changes. Multilevel acute on chronic Schmorl's nodes. Generalized bright T1 and bright T2 bone marrow signal compatible with osteopenia without acute or abnormal bone marrow signal. CONUS MEDULLARIS: Conus medullaris terminates at T12-L1 and demonstrates normal morphology and signal characteristics. Cauda equina is normal. PREVERTEBRAL AND PARASPINAL SOFT TISSUES: Nonacute. Cholelithiasis. Mild symmetric paraspinal muscle atrophy. DISC LEVELS: L1-2: Retrolisthesis. Small broad-based disc bulge asymmetric to LEFT. Mild canal stenosis including partial effacement LEFT lateral recess which may affect the traversing LEFT L2 nerve. Mild bilateral neural foraminal narrowing. L2-3: Retrolisthesis. Small broad-based disc bulge. Mild facet arthropathy and ligamentum flavum redundancy. Mild canal stenosis including partial effacement RIGHT greater than LEFT lateral recesses which may affect the traversing L3 nerves. Mild bilateral neural foraminal narrowing. L3-4: Small broad-based disc bulge. Moderate to severe RIGHT and moderate LEFT facet arthropathy and ligamentum flavum redundancy resulting in moderate canal stenosis. Mild to moderate RIGHT neural foraminal narrowing. L4-5: Annular bulging. Moderate to severe facet arthropathy and ligamentum flavum redundancy resulting in moderate canal stenosis. Mild RIGHT and minimal LEFT neural foraminal narrowing. L5-S1: Small  broad-based disc bulge. Severe LEFT greater than RIGHT facet arthropathy and ligamentum flavum redundancy without canal stenosis. 14 mm RIGHT facet synovial cyst extending the paraspinal soft tissues. No canal stenosis. Minimal LEFT neural foraminal narrowing. IMPRESSION: MRI CERVICAL SPINE: No MR findings of lymphoma, sensitivity decreased by patient motion and lack of contrast. Degenerative cervical spine resulting in mild canal stenosis C6-7. Multilevel neural foraminal narrowing: Likely severe on the LEFT at C3-4 though limited by motion. MRI THORACIC SPINE: No MR findings of lymphoma, sensitivity decreased by  patient motion and lack of contrast. Mild old T11 compression fracture. No canal stenosis or neural foraminal narrowing. MRI LUMBAR SPINE: No MR findings of lymphoma, sensitivity decreased by lack of contrast. Mild to moderate old L1 compression fracture. Grade 1 L1-2 and L2-3 retrolisthesis. Degenerative lumbar spine resulting in moderate canal stenosis L3-4 and L4-5. Multilevel neural foraminal narrowing: Mild to moderate on the RIGHT at L3-4. Electronically Signed   By: Elon Alas M.D.   On: 12/19/2016 21:42   Mr Thoracic Spine Wo Contrast  Result Date: 12/19/2016 CLINICAL DATA:  Headache today. New onset ataxia and dizziness, proximal weakness and recent falls. History of lymphoma, last chemotherapy 2 months ago. EXAM: MRI CERVICAL, THORACIC AND LUMBAR SPINE WITHOUT CONTRAST TECHNIQUE: Multiplanar and multiecho pulse sequences of the cervical spine, to include the craniocervical junction and cervicothoracic junction, and thoracic and lumbar spine, were obtained without intravenous contrast. GFR 24. COMPARISON:  PET-CT October 15, 2016 FINDINGS: MRI CERVICAL SPINE FINDINGS- axial sequences are moderately motion degraded ALIGNMENT: Straightened cervical lordosis.  No malalignment. VERTEBRAE/DISCS: Vertebral bodies are intact. Moderate C5-6 and C6-7 disc height loss, with mild chronic  discogenic endplate changes compatible with degenerative discs. Mild disc desiccation all cervical levels. Generalized bright T1 and bright T2 bone marrow signal compatible with osteopenia. No suspicious or acute bone marrow signal. CORD:Cervical spinal cord is normal morphology and signal characteristics from the cervicomedullary junction to level of T1-2, the most caudal well visualized level. POSTERIOR FOSSA, VERTEBRAL ARTERIES, PARASPINAL TISSUES: No MR findings of ligamentous injury. Vertebral artery flow voids present. Included posterior fossa and paraspinal soft tissues are normal. DISC LEVELS: C2-3:  No disc bulge, canal stenosis nor neural foraminal narrowing. C3-4: Small broad-based disc bulge LEFT subarticular disc protrusion suspected. Uncovertebral hypertrophy. Mild RIGHT, severe LEFT facet arthropathy. No canal stenosis. Severe suspected LEFT neural foraminal narrowing, mild on the RIGHT. C4-5: No disc bulge. Uncovertebral hypertrophy. Mild RIGHT, moderate LEFT facet arthropathy without canal stenosis. Mild probable LEFT neural foraminal narrowing. C5-6: Small broad-based disc bulge uncovertebral hypertrophy without canal stenosis. Mild to moderate probable neural foraminal narrowing. C6-7: Small broad-based disc bulge asymmetric to the RIGHT. Uncovertebral hypertrophy. Mild canal stenosis. At least mild RIGHT neural foraminal narrowing. C7-T1: No disc bulge, canal stenosis. Mild facet arthropathy. Mild RIGHT, mild to moderate LEFT probable neural foraminal narrowing. MRI THORACIC SPINE FINDINGS-- mildly motion degraded examination. ALIGNMENT: Maintenance of the thoracic kyphosis. No malalignment. VERTEBRAE/DISCS: Mild old T11 compression fracture with less than 25% superior endplate height loss. Generalized bright T1 and bright T2 bone marrow signal compatible with osteopenia without acute or abnormal bone marrow signal. Mild T10-11 disc height loss and desiccation compatible with degenerative disc,  mild chronic discogenic endplate changes. Scattered old Schmorl's nodes. CORD: Thoracic spinal cord is normal morphology and signal characteristics to the level of the conus medullaris. PREVERTEBRAL AND PARASPINAL SOFT TISSUES:  Normal. DISC LEVELS: No significant disc bulge, canal stenosis or neural foraminal narrowing at any level. MRI LUMBAR SPINE FINDINGS SEGMENTATION:  Consistent with cervical and thoracic spine labeling. ALIGNMENT: Maintenance of the lumbar lordosis. Grade 1 L1-2 retrolisthesis and grade 1 L2-3 retrolisthesis. VERTEBRAE:Old mild-to-moderate L1 compression fracture with less than 30% superior endplate height loss. Severe L1-2 disc height loss, moderate to severe L2-3 and L3-4 with moderate subacute on chronic discogenic endplate changes. Multilevel acute on chronic Schmorl's nodes. Generalized bright T1 and bright T2 bone marrow signal compatible with osteopenia without acute or abnormal bone marrow signal. CONUS MEDULLARIS: Conus medullaris terminates at T12-L1 and  demonstrates normal morphology and signal characteristics. Cauda equina is normal. PREVERTEBRAL AND PARASPINAL SOFT TISSUES: Nonacute. Cholelithiasis. Mild symmetric paraspinal muscle atrophy. DISC LEVELS: L1-2: Retrolisthesis. Small broad-based disc bulge asymmetric to LEFT. Mild canal stenosis including partial effacement LEFT lateral recess which may affect the traversing LEFT L2 nerve. Mild bilateral neural foraminal narrowing. L2-3: Retrolisthesis. Small broad-based disc bulge. Mild facet arthropathy and ligamentum flavum redundancy. Mild canal stenosis including partial effacement RIGHT greater than LEFT lateral recesses which may affect the traversing L3 nerves. Mild bilateral neural foraminal narrowing. L3-4: Small broad-based disc bulge. Moderate to severe RIGHT and moderate LEFT facet arthropathy and ligamentum flavum redundancy resulting in moderate canal stenosis. Mild to moderate RIGHT neural foraminal narrowing. L4-5:  Annular bulging. Moderate to severe facet arthropathy and ligamentum flavum redundancy resulting in moderate canal stenosis. Mild RIGHT and minimal LEFT neural foraminal narrowing. L5-S1: Small broad-based disc bulge. Severe LEFT greater than RIGHT facet arthropathy and ligamentum flavum redundancy without canal stenosis. 14 mm RIGHT facet synovial cyst extending the paraspinal soft tissues. No canal stenosis. Minimal LEFT neural foraminal narrowing. IMPRESSION: MRI CERVICAL SPINE: No MR findings of lymphoma, sensitivity decreased by patient motion and lack of contrast. Degenerative cervical spine resulting in mild canal stenosis C6-7. Multilevel neural foraminal narrowing: Likely severe on the LEFT at C3-4 though limited by motion. MRI THORACIC SPINE: No MR findings of lymphoma, sensitivity decreased by patient motion and lack of contrast. Mild old T11 compression fracture. No canal stenosis or neural foraminal narrowing. MRI LUMBAR SPINE: No MR findings of lymphoma, sensitivity decreased by lack of contrast. Mild to moderate old L1 compression fracture. Grade 1 L1-2 and L2-3 retrolisthesis. Degenerative lumbar spine resulting in moderate canal stenosis L3-4 and L4-5. Multilevel neural foraminal narrowing: Mild to moderate on the RIGHT at L3-4. Electronically Signed   By: Elon Alas M.D.   On: 12/19/2016 21:42   Mr Lumbar Spine Wo Contrast  Result Date: 12/19/2016 CLINICAL DATA:  Headache today. New onset ataxia and dizziness, proximal weakness and recent falls. History of lymphoma, last chemotherapy 2 months ago. EXAM: MRI CERVICAL, THORACIC AND LUMBAR SPINE WITHOUT CONTRAST TECHNIQUE: Multiplanar and multiecho pulse sequences of the cervical spine, to include the craniocervical junction and cervicothoracic junction, and thoracic and lumbar spine, were obtained without intravenous contrast. GFR 24. COMPARISON:  PET-CT October 15, 2016 FINDINGS: MRI CERVICAL SPINE FINDINGS- axial sequences are  moderately motion degraded ALIGNMENT: Straightened cervical lordosis.  No malalignment. VERTEBRAE/DISCS: Vertebral bodies are intact. Moderate C5-6 and C6-7 disc height loss, with mild chronic discogenic endplate changes compatible with degenerative discs. Mild disc desiccation all cervical levels. Generalized bright T1 and bright T2 bone marrow signal compatible with osteopenia. No suspicious or acute bone marrow signal. CORD:Cervical spinal cord is normal morphology and signal characteristics from the cervicomedullary junction to level of T1-2, the most caudal well visualized level. POSTERIOR FOSSA, VERTEBRAL ARTERIES, PARASPINAL TISSUES: No MR findings of ligamentous injury. Vertebral artery flow voids present. Included posterior fossa and paraspinal soft tissues are normal. DISC LEVELS: C2-3:  No disc bulge, canal stenosis nor neural foraminal narrowing. C3-4: Small broad-based disc bulge LEFT subarticular disc protrusion suspected. Uncovertebral hypertrophy. Mild RIGHT, severe LEFT facet arthropathy. No canal stenosis. Severe suspected LEFT neural foraminal narrowing, mild on the RIGHT. C4-5: No disc bulge. Uncovertebral hypertrophy. Mild RIGHT, moderate LEFT facet arthropathy without canal stenosis. Mild probable LEFT neural foraminal narrowing. C5-6: Small broad-based disc bulge uncovertebral hypertrophy without canal stenosis. Mild to moderate probable neural foraminal narrowing. C6-7: Small broad-based  disc bulge asymmetric to the RIGHT. Uncovertebral hypertrophy. Mild canal stenosis. At least mild RIGHT neural foraminal narrowing. C7-T1: No disc bulge, canal stenosis. Mild facet arthropathy. Mild RIGHT, mild to moderate LEFT probable neural foraminal narrowing. MRI THORACIC SPINE FINDINGS-- mildly motion degraded examination. ALIGNMENT: Maintenance of the thoracic kyphosis. No malalignment. VERTEBRAE/DISCS: Mild old T11 compression fracture with less than 25% superior endplate height loss. Generalized  bright T1 and bright T2 bone marrow signal compatible with osteopenia without acute or abnormal bone marrow signal. Mild T10-11 disc height loss and desiccation compatible with degenerative disc, mild chronic discogenic endplate changes. Scattered old Schmorl's nodes. CORD: Thoracic spinal cord is normal morphology and signal characteristics to the level of the conus medullaris. PREVERTEBRAL AND PARASPINAL SOFT TISSUES:  Normal. DISC LEVELS: No significant disc bulge, canal stenosis or neural foraminal narrowing at any level. MRI LUMBAR SPINE FINDINGS SEGMENTATION:  Consistent with cervical and thoracic spine labeling. ALIGNMENT: Maintenance of the lumbar lordosis. Grade 1 L1-2 retrolisthesis and grade 1 L2-3 retrolisthesis. VERTEBRAE:Old mild-to-moderate L1 compression fracture with less than 30% superior endplate height loss. Severe L1-2 disc height loss, moderate to severe L2-3 and L3-4 with moderate subacute on chronic discogenic endplate changes. Multilevel acute on chronic Schmorl's nodes. Generalized bright T1 and bright T2 bone marrow signal compatible with osteopenia without acute or abnormal bone marrow signal. CONUS MEDULLARIS: Conus medullaris terminates at T12-L1 and demonstrates normal morphology and signal characteristics. Cauda equina is normal. PREVERTEBRAL AND PARASPINAL SOFT TISSUES: Nonacute. Cholelithiasis. Mild symmetric paraspinal muscle atrophy. DISC LEVELS: L1-2: Retrolisthesis. Small broad-based disc bulge asymmetric to LEFT. Mild canal stenosis including partial effacement LEFT lateral recess which may affect the traversing LEFT L2 nerve. Mild bilateral neural foraminal narrowing. L2-3: Retrolisthesis. Small broad-based disc bulge. Mild facet arthropathy and ligamentum flavum redundancy. Mild canal stenosis including partial effacement RIGHT greater than LEFT lateral recesses which may affect the traversing L3 nerves. Mild bilateral neural foraminal narrowing. L3-4: Small broad-based disc  bulge. Moderate to severe RIGHT and moderate LEFT facet arthropathy and ligamentum flavum redundancy resulting in moderate canal stenosis. Mild to moderate RIGHT neural foraminal narrowing. L4-5: Annular bulging. Moderate to severe facet arthropathy and ligamentum flavum redundancy resulting in moderate canal stenosis. Mild RIGHT and minimal LEFT neural foraminal narrowing. L5-S1: Small broad-based disc bulge. Severe LEFT greater than RIGHT facet arthropathy and ligamentum flavum redundancy without canal stenosis. 14 mm RIGHT facet synovial cyst extending the paraspinal soft tissues. No canal stenosis. Minimal LEFT neural foraminal narrowing. IMPRESSION: MRI CERVICAL SPINE: No MR findings of lymphoma, sensitivity decreased by patient motion and lack of contrast. Degenerative cervical spine resulting in mild canal stenosis C6-7. Multilevel neural foraminal narrowing: Likely severe on the LEFT at C3-4 though limited by motion. MRI THORACIC SPINE: No MR findings of lymphoma, sensitivity decreased by patient motion and lack of contrast. Mild old T11 compression fracture. No canal stenosis or neural foraminal narrowing. MRI LUMBAR SPINE: No MR findings of lymphoma, sensitivity decreased by lack of contrast. Mild to moderate old L1 compression fracture. Grade 1 L1-2 and L2-3 retrolisthesis. Degenerative lumbar spine resulting in moderate canal stenosis L3-4 and L4-5. Multilevel neural foraminal narrowing: Mild to moderate on the RIGHT at L3-4. Electronically Signed   By: Elon Alas M.D.   On: 12/19/2016 21:42    Medications: I have reviewed the patient's current medications.  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 Negative restaging CT evaluation 10/24/2012  Nodular  skin lesions at the right lower leg, status post a shave biopsy 07/15/2013 confirming a malignant B-cell lymphoma,  diffuse large cell positive for CD20, BCL 6, BCL 2, and CD10  Staging bone marrow biopsy 07/31/2013, negative for involvement with lymphoma  Staging PET scan 07/30/2013-negative.   Status post palliative radiation right lower leg nodular skin lesions 08/17/2013 through 09/03/2013.New nodular skin lesions at the right lower leg April 2017, status postpsies of lesions at the right lower leg 12/07/2015 confirming large B-cell lymphoma, CD20 positive bio  staging PET scan 12/26/2015-hypermetabolic cutaneous and subcutaneous nodules in the right lower extremity, no other evidence of lymphoma  Cycle 1 bendamustine/Rituxan 01/03/2016  Cycle 2 bendamustine/Rituxan 01/31/2016  Clinical progression with enlargement of multiple right lower leg skin lesions 02/23/2016  Status post radiation right lower leg 03/14/2016 through 03/29/2016  Clinical progression with new and enlarging skin lesions right lower leg 07/06/2016  PET scan 07/12/2016-clear interval increase in size and metabolic activity of a large nodule posterior right calf and a smaller subcutaneous nodule medial right thigh; several more superficial right lower extremity lesions improved with reduction in size and metabolic activity; single focus of metabolic activity associated with the medial right clavicle with bonychange  Cycle 1 CHOP/Rituxan 07/17/2016  Cycle 2 CHOP/Rituxan 08/07/2016-Neulasta added  Cycle 3 CHOP/Rituxan 08/28/2016 with Neulasta support  Restaging PET scan 09/14/2016-hypermetabolic subcutaneous nodules previously demonstrated within the right thigh and right calf have nearly resolved. There is some residual activity within the calf which could be postsurgical. Small focus of activity within the left latissimus dorsi muscle within the posterior chest wall without corresponding CT abnormality.  Cycle 4 CHOP/Rituxan 09/18/2016 (Adriamycin deleted, etoposide substituted daily 3)  Cycle 5 CHOP/rituximab with  etoposide 10/08/2016  Cycle 6 CHOP/rituximab with etoposide 10/29/2016 2. Port-A-Cath placement 06/01/2011. Port-A-Cath removal 11/04/2012 3. Hypertension. 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 5. Hypercholesterolemia. 6. Osteoarthritis. 7. Malaise-? Secondary to progression of non-Hodgkin's lymphoma. Improved. 8. Staph infection right lower leg February 2015. 9. Right calf lesion with purulent drainage 02/09/2016, culture obtained-group B strep, doxycycline prescribed 10. Port-A-Cath placement 07/13/2016 11. 2-D echo 07/10/2016-estimated ejection fraction range of 55-60% 12. 07/17/2016 hepatitis Bsurface antigen negative, core antibody positive 13. Anemia/thrombocytopenia-persistent following final chemotherapy 10/29/2016 14. Paresthesias, leg weakness,  falls-negative brain CT 12/13/2016  Noncontrast brain MRI and MRIs cervical, thoracic, and lumbar spine negative on 12/19/2016 15. Urinary tract infection   Ms. Quesnel is admitted with leg weakness and paresthesias. She has been thrive with anorexia and weight loss. 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  The persistent anemia/thrombocytopenia may be related to an effect of chemotherapy, but could also indicate lymphoma involving the bone marrow.  Recommendations: 1. Check LDH and cortisol level 2. Proceed with lumbar puncture as recommended by neurology 3. Diagnostic bone marrow biopsy next week if the remaining workup is nondiagnostic 4. Please call Oncology as needed  I appreciate the care from the medical and neurology services      LOS: 1 day   Betsy Coder, MD   12/20/2016, 1:59 PM

## 2016-12-20 NOTE — Progress Notes (Signed)
PROGRESS NOTE    Kelly Ray  WRU:[id number] DOB: Aug 31, 1940 DOA: 12/19/2016 PCP: [name], MD   Brief Narrative: 76 y.o. 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 The patient was seen at oncology clinic on the day of admission, who recommended admission in the hospital for further evaluation. Patient's oncologist is Dr. Learta Codding. Patient reported that she was treated with Bactrim for UTI recently. She still has foul-smelling urine.  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 Neurology and oncology evaluation ongoing. Plan for lumbar puncture today. Continue PT, OT therapy and supportive care.  #History of high-grade diffuse large B-cell lymphoma status post recent chemotherapy: Follow-up oncology consult service.  #Hypotension: Patient is asymptomatic. Holding Hyzaar and metoprolol. May need a beta blocker. Continue to monitor.   #Acute kidney injury likely in the setting of UTI and dehydration. Treat UTI. Start IV fluid. Monitor electrolytes including serum sodium level and potassium level.  #Mild hypo-bacteremia and hypokalemia: Continue fluid. Monitor labs.  #Urinary tract infection: Patient had recent UTI growing multiresistant Escherichia coli. Treated with bactrim. Currently UA with UTI, follow up culture results. Patient has foul smelling urine. Discussed with Dr. Johnnye Sima, staring IV meropenem for at least 5 days. Pending repeat culture result.  # Hypothyroidism: Continue Synthroid.  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 Johnnye Sima on 4/26  Procedures: None Antimicrobials: IV meropenem since 4/26  Subjective: Patient was seen and examined at bedside. Denied headache, dizziness, nausea, vomiting, chest pain, shortness of breath. Reported foul smelling urine.  Objective: Vitals:   12/20/16 0109 12/20/16 0330 12/20/16 0518 12/20/16 1100  BP: (!) 98/55 (!) 92/52 (!) 96/58 (!) 95/55  Pulse: (!) 101 99 96 (!) 106  Resp: 18 18 20 20   Temp: 97.6 F (36.4 C) 98.2 F (36.8 C) 98.7 F (37.1 C) 98.4 F (36.9 C)  TempSrc: Oral Oral Oral Oral  SpO2: 98% 99% 98% 97%  Weight:      Height:        Intake/Output Summary (Last 24 hours) at 12/20/16 1253 Last data filed at 12/20/16 0650  Gross per 24 hour  Intake                0 ml  Output              300 ml  Net             -300 ml   Filed Weights   12/19/16 1419  Weight: 76.1 kg (167 lb 11.2 oz)    Examination:  General exam: Appears calm and comfortable  Respiratory system: Clear to auscultation. Respiratory effort normal. No wheezing or crackle Cardiovascular system: S1 & S2 heard, RRR.  No pedal edema. Gastrointestinal system: Abdomen is nondistended, soft and nontender. Normal bowel sounds heard. Central nervous  system: Alert and oriented. No focal neurological deficits. Extremities: Symmetric 5 x 5 power. Skin: No rashes, lesions or ulcers Psychiatry: Judgement and insight appear normal. Mood & affect appropriate.     Data Reviewed: I have personally reviewed following labs and imaging studies  CBC:  Recent Labs Lab 12/20/16 0424  WBC 5.3  HGB 8.1*  HCT 24.7*  MCV 91.1  PLT 114*    Basic Metabolic Panel:  Recent Labs Lab 12/19/16 1658 12/20/16 0424  NA 131* 132*  K 3.4* 3.4*  CL 97* 99*  CO2 24 24  GLUCOSE 138* 99  BUN 35* 42*  CREATININE 1.96* 2.15*  CALCIUM 8.6* 8.0*   GFR: Estimated Creatinine Clearance: 22.4 mL/min (A) (by C-G formula based on SCr of 2.15 mg/dL (H)). Liver Function Tests:  Recent Labs Lab 12/19/16 1658 12/20/16 0424  AST 17 15  ALT 13* 12*  ALKPHOS 67 76  BILITOT 2.6* 2.3*  PROT 5.4* 4.9*  ALBUMIN 2.8* 2.5*   No results for input(s): LIPASE, AMYLASE in the last 168 hours. No results for input(s): AMMONIA in the last 168 hours. Coagulation Profile:  Recent Labs Lab 12/19/16 1508  INR 1.42   Cardiac Enzymes: No results for input(s): CKTOTAL, CKMB, CKMBINDEX, TROPONINI in the last 168 hours. BNP (last 3 results) No results for input(s): PROBNP in the last 8760 hours. HbA1C: No results for input(s): HGBA1C in the last 72 hours. CBG: No results for input(s): GLUCAP in the last 168 hours. Lipid Profile: No results for input(s): CHOL, HDL, LDLCALC, TRIG, CHOLHDL, LDLDIRECT in the last 72 hours. Thyroid Function Tests: No results for input(s): TSH, T4TOTAL, FREET4, T3FREE, THYROIDAB in the last 72 hours. Anemia Panel: No results for input(s): VITAMINB12, FOLATE, FERRITIN, TIBC, IRON, RETICCTPCT in the last 72 hours. Sepsis Labs: No results for input(s): PROCALCITON, LATICACIDVEN in the last 168 hours.  No results found for this or any previous visit (from the past 240 hour(s)).       Radiology Studies: Mr Brain Wo Contrast  Addendum Date: 12/19/2016   ADDENDUM REPORT: 12/19/2016 22:36 ADDENDUM: Acute findings discussed with and reconfirmed by Dr.Kirby, [facility] on 12/19/2016 at 9:44 p.m. Electronically Signed   By: Elon Alas M.D.   On: 12/19/2016 22:36   Result Date: 12/19/2016 CLINICAL DATA:  Headache today, new onset ataxia and dizziness, proximal weakness and recent fall. History of lymphoma, last  chemotherapy 2 months ago. EXAM: MRI HEAD WITHOUT CONTRAST TECHNIQUE: Multiplanar, multiecho pulse sequences of the brain and surrounding structures were obtained without intravenous contrast. GFR 24, in acute renal failure. COMPARISON:  None. FINDINGS: BRAIN: Patchy to confluent reduced diffusion in the periventricular white matter, most prominent within the splenium of the corpus callosum with intermediate to low ADC values, a amorphous bright FLAIR T2 hyperintense signal. No midline shift, mass effect or discrete masses. Ventricles and sulci are normal for patient's age. No abnormal extra-axial fluid collections. No susceptibility artifact to suggest hemorrhage. VASCULAR: Normal major intracranial vascular flow voids present at skull base. SKULL AND UPPER CERVICAL SPINE: No abnormal sellar expansion. No suspicious calvarial bone marrow signal. Craniocervical junction maintained. SINUSES/ORBITS: The mastoid air-cells and included paranasal sinuses are well-aerated. The included ocular globes and orbital contents are non-suspicious. OTHER: None. 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 Given patient's history of acute renal failure, contrast is likely contraindicated. Consider correlation with cerebral spinal fluid studies and short-term follow-up. Electronically Signed: By:  Elon Alas M.D. On: 12/19/2016 21:12   Mr Cervical Spine Wo Contrast  Result Date: 12/19/2016 CLINICAL DATA:  Headache today. New onset ataxia and dizziness, proximal weakness and recent falls. History of lymphoma, last chemotherapy 2 months ago. EXAM: MRI CERVICAL, THORACIC AND LUMBAR SPINE WITHOUT CONTRAST TECHNIQUE: Multiplanar and multiecho pulse sequences of the cervical spine, to include the craniocervical junction and cervicothoracic junction, and thoracic and lumbar spine,  were obtained without intravenous contrast. GFR 24. COMPARISON:  PET-CT October 15, 2016 FINDINGS: MRI CERVICAL SPINE FINDINGS- axial sequences are moderately motion degraded ALIGNMENT: Straightened cervical lordosis.  No malalignment. VERTEBRAE/DISCS: Vertebral bodies are intact. Moderate C5-6 and C6-7 disc height loss, with mild chronic discogenic endplate changes compatible with degenerative discs. Mild disc desiccation all cervical levels. Generalized bright T1 and bright T2 bone marrow signal compatible with osteopenia. No suspicious or acute bone marrow signal. CORD:Cervical spinal cord is normal morphology and signal characteristics from the cervicomedullary junction to level of T1-2, the most caudal well visualized level. POSTERIOR FOSSA, VERTEBRAL ARTERIES, PARASPINAL TISSUES: No MR findings of ligamentous injury. Vertebral artery flow voids present. Included posterior fossa and paraspinal soft tissues are normal. DISC LEVELS: C2-3:  No disc bulge, canal stenosis nor neural foraminal narrowing. C3-4: Small broad-based disc bulge LEFT subarticular disc protrusion suspected. Uncovertebral hypertrophy. Mild RIGHT, severe LEFT facet arthropathy. No canal stenosis. Severe suspected LEFT neural foraminal narrowing, mild on the RIGHT. C4-5: No disc bulge. Uncovertebral hypertrophy. Mild RIGHT, moderate LEFT facet arthropathy without canal stenosis. Mild probable LEFT neural foraminal narrowing. C5-6: Small broad-based disc bulge uncovertebral hypertrophy without canal stenosis. Mild to moderate probable neural foraminal narrowing. C6-7: Small broad-based disc bulge asymmetric to the RIGHT. Uncovertebral hypertrophy. Mild canal stenosis. At least mild RIGHT neural foraminal narrowing. C7-T1: No disc bulge, canal stenosis. Mild facet arthropathy. Mild RIGHT, mild to moderate LEFT probable neural foraminal narrowing. MRI THORACIC SPINE FINDINGS-- mildly motion degraded examination. ALIGNMENT: Maintenance of the  thoracic kyphosis. No malalignment. VERTEBRAE/DISCS: Mild old T11 compression fracture with less than 25% superior endplate height loss. Generalized bright T1 and bright T2 bone marrow signal compatible with osteopenia without acute or abnormal bone marrow signal. Mild T10-11 disc height loss and desiccation compatible with degenerative disc, mild chronic discogenic endplate changes. Scattered old Schmorl's nodes. CORD: Thoracic spinal cord is normal morphology and signal characteristics to the level of the conus medullaris. PREVERTEBRAL AND PARASPINAL SOFT TISSUES:  Normal. DISC LEVELS: No significant disc bulge, canal stenosis or neural foraminal narrowing at any level. MRI LUMBAR SPINE FINDINGS SEGMENTATION:  Consistent with cervical and thoracic spine labeling. ALIGNMENT: Maintenance of the lumbar lordosis. Grade 1 L1-2 retrolisthesis and grade 1 L2-3 retrolisthesis. VERTEBRAE:Old mild-to-moderate L1 compression fracture with less than 30% superior endplate height loss. Severe L1-2 disc height loss, moderate to severe L2-3 and L3-4 with moderate subacute on chronic discogenic endplate changes. Multilevel acute on chronic Schmorl's nodes. Generalized bright T1 and bright T2 bone marrow signal compatible with osteopenia without acute or abnormal bone marrow signal. CONUS MEDULLARIS: Conus medullaris terminates at T12-L1 and demonstrates normal morphology and signal characteristics. Cauda equina is normal. PREVERTEBRAL AND PARASPINAL SOFT TISSUES: Nonacute. Cholelithiasis. Mild symmetric paraspinal muscle atrophy. DISC LEVELS: L1-2: Retrolisthesis. Small broad-based disc bulge asymmetric to LEFT. Mild canal stenosis including partial effacement LEFT lateral recess which may affect the traversing LEFT L2 nerve. Mild bilateral neural foraminal narrowing. L2-3: Retrolisthesis. Small broad-based disc bulge. Mild facet arthropathy and ligamentum flavum redundancy. Mild canal stenosis including partial effacement RIGHT  greater than LEFT lateral recesses which may affect the traversing L3 nerves. Mild bilateral neural foraminal narrowing. L3-4: Small broad-based disc bulge. Moderate to severe RIGHT and moderate LEFT facet arthropathy and ligamentum flavum redundancy resulting in moderate canal stenosis. Mild to moderate RIGHT neural foraminal narrowing. L4-5: Annular bulging. Moderate to severe facet arthropathy and ligamentum flavum redundancy resulting in moderate canal stenosis. Mild RIGHT and minimal LEFT neural foraminal narrowing. L5-S1: Small broad-based disc bulge. Severe LEFT greater than RIGHT facet arthropathy and ligamentum flavum redundancy without canal stenosis. 14 mm RIGHT facet synovial cyst extending the paraspinal soft tissues. No canal stenosis. Minimal LEFT neural foraminal narrowing. IMPRESSION: MRI CERVICAL SPINE: No MR findings of lymphoma, sensitivity decreased by patient motion and lack of contrast. Degenerative cervical spine resulting in mild canal stenosis C6-7. Multilevel neural foraminal narrowing: Likely severe on the LEFT at C3-4 though limited by motion. MRI THORACIC SPINE: No MR findings of lymphoma, sensitivity decreased by patient motion and lack of contrast. Mild old T11 compression fracture. No canal stenosis or neural foraminal narrowing. MRI LUMBAR SPINE: No MR findings of lymphoma, sensitivity decreased by lack of contrast. Mild to moderate old L1 compression fracture. Grade 1 L1-2 and L2-3 retrolisthesis. Degenerative lumbar spine resulting in moderate canal stenosis L3-4 and L4-5. Multilevel neural foraminal narrowing: Mild to moderate on the RIGHT at L3-4. Electronically Signed   By: Elon Alas M.D.   On: 12/19/2016 21:42   Mr Thoracic Spine Wo Contrast  Result Date: 12/19/2016 CLINICAL DATA:  Headache today. New onset ataxia and dizziness, proximal weakness and recent falls. History of lymphoma, last chemotherapy 2 months ago. EXAM: MRI CERVICAL, THORACIC AND LUMBAR SPINE  WITHOUT CONTRAST TECHNIQUE: Multiplanar and multiecho pulse sequences of the cervical spine, to include the craniocervical junction and cervicothoracic junction, and thoracic and lumbar spine, were obtained without intravenous contrast. GFR 24. COMPARISON:  PET-CT October 15, 2016 FINDINGS: MRI CERVICAL SPINE FINDINGS- axial sequences are moderately motion degraded ALIGNMENT: Straightened cervical lordosis.  No malalignment. VERTEBRAE/DISCS: Vertebral bodies are intact. Moderate C5-6 and C6-7 disc height loss, with mild chronic discogenic endplate changes compatible with degenerative discs. Mild disc desiccation all cervical levels. Generalized bright T1 and bright T2 bone marrow signal compatible with osteopenia. No suspicious or acute bone marrow signal. CORD:Cervical spinal cord is normal morphology and signal characteristics from the cervicomedullary junction to level of T1-2, the most caudal well visualized level. POSTERIOR FOSSA, VERTEBRAL ARTERIES, PARASPINAL TISSUES: No MR findings of ligamentous injury. Vertebral artery flow voids present. Included posterior fossa and paraspinal soft tissues are normal. DISC LEVELS: C2-3:  No disc bulge, canal stenosis nor neural foraminal narrowing. C3-4: Small broad-based disc bulge LEFT subarticular disc protrusion suspected. Uncovertebral hypertrophy. Mild RIGHT, severe LEFT facet arthropathy. No canal stenosis. Severe suspected LEFT neural foraminal narrowing, mild on the RIGHT. C4-5: No disc bulge. Uncovertebral hypertrophy. Mild RIGHT, moderate LEFT facet arthropathy without canal stenosis. Mild probable LEFT neural foraminal narrowing. C5-6: Small broad-based disc bulge uncovertebral hypertrophy without canal stenosis. Mild to moderate probable neural foraminal narrowing. C6-7: Small broad-based disc bulge asymmetric to the RIGHT. Uncovertebral hypertrophy. Mild canal stenosis. At least mild RIGHT neural foraminal narrowing. C7-T1: No disc bulge, canal stenosis.  Mild facet arthropathy. Mild RIGHT, mild to moderate LEFT probable neural foraminal narrowing. MRI THORACIC SPINE FINDINGS-- mildly motion degraded examination. ALIGNMENT: Maintenance of the thoracic kyphosis. No malalignment. VERTEBRAE/DISCS: Mild old T11 compression fracture with less than 25% superior endplate height loss. Generalized bright T1 and bright T2 bone marrow  signal compatible with osteopenia without acute or abnormal bone marrow signal. Mild T10-11 disc height loss and desiccation compatible with degenerative disc, mild chronic discogenic endplate changes. Scattered old Schmorl's nodes. CORD: Thoracic spinal cord is normal morphology and signal characteristics to the level of the conus medullaris. PREVERTEBRAL AND PARASPINAL SOFT TISSUES:  Normal. DISC LEVELS: No significant disc bulge, canal stenosis or neural foraminal narrowing at any level. MRI LUMBAR SPINE FINDINGS SEGMENTATION:  Consistent with cervical and thoracic spine labeling. ALIGNMENT: Maintenance of the lumbar lordosis. Grade 1 L1-2 retrolisthesis and grade 1 L2-3 retrolisthesis. VERTEBRAE:Old mild-to-moderate L1 compression fracture with less than 30% superior endplate height loss. Severe L1-2 disc height loss, moderate to severe L2-3 and L3-4 with moderate subacute on chronic discogenic endplate changes. Multilevel acute on chronic Schmorl's nodes. Generalized bright T1 and bright T2 bone marrow signal compatible with osteopenia without acute or abnormal bone marrow signal. CONUS MEDULLARIS: Conus medullaris terminates at T12-L1 and demonstrates normal morphology and signal characteristics. Cauda equina is normal. PREVERTEBRAL AND PARASPINAL SOFT TISSUES: Nonacute. Cholelithiasis. Mild symmetric paraspinal muscle atrophy. DISC LEVELS: L1-2: Retrolisthesis. Small broad-based disc bulge asymmetric to LEFT. Mild canal stenosis including partial effacement LEFT lateral recess which may affect the traversing LEFT L2 nerve. Mild bilateral  neural foraminal narrowing. L2-3: Retrolisthesis. Small broad-based disc bulge. Mild facet arthropathy and ligamentum flavum redundancy. Mild canal stenosis including partial effacement RIGHT greater than LEFT lateral recesses which may affect the traversing L3 nerves. Mild bilateral neural foraminal narrowing. L3-4: Small broad-based disc bulge. Moderate to severe RIGHT and moderate LEFT facet arthropathy and ligamentum flavum redundancy resulting in moderate canal stenosis. Mild to moderate RIGHT neural foraminal narrowing. L4-5: Annular bulging. Moderate to severe facet arthropathy and ligamentum flavum redundancy resulting in moderate canal stenosis. Mild RIGHT and minimal LEFT neural foraminal narrowing. L5-S1: Small broad-based disc bulge. Severe LEFT greater than RIGHT facet arthropathy and ligamentum flavum redundancy without canal stenosis. 14 mm RIGHT facet synovial cyst extending the paraspinal soft tissues. No canal stenosis. Minimal LEFT neural foraminal narrowing. IMPRESSION: MRI CERVICAL SPINE: No MR findings of lymphoma, sensitivity decreased by patient motion and lack of contrast. Degenerative cervical spine resulting in mild canal stenosis C6-7. Multilevel neural foraminal narrowing: Likely severe on the LEFT at C3-4 though limited by motion. MRI THORACIC SPINE: No MR findings of lymphoma, sensitivity decreased by patient motion and lack of contrast. Mild old T11 compression fracture. No canal stenosis or neural foraminal narrowing. MRI LUMBAR SPINE: No MR findings of lymphoma, sensitivity decreased by lack of contrast. Mild to moderate old L1 compression fracture. Grade 1 L1-2 and L2-3 retrolisthesis. Degenerative lumbar spine resulting in moderate canal stenosis L3-4 and L4-5. Multilevel neural foraminal narrowing: Mild to moderate on the RIGHT at L3-4. Electronically Signed   By: Elon Alas M.D.   On: 12/19/2016 21:42   Mr Lumbar Spine Wo Contrast  Result Date: 12/19/2016 CLINICAL  DATA:  Headache today. New onset ataxia and dizziness, proximal weakness and recent falls. History of lymphoma, last chemotherapy 2 months ago. EXAM: MRI CERVICAL, THORACIC AND LUMBAR SPINE WITHOUT CONTRAST TECHNIQUE: Multiplanar and multiecho pulse sequences of the cervical spine, to include the craniocervical junction and cervicothoracic junction, and thoracic and lumbar spine, were obtained without intravenous contrast. GFR 24. COMPARISON:  PET-CT October 15, 2016 FINDINGS: MRI CERVICAL SPINE FINDINGS- axial sequences are moderately motion degraded ALIGNMENT: Straightened cervical lordosis.  No malalignment. VERTEBRAE/DISCS: Vertebral bodies are intact. Moderate C5-6 and C6-7 disc height loss, with mild chronic discogenic endplate changes  compatible with degenerative discs. Mild disc desiccation all cervical levels. Generalized bright T1 and bright T2 bone marrow signal compatible with osteopenia. No suspicious or acute bone marrow signal. CORD:Cervical spinal cord is normal morphology and signal characteristics from the cervicomedullary junction to level of T1-2, the most caudal well visualized level. POSTERIOR FOSSA, VERTEBRAL ARTERIES, PARASPINAL TISSUES: No MR findings of ligamentous injury. Vertebral artery flow voids present. Included posterior fossa and paraspinal soft tissues are normal. DISC LEVELS: C2-3:  No disc bulge, canal stenosis nor neural foraminal narrowing. C3-4: Small broad-based disc bulge LEFT subarticular disc protrusion suspected. Uncovertebral hypertrophy. Mild RIGHT, severe LEFT facet arthropathy. No canal stenosis. Severe suspected LEFT neural foraminal narrowing, mild on the RIGHT. C4-5: No disc bulge. Uncovertebral hypertrophy. Mild RIGHT, moderate LEFT facet arthropathy without canal stenosis. Mild probable LEFT neural foraminal narrowing. C5-6: Small broad-based disc bulge uncovertebral hypertrophy without canal stenosis. Mild to moderate probable neural foraminal narrowing. C6-7:  Small broad-based disc bulge asymmetric to the RIGHT. Uncovertebral hypertrophy. Mild canal stenosis. At least mild RIGHT neural foraminal narrowing. C7-T1: No disc bulge, canal stenosis. Mild facet arthropathy. Mild RIGHT, mild to moderate LEFT probable neural foraminal narrowing. MRI THORACIC SPINE FINDINGS-- mildly motion degraded examination. ALIGNMENT: Maintenance of the thoracic kyphosis. No malalignment. VERTEBRAE/DISCS: Mild old T11 compression fracture with less than 25% superior endplate height loss. Generalized bright T1 and bright T2 bone marrow signal compatible with osteopenia without acute or abnormal bone marrow signal. Mild T10-11 disc height loss and desiccation compatible with degenerative disc, mild chronic discogenic endplate changes. Scattered old Schmorl's nodes. CORD: Thoracic spinal cord is normal morphology and signal characteristics to the level of the conus medullaris. PREVERTEBRAL AND PARASPINAL SOFT TISSUES:  Normal. DISC LEVELS: No significant disc bulge, canal stenosis or neural foraminal narrowing at any level. MRI LUMBAR SPINE FINDINGS SEGMENTATION:  Consistent with cervical and thoracic spine labeling. ALIGNMENT: Maintenance of the lumbar lordosis. Grade 1 L1-2 retrolisthesis and grade 1 L2-3 retrolisthesis. VERTEBRAE:Old mild-to-moderate L1 compression fracture with less than 30% superior endplate height loss. Severe L1-2 disc height loss, moderate to severe L2-3 and L3-4 with moderate subacute on chronic discogenic endplate changes. Multilevel acute on chronic Schmorl's nodes. Generalized bright T1 and bright T2 bone marrow signal compatible with osteopenia without acute or abnormal bone marrow signal. CONUS MEDULLARIS: Conus medullaris terminates at T12-L1 and demonstrates normal morphology and signal characteristics. Cauda equina is normal. PREVERTEBRAL AND PARASPINAL SOFT TISSUES: Nonacute. Cholelithiasis. Mild symmetric paraspinal muscle atrophy. DISC LEVELS: L1-2:  Retrolisthesis. Small broad-based disc bulge asymmetric to LEFT. Mild canal stenosis including partial effacement LEFT lateral recess which may affect the traversing LEFT L2 nerve. Mild bilateral neural foraminal narrowing. L2-3: Retrolisthesis. Small broad-based disc bulge. Mild facet arthropathy and ligamentum flavum redundancy. Mild canal stenosis including partial effacement RIGHT greater than LEFT lateral recesses which may affect the traversing L3 nerves. Mild bilateral neural foraminal narrowing. L3-4: Small broad-based disc bulge. Moderate to severe RIGHT and moderate LEFT facet arthropathy and ligamentum flavum redundancy resulting in moderate canal stenosis. Mild to moderate RIGHT neural foraminal narrowing. L4-5: Annular bulging. Moderate to severe facet arthropathy and ligamentum flavum redundancy resulting in moderate canal stenosis. Mild RIGHT and minimal LEFT neural foraminal narrowing. L5-S1: Small broad-based disc bulge. Severe LEFT greater than RIGHT facet arthropathy and ligamentum flavum redundancy without canal stenosis. 14 mm RIGHT facet synovial cyst extending the paraspinal soft tissues. No canal stenosis. Minimal LEFT neural foraminal narrowing. IMPRESSION: MRI CERVICAL SPINE: No MR findings of lymphoma, sensitivity decreased by  patient motion and lack of contrast. Degenerative cervical spine resulting in mild canal stenosis C6-7. Multilevel neural foraminal narrowing: Likely severe on the LEFT at C3-4 though limited by motion. MRI THORACIC SPINE: No MR findings of lymphoma, sensitivity decreased by patient motion and lack of contrast. Mild old T11 compression fracture. No canal stenosis or neural foraminal narrowing. MRI LUMBAR SPINE: No MR findings of lymphoma, sensitivity decreased by lack of contrast. Mild to moderate old L1 compression fracture. Grade 1 L1-2 and L2-3 retrolisthesis. Degenerative lumbar spine resulting in moderate canal stenosis L3-4 and L4-5. Multilevel neural foraminal  narrowing: Mild to moderate on the RIGHT at L3-4. Electronically Signed   By: Elon Alas M.D.   On: 12/19/2016 21:42        Scheduled Meds: . enoxaparin (LOVENOX) injection  30 mg Subcutaneous Q24H  . levothyroxine  112 mcg Oral QAC breakfast  . potassium chloride SA  20 mEq Oral Daily  . pravastatin  40 mg Oral QHS   Continuous Infusions:   LOS: 1 day    Dron Tanna Furry, MD [facility] Pager [phone]  If 7PM-7AM, please contact night-coverage [email or web address] Password [facility] 12/20/2016, 12:53 PM

## 2016-12-20 NOTE — Progress Notes (Signed)
Pharmacy Antibiotic Note  Kelly Ray is a 76 y.o. female admitted on 12/19/2016 with UTI.  Pharmacy has been consulted for Meropenem dosing.  Pt with urinary symptoms, per 4/25 progress note,  and recent treatment for MDR [facility] UTI with Septra.  DrBhandari had discussed with ID and to start Meropenem.  Plan: Meropenem 500mg  IV q12 Trend clinical status, cx data Watch renal function  Height: 5\' 3"  (160 cm) Weight: 167 lb 11.2 oz (76.1 kg) (Simultaneous filing. User may not have seen previous data.) IBW/kg (Calculated) : 52.4  Temp (24hrs), Avg:98 F (36.7 C), Min:97.5 F (36.4 C), Max:98.7 F (37.1 C)   Recent Labs Lab 12/19/16 1658 12/20/16 0424  WBC  --  5.3  CREATININE 1.96* 2.15*    Estimated Creatinine Clearance: 22.4 mL/min (A) (by C-G formula based on SCr of 2.15 mg/dL (H)).    Allergies  Allergen Reactions  . Lisinopril Cough    Antimicrobials this admission: Meropenem 4/26 >>  Microbiology results: 4/2 UCx: EColi.  S- Imipenem, Septra, Nitrofurantoin  Thank you for allowing pharmacy to be a part of this patient's care.  Lewie Chamber., PharmD Clinical Pharmacist [facility]

## 2016-12-20 NOTE — Progress Notes (Signed)
Patient screened. No new cognitive-linguistic impairments noted with this admission. Defer formal evaluation.  [place], [place] [phone]

## 2016-12-20 NOTE — Procedures (Signed)
Indication: Cytology  Risks of the procedure were dicussed with the patient including post-LP headache, bleeding, infection, weakness/numbness of legs(radiculopathy), death.  The patient agreed and written consent was obtained.   The patient was prepped and draped, and using sterile technique a 20 gauge quinke spinal needle was inserted in the L 4/5 space. Marland Kitchen Approximately 12 cc of CSF were obtained and sent for analysis. No complications were encountered.   Etta Quill PA-C [facility] [phone]  M-F  (8:30 am- 4 PM)  12/20/2016, 10:43 AM

## 2016-12-20 NOTE — Care Management Note (Signed)
Case Management Note  Patient Details  Name: Kelly Ray MRN: [id number] Date of Birth: 1941-06-29  Subjective/Objective:     Pt admitted with diffuse large-B cell lymphoma. She is from home with her spouse.                Action/Plan: Awaiting PT/OT recommendations. CM following for d/c needs, physician orders.   Expected Discharge Date:                  Expected Discharge Plan:     In-House Referral:     Discharge planning Services     Post Acute Care Choice:    Choice offered to:     DME Arranged:    DME Agency:     HH Arranged:    HH Agency:     Status of Service:  In process, will continue to follow  If discussed at Long Length of Stay Meetings, dates discussed:    Additional Comments:  Pollie Friar, RN 12/20/2016, 1:55 PM

## 2016-12-20 NOTE — Progress Notes (Signed)
OT Cancellation Note  Patient Details Name: Kelly Ray MRN: [id number] DOB: 1941-08-27   Cancelled Treatment:    Reason Eval/Treat Not Completed: Patient at procedure or test/ unavailable (currently receiving LP). Will follow up as time allows and pt is appropriate.   Binnie Kand M.S., OTR/L Pager: [phone] 12/20/2016, 10:24 AM

## 2016-12-20 NOTE — Progress Notes (Signed)
PT Cancellation Note  Patient Details Name: Kelly Ray MRN: [id number] DOB: 1940/09/15   Cancelled Treatment:    Reason Eval/Treat Not Completed: Patient at procedure or test/unavailable. Pt currently having an LP. PT will continue to f/u with pt as available.   Clearnce Sorrel Deshawn Witty 12/20/2016, 10:29 AM

## 2016-12-20 NOTE — Progress Notes (Signed)
Initial Nutrition Assessment  DOCUMENTATION CODES:   Not applicable  INTERVENTION:    Ensure Enlive po BID, each supplement provides 350 kcal and 20 grams of protein  NUTRITION DIAGNOSIS:   Increased nutrient needs related to catabolic illness as evidenced by estimated needs  GOAL:   Patient will meet greater than or equal to 90% of their needs  MONITOR:   PO intake, Supplement acceptance, Labs, Weight trends, I & O's  REASON FOR ASSESSMENT:   Consult Assessment of nutrition requirement/status  ASSESSMENT:   76 y.o. Female with PMH significant for Diffuse large B cell lymphoma, last chemotherapy 2 months ago, which appeared to be on remission, presenting with new development of ataxia, dizziness, as well as pronounced proximal weakness with recent fall and failure to thrive. The patient was seen at oncology clinic on the day of admission, who recommended admission in the hospital for further evaluation.   Pt s/p procedure 4/26: LUMBAR PUNCTURE   RD spoke with patient at bedside. Reports a decreased appetite.  Was consuming 2 meals per day. Also endorses a 51 lb weight loss in the last year.  Severe for time frame. States she was drinking Ensure oral nutrition supplements at home. Labs reviewed.  Sodium 132 (L).  Potassium 3.4 (L).  Unable to complete Nutrition-Focused physical exam at this time.  Suspect malnutrition, however, unable to identify at this time.  RD briefly spoke with pt's husband outside of pt's room (in hallway) this AM. He reported pt is not eating and continuing to lose weight.  Diet Order:  Diet Heart Room service appropriate? Yes; Fluid consistency: Thin  Skin:  Reviewed, no issues  Last BM:  4/25  Height:   Ht Readings from Last 1 Encounters:  12/19/16 5\' 3"  (1.6 m)   Weight:   Wt Readings from Last 1 Encounters:  12/19/16 167 lb 11.2 oz (76.1 kg)   Wt Readings from Last 15 Encounters:  12/19/16 167 lb 11.2 oz (76.1 kg)  12/12/16  171 lb 9.6 oz (77.8 kg)  11/26/16 177 lb 12.8 oz (80.6 kg)  11/09/16 175 lb 4.8 oz (79.5 kg)  11/07/16 176 lb (79.8 kg)  10/29/16 182 lb (82.6 kg)  10/08/16 182 lb (82.6 kg)  09/18/16 184 lb (83.5 kg)  08/28/16 185 lb 3.2 oz (84 kg)  08/07/16 187 lb (84.8 kg)  07/27/16 191 lb 11.2 oz (87 kg)  07/17/16 193 lb 9.6 oz (87.8 kg)  07/13/16 191 lb (86.6 kg)  07/06/16 191 lb 3.7 oz (86.7 kg)  06/11/16 192 lb 12.8 oz (87.5 kg)   Ideal Body Weight:  52.2 kg  BMI:  Body mass index is 29.71 kg/m.  Estimated Nutritional Needs:   Kcal:  1900-2100  Protein:  90-100 gm  Fluid:  1.9-2.1 L  EDUCATION NEEDS:   No education needs identified at this time  Arthur Holms, RD, LDN Pager #: [phone] After-Hours Pager #: [phone]

## 2016-12-21 ENCOUNTER — Telehealth: Payer: Self-pay | Admitting: Oncology

## 2016-12-21 ENCOUNTER — Inpatient Hospital Stay (HOSPITAL_COMMUNITY): Payer: Medicare Other

## 2016-12-21 DIAGNOSIS — N179 Acute kidney failure, unspecified: Secondary | ICD-10-CM

## 2016-12-21 DIAGNOSIS — N19 Unspecified kidney failure: Secondary | ICD-10-CM

## 2016-12-21 LAB — CBC
HCT: 25.5 % — ABNORMAL LOW (ref 36.0–46.0)
Hemoglobin: 8.6 g/dL — ABNORMAL LOW (ref 12.0–15.0)
MCH: 30.8 pg (ref 26.0–34.0)
MCHC: 33.7 g/dL (ref 30.0–36.0)
MCV: 91.4 fL (ref 78.0–100.0)
Platelets: 81 10*3/uL — ABNORMAL LOW (ref 150–400)
RBC: 2.79 MIL/uL — ABNORMAL LOW (ref 3.87–5.11)
RDW: 16.2 % — AB (ref 11.5–15.5)
WBC: 5.3 10*3/uL (ref 4.0–10.5)

## 2016-12-21 LAB — SAVE SMEAR

## 2016-12-21 LAB — BASIC METABOLIC PANEL
Anion gap: 8 (ref 5–15)
BUN: 50 mg/dL — AB (ref 6–20)
CALCIUM: 8.1 mg/dL — AB (ref 8.9–10.3)
CO2: 23 mmol/L (ref 22–32)
Chloride: 102 mmol/L (ref 101–111)
Creatinine, Ser: 2.29 mg/dL — ABNORMAL HIGH (ref 0.44–1.00)
GFR calc Af Amer: 23 mL/min — ABNORMAL LOW (ref >60)
GFR calc non Af Amer: 20 mL/min — ABNORMAL LOW (ref >60)
GLUCOSE: 101 mg/dL — AB (ref 65–99)
POTASSIUM: 4.2 mmol/L (ref 3.5–5.1)
Sodium: 133 mmol/L — ABNORMAL LOW (ref 135–145)

## 2016-12-21 LAB — NA AND K (SODIUM & POTASSIUM), RAND UR
Potassium Urine: 43 mmol/L
SODIUM UR: 43 mmol/L

## 2016-12-21 LAB — HEPATIC FUNCTION PANEL
ALK PHOS: 202 U/L — AB (ref 38–126)
ALT: 15 U/L (ref 14–54)
AST: 25 U/L (ref 15–41)
Albumin: 2 g/dL — ABNORMAL LOW (ref 3.5–5.0)
BILIRUBIN INDIRECT: 0.8 mg/dL (ref 0.3–0.9)
BILIRUBIN TOTAL: 2.7 mg/dL — AB (ref 0.3–1.2)
Bilirubin, Direct: 1.9 mg/dL — ABNORMAL HIGH (ref 0.1–0.5)
Total Protein: 4.4 g/dL — ABNORMAL LOW (ref 6.5–8.1)

## 2016-12-21 LAB — LACTATE DEHYDROGENASE: LDH: 149 U/L (ref 98–192)

## 2016-12-21 LAB — RETICULOCYTES
RBC.: 2.79 MIL/uL — ABNORMAL LOW (ref 3.87–5.11)
Retic Count, Absolute: 36.3 10*3/uL (ref 19.0–186.0)
Retic Ct Pct: 1.3 % (ref 0.4–3.1)

## 2016-12-21 LAB — OSMOLALITY, URINE: Osmolality, Ur: 339 mOsm/kg (ref 300–900)

## 2016-12-21 MED ORDER — METOPROLOL TARTRATE 25 MG PO TABS
25.0000 mg | ORAL_TABLET | Freq: Two times a day (BID) | ORAL | Status: DC
  Administered 2016-12-21 – 2016-12-23 (×4): 25 mg via ORAL
  Filled 2016-12-21 (×4): qty 1

## 2016-12-21 NOTE — Progress Notes (Addendum)
IP PROGRESS NOTE  Subjective:   She reports improvement in the leg strength and foot numbness. She has ambulated in the hall. She is getting up to the bedside commode.  Objective: Vital signs in last 24 hours: Blood pressure 103/63, pulse (!) 104, temperature 97.9 F (36.6 C), temperature source Oral, resp. rate 20, height _0  (1.6 m), weight 167 lb 11.2 oz (76.1 kg), SpO2 98 %.  Intake/Output from previous day: 04/26 0701 - 04/27 0700 In: 1610 [P.O.:240; I.V.:835; IV Piggyback:500] Out: 350 [Urine:350]  Physical Exam: HEENT: No thrush Abdomen: No hepatosplenomegaly, nontender Extremities: No leg edema Neurologic: Foot strength appears intact bilaterally, mild weakness with flexion at the hips, she was able to ambulate to the bedside chair  Portacath/PICC-without erythema  Lab Results:  Recent Labs  12/20/16 0424 12/21/16 0433  WBC 5.3 5.3  HGB 8.1* 8.6*  HCT 24.7* 25.5*  PLT 114* 81*    BMET  Recent Labs  12/20/16 0424 12/21/16 0433  NA 132* 133*  K 3.4* 4.2  CL 99* 102  CO2 24 23  GLUCOSE 99 101*  BUN 42* 50*  CREATININE 2.15* 2.29*  CALCIUM 8.0* 8.1*   12/20/2016: LDH 157, cortisol 22 Blood smear 12/21/2016: There are numerous atypical appearing monocytes, toxic granulations in the neutrophils, the platelets are mildly decreased in number. There are a few ovalocytes, burr cells, and teardrops. Acanthocytes are present. No schistocytes. The polychromasia is not increased. Studies/Results: Mr Brain Wo Contrast  Addendum Date: 12/19/2016   ADDENDUM REPORT: 12/19/2016 22:36 ADDENDUM: Acute findings discussed with and reconfirmed by Dr.Kirby, [facility] on 12/19/2016 at 9:44 p.m. Electronically Signed   By: Elon Alas M.D.   On: 12/19/2016 22:36   Result Date: 12/19/2016 CLINICAL DATA:  Headache today, new onset ataxia and dizziness, proximal weakness and recent fall. History of lymphoma, last chemotherapy 2 months ago. EXAM: MRI HEAD WITHOUT CONTRAST  TECHNIQUE: Multiplanar, multiecho pulse sequences of the brain and surrounding structures were obtained without intravenous contrast. GFR 24, in acute renal failure. COMPARISON:  None. FINDINGS: BRAIN: Patchy to confluent reduced diffusion in the periventricular white matter, most prominent within the splenium of the corpus callosum with intermediate to low ADC values, a amorphous bright FLAIR T2 hyperintense signal. No midline shift, mass effect or discrete masses. Ventricles and sulci are normal for patient's age. No abnormal extra-axial fluid collections. No susceptibility artifact to suggest hemorrhage. VASCULAR: Normal major intracranial vascular flow voids present at skull base. SKULL AND UPPER CERVICAL SPINE: No abnormal sellar expansion. No suspicious calvarial bone marrow signal. Craniocervical junction maintained. SINUSES/ORBITS: The mastoid air-cells and included paranasal sinuses are well-aerated. The included ocular globes and orbital contents are non-suspicious. OTHER: None. 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 Given patient's history of acute renal failure, contrast is likely contraindicated. Consider correlation with cerebral spinal fluid studies and short-term follow-up. Electronically Signed: By: Elon Alas M.D. On: 12/19/2016 21:12   Mr Cervical Spine Wo Contrast  Result Date: 12/19/2016 CLINICAL DATA:  Headache today. New onset ataxia and dizziness, proximal weakness and recent falls. History of lymphoma, last chemotherapy 2 months ago. EXAM: MRI CERVICAL, THORACIC AND LUMBAR SPINE WITHOUT CONTRAST TECHNIQUE: Multiplanar and multiecho pulse sequences of the cervical spine, to include the craniocervical junction and cervicothoracic junction, and thoracic and lumbar spine, were obtained without intravenous contrast. GFR 24.  COMPARISON:  PET-CT October 15, 2016 FINDINGS: MRI CERVICAL SPINE FINDINGS- axial sequences  are moderately motion degraded ALIGNMENT: Straightened cervical lordosis.  No malalignment. VERTEBRAE/DISCS: Vertebral bodies are intact. Moderate C5-6 and C6-7 disc height loss, with mild chronic discogenic endplate changes compatible with degenerative discs. Mild disc desiccation all cervical levels. Generalized bright T1 and bright T2 bone marrow signal compatible with osteopenia. No suspicious or acute bone marrow signal. CORD:Cervical spinal cord is normal morphology and signal characteristics from the cervicomedullary junction to level of T1-2, the most caudal well visualized level. POSTERIOR FOSSA, VERTEBRAL ARTERIES, PARASPINAL TISSUES: No MR findings of ligamentous injury. Vertebral artery flow voids present. Included posterior fossa and paraspinal soft tissues are normal. DISC LEVELS: C2-3:  No disc bulge, canal stenosis nor neural foraminal narrowing. C3-4: Small broad-based disc bulge LEFT subarticular disc protrusion suspected. Uncovertebral hypertrophy. Mild RIGHT, severe LEFT facet arthropathy. No canal stenosis. Severe suspected LEFT neural foraminal narrowing, mild on the RIGHT. C4-5: No disc bulge. Uncovertebral hypertrophy. Mild RIGHT, moderate LEFT facet arthropathy without canal stenosis. Mild probable LEFT neural foraminal narrowing. C5-6: Small broad-based disc bulge uncovertebral hypertrophy without canal stenosis. Mild to moderate probable neural foraminal narrowing. C6-7: Small broad-based disc bulge asymmetric to the RIGHT. Uncovertebral hypertrophy. Mild canal stenosis. At least mild RIGHT neural foraminal narrowing. C7-T1: No disc bulge, canal stenosis. Mild facet arthropathy. Mild RIGHT, mild to moderate LEFT probable neural foraminal narrowing. MRI THORACIC SPINE FINDINGS-- mildly motion degraded examination. ALIGNMENT: Maintenance of the thoracic kyphosis. No malalignment. VERTEBRAE/DISCS:  Mild old T11 compression fracture with less than 25% superior endplate height loss. Generalized bright T1 and bright T2 bone marrow signal compatible with osteopenia without acute or abnormal bone marrow signal. Mild T10-11 disc height loss and desiccation compatible with degenerative disc, mild chronic discogenic endplate changes. Scattered old Schmorl's nodes. CORD: Thoracic spinal cord is normal morphology and signal characteristics to the level of the conus medullaris. PREVERTEBRAL AND PARASPINAL SOFT TISSUES:  Normal. DISC LEVELS: No significant disc bulge, canal stenosis or neural foraminal narrowing at any level. MRI LUMBAR SPINE FINDINGS SEGMENTATION:  Consistent with cervical and thoracic spine labeling. ALIGNMENT: Maintenance of the lumbar lordosis. Grade 1 L1-2 retrolisthesis and grade 1 L2-3 retrolisthesis. VERTEBRAE:Old mild-to-moderate L1 compression fracture with less than 30% superior endplate height loss. Severe L1-2 disc height loss, moderate to severe L2-3 and L3-4 with moderate subacute on chronic discogenic endplate changes. Multilevel acute on chronic Schmorl's nodes. Generalized bright T1 and bright T2 bone marrow signal compatible with osteopenia without acute or abnormal bone marrow signal. CONUS MEDULLARIS: Conus medullaris terminates at T12-L1 and demonstrates normal morphology and signal characteristics. Cauda equina is normal. PREVERTEBRAL AND PARASPINAL SOFT TISSUES: Nonacute. Cholelithiasis. Mild symmetric paraspinal muscle atrophy. DISC LEVELS: L1-2: Retrolisthesis. Small broad-based disc bulge asymmetric to LEFT. Mild canal stenosis including partial effacement LEFT lateral recess which may affect the traversing LEFT L2 nerve. Mild bilateral neural foraminal narrowing. L2-3: Retrolisthesis. Small broad-based disc bulge. Mild facet arthropathy and ligamentum flavum redundancy. Mild canal stenosis including partial effacement RIGHT greater than LEFT lateral recesses which may affect  the traversing L3 nerves. Mild bilateral neural foraminal narrowing. L3-4: Small broad-based disc bulge. Moderate to severe RIGHT and moderate LEFT facet arthropathy and ligamentum flavum redundancy resulting in moderate canal stenosis. Mild to moderate RIGHT neural foraminal narrowing. L4-5: Annular bulging. Moderate to severe facet arthropathy and ligamentum flavum redundancy resulting in moderate canal stenosis. Mild RIGHT and minimal LEFT neural foraminal narrowing. L5-S1: Small broad-based disc bulge. Severe LEFT greater than RIGHT facet arthropathy and ligamentum flavum redundancy without canal stenosis. 14 mm  RIGHT facet synovial cyst extending the paraspinal soft tissues. No canal stenosis. Minimal LEFT neural foraminal narrowing. IMPRESSION: MRI CERVICAL SPINE: No MR findings of lymphoma, sensitivity decreased by patient motion and lack of contrast. Degenerative cervical spine resulting in mild canal stenosis C6-7. Multilevel neural foraminal narrowing: Likely severe on the LEFT at C3-4 though limited by motion. MRI THORACIC SPINE: No MR findings of lymphoma, sensitivity decreased by patient motion and lack of contrast. Mild old T11 compression fracture. No canal stenosis or neural foraminal narrowing. MRI LUMBAR SPINE: No MR findings of lymphoma, sensitivity decreased by lack of contrast. Mild to moderate old L1 compression fracture. Grade 1 L1-2 and L2-3 retrolisthesis. Degenerative lumbar spine resulting in moderate canal stenosis L3-4 and L4-5. Multilevel neural foraminal narrowing: Mild to moderate on the RIGHT at L3-4. Electronically Signed   By: Elon Alas M.D.   On: 12/19/2016 21:42   Mr Thoracic Spine Wo Contrast  Result Date: 12/19/2016 CLINICAL DATA:  Headache today. New onset ataxia and dizziness, proximal weakness and recent falls. History of lymphoma, last chemotherapy 2 months ago. EXAM: MRI CERVICAL, THORACIC AND LUMBAR SPINE WITHOUT CONTRAST TECHNIQUE: Multiplanar and multiecho  pulse sequences of the cervical spine, to include the craniocervical junction and cervicothoracic junction, and thoracic and lumbar spine, were obtained without intravenous contrast. GFR 24. COMPARISON:  PET-CT October 15, 2016 FINDINGS: MRI CERVICAL SPINE FINDINGS- axial sequences are moderately motion degraded ALIGNMENT: Straightened cervical lordosis.  No malalignment. VERTEBRAE/DISCS: Vertebral bodies are intact. Moderate C5-6 and C6-7 disc height loss, with mild chronic discogenic endplate changes compatible with degenerative discs. Mild disc desiccation all cervical levels. Generalized bright T1 and bright T2 bone marrow signal compatible with osteopenia. No suspicious or acute bone marrow signal. CORD:Cervical spinal cord is normal morphology and signal characteristics from the cervicomedullary junction to level of T1-2, the most caudal well visualized level. POSTERIOR FOSSA, VERTEBRAL ARTERIES, PARASPINAL TISSUES: No MR findings of ligamentous injury. Vertebral artery flow voids present. Included posterior fossa and paraspinal soft tissues are normal. DISC LEVELS: C2-3:  No disc bulge, canal stenosis nor neural foraminal narrowing. C3-4: Small broad-based disc bulge LEFT subarticular disc protrusion suspected. Uncovertebral hypertrophy. Mild RIGHT, severe LEFT facet arthropathy. No canal stenosis. Severe suspected LEFT neural foraminal narrowing, mild on the RIGHT. C4-5: No disc bulge. Uncovertebral hypertrophy. Mild RIGHT, moderate LEFT facet arthropathy without canal stenosis. Mild probable LEFT neural foraminal narrowing. C5-6: Small broad-based disc bulge uncovertebral hypertrophy without canal stenosis. Mild to moderate probable neural foraminal narrowing. C6-7: Small broad-based disc bulge asymmetric to the RIGHT. Uncovertebral hypertrophy. Mild canal stenosis. At least mild RIGHT neural foraminal narrowing. C7-T1: No disc bulge, canal stenosis. Mild facet arthropathy. Mild RIGHT, mild to moderate  LEFT probable neural foraminal narrowing. MRI THORACIC SPINE FINDINGS-- mildly motion degraded examination. ALIGNMENT: Maintenance of the thoracic kyphosis. No malalignment. VERTEBRAE/DISCS: Mild old T11 compression fracture with less than 25% superior endplate height loss. Generalized bright T1 and bright T2 bone marrow signal compatible with osteopenia without acute or abnormal bone marrow signal. Mild T10-11 disc height loss and desiccation compatible with degenerative disc, mild chronic discogenic endplate changes. Scattered old Schmorl's nodes. CORD: Thoracic spinal cord is normal morphology and signal characteristics to the level of the conus medullaris. PREVERTEBRAL AND PARASPINAL SOFT TISSUES:  Normal. DISC LEVELS: No significant disc bulge, canal stenosis or neural foraminal narrowing at any level. MRI LUMBAR SPINE FINDINGS SEGMENTATION:  Consistent with cervical and thoracic spine labeling. ALIGNMENT: Maintenance of the lumbar lordosis. Grade 1 L1-2 retrolisthesis  and grade 1 L2-3 retrolisthesis. VERTEBRAE:Old mild-to-moderate L1 compression fracture with less than 30% superior endplate height loss. Severe L1-2 disc height loss, moderate to severe L2-3 and L3-4 with moderate subacute on chronic discogenic endplate changes. Multilevel acute on chronic Schmorl's nodes. Generalized bright T1 and bright T2 bone marrow signal compatible with osteopenia without acute or abnormal bone marrow signal. CONUS MEDULLARIS: Conus medullaris terminates at T12-L1 and demonstrates normal morphology and signal characteristics. Cauda equina is normal. PREVERTEBRAL AND PARASPINAL SOFT TISSUES: Nonacute. Cholelithiasis. Mild symmetric paraspinal muscle atrophy. DISC LEVELS: L1-2: Retrolisthesis. Small broad-based disc bulge asymmetric to LEFT. Mild canal stenosis including partial effacement LEFT lateral recess which may affect the traversing LEFT L2 nerve. Mild bilateral neural foraminal narrowing. L2-3: Retrolisthesis. Small  broad-based disc bulge. Mild facet arthropathy and ligamentum flavum redundancy. Mild canal stenosis including partial effacement RIGHT greater than LEFT lateral recesses which may affect the traversing L3 nerves. Mild bilateral neural foraminal narrowing. L3-4: Small broad-based disc bulge. Moderate to severe RIGHT and moderate LEFT facet arthropathy and ligamentum flavum redundancy resulting in moderate canal stenosis. Mild to moderate RIGHT neural foraminal narrowing. L4-5: Annular bulging. Moderate to severe facet arthropathy and ligamentum flavum redundancy resulting in moderate canal stenosis. Mild RIGHT and minimal LEFT neural foraminal narrowing. L5-S1: Small broad-based disc bulge. Severe LEFT greater than RIGHT facet arthropathy and ligamentum flavum redundancy without canal stenosis. 14 mm RIGHT facet synovial cyst extending the paraspinal soft tissues. No canal stenosis. Minimal LEFT neural foraminal narrowing. IMPRESSION: MRI CERVICAL SPINE: No MR findings of lymphoma, sensitivity decreased by patient motion and lack of contrast. Degenerative cervical spine resulting in mild canal stenosis C6-7. Multilevel neural foraminal narrowing: Likely severe on the LEFT at C3-4 though limited by motion. MRI THORACIC SPINE: No MR findings of lymphoma, sensitivity decreased by patient motion and lack of contrast. Mild old T11 compression fracture. No canal stenosis or neural foraminal narrowing. MRI LUMBAR SPINE: No MR findings of lymphoma, sensitivity decreased by lack of contrast. Mild to moderate old L1 compression fracture. Grade 1 L1-2 and L2-3 retrolisthesis. Degenerative lumbar spine resulting in moderate canal stenosis L3-4 and L4-5. Multilevel neural foraminal narrowing: Mild to moderate on the RIGHT at L3-4. Electronically Signed   By: Elon Alas M.D.   On: 12/19/2016 21:42   Mr Lumbar Spine Wo Contrast  Result Date: 12/19/2016 CLINICAL DATA:  Headache today. New onset ataxia and dizziness,  proximal weakness and recent falls. History of lymphoma, last chemotherapy 2 months ago. EXAM: MRI CERVICAL, THORACIC AND LUMBAR SPINE WITHOUT CONTRAST TECHNIQUE: Multiplanar and multiecho pulse sequences of the cervical spine, to include the craniocervical junction and cervicothoracic junction, and thoracic and lumbar spine, were obtained without intravenous contrast. GFR 24. COMPARISON:  PET-CT October 15, 2016 FINDINGS: MRI CERVICAL SPINE FINDINGS- axial sequences are moderately motion degraded ALIGNMENT: Straightened cervical lordosis.  No malalignment. VERTEBRAE/DISCS: Vertebral bodies are intact. Moderate C5-6 and C6-7 disc height loss, with mild chronic discogenic endplate changes compatible with degenerative discs. Mild disc desiccation all cervical levels. Generalized bright T1 and bright T2 bone marrow signal compatible with osteopenia. No suspicious or acute bone marrow signal. CORD:Cervical spinal cord is normal morphology and signal characteristics from the cervicomedullary junction to level of T1-2, the most caudal well visualized level. POSTERIOR FOSSA, VERTEBRAL ARTERIES, PARASPINAL TISSUES: No MR findings of ligamentous injury. Vertebral artery flow voids present. Included posterior fossa and paraspinal soft tissues are normal. DISC LEVELS: C2-3:  No disc bulge, canal stenosis nor neural foraminal narrowing. C3-4: Small  broad-based disc bulge LEFT subarticular disc protrusion suspected. Uncovertebral hypertrophy. Mild RIGHT, severe LEFT facet arthropathy. No canal stenosis. Severe suspected LEFT neural foraminal narrowing, mild on the RIGHT. C4-5: No disc bulge. Uncovertebral hypertrophy. Mild RIGHT, moderate LEFT facet arthropathy without canal stenosis. Mild probable LEFT neural foraminal narrowing. C5-6: Small broad-based disc bulge uncovertebral hypertrophy without canal stenosis. Mild to moderate probable neural foraminal narrowing. C6-7: Small broad-based disc bulge asymmetric to the RIGHT.  Uncovertebral hypertrophy. Mild canal stenosis. At least mild RIGHT neural foraminal narrowing. C7-T1: No disc bulge, canal stenosis. Mild facet arthropathy. Mild RIGHT, mild to moderate LEFT probable neural foraminal narrowing. MRI THORACIC SPINE FINDINGS-- mildly motion degraded examination. ALIGNMENT: Maintenance of the thoracic kyphosis. No malalignment. VERTEBRAE/DISCS: Mild old T11 compression fracture with less than 25% superior endplate height loss. Generalized bright T1 and bright T2 bone marrow signal compatible with osteopenia without acute or abnormal bone marrow signal. Mild T10-11 disc height loss and desiccation compatible with degenerative disc, mild chronic discogenic endplate changes. Scattered old Schmorl's nodes. CORD: Thoracic spinal cord is normal morphology and signal characteristics to the level of the conus medullaris. PREVERTEBRAL AND PARASPINAL SOFT TISSUES:  Normal. DISC LEVELS: No significant disc bulge, canal stenosis or neural foraminal narrowing at any level. MRI LUMBAR SPINE FINDINGS SEGMENTATION:  Consistent with cervical and thoracic spine labeling. ALIGNMENT: Maintenance of the lumbar lordosis. Grade 1 L1-2 retrolisthesis and grade 1 L2-3 retrolisthesis. VERTEBRAE:Old mild-to-moderate L1 compression fracture with less than 30% superior endplate height loss. Severe L1-2 disc height loss, moderate to severe L2-3 and L3-4 with moderate subacute on chronic discogenic endplate changes. Multilevel acute on chronic Schmorl's nodes. Generalized bright T1 and bright T2 bone marrow signal compatible with osteopenia without acute or abnormal bone marrow signal. CONUS MEDULLARIS: Conus medullaris terminates at T12-L1 and demonstrates normal morphology and signal characteristics. Cauda equina is normal. PREVERTEBRAL AND PARASPINAL SOFT TISSUES: Nonacute. Cholelithiasis. Mild symmetric paraspinal muscle atrophy. DISC LEVELS: L1-2: Retrolisthesis. Small broad-based disc bulge asymmetric to LEFT.  Mild canal stenosis including partial effacement LEFT lateral recess which may affect the traversing LEFT L2 nerve. Mild bilateral neural foraminal narrowing. L2-3: Retrolisthesis. Small broad-based disc bulge. Mild facet arthropathy and ligamentum flavum redundancy. Mild canal stenosis including partial effacement RIGHT greater than LEFT lateral recesses which may affect the traversing L3 nerves. Mild bilateral neural foraminal narrowing. L3-4: Small broad-based disc bulge. Moderate to severe RIGHT and moderate LEFT facet arthropathy and ligamentum flavum redundancy resulting in moderate canal stenosis. Mild to moderate RIGHT neural foraminal narrowing. L4-5: Annular bulging. Moderate to severe facet arthropathy and ligamentum flavum redundancy resulting in moderate canal stenosis. Mild RIGHT and minimal LEFT neural foraminal narrowing. L5-S1: Small broad-based disc bulge. Severe LEFT greater than RIGHT facet arthropathy and ligamentum flavum redundancy without canal stenosis. 14 mm RIGHT facet synovial cyst extending the paraspinal soft tissues. No canal stenosis. Minimal LEFT neural foraminal narrowing. IMPRESSION: MRI CERVICAL SPINE: No MR findings of lymphoma, sensitivity decreased by patient motion and lack of contrast. Degenerative cervical spine resulting in mild canal stenosis C6-7. Multilevel neural foraminal narrowing: Likely severe on the LEFT at C3-4 though limited by motion. MRI THORACIC SPINE: No MR findings of lymphoma, sensitivity decreased by patient motion and lack of contrast. Mild old T11 compression fracture. No canal stenosis or neural foraminal narrowing. MRI LUMBAR SPINE: No MR findings of lymphoma, sensitivity decreased by lack of contrast. Mild to moderate old L1 compression fracture. Grade 1 L1-2 and L2-3 retrolisthesis. Degenerative lumbar spine resulting in moderate canal  stenosis L3-4 and L4-5. Multilevel neural foraminal narrowing: Mild to moderate on the RIGHT at L3-4. Electronically  Signed   By: Elon Alas M.D.   On: 12/19/2016 21:42    Medications: I have reviewed the patient's current medications.  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 Negative restaging CT evaluation 10/24/2012  Nodular skin lesions at the right lower leg, status post a shave biopsy 07/15/2013 confirming a malignant B-cell lymphoma, diffuse large cell positive for CD20, BCL 6, BCL 2, and CD10  Staging bone marrow biopsy 07/31/2013, negative for involvement with lymphoma  Staging PET scan 07/30/2013-negative.   Status post palliative radiation right lower leg nodular skin lesions 08/17/2013 through 09/03/2013.New nodular skin lesions at the right lower leg April 2017, status postpsies of lesions at the right lower leg 12/07/2015 confirming large B-cell lymphoma, CD20 positive bio  staging PET scan 12/26/2015-hypermetabolic cutaneous and subcutaneous nodules in the right lower extremity, no other evidence of lymphoma  Cycle 1 bendamustine/Rituxan 01/03/2016  Cycle 2 bendamustine/Rituxan 01/31/2016  Clinical progression with enlargement of multiple right lower leg skin lesions 02/23/2016  Status post radiation right lower leg 03/14/2016 through 03/29/2016  Clinical progression with new and enlarging skin lesions right lower leg 07/06/2016  PET scan 07/12/2016-clear interval increase in size and metabolic activity of a large nodule posterior right calf and a smaller subcutaneous nodule medial right thigh; several more superficial right lower extremity lesions improved with reduction in size and metabolic activity; single focus of metabolic activity associated with the medial right clavicle with bonychange  Cycle 1 CHOP/Rituxan 07/17/2016  Cycle 2 CHOP/Rituxan 08/07/2016-Neulasta added  Cycle 3 CHOP/Rituxan 08/28/2016 with Neulasta support  Restaging PET  scan 09/14/2016-hypermetabolic subcutaneous nodules previously demonstrated within the right thigh and right calf have nearly resolved. There is some residual activity within the calf which could be postsurgical. Small focus of activity within the left latissimus dorsi muscle within the posterior chest wall without corresponding CT abnormality.  Cycle 4 CHOP/Rituxan 09/18/2016 (Adriamycin deleted, etoposide substituted daily 3)  Cycle 5 CHOP/rituximab with etoposide 10/08/2016  Cycle 6 CHOP/rituximab with etoposide 10/29/2016 2. Port-A-Cath placement 06/01/2011. Port-A-Cath removal 11/04/2012 3. Hypertension. 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 5. Hypercholesterolemia. 6. Osteoarthritis. 7. Malaise-? Secondary to progression of non-Hodgkin's lymphoma. Improved. 8. Staph infection right lower leg February 2015. 9. Right calf lesion with purulent drainage 02/09/2016, culture obtained-group B strep, doxycycline prescribed 10. Port-A-Cath placement 07/13/2016 11. 2-D echo 07/10/2016-estimated ejection fraction range of 55-60% 12. 07/17/2016 hepatitis Bsurface antigen negative, core antibody positive 13. Anemia/thrombocytopenia-persistent following final chemotherapy 10/29/2016 14. Paresthesias, leg weakness,  falls-negative brain CT 12/13/2016  Noncontrast brain MRI and MRIs cervical, thoracic, and lumbar spine negative on 12/19/2016 15. Urinary tract infection 16. Renal failure   Ms. Lowrimore appears improved from a neurologic standpoint. She is able to ambulate. There is persistent thrombocytopenia and progressive renal failure. The LDH was normal yesterday, but the bilirubin is elevated. I will check a reticulocyte count and review the peripheral blood smear to be sure there is no evidence of hemolytic uremic syndrome.  The neurologic evaluation including MRIs and a lumbar puncture are negative to date. The differential diagnosis for the neurologic symptoms continues to include treatment  related neurotoxicity, Guillian-barre syndrome, and potentially neurologic manifestations of a TTP-like illness.      Recommendations: 1. Check LDH,, reticulocyte count, bilirubin, and blood smear today 2. Consider a nephrology consult 3. I will review the peripheral  blood smear today.  Please call Oncology over the weekend as needed.  I appreciate the care from the medical and neurology services   Addendum: Markers of hemolysis are negative and the peripheral blood smear is not consistent with hemolytic uremic syndrome.   LOS: 2 days   Betsy Coder, MD   12/21/2016, 8:20 AM

## 2016-12-21 NOTE — Progress Notes (Signed)
Subjective: Feeling slightly better. No complaints.   Exam: Vitals:   12/21/16 0458 12/21/16 0900  BP: 103/63 125/73  Pulse: (!) 104 (!) 110  Resp: 20 18  Temp: 97.9 F (36.6 C) 98.1 F (36.7 C)    HEENT-  Normocephalic, no lesions, without obvious abnormality.  Normal external eye and conjunctiva.  Normal TM's bilaterally.  Normal auditory canals and external ears. Normal external nose, mucus membranes and septum.  Normal pharynx.   Neuro:--no significant change in exam XQ:JJHERD are equal and round. They are symmetrically reactive from 3-->2 mm. EOMI without nystagmus. Facial sensation is intact to light touch. Face is symmetric at rest with normal strength and mobility. Hearing is intact to conversational voice. Palate elevates symmetrically and uvula is midline. Voice is normal in tone, pitch and quality. Bilateral SCM and trapezii are 5/5. Tongue is midline with normal bulk and mobility.  Sensory: Pinprick and light touch intact throughout, bilaterally--patient has pain to light touch of lower extremities and mild pressure to upper thighs. She has no proprioception of toes or ankle. She has no vibratory sensation of bilateral feet and bilateral knees. She has decreased sensation to cold temperature up to her knees. 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 Given patient's history of acute renal failure, contrast is likely contraindicated. Consider correlation with cerebral spinal fluid studies and short-term follow-up.  MRI cervical/thoracic/lumbar spine: MRI  CERVICAL SPINE: No MR findings of lymphoma, sensitivity decreased by patient motion and lack of contrast.  Degenerative cervical spine resulting in mild canal stenosis C6-7. Multilevel neural foraminal narrowing: Likely severe on the LEFT at C3-4 though limited by motion.  MRI THORACIC SPINE: No MR findings of lymphoma, sensitivity decreased by patient motion and lack of contrast.  Mild old T11 compression fracture. No canal stenosis or neural foraminal narrowing.  MRI LUMBAR SPINE: No MR findings of lymphoma, sensitivity decreased by lack of contrast.  Mild to moderate old L1 compression fracture. Grade 1 L1-2 and L2-3 retrolisthesis.  Degenerative lumbar spine resulting in moderate canal stenosis L3-4 and L4-5. Multilevel neural foraminal narrowing: Mild to moderate on the RIGHT at L3-4.   Lumbar puncture was obtained: --Cytology pending    Ref. Range 12/20/2016 10:52 12/20/2016 10:52  Appearance, CSF Latest Ref Range: CLEAR  CLEAR CLEAR  Glucose, CSF Latest Ref Range: 40 - 70 mg/dL 51   RBC Count, CSF Latest Ref Range: 0 /cu mm 163 (H) 1 (H)  Other Cells, CSF Unknown TOO FEW TO COUNT,... TOO FEW TO COUNT,...  Color, CSF Latest Ref Range: COLORLESS  COLORLESS COLORLESS  Supernatant Unknown NOT INDICATED NOT INDICATED  Total  Protein, CSF Latest Ref Range: 15 - 45 mg/dL 28   Tube # Unknown 1 and 4 4  WBC, CSF Latest Ref Range: 0 - 5 /cu mm 1 1      Etta Quill PA-C [facility] [phone]  Her ankle strength does seem to be slightly better, still with some weakness more proximally.  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  With normal protein and improving symptoms now 3 weeks from onset, I would not favor treating if this were mild GBS.  For Oncovin related neuropathy there would not be any treatment. I don't think there is  any evidence to support [place] for Rituxan related neuropathy and with her improving symptoms, I would not favor pursuing this at this time.  No malignant cells on cytology, and I don't have high concern for this.   Recommendations: 1) Will need to follow up with Oncology  2) follow-up with neurology in 6-8 weeks for EMG 3) no further recommendations at this time, please call with further questions or concerns  Roland Rack, MD [facility] [phone]  If 7pm- 7am, please page neurology on call as listed in [place].   12/21/2016, 11:23 AM

## 2016-12-21 NOTE — Progress Notes (Signed)
Occupational Therapy Treatment Patient Details Name: Kelly Ray MRN: [id number] DOB: 1941/01/16 Today's Date: 12/21/2016    History of present illness Pt is a 76 y.o. 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 MRI on 4/25 showed Extensive white matter abnormality is a distribution most compatible.    OT comments  Pt making progress towards OT goals. Session focused on Energy Conservation Education and transfer from EOB to recliner. Pt HR max at low 140's during transfer and returned to normal level upon sitting. O2 levels were at 90 when OT left room, RN notified.   Follow Up Recommendations  No OT follow up;Supervision/Assistance - 24 hour    Equipment Recommendations  3 in 1 bedside commode (to use as shower chair)    Recommendations for Other Services      Precautions / Restrictions Precautions Precautions: Fall Restrictions Weight Bearing Restrictions: No       Mobility Bed Mobility               General bed mobility comments: Pt sitting EOB when OT entered the room.  Transfers Overall transfer level: Needs assistance Equipment used: Rolling walker (2 wheeled) Transfers: Sit to/from Stand Sit to Stand: Mod assist         General transfer comment: increased time, VC'ing for bilateral hand placement, mod assist for power up    Balance Overall balance assessment: Needs assistance;History of Falls Sitting-balance support: Feet supported;No upper extremity supported Sitting balance-Leahy Scale: Fair Sitting balance - Comments: pt able to sit EOB with supervision and no physical assist needed   Standing balance support: Bilateral upper extremity supported Standing balance-Leahy Scale: Poor Standing balance comment: pt reliant on  bilateral UEs on RW                           ADL either performed or assessed with clinical judgement   ADL                           Toilet Transfer: Minimal assistance;Ambulation;Comfort height toilet;RW Armed forces technical officer Details (indicate cue type and reason): simulated with short ambulation to recliner         Functional mobility during ADLs: Minimal assistance;Rolling walker General ADL Comments: HR was at 140's max during session, O2 at 90 after seated in chair. RN notified     Vision       Perception     Praxis      Cognition Arousal/Alertness: Awake/alert Behavior During Therapy: WFL for tasks assessed/performed Overall Cognitive Status: Within Functional Limits for tasks assessed                                          Exercises     Shoulder Instructions       General Comments Pt's husband present for session    Pertinent Vitals/ Pain       Pain Assessment: No/denies pain  Home Living  Prior Functioning/Environment              Frequency  Min 2X/week        Progress Toward Goals  OT Goals(current goals can now be found in the care plan section)  Progress towards OT goals: Progressing toward goals  Acute Rehab OT Goals Patient Stated Goal: return home OT Goal Formulation: With patient Time For Goal Achievement: 01/03/17 Potential to Achieve Goals: Good  Plan Discharge plan remains appropriate    Co-evaluation                 End of Session Equipment Utilized During Treatment: Gait belt;Rolling walker  OT Visit Diagnosis: Unsteadiness on feet (R26.81);Repeated falls (R29.6);Ataxia, unspecified (R27.0);Muscle weakness (generalized) (M62.81)   Activity Tolerance Patient tolerated treatment well   Patient Left in chair;with call bell/phone within reach;with chair alarm set;with family/visitor present   Nurse Communication  Mobility status;Other (comment) (02 levels)        Time: 4784-1282 OT Time Calculation (min): 25 min  Charges: OT General Charges $OT Visit: 1 Procedure OT Treatments $Self Care/Home Management : 23-37 mins  Hulda Humphrey OTR/L [place] 12/21/2016, 4:13 PM

## 2016-12-21 NOTE — Progress Notes (Signed)
PROGRESS NOTE    Kelly Ray  FHL:[id number] DOB: 04-Dec-1940 DOA: 12/19/2016 PCP: [name], MD   Brief Narrative: 76 y.o. 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 The patient was seen at oncology clinic on the day of admission, who recommended admission in the hospital for further evaluation. Patient's oncologist is Dr. Learta Codding. Patient reported that she was treated with Bactrim for UTI recently. She still has foul-smelling urine.  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 As per neurologist possibilities include oncovin related neuropathy, rituxan related demyelinating neuropathy, carcinomatous meningitis, or less likely myelopathy. 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 Neurology and oncology evaluation ongoing.  -Status post lumbar puncture with normal results. Follow-up CSF cytology with oncologist. -Continue current medical and supportive care.  #History of high-grade diffuse large B-cell lymphoma status post recent chemotherapy: Follow-up oncology consult service. LDH not elevated.  #Hypotension: Blood pressure better controlled. Continue to hold Hyzaar and will start low-dose metoprolol because of tachycardia. Continue to monitor.  #Acute kidney injury likely in the setting of UTI and dehydration. Continue to treat for UTI. Checking renal ultrasound. Monitor urine output. Avoid nephrotoxins. Hold Hyzaar. Discussed with the patient and her husband at bedside regarding his renal failure and plan of care.  #Mild hyponatremia and hypokalemia: Continue fluid. Monitor  labs.  #Urinary tract infection: Patient had recent UTI growing multiresistant Escherichia coli. Treated with bactrim. Currently UA with UTI Patient has foul smelling urine.  Discussed with Dr. Johnnye Sima on 4/26, staring IV meropenem for at least 5 days. Repeat urine culture growing Escherichia coli, sensitivity pending.  # Hypothyroidism: Continue Synthroid.  #Likely mild protein calorie malnutrition: Dietary referral.  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 Disposition Plan: Likely discharge home in 2-3 days    Consultants:   Neurology  Oncology  Phone consult with infectious disease Dr. Johnnye Sima on 4/26  Procedures: None Antimicrobials: IV meropenem since 4/26  Subjective: Patient was seen and examined at bedside. Patient reported feeling much better today. Denied headache, dizziness, nausea, vomiting, chest pain, shortness of breath. Husband at bedside. Objective: Vitals:   12/21/16 0028 12/21/16 0458 12/21/16 0900 12/21/16 1200  BP: (!) 95/55 103/63 125/73 107/63  Pulse: (!) 103 (!) 104 (!) 110 (!) 102  Resp: 18 20 18 18   Temp: 98.3 F (36.8 C) 97.9 F (36.6 C) 98.1 F (36.7 C) 98.3 F (36.8 C)  TempSrc: Oral Oral Oral Oral  SpO2: 99% 98% 100% 97%  Weight:      Height:        Intake/Output Summary (Last 24 hours) at 12/21/16 1301 Last data filed at 12/21/16 0900  Gross per 24 hour  Intake             1695 ml  Output              350 ml  Net  1345 ml   Filed Weights   12/19/16 1419  Weight: 76.1 kg (167 lb 11.2 oz)    Examination:  General exam: Not in distress  Respiratory system: Clear to auscultation. Respiratory effort normal. No wheezing or  crackle Cardiovascular system: Regular rate rhythm, S1-S2 normal. Gastrointestinal system: Abdomen is nondistended, soft and nontender. Normal bowel sounds heard. Central nervous system: Alert awake and following commands. Extremities: No pedal edema, muscle strength 5 over 5 Skin: No rashes, lesions or ulcers Psychiatry: Judgement and insight appear normal. Mood & affect appropriate.     Data Reviewed: I have personally reviewed following labs and imaging studies  CBC:  Recent Labs Lab 12/20/16 0424 12/21/16 0433  WBC 5.3 5.3  HGB 8.1* 8.6*  HCT 24.7* 25.5*  MCV 91.1 91.4  PLT 114* 81*   Basic Metabolic Panel:  Recent Labs Lab 12/19/16 1658 12/20/16 0424 12/21/16 0433  NA 131* 132* 133*  K 3.4* 3.4* 4.2  CL 97* 99* 102  CO2 24 24 23   GLUCOSE 138* 99 101*  BUN 35* 42* 50*  CREATININE 1.96* 2.15* 2.29*  CALCIUM 8.6* 8.0* 8.1*   GFR: Estimated Creatinine Clearance: 21.1 mL/min (A) (by C-G formula based on SCr of 2.29 mg/dL (H)). Liver Function Tests:  Recent Labs Lab 12/19/16 1658 12/20/16 0424  AST 17 15  ALT 13* 12*  ALKPHOS 67 76  BILITOT 2.6* 2.3*  PROT 5.4* 4.9*  ALBUMIN 2.8* 2.5*   No results for input(s): LIPASE, AMYLASE in the last 168 hours. No results for input(s): AMMONIA in the last 168 hours. Coagulation Profile:  Recent Labs Lab 12/19/16 1508  INR 1.42   Cardiac Enzymes: No results for input(s): CKTOTAL, CKMB, CKMBINDEX, TROPONINI in the last 168 hours. BNP (last 3 results) No results for input(s): PROBNP in the last 8760 hours. HbA1C: No results for input(s): HGBA1C in the last 72 hours. CBG: No results for input(s): GLUCAP in the last 168 hours. Lipid Profile: No results for input(s): CHOL, HDL, LDLCALC, TRIG, CHOLHDL, LDLDIRECT in the last 72 hours. Thyroid Function Tests: No results for input(s): TSH, T4TOTAL, FREET4, T3FREE, THYROIDAB in the last 72 hours. Anemia Panel: No results for input(s): VITAMINB12, FOLATE, FERRITIN,  TIBC, IRON, RETICCTPCT in the last 72 hours. Sepsis Labs: No results for input(s): PROCALCITON, LATICACIDVEN in the last 168 hours.  Recent Results (from the past 240 hour(s))  Culture, Urine     Status: Abnormal (Preliminary result)   Collection Time: 12/20/16  7:00 AM  Result Value Ref Range Status   Specimen Description Urine  Final   Special Requests NONE  Final   Culture (A)  Final    >=100,000 COLONIES/mL ESCHERICHIA COLI SUSCEPTIBILITIES TO FOLLOW    Report Status PENDING  Incomplete         Radiology Studies: Mr Brain 4 Contrast  Addendum Date: 12/19/2016   ADDENDUM REPORT: 12/19/2016 22:36 ADDENDUM: Acute findings discussed with and reconfirmed by Dr.Kirby, [facility] on 12/19/2016 at 9:44 p.m. Electronically Signed   By: Elon Alas M.D.   On: 12/19/2016 22:36   Result Date: 12/19/2016 CLINICAL DATA:  Headache today, new onset ataxia and dizziness, proximal weakness and recent fall. History of lymphoma, last chemotherapy 2 months ago. EXAM: MRI HEAD WITHOUT CONTRAST TECHNIQUE: Multiplanar, multiecho pulse sequences of the brain and surrounding structures were obtained without intravenous contrast. GFR 24, in acute renal failure. COMPARISON:  None. FINDINGS: BRAIN: Patchy to confluent reduced diffusion in the periventricular white matter, most prominent within the splenium of  the corpus callosum with intermediate to low ADC values, a amorphous bright FLAIR T2 hyperintense signal. No midline shift, mass effect or discrete masses. Ventricles and sulci are normal for patient's age. No abnormal extra-axial fluid collections. No susceptibility artifact to suggest hemorrhage. VASCULAR: Normal major intracranial vascular flow voids present at skull base. SKULL AND UPPER CERVICAL SPINE: No abnormal sellar expansion. No suspicious calvarial bone marrow signal. Craniocervical junction maintained. SINUSES/ORBITS: The mastoid air-cells and included paranasal sinuses are well-aerated. The  included ocular globes and orbital contents are non-suspicious. OTHER: None. 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 Given patient's history of acute renal failure, contrast is likely contraindicated. Consider correlation with cerebral spinal fluid studies and short-term follow-up. Electronically Signed: By: Elon Alas M.D. On: 12/19/2016 21:12   Mr Cervical Spine Wo Contrast  Result Date: 12/19/2016 CLINICAL DATA:  Headache today. New onset ataxia and dizziness, proximal weakness and recent falls. History of lymphoma, last chemotherapy 2 months ago. EXAM: MRI CERVICAL, THORACIC AND LUMBAR SPINE WITHOUT CONTRAST TECHNIQUE: Multiplanar and multiecho pulse sequences of the cervical spine, to include the craniocervical junction and cervicothoracic junction, and thoracic and lumbar spine, were obtained without intravenous contrast. GFR 24. COMPARISON:  PET-CT October 15, 2016 FINDINGS: MRI CERVICAL SPINE FINDINGS- axial sequences are moderately motion degraded ALIGNMENT: Straightened cervical lordosis.  No malalignment. VERTEBRAE/DISCS: Vertebral bodies are intact. Moderate C5-6 and C6-7 disc height loss, with mild chronic discogenic endplate changes compatible with degenerative discs. Mild disc desiccation all cervical levels. Generalized bright T1 and bright T2 bone marrow signal compatible with osteopenia. No suspicious or acute bone marrow signal. CORD:Cervical spinal cord is normal morphology and signal characteristics from the cervicomedullary junction to level of T1-2, the most caudal well visualized level. POSTERIOR FOSSA, VERTEBRAL ARTERIES, PARASPINAL TISSUES: No MR findings of ligamentous injury. Vertebral artery flow voids present. Included posterior fossa and paraspinal soft tissues are normal. DISC LEVELS: C2-3:  No disc bulge, canal  stenosis nor neural foraminal narrowing. C3-4: Small broad-based disc bulge LEFT subarticular disc protrusion suspected. Uncovertebral hypertrophy. Mild RIGHT, severe LEFT facet arthropathy. No canal stenosis. Severe suspected LEFT neural foraminal narrowing, mild on the RIGHT. C4-5: No disc bulge. Uncovertebral hypertrophy. Mild RIGHT, moderate LEFT facet arthropathy without canal stenosis. Mild probable LEFT neural foraminal narrowing. C5-6: Small broad-based disc bulge uncovertebral hypertrophy without canal stenosis. Mild to moderate probable neural foraminal narrowing. C6-7: Small broad-based disc bulge asymmetric to the RIGHT. Uncovertebral hypertrophy. Mild canal stenosis. At least mild RIGHT neural foraminal narrowing. C7-T1: No disc bulge, canal stenosis. Mild facet arthropathy. Mild RIGHT, mild to moderate LEFT probable neural foraminal narrowing. MRI THORACIC SPINE FINDINGS-- mildly motion degraded examination. ALIGNMENT: Maintenance of the thoracic kyphosis. No malalignment. VERTEBRAE/DISCS: Mild old T11 compression fracture with less than 25% superior endplate height loss. Generalized bright T1 and bright T2 bone marrow signal compatible with osteopenia without acute or abnormal bone marrow signal. Mild T10-11 disc height loss and desiccation compatible with degenerative disc, mild chronic discogenic endplate changes. Scattered old Schmorl's nodes. CORD: Thoracic spinal cord is normal morphology and signal characteristics to the level of the conus medullaris. PREVERTEBRAL AND PARASPINAL SOFT TISSUES:  Normal. DISC LEVELS: No significant disc bulge, canal stenosis or neural foraminal narrowing at any level. MRI LUMBAR SPINE FINDINGS SEGMENTATION:  Consistent with cervical and thoracic spine labeling. ALIGNMENT: Maintenance of the lumbar lordosis. Grade 1 L1-2 retrolisthesis and grade 1 L2-3 retrolisthesis. VERTEBRAE:Old mild-to-moderate  L1 compression fracture with less than 30% superior endplate height  loss. Severe L1-2 disc height loss, moderate to severe L2-3 and L3-4 with moderate subacute on chronic discogenic endplate changes. Multilevel acute on chronic Schmorl's nodes. Generalized bright T1 and bright T2 bone marrow signal compatible with osteopenia without acute or abnormal bone marrow signal. CONUS MEDULLARIS: Conus medullaris terminates at T12-L1 and demonstrates normal morphology and signal characteristics. Cauda equina is normal. PREVERTEBRAL AND PARASPINAL SOFT TISSUES: Nonacute. Cholelithiasis. Mild symmetric paraspinal muscle atrophy. DISC LEVELS: L1-2: Retrolisthesis. Small broad-based disc bulge asymmetric to LEFT. Mild canal stenosis including partial effacement LEFT lateral recess which may affect the traversing LEFT L2 nerve. Mild bilateral neural foraminal narrowing. L2-3: Retrolisthesis. Small broad-based disc bulge. Mild facet arthropathy and ligamentum flavum redundancy. Mild canal stenosis including partial effacement RIGHT greater than LEFT lateral recesses which may affect the traversing L3 nerves. Mild bilateral neural foraminal narrowing. L3-4: Small broad-based disc bulge. Moderate to severe RIGHT and moderate LEFT facet arthropathy and ligamentum flavum redundancy resulting in moderate canal stenosis. Mild to moderate RIGHT neural foraminal narrowing. L4-5: Annular bulging. Moderate to severe facet arthropathy and ligamentum flavum redundancy resulting in moderate canal stenosis. Mild RIGHT and minimal LEFT neural foraminal narrowing. L5-S1: Small broad-based disc bulge. Severe LEFT greater than RIGHT facet arthropathy and ligamentum flavum redundancy without canal stenosis. 14 mm RIGHT facet synovial cyst extending the paraspinal soft tissues. No canal stenosis. Minimal LEFT neural foraminal narrowing. IMPRESSION: MRI CERVICAL SPINE: No MR findings of lymphoma, sensitivity decreased by patient motion and lack of contrast. Degenerative cervical spine resulting in mild canal stenosis  C6-7. Multilevel neural foraminal narrowing: Likely severe on the LEFT at C3-4 though limited by motion. MRI THORACIC SPINE: No MR findings of lymphoma, sensitivity decreased by patient motion and lack of contrast. Mild old T11 compression fracture. No canal stenosis or neural foraminal narrowing. MRI LUMBAR SPINE: No MR findings of lymphoma, sensitivity decreased by lack of contrast. Mild to moderate old L1 compression fracture. Grade 1 L1-2 and L2-3 retrolisthesis. Degenerative lumbar spine resulting in moderate canal stenosis L3-4 and L4-5. Multilevel neural foraminal narrowing: Mild to moderate on the RIGHT at L3-4. Electronically Signed   By: Elon Alas M.D.   On: 12/19/2016 21:42   Mr Thoracic Spine Wo Contrast  Result Date: 12/19/2016 CLINICAL DATA:  Headache today. New onset ataxia and dizziness, proximal weakness and recent falls. History of lymphoma, last chemotherapy 2 months ago. EXAM: MRI CERVICAL, THORACIC AND LUMBAR SPINE WITHOUT CONTRAST TECHNIQUE: Multiplanar and multiecho pulse sequences of the cervical spine, to include the craniocervical junction and cervicothoracic junction, and thoracic and lumbar spine, were obtained without intravenous contrast. GFR 24. COMPARISON:  PET-CT October 15, 2016 FINDINGS: MRI CERVICAL SPINE FINDINGS- axial sequences are moderately motion degraded ALIGNMENT: Straightened cervical lordosis.  No malalignment. VERTEBRAE/DISCS: Vertebral bodies are intact. Moderate C5-6 and C6-7 disc height loss, with mild chronic discogenic endplate changes compatible with degenerative discs. Mild disc desiccation all cervical levels. Generalized bright T1 and bright T2 bone marrow signal compatible with osteopenia. No suspicious or acute bone marrow signal. CORD:Cervical spinal cord is normal morphology and signal characteristics from the cervicomedullary junction to level of T1-2, the most caudal well visualized level. POSTERIOR FOSSA, VERTEBRAL ARTERIES, PARASPINAL  TISSUES: No MR findings of ligamentous injury. Vertebral artery flow voids present. Included posterior fossa and paraspinal soft tissues are normal. DISC LEVELS: C2-3:  No disc bulge, canal stenosis nor neural foraminal narrowing. C3-4: Small broad-based disc bulge LEFT subarticular disc  protrusion suspected. Uncovertebral hypertrophy. Mild RIGHT, severe LEFT facet arthropathy. No canal stenosis. Severe suspected LEFT neural foraminal narrowing, mild on the RIGHT. C4-5: No disc bulge. Uncovertebral hypertrophy. Mild RIGHT, moderate LEFT facet arthropathy without canal stenosis. Mild probable LEFT neural foraminal narrowing. C5-6: Small broad-based disc bulge uncovertebral hypertrophy without canal stenosis. Mild to moderate probable neural foraminal narrowing. C6-7: Small broad-based disc bulge asymmetric to the RIGHT. Uncovertebral hypertrophy. Mild canal stenosis. At least mild RIGHT neural foraminal narrowing. C7-T1: No disc bulge, canal stenosis. Mild facet arthropathy. Mild RIGHT, mild to moderate LEFT probable neural foraminal narrowing. MRI THORACIC SPINE FINDINGS-- mildly motion degraded examination. ALIGNMENT: Maintenance of the thoracic kyphosis. No malalignment. VERTEBRAE/DISCS: Mild old T11 compression fracture with less than 25% superior endplate height loss. Generalized bright T1 and bright T2 bone marrow signal compatible with osteopenia without acute or abnormal bone marrow signal. Mild T10-11 disc height loss and desiccation compatible with degenerative disc, mild chronic discogenic endplate changes. Scattered old Schmorl's nodes. CORD: Thoracic spinal cord is normal morphology and signal characteristics to the level of the conus medullaris. PREVERTEBRAL AND PARASPINAL SOFT TISSUES:  Normal. DISC LEVELS: No significant disc bulge, canal stenosis or neural foraminal narrowing at any level. MRI LUMBAR SPINE FINDINGS SEGMENTATION:  Consistent with cervical and thoracic spine labeling. ALIGNMENT:  Maintenance of the lumbar lordosis. Grade 1 L1-2 retrolisthesis and grade 1 L2-3 retrolisthesis. VERTEBRAE:Old mild-to-moderate L1 compression fracture with less than 30% superior endplate height loss. Severe L1-2 disc height loss, moderate to severe L2-3 and L3-4 with moderate subacute on chronic discogenic endplate changes. Multilevel acute on chronic Schmorl's nodes. Generalized bright T1 and bright T2 bone marrow signal compatible with osteopenia without acute or abnormal bone marrow signal. CONUS MEDULLARIS: Conus medullaris terminates at T12-L1 and demonstrates normal morphology and signal characteristics. Cauda equina is normal. PREVERTEBRAL AND PARASPINAL SOFT TISSUES: Nonacute. Cholelithiasis. Mild symmetric paraspinal muscle atrophy. DISC LEVELS: L1-2: Retrolisthesis. Small broad-based disc bulge asymmetric to LEFT. Mild canal stenosis including partial effacement LEFT lateral recess which may affect the traversing LEFT L2 nerve. Mild bilateral neural foraminal narrowing. L2-3: Retrolisthesis. Small broad-based disc bulge. Mild facet arthropathy and ligamentum flavum redundancy. Mild canal stenosis including partial effacement RIGHT greater than LEFT lateral recesses which may affect the traversing L3 nerves. Mild bilateral neural foraminal narrowing. L3-4: Small broad-based disc bulge. Moderate to severe RIGHT and moderate LEFT facet arthropathy and ligamentum flavum redundancy resulting in moderate canal stenosis. Mild to moderate RIGHT neural foraminal narrowing. L4-5: Annular bulging. Moderate to severe facet arthropathy and ligamentum flavum redundancy resulting in moderate canal stenosis. Mild RIGHT and minimal LEFT neural foraminal narrowing. L5-S1: Small broad-based disc bulge. Severe LEFT greater than RIGHT facet arthropathy and ligamentum flavum redundancy without canal stenosis. 14 mm RIGHT facet synovial cyst extending the paraspinal soft tissues. No canal stenosis. Minimal LEFT neural  foraminal narrowing. IMPRESSION: MRI CERVICAL SPINE: No MR findings of lymphoma, sensitivity decreased by patient motion and lack of contrast. Degenerative cervical spine resulting in mild canal stenosis C6-7. Multilevel neural foraminal narrowing: Likely severe on the LEFT at C3-4 though limited by motion. MRI THORACIC SPINE: No MR findings of lymphoma, sensitivity decreased by patient motion and lack of contrast. Mild old T11 compression fracture. No canal stenosis or neural foraminal narrowing. MRI LUMBAR SPINE: No MR findings of lymphoma, sensitivity decreased by lack of contrast. Mild to moderate old L1 compression fracture. Grade 1 L1-2 and L2-3 retrolisthesis. Degenerative lumbar spine resulting in moderate canal stenosis L3-4 and L4-5. Multilevel neural  foraminal narrowing: Mild to moderate on the RIGHT at L3-4. Electronically Signed   By: Elon Alas M.D.   On: 12/19/2016 21:42   Mr Lumbar Spine Wo Contrast  Result Date: 12/19/2016 CLINICAL DATA:  Headache today. New onset ataxia and dizziness, proximal weakness and recent falls. History of lymphoma, last chemotherapy 2 months ago. EXAM: MRI CERVICAL, THORACIC AND LUMBAR SPINE WITHOUT CONTRAST TECHNIQUE: Multiplanar and multiecho pulse sequences of the cervical spine, to include the craniocervical junction and cervicothoracic junction, and thoracic and lumbar spine, were obtained without intravenous contrast. GFR 24. COMPARISON:  PET-CT October 15, 2016 FINDINGS: MRI CERVICAL SPINE FINDINGS- axial sequences are moderately motion degraded ALIGNMENT: Straightened cervical lordosis.  No malalignment. VERTEBRAE/DISCS: Vertebral bodies are intact. Moderate C5-6 and C6-7 disc height loss, with mild chronic discogenic endplate changes compatible with degenerative discs. Mild disc desiccation all cervical levels. Generalized bright T1 and bright T2 bone marrow signal compatible with osteopenia. No suspicious or acute bone marrow signal. CORD:Cervical  spinal cord is normal morphology and signal characteristics from the cervicomedullary junction to level of T1-2, the most caudal well visualized level. POSTERIOR FOSSA, VERTEBRAL ARTERIES, PARASPINAL TISSUES: No MR findings of ligamentous injury. Vertebral artery flow voids present. Included posterior fossa and paraspinal soft tissues are normal. DISC LEVELS: C2-3:  No disc bulge, canal stenosis nor neural foraminal narrowing. C3-4: Small broad-based disc bulge LEFT subarticular disc protrusion suspected. Uncovertebral hypertrophy. Mild RIGHT, severe LEFT facet arthropathy. No canal stenosis. Severe suspected LEFT neural foraminal narrowing, mild on the RIGHT. C4-5: No disc bulge. Uncovertebral hypertrophy. Mild RIGHT, moderate LEFT facet arthropathy without canal stenosis. Mild probable LEFT neural foraminal narrowing. C5-6: Small broad-based disc bulge uncovertebral hypertrophy without canal stenosis. Mild to moderate probable neural foraminal narrowing. C6-7: Small broad-based disc bulge asymmetric to the RIGHT. Uncovertebral hypertrophy. Mild canal stenosis. At least mild RIGHT neural foraminal narrowing. C7-T1: No disc bulge, canal stenosis. Mild facet arthropathy. Mild RIGHT, mild to moderate LEFT probable neural foraminal narrowing. MRI THORACIC SPINE FINDINGS-- mildly motion degraded examination. ALIGNMENT: Maintenance of the thoracic kyphosis. No malalignment. VERTEBRAE/DISCS: Mild old T11 compression fracture with less than 25% superior endplate height loss. Generalized bright T1 and bright T2 bone marrow signal compatible with osteopenia without acute or abnormal bone marrow signal. Mild T10-11 disc height loss and desiccation compatible with degenerative disc, mild chronic discogenic endplate changes. Scattered old Schmorl's nodes. CORD: Thoracic spinal cord is normal morphology and signal characteristics to the level of the conus medullaris. PREVERTEBRAL AND PARASPINAL SOFT TISSUES:  Normal. DISC  LEVELS: No significant disc bulge, canal stenosis or neural foraminal narrowing at any level. MRI LUMBAR SPINE FINDINGS SEGMENTATION:  Consistent with cervical and thoracic spine labeling. ALIGNMENT: Maintenance of the lumbar lordosis. Grade 1 L1-2 retrolisthesis and grade 1 L2-3 retrolisthesis. VERTEBRAE:Old mild-to-moderate L1 compression fracture with less than 30% superior endplate height loss. Severe L1-2 disc height loss, moderate to severe L2-3 and L3-4 with moderate subacute on chronic discogenic endplate changes. Multilevel acute on chronic Schmorl's nodes. Generalized bright T1 and bright T2 bone marrow signal compatible with osteopenia without acute or abnormal bone marrow signal. CONUS MEDULLARIS: Conus medullaris terminates at T12-L1 and demonstrates normal morphology and signal characteristics. Cauda equina is normal. PREVERTEBRAL AND PARASPINAL SOFT TISSUES: Nonacute. Cholelithiasis. Mild symmetric paraspinal muscle atrophy. DISC LEVELS: L1-2: Retrolisthesis. Small broad-based disc bulge asymmetric to LEFT. Mild canal stenosis including partial effacement LEFT lateral recess which may affect the traversing LEFT L2 nerve. Mild bilateral neural foraminal narrowing. L2-3: Retrolisthesis. Small  broad-based disc bulge. Mild facet arthropathy and ligamentum flavum redundancy. Mild canal stenosis including partial effacement RIGHT greater than LEFT lateral recesses which may affect the traversing L3 nerves. Mild bilateral neural foraminal narrowing. L3-4: Small broad-based disc bulge. Moderate to severe RIGHT and moderate LEFT facet arthropathy and ligamentum flavum redundancy resulting in moderate canal stenosis. Mild to moderate RIGHT neural foraminal narrowing. L4-5: Annular bulging. Moderate to severe facet arthropathy and ligamentum flavum redundancy resulting in moderate canal stenosis. Mild RIGHT and minimal LEFT neural foraminal narrowing. L5-S1: Small broad-based disc bulge. Severe LEFT greater than  RIGHT facet arthropathy and ligamentum flavum redundancy without canal stenosis. 14 mm RIGHT facet synovial cyst extending the paraspinal soft tissues. No canal stenosis. Minimal LEFT neural foraminal narrowing. IMPRESSION: MRI CERVICAL SPINE: No MR findings of lymphoma, sensitivity decreased by patient motion and lack of contrast. Degenerative cervical spine resulting in mild canal stenosis C6-7. Multilevel neural foraminal narrowing: Likely severe on the LEFT at C3-4 though limited by motion. MRI THORACIC SPINE: No MR findings of lymphoma, sensitivity decreased by patient motion and lack of contrast. Mild old T11 compression fracture. No canal stenosis or neural foraminal narrowing. MRI LUMBAR SPINE: No MR findings of lymphoma, sensitivity decreased by lack of contrast. Mild to moderate old L1 compression fracture. Grade 1 L1-2 and L2-3 retrolisthesis. Degenerative lumbar spine resulting in moderate canal stenosis L3-4 and L4-5. Multilevel neural foraminal narrowing: Mild to moderate on the RIGHT at L3-4. Electronically Signed   By: Elon Alas M.D.   On: 12/19/2016 21:42        Scheduled Meds: . enoxaparin (LOVENOX) injection  30 mg Subcutaneous Q24H  . feeding supplement (ENSURE ENLIVE)  237 mL Oral BID BM  . levothyroxine  112 mcg Oral QAC breakfast  . pravastatin  40 mg Oral QHS   Continuous Infusions: . 0.9 % NaCl with KCl 40 mEq / L 75 mL/hr (12/21/16 0715)  . meropenem (MERREM) IV Stopped (12/21/16 6144)     LOS: 2 days    Yarianna Varble Tanna Furry, MD [facility] Pager [phone]  If 7PM-7AM, please contact night-coverage [email or web address] Password [email or web address] 12/21/2016, 1:01 PM

## 2016-12-21 NOTE — Progress Notes (Signed)
1Physical Therapy Treatment Patient Details Name: Kelly Ray MRN: [id number] DOB: 28-Aug-1940 Today's Date: 12/21/2016    History of Present Illness Pt is a 76 y.o. 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 MRI on 4/25 showed Extensive white matter abnormality is a distribution most compatible.     PT Comments    Pt presented supine in bed with HOB elevated, awake and willing to participate in therapy session. Pt making steady progress with mobility, requiring less physical assistance and tolerating ambulating further distance this session. Pt's HR with improved response to activity this session as well, maintaining in the low 120's until the very end HR increased briefly to 140 with bed mobility. Pt would continue to benefit from skilled physical therapy services at this time while admitted and after d/c to address the below listed limitations in order to improve overall safety and independence with functional mobility.    Follow Up Recommendations  Home health PT;Supervision/Assistance - 24 hour     Equipment Recommendations  None recommended by PT    Recommendations for Other Services       Precautions / Restrictions Precautions Precautions: Fall Restrictions Weight Bearing Restrictions: No    Mobility  Bed Mobility Overal bed mobility: Needs Assistance Bed Mobility: Rolling;Sidelying to Sit;Sit to Sidelying Rolling: Supervision Sidelying to sit: Min guard     Sit to sidelying: Min guard General bed mobility comments: increased time, use of bed rails, min guard for safety  Transfers Overall transfer level: Needs assistance Equipment used: Rolling walker (2 wheeled) Transfers: Sit to/from Stand Sit to Stand: Min guard          General transfer comment: increased time, VC'ing for bilateral hand placement, min guard for safety  Ambulation/Gait Ambulation/Gait assistance: Min guard Ambulation Distance (Feet): 75 Feet Assistive device: Rolling walker (2 wheeled) Gait Pattern/deviations: Step-through pattern;Decreased step length - right;Decreased step length - left;Decreased stride length (significant pronation bilaterally) Gait velocity: decreased Gait velocity interpretation: Below normal speed for age/gender General Gait Details: mild instability with ambulation but no LOB or need for physical assistance, min guard for safety   Stairs            Wheelchair Mobility    Modified Rankin (Stroke Patients Only)       Balance Overall balance assessment: Needs assistance;History of Falls Sitting-balance support: Feet supported;No upper extremity supported Sitting balance-Leahy Scale: Fair Sitting balance - Comments: pt able to sit EOB with supervision and improved trunk control   Standing balance support: Bilateral upper extremity supported Standing balance-Leahy Scale: Poor Standing balance comment: pt reliant on bilateral UEs on RW                            Cognition Arousal/Alertness: Awake/alert Behavior During Therapy: WFL for tasks assessed/performed Overall Cognitive Status: Within Functional Limits for tasks assessed                                        Exercises General Exercises - Lower Extremity Ankle Circles/Pumps: AROM;Both;10 reps;Seated Long Arc Quad: AROM;Strengthening;Both;10 reps;Seated Hip Flexion/Marching: AROM;Both;15 reps;Seated Toe Raises: AROM;Both;10 reps;Seated    General Comments  Pertinent Vitals/Pain Pain Assessment: No/denies pain    Home Living                      Prior Function            PT Goals (current goals can now be found in the care plan section) Acute Rehab PT Goals PT Goal Formulation: With  patient Time For Goal Achievement: 01/03/17 Potential to Achieve Goals: Fair Progress towards PT goals: Progressing toward goals    Frequency    Min 3X/week      PT Plan Current plan remains appropriate    Co-evaluation             End of Session Equipment Utilized During Treatment: Gait belt Activity Tolerance: Patient tolerated treatment well Patient left: in bed;with call bell/phone within reach;Other (comment) (MD in room) Nurse Communication: Mobility status PT Visit Diagnosis: Other abnormalities of gait and mobility (R26.89)     Time: 1133-1150 PT Time Calculation (min) (ACUTE ONLY): 17 min  Charges:  $Gait Training: 8-22 mins                    G Codes:       [place], [place], [place] 12/21/2016, 12:20 PM

## 2016-12-21 NOTE — Telephone Encounter (Signed)
No LOS per 12/19/16 visit.

## 2016-12-22 DIAGNOSIS — Z1612 Extended spectrum beta lactamase (ESBL) resistance: Secondary | ICD-10-CM

## 2016-12-22 DIAGNOSIS — B9629 Other Escherichia coli [E. coli] as the cause of diseases classified elsewhere: Secondary | ICD-10-CM

## 2016-12-22 DIAGNOSIS — N390 Urinary tract infection, site not specified: Secondary | ICD-10-CM

## 2016-12-22 LAB — URINE CULTURE: Culture: >=100000 — AB

## 2016-12-22 LAB — BASIC METABOLIC PANEL
Anion gap: 8 (ref 5–15)
BUN: 48 mg/dL — AB (ref 6–20)
CHLORIDE: 106 mmol/L (ref 101–111)
CO2: 20 mmol/L — ABNORMAL LOW (ref 22–32)
CREATININE: 1.85 mg/dL — AB (ref 0.44–1.00)
Calcium: 8.1 mg/dL — ABNORMAL LOW (ref 8.9–10.3)
GFR calc Af Amer: 30 mL/min — ABNORMAL LOW (ref >60)
GFR, EST NON AFRICAN AMERICAN: 26 mL/min — AB (ref >60)
Glucose, Bld: 87 mg/dL (ref 65–99)
POTASSIUM: 5.3 mmol/L — AB (ref 3.5–5.1)
SODIUM: 134 mmol/L — AB (ref 135–145)

## 2016-12-22 LAB — CBC
HCT: 24.2 % — ABNORMAL LOW (ref 36.0–46.0)
HEMOGLOBIN: 7.9 g/dL — AB (ref 12.0–15.0)
MCH: 29.8 pg (ref 26.0–34.0)
MCHC: 32.6 g/dL (ref 30.0–36.0)
MCV: 91.3 fL (ref 78.0–100.0)
PLATELETS: 98 10*3/uL — AB (ref 150–400)
RBC: 2.65 MIL/uL — AB (ref 3.87–5.11)
RDW: 16.4 % — ABNORMAL HIGH (ref 11.5–15.5)
WBC: 7.3 10*3/uL (ref 4.0–10.5)

## 2016-12-22 MED ORDER — SODIUM CHLORIDE 0.9 % IV SOLN
INTRAVENOUS | Status: DC
  Administered 2016-12-22 (×2): via INTRAVENOUS

## 2016-12-22 NOTE — Progress Notes (Signed)
PROGRESS NOTE    Kelly Ray  MBT:[id number] DOB: 1940-11-07 DOA: 12/19/2016 PCP: [name], MD   Brief Narrative: 76 y.o. 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 The patient was seen at oncology clinic on the day of admission, who recommended admission in the hospital for further evaluation. Patient's oncologist is Dr. Learta Codding. Patient reported that she was treated with Bactrim for UTI recently. She still has foul-smelling urine.  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 As per neurologist possibilities include oncovin related neuropathy, rituxan related demyelinating neuropathy, carcinomatous meningitis, or less likely myelopathy. 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 Neurology and oncology evaluation ongoing.  -Status post lumbar puncture with normal results. Follow-up CSF cytology with oncologist. -Continue current medical and supportive care. -patient is clinically stable.   #History of high-grade diffuse large B-cell lymphoma status post recent chemotherapy: Follow-up oncology consult service. LDH not elevated.  #Hypotension: Blood pressure better controlled. Continue metoprolol and hold hyzaar.   #Acute kidney injury likely in the setting of UTI and dehydration. Continue to treat for UTI. US renal unremarkable.  - Serum creatinine level improving. Mild elevation in serum potassium level in the setting of oral and IV potassium supplement. Discontinued both. Repeat lab in the morning. Continue to monitor BMP. Avoid nephrotoxins.  #Mild hyponatremia and  hypokalemia: Change IV fluid to normal soon because of mild elevation in serum potassium level. Repeat lab in the morning.  #Urinary tract infection: Patient had recent UTI growing multiresistant Escherichia coli. Treated with bactrim. Patient admitted with symptomatic UTI. Urine culture growing ESBL Escherichia coli.Discussed with Dr. Johnnye Sima on 4/26, staring IV meropenem for at least 5 days.  -Discussed with Dr.Snider from infectious disease today. Plan to continue IV meropenem until tomorrow since patient is very eager to go home and then switch to fosfomycin on discharge for one dose. I discussed this with the patient and her husband at bedside. They verbalized understanding.  # Hypothyroidism: Continue Synthroid.  #Likely mild protein calorie malnutrition: Dietary referral.  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 Johnnye Sima on 4/26 and with Dr Baxter Flattery on 4/28.  Procedures: None Antimicrobials: IV meropenem since 4/26  Subjective: Patient was seen and examined at bedside. Denied headache, dizziness, nausea, vomiting, chest pain, shortness of breath. Reported urinary frequency has improved and there is no foul-smelling urine. Objective: Vitals:   12/22/16 0149 12/22/16 0528 12/22/16 0800 12/22/16 0931  BP: 115/72 105/60 (!) 113/51 100/64  Pulse: (!) 106 89 100 (!) 103  Resp: 18 16  18   Temp: 98.8 F (37.1 C)  98.2 F (36.8 C) 97.6 F (36.4 C)  TempSrc: Oral  Oral Oral  SpO2: 99% 99% 100% 98%  Weight:      Height:  Intake/Output Summary (Last 24  hours) at 12/22/16 0946 Last data filed at 12/22/16 0500  Gross per 24 hour  Intake             2415 ml  Output              475 ml  Net             1940 ml   Filed Weights   12/19/16 1419  Weight: 76.1 kg (167 lb 11.2 oz)    Examination:  General exam: Not in distress  Respiratory system: Clear bilateral. Respiratory effort normal. No wheezing or crackle Cardiovascular system: Regular rate rhythm, S1 and S2 normal. Gastrointestinal system: Abdomen is nondistended, soft and nontender. Normal bowel sounds heard. Central nervous system: Alert awake and following commands. Extremities: No pedal edema, muscle strength 5 over 5 Skin: No rashes, lesions or ulcers Psychiatry: Judgement and insight appear normal. Mood & affect appropriate.     Data Reviewed: I have personally reviewed following labs and imaging studies  CBC:  Recent Labs Lab 12/20/16 0424 12/21/16 0433 12/22/16 0429  WBC 5.3 5.3 7.3  HGB 8.1* 8.6* 7.9*  HCT 24.7* 25.5* 24.2*  MCV 91.1 91.4 91.3  PLT 114* 81* 98*   Basic Metabolic Panel:  Recent Labs Lab 12/19/16 1658 12/20/16 0424 12/21/16 0433 12/22/16 0429  NA 131* 132* 133* 134*  K 3.4* 3.4* 4.2 5.3*  CL 97* 99* 102 106  CO2 24 24 23  20*  GLUCOSE 138* 99 101* 87  BUN 35* 42* 50* 48*  CREATININE 1.96* 2.15* 2.29* 1.85*  CALCIUM 8.6* 8.0* 8.1* 8.1*   GFR: Estimated Creatinine Clearance: 26.1 mL/min (A) (by C-G formula based on SCr of 1.85 mg/dL (H)). Liver Function Tests:  Recent Labs Lab 12/19/16 1658 12/20/16 0424 12/21/16 0433  AST 17 15 25   ALT 13* 12* 15  ALKPHOS 67 76 202*  BILITOT 2.6* 2.3* 2.7*  PROT 5.4* 4.9* 4.4*  ALBUMIN 2.8* 2.5* 2.0*   No results for input(s): LIPASE, AMYLASE in the last 168 hours. No results for input(s): AMMONIA in the last 168 hours. Coagulation Profile:  Recent Labs Lab 12/19/16 1508  INR 1.42   Cardiac Enzymes: No results for input(s): CKTOTAL, CKMB, CKMBINDEX, TROPONINI in the last 168  hours. BNP (last 3 results) No results for input(s): PROBNP in the last 8760 hours. HbA1C: No results for input(s): HGBA1C in the last 72 hours. CBG: No results for input(s): GLUCAP in the last 168 hours. Lipid Profile: No results for input(s): CHOL, HDL, LDLCALC, TRIG, CHOLHDL, LDLDIRECT in the last 72 hours. Thyroid Function Tests: No results for input(s): TSH, T4TOTAL, FREET4, T3FREE, THYROIDAB in the last 72 hours. Anemia Panel:  Recent Labs  12/21/16 0433  RETICCTPCT 1.3   Sepsis Labs: No results for input(s): PROCALCITON, LATICACIDVEN in the last 168 hours.  Recent Results (from the past 240 hour(s))  Culture, Urine     Status: Abnormal   Collection Time: 12/20/16  7:00 AM  Result Value Ref Range Status   Specimen Description Urine  Final   Special Requests NONE  Final   Culture (A)  Final    >=100,000 COLONIES/mL ESCHERICHIA COLI Confirmed Extended Spectrum Beta-Lactamase Producer (ESBL)    Report Status 12/22/2016 FINAL  Final   Organism ID, Bacteria ESCHERICHIA COLI (A)  Final      Susceptibility   Escherichia coli - MIC*    AMPICILLIN >=32 RESISTANT Resistant     CEFAZOLIN >=64 RESISTANT Resistant  CEFEPIME RESISTANT Resistant     CEFTAZIDIME RESISTANT Resistant     CEFTRIAXONE >=64 RESISTANT Resistant     CIPROFLOXACIN >=4 RESISTANT Resistant     GENTAMICIN >=16 RESISTANT Resistant     IMIPENEM <=0.25 SENSITIVE Sensitive     TRIMETH/SULFA <=20 SENSITIVE Sensitive     AMPICILLIN/SULBACTAM >=32 RESISTANT Resistant     PIP/TAZO 8 SENSITIVE Sensitive     Extended ESBL POSITIVE Resistant     * >=100,000 COLONIES/mL ESCHERICHIA COLI         Radiology Studies: US Renal  Result Date: 12/21/2016 CLINICAL DATA:  Acute kidney injury EXAM: RENAL / URINARY TRACT ULTRASOUND COMPLETE COMPARISON:  10/24/2012 FINDINGS: Right Kidney: Length: 11.6 cm. Echogenicity within normal limits. No mass or hydronephrosis visualized. Left Kidney: Length: 12.2 cm. Echogenicity  within normal limits. No mass or hydronephrosis visualized. Bladder: Appears normal for degree of bladder distention. IMPRESSION: Negative renal ultrasound Electronically Signed   By: Donavan Foil M.D.   On: 12/21/2016 22:57        Scheduled Meds: . enoxaparin (LOVENOX) injection  30 mg Subcutaneous Q24H  . feeding supplement (ENSURE ENLIVE)  237 mL Oral BID BM  . levothyroxine  112 mcg Oral QAC breakfast  . metoprolol tartrate  25 mg Oral BID  . pravastatin  40 mg Oral QHS   Continuous Infusions: . sodium chloride 75 mL/hr at 12/22/16 0612  . meropenem (MERREM) IV Stopped (12/21/16 2223)     LOS: 3 days    [place]ron Tanna Furry, MD [facility] Pager [phone]  If 7PM-7AM, please contact night-coverage [email or web address] Password [facility] 12/22/2016, 9:46 AM

## 2016-12-23 LAB — BASIC METABOLIC PANEL
Anion gap: 6 (ref 5–15)
BUN: 44 mg/dL — ABNORMAL HIGH (ref 6–20)
CALCIUM: 7.9 mg/dL — AB (ref 8.9–10.3)
CO2: 20 mmol/L — AB (ref 22–32)
CREATININE: 1.66 mg/dL — AB (ref 0.44–1.00)
Chloride: 107 mmol/L (ref 101–111)
GFR calc non Af Amer: 29 mL/min — ABNORMAL LOW (ref >60)
GFR, EST AFRICAN AMERICAN: 34 mL/min — AB (ref >60)
Glucose, Bld: 97 mg/dL (ref 65–99)
Potassium: 4.6 mmol/L (ref 3.5–5.1)
SODIUM: 133 mmol/L — AB (ref 135–145)

## 2016-12-23 MED ORDER — FOSFOMYCIN TROMETHAMINE 3 G PO PACK
3.0000 g | PACK | Freq: Once | ORAL | Status: AC
  Administered 2016-12-23: 3 g via ORAL
  Filled 2016-12-23: qty 3

## 2016-12-23 MED ORDER — PRAVASTATIN SODIUM 40 MG PO TABS: 40.0000 mg | ORAL_TABLET | Freq: Every day | ORAL | 0 refills | Status: AC

## 2016-12-23 MED ORDER — FOSFOMYCIN TROMETHAMINE 3 G PO PACK: 3.0000 g | PACK | Freq: Once | ORAL | 0 refills | Status: DC

## 2016-12-23 MED ORDER — HEPARIN SOD (PORK) LOCK FLUSH 100 UNIT/ML IV SOLN
500.0000 [IU] | INTRAVENOUS | Status: AC | PRN
  Administered 2016-12-23: 500 [IU]

## 2016-12-23 NOTE — Care Management Note (Signed)
Case Management Note  Patient Details  Name: Kelly Ray MRN: [id number] Date of Birth: 1940-11-04  Subjective/Objective:                  UTI  Action/Plan: Discharge planning Expected Discharge Date:  12/23/16               Expected Discharge Plan:  [place]  In-House Referral:     Discharge planning Services  CM Consult  Post Acute Care Choice:  Home Health Choice offered to:  Patient, Spouse  DME Arranged:  N/A DME Agency:  NA  HH Arranged:  RN, PT, OT, Nurse's Aide [place] Agency:  [facility] (formerly [facility])  Status of Service:  Completed, signed off  If discussed at [employer], dates discussed:    Additional Comments: Cm met with pt in room to offer choice of home health agency and family choose [facility].  Referral called to [facility] rep, [place].  Family states they do NOT need any DME as they have rolling walker, cane, and 3n1 at home.  CM has spoken with Lyndee Leo, RN who states she WILL give fosfomycin to pt prior to discharge. No other CM needs were communicated.  Dellie Catholic, RN 12/23/2016, 10:50 AM

## 2016-12-23 NOTE — Discharge Summary (Addendum)
Physician Discharge Summary  Kelly Ray VZD:[id number] DOB: 1941/04/26 DOA: 12/19/2016  PCP: [name], MD  Admit date: 12/19/2016 Discharge date: 12/23/2016  Admitted From:home Disposition:home  Recommendations for Outpatient Follow-up:  1. Follow up with PCP in 1-2 weeks 2. 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 The patient was seen at oncology clinic on the day of admission, who recommended admission in the hospital for further evaluation. Patient's oncologist is Dr. Learta Codding. Patient reported that she was treated with Bactrim for UTI recently. She still has foul-smelling urine.  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 As per neurologist possibilities include oncovin related neuropathy, rituxan related demyelinating neuropathy, carcinomatous meningitis, or less likely myelopathy. 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   -Status post lumbar puncture with normal results. Follow-up CSF cytology with oncologist. -Patient is clinically improved. Able to ambulate with support. Neurology recommended outpatient follow-up for possible EMG. Outpatient referral to neurology made.  #History of high-grade diffuse large B-cell lymphoma status post recent chemotherapy: The patient will follow-up with oncology in next week. I recommended to repeat lab and follow up the pending lab results. They  verbalized understanding.  #Hypotension: Blood pressure better controlled. Continue to hold Hyzaar on discharge also because of renal failure. Continue metoprolol. Continue to monitor blood pressure and heart rate closely.   #Acute kidney injury likely in the setting of UTI and dehydration. Continue to treat for UTI. Ultrasound of kidneys unremarkable. Serum creatinine level trending down to 1.6 today. I recommended patient to repeat lab next week at oncology clinic. Verbalized understanding.  #Mild hyponatremia and hypokalemia: Monitor labs. Labs improved.  #Urinary tract infection: Patient had recent UTI growing multiresistant Escherichia coli. Treated with bactrim. Patient admitted with symptomatic UTI. Urine culture growing ESBL Escherichia coli.Discussed with Dr. Johnnye Sima on 4/26, started  IV meropenem.   -Discussed with Dr.Snider from infectious disease on 4/28. Plan to continue IV meropenem until today, will complete 4 days of IV antibiotics. Plan for one dose of fosfomycin today before discharge. This should cover for next 3 days. I discussed this with the patient and her husband at bedside.  Patient clinically improved.  # Hypothyroidism: Continue Synthroid.  #Likely mild protein calorie malnutrition: Dietary referral.  Patient clinically improved. Evaluated by neurology and oncology. Serum creatinine level improving. She will follow up with oncologist and neurologist outpatient. She will be discharged with oral antibiotics to complete a course of UTI treatment. Both patient and her husband verbalized understanding of follow-up instructions. Discharging with home care services.  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 (HCC)   Hypotension   Antineoplastic chemotherapy induced pancytopenia (CODE) (HCC)   Peripheral edema   Ataxia   Acute lower UTI   AKI (acute kidney injury) ([place])   UTI due to extended-spectrum  beta lactamase (ESBL) producing Escherichia coli    Discharge Instructions  Discharge Instructions    Ambulatory referral to Neurology    Complete by:  As directed    An appointment is requested  in approximately: 8 weeks   Call MD for:  difficulty breathing, headache or visual disturbances    Complete by:  As directed    Call MD for:  extreme fatigue    Complete by:  As directed    Call MD for:  hives    Complete by:  As directed    Call MD for:  persistant dizziness or light-headedness    Complete by:  As directed    Call MD for:  persistant nausea and vomiting    Complete by:  As directed    Call MD for:  severe uncontrolled pain    Complete by:  As directed    Call MD for:  temperature >100.4    Complete by:  As directed    Diet - low sodium heart healthy    Complete by:  As directed    Discharge instructions    Complete by:  As directed    Please check CBC, BMP and follow up CSF cytology at oncology clinic within a week.   Increase activity slowly    Complete by:  As directed      Allergies as of 12/23/2016      Reactions   Lisinopril Cough      Medication List    STOP taking these medications   lidocaine-prilocaine cream Commonly known as:  EMLA   losartan-hydrochlorothiazide 50-12.5 MG tablet Commonly known as:  HYZAAR   prochlorperazine 10 MG tablet Commonly known as:  COMPAZINE     TAKE these medications   levothyroxine 112 MCG tablet Commonly known as:  SYNTHROID, LEVOTHROID Take 1 tablet daily   metoprolol tartrate 25 MG tablet Commonly known as:  LOPRESSOR Take 1 tablet (25 mg total) by mouth 2 (two) times daily. What changed:  how much to take   pravastatin 40 MG tablet Commonly known as:  PRAVACHOL Take 1 tablet (40 mg total) by mouth at bedtime.            Durable Medical Equipment        Start     Ordered   12/23/16 3762  DME 3-in-1  Once     12/23/16 8315     Follow-up Information    DAUB, STEVE A, MD. Schedule an  appointment as soon as possible for a visit in 1 week(s).   Specialty:  Family Medicine Contact information: [place] [address] [phone]        Betsy Coder, MD. Schedule an appointment as soon as possible for a visit in 4 day(s).   Specialty:  Oncology Contact information: [address] [place] [address] [place] [facility] up.   Specialty:  [place] Why:  your home health agency will call you on Monday to schedule your at home therapies Contact information: [address] [place] [address] [phone]          Allergies  Allergen Reactions  . Lisinopril Cough    Consultations: Oncology Neurology  Phone consult with infectious disease Dr. Johnnye Sima on 4/26 and with Dr Baxter Flattery on 4/28.  Procedures/Studies: None  Subjective: Patient was seen and examined at bedside. Reported feeling much better. Denied headache, dizziness, fever, chills, nausea, vomiting, chest pain, shortness of breath.  Discharge Exam: Vitals:   12/23/16 0616 12/23/16 1033  BP: 134/76 120/71  Pulse: (!) 109 95  Resp: 20 18  Temp: 98.7 F (37.1 C) 98.9 F (37.2 C)  Vitals:   12/22/16 2125 12/23/16 0047 12/23/16 0616 12/23/16 1033  BP: 106/62 124/89 134/76 120/71  Pulse: (!) 104 (!) 106 (!) 109 95  Resp: 18 16 20 18   Temp: 99.2 F (37.3 C) 98.9 F (37.2 C) 98.7 F (37.1 C) 98.9 F (37.2 C)  TempSrc: Oral Oral Oral Oral  SpO2: 100% 99% 100% 100%  Weight:      Height:        General: Pt is alert, awake, not in acute distress Cardiovascular: RRR, S1/S2 +, no rubs, no gallops Respiratory: CTA bilaterally, no wheezing, no rhonchi Abdominal: Soft, NT, ND, bowel sounds + Extremities: no edema, no cyanosis    The results of significant diagnostics from this hospitalization (including imaging, microbiology, ancillary and laboratory) are listed below for reference.     Microbiology: Recent  Results (from the past 240 hour(s))  Culture, Urine     Status: Abnormal   Collection Time: 12/20/16  7:00 AM  Result Value Ref Range Status   Specimen Description Urine  Final   Special Requests NONE  Final   Culture (A)  Final    >=100,000 COLONIES/mL ESCHERICHIA COLI Confirmed Extended Spectrum Beta-Lactamase Producer (ESBL)    Report Status 12/22/2016 FINAL  Final   Organism ID, Bacteria ESCHERICHIA COLI (A)  Final      Susceptibility   Escherichia coli - MIC*    AMPICILLIN >=32 RESISTANT Resistant     CEFAZOLIN >=64 RESISTANT Resistant     CEFEPIME RESISTANT Resistant     CEFTAZIDIME RESISTANT Resistant     CEFTRIAXONE >=64 RESISTANT Resistant     CIPROFLOXACIN >=4 RESISTANT Resistant     GENTAMICIN >=16 RESISTANT Resistant     IMIPENEM <=0.25 SENSITIVE Sensitive     TRIMETH/SULFA <=20 SENSITIVE Sensitive     AMPICILLIN/SULBACTAM >=32 RESISTANT Resistant     PIP/TAZO 8 SENSITIVE Sensitive     Extended ESBL POSITIVE Resistant     * >=100,000 COLONIES/mL ESCHERICHIA COLI     Labs: BNP (last 3 results)  Recent Labs  11/26/16 1552  BNP 275.1*   Basic Metabolic Panel:  Recent Labs Lab 12/19/16 1658 12/20/16 0424 12/21/16 0433 12/22/16 0429 12/23/16 0356  NA 131* 132* 133* 134* 133*  K 3.4* 3.4* 4.2 5.3* 4.6  CL 97* 99* 102 106 107  CO2 24 24 23  20* 20*  GLUCOSE 138* 99 101* 87 97  BUN 35* 42* 50* 48* 44*  CREATININE 1.96* 2.15* 2.29* 1.85* 1.66*  CALCIUM 8.6* 8.0* 8.1* 8.1* 7.9*   Liver Function Tests:  Recent Labs Lab 12/19/16 1658 12/20/16 0424 12/21/16 0433  AST 17 15 25   ALT 13* 12* 15  ALKPHOS 67 76 202*  BILITOT 2.6* 2.3* 2.7*  PROT 5.4* 4.9* 4.4*  ALBUMIN 2.8* 2.5* 2.0*   No results for input(s): LIPASE, AMYLASE in the last 168 hours. No results for input(s): AMMONIA in the last 168 hours. CBC:  Recent Labs Lab 12/20/16 0424 12/21/16 0433 12/22/16 0429  WBC 5.3 5.3 7.3  HGB 8.1* 8.6* 7.9*  HCT 24.7* 25.5* 24.2*  MCV 91.1 91.4  91.3  PLT 114* 81* 98*   Cardiac Enzymes: No results for input(s): CKTOTAL, CKMB, CKMBINDEX, TROPONINI in the last 168 hours. BNP: Invalid input(s): POCBNP CBG: No results for input(s): GLUCAP in the last 168 hours. D-Dimer No results for input(s): DDIMER in the last 72 hours. Hgb A1c No results for input(s): HGBA1C in the last 72 hours. Lipid Profile No results for input(s): CHOL, HDL, LDLCALC, TRIG,  CHOLHDL, LDLDIRECT in the last 72 hours. Thyroid function studies No results for input(s): TSH, T4TOTAL, T3FREE, THYROIDAB in the last 72 hours.  Invalid input(s): FREET3 Anemia work up  Recent Labs  12/21/16 0433  RETICCTPCT 1.3   Urinalysis    Component Value Date/Time   COLORURINE AMBER (A) 12/19/2016 1806   APPEARANCEUR TURBID (A) 12/19/2016 1806   LABSPEC 1.015 12/19/2016 1806   LABSPEC 1.015 11/26/2016 1337   PHURINE 5.0 12/19/2016 1806   GLUCOSEU NEGATIVE 12/19/2016 1806   GLUCOSEU Negative 11/26/2016 1337   HGBUR SMALL (A) 12/19/2016 1806   BILIRUBINUR NEGATIVE 12/19/2016 1806   BILIRUBINUR Negative 11/26/2016 [place] 12/19/2016 1806   PROTEINUR 100 (A) 12/19/2016 1806   UROBILINOGEN 0.2 11/26/2016 1337   NITRITE NEGATIVE 12/19/2016 1806   LEUKOCYTESUR LARGE (A) 12/19/2016 1806   LEUKOCYTESUR Large 11/26/2016 1337   Sepsis Labs Invalid input(s): PROCALCITONIN,  WBC,  LACTICIDVEN Microbiology Recent Results (from the past 240 hour(s))  Culture, Urine     Status: Abnormal   Collection Time: 12/20/16  7:00 AM  Result Value Ref Range Status   Specimen Description Urine  Final   Special Requests NONE  Final   Culture (A)  Final    >=100,000 COLONIES/mL ESCHERICHIA COLI Confirmed Extended Spectrum Beta-Lactamase Producer (ESBL)    Report Status 12/22/2016 FINAL  Final   Organism ID, Bacteria ESCHERICHIA COLI (A)  Final      Susceptibility   Escherichia coli - MIC*    AMPICILLIN >=32 RESISTANT Resistant     CEFAZOLIN >=64 RESISTANT  Resistant     CEFEPIME RESISTANT Resistant     CEFTAZIDIME RESISTANT Resistant     CEFTRIAXONE >=64 RESISTANT Resistant     CIPROFLOXACIN >=4 RESISTANT Resistant     GENTAMICIN >=16 RESISTANT Resistant     IMIPENEM <=0.25 SENSITIVE Sensitive     TRIMETH/SULFA <=20 SENSITIVE Sensitive     AMPICILLIN/SULBACTAM >=32 RESISTANT Resistant     PIP/TAZO 8 SENSITIVE Sensitive     Extended ESBL POSITIVE Resistant     * >=100,000 COLONIES/mL ESCHERICHIA COLI     Time coordinating discharge: 30 minutes  SIGNED:   Rosita Fire, MD  [facility] 12/23/2016, 10:50 AM  If 7PM-7AM, please contact night-coverage [email or web address] Password [email or web address]

## 2016-12-23 NOTE — Progress Notes (Signed)
Pt is discharged from the unit with no signs of distress. Port is deaccessed. Pt verbalized discharged instructions.Telemetry has been d/c'd

## 2016-12-25 ENCOUNTER — Telehealth: Payer: Self-pay | Admitting: *Deleted

## 2016-12-25 ENCOUNTER — Telehealth: Payer: Self-pay | Admitting: Family Medicine

## 2016-12-25 NOTE — Telephone Encounter (Signed)
"  I was discharged from the hospital.  Dr.Sherrill told me to call to be seen by him within the next two days after discharge."    Call transferred to collaborative.  No pending scheduling messages found at this time.

## 2016-12-25 NOTE — Telephone Encounter (Signed)
Appointment added per MD instruction

## 2016-12-25 NOTE — Telephone Encounter (Signed)
l/m with ok

## 2016-12-25 NOTE — Telephone Encounter (Signed)
Per Dr Benay Spice, pt to be added to LT schedule 5/2 at 1115am. Pt has been advised of appointment date and time- confirms.

## 2016-12-25 NOTE — Telephone Encounter (Signed)
[facility] is calling to get the OK to see pt tomorrow.  They state that since the order was delayed they wanted to make sure it was OK for pt to be seen.  Please advise [phone]

## 2016-12-26 ENCOUNTER — Ambulatory Visit (HOSPITAL_BASED_OUTPATIENT_CLINIC_OR_DEPARTMENT_OTHER): Payer: Medicare Other | Admitting: Nurse Practitioner

## 2016-12-26 VITALS — BP 129/85 | HR 80 | Temp 98.0°F | Resp 18 | Ht 63.0 in | Wt 171.5 lb

## 2016-12-26 DIAGNOSIS — C8335 Diffuse large B-cell lymphoma, lymph nodes of inguinal region and lower limb: Secondary | ICD-10-CM

## 2016-12-26 DIAGNOSIS — G629 Polyneuropathy, unspecified: Secondary | ICD-10-CM

## 2016-12-26 DIAGNOSIS — N19 Unspecified kidney failure: Secondary | ICD-10-CM

## 2016-12-26 NOTE — Progress Notes (Addendum)
[facility] OFFICE PROGRESS NOTE   Diagnosis:  Non-Hodgkin's lymphoma  INTERVAL HISTORY:   Ms. Torpey returns for follow-up. She was hospitalized 12/19/2016 through 12/23/2016 with weakness, ataxia. She was evaluated by neurology and diagnosed with peripheral neuropathy.  She is feeling much better. She is no longer having lower leg pain. She does note bilateral knee pain. She feels stronger. No falls. She is ambulating with a walker. Her husband is always close by. No fevers or sweats. Appetite is better. No constipation or diarrhea. She notes leg swelling bilaterally.  Objective:  Vital signs in last 24 hours:  Blood pressure 129/85, pulse 80, temperature 98 F (36.7 C), temperature source Oral, resp. rate 18, height _0  (1.6 m), weight 171 lb 8 oz (77.8 kg), SpO2 100 %.    HEENT: No thrush or ulcers. Resp: Lungs clear bilaterally. Cardio: Regular rate and rhythm. GI: Abdomen soft and nontender. No organomegaly. Vascular: Trace lower leg edema bilaterally. Neuro: Alert and oriented. Decrease in proximal leg strength bilaterally. Motor strength otherwise is intact. Finger to nose intact.    Lab Results:  Lab Results  Component Value Date   WBC 7.3 12/22/2016   HGB 7.9 (L) 12/22/2016   HCT 24.2 (L) 12/22/2016   MCV 91.3 12/22/2016   PLT 98 (L) 12/22/2016   NEUTROABS 2.6 12/12/2016    Imaging:  No results found.  Medications: I have reviewed the patient's current medications.  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 Negative restaging CT evaluation 10/24/2012  Nodular skin lesions at the right lower leg, status post a shave biopsy 07/15/2013 confirming a malignant B-cell lymphoma, diffuse large cell positive for CD20, BCL 6, BCL 2, and CD10  Staging bone marrow biopsy 07/31/2013, negative for involvement with  lymphoma  Staging PET scan 07/30/2013-negative.   Status post palliative radiation right lower leg nodular skin lesions 08/17/2013 through 09/03/2013.New nodular skin lesions at the right lower leg April 2017, status postpsies of lesions at the right lower leg 12/07/2015 confirming large B-cell lymphoma, CD20 positive bio  staging PET scan 12/26/2015-hypermetabolic cutaneous and subcutaneous nodules in the right lower extremity, no other evidence of lymphoma  Cycle 1 bendamustine/Rituxan 01/03/2016  Cycle 2 bendamustine/Rituxan 01/31/2016  Clinical progression with enlargement of multiple right lower leg skin lesions 02/23/2016  Status post radiation right lower leg 03/14/2016 through 03/29/2016  Clinical progression with new and enlarging skin lesions right lower leg 07/06/2016  PET scan 07/12/2016-clear interval increase in size and metabolic activity of a large nodule posterior right calf and a smaller subcutaneous nodule medial right thigh; several more superficial right lower extremity lesions improved with reduction in size and metabolic activity; single focus of metabolic activity associated with the medial right clavicle with bonychange  Cycle 1 CHOP/Rituxan 07/17/2016  Cycle 2 CHOP/Rituxan 08/07/2016-Neulasta added  Cycle 3 CHOP/Rituxan 08/28/2016 with Neulasta support  Restaging PET scan 09/14/2016-hypermetabolic subcutaneous nodules previously demonstrated within the right thigh and right calf have nearly resolved. There is some residual activity within the calf which could be postsurgical. Small focus of activity within the left latissimus dorsi muscle within the posterior chest wall without corresponding CT abnormality.  Cycle 4 CHOP/Rituxan 09/18/2016 (Adriamycin deleted, etoposide substituted daily 3)  Cycle 5 CHOP/rituximab with etoposide 10/08/2016  Cycle 6 CHOP/rituximab with etoposide 10/29/2016 2. Port-A-Cath placement 06/01/2011. Port-A-Cath removal  11/04/2012 3. Hypertension. 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 5. Hypercholesterolemia. 6. Osteoarthritis. 7. Malaise-? Secondary to  progression of non-Hodgkin's lymphoma. Improved. 8. Staph infection right lower leg February 2015. 9. Right calf lesion with purulent drainage 02/09/2016, culture obtained-group B strep, doxycycline prescribed 10. Port-A-Cath placement 07/13/2016 11. 2-D echo 07/10/2016-estimated ejection fraction range of 55-60% 12. 07/17/2016 hepatitis Bsurface antigen negative, core antibody positive 13. Anemia/thrombocytopenia-persistent following final chemotherapy 10/29/2016 14. Paresthesias, leg weakness,  falls-negative brain CT 12/13/2016  Noncontrast brain MRI and MRIs cervical, thoracic, and lumbar spine negative on 12/19/2016  Status post lumbar puncture 12/20/2016; CSF negative for malignant cells 15. Urinary tract infection, Escherichia coli, status post antibiotics in the hospital 16. Renal failure-creatinine improved at 1.66 at time of discharge 12/23/2016    Disposition: Ms. Goel appears improved. Neurologic evaluation including MRIs and a lumbar puncture unrevealing as to the etiology of her symptoms. Question treatment related neurotoxicity. She is to follow-up with neurology for EMG testing. She will continue physical therapy.  She was unable to stay for a lab appointment today. She will return for labs on 12/28/2016.  She will return for a follow-up visit in 3 weeks. She will contact the office in the interim with further problems.  Patient seen with Dr. Benay Spice.    Ned Card ANP/GNP-BC   12/26/2016  12:03 PM  This was a shared visit with Ned Card. Ms. Vara has a much improved performance status. The etiology of the neurologic symptoms remains unclear. She will return for an office visit in 3 weeks.  Julieanne Manson, M.D.

## 2016-12-27 ENCOUNTER — Telehealth: Payer: Self-pay

## 2016-12-27 NOTE — Telephone Encounter (Signed)
04/2016 last ov with daub, they list [facility] as pcp, do you know this pt?

## 2016-12-27 NOTE — Telephone Encounter (Signed)
THIS MESSAGE IS FROM WES REYNOLDS (PHYSICAL THERAPIST) FROM [facility]. DAUB'S PREVIOUS PATIENT: HE WOULD LIKE TO GET A VERBAL ORDER FOR THE PATIENT TO HAVE 1 MORE PHYSICAL THERAPY VISIT THIS WEEK. THEN 3 TIMES A WEEK FOR 3 WEEKS. AND THEN TWICE A WEEK FOR 2 WEEKS. BEST PHONE [phone] ([place]) [place]

## 2016-12-28 ENCOUNTER — Encounter: Payer: Self-pay | Admitting: Family Medicine

## 2016-12-28 ENCOUNTER — Ambulatory Visit (INDEPENDENT_AMBULATORY_CARE_PROVIDER_SITE_OTHER): Payer: Medicare Other | Admitting: Family Medicine

## 2016-12-28 VITALS — BP 116/72 | HR 60 | Temp 98.4°F | Resp 16 | Ht 63.0 in | Wt 170.2 lb

## 2016-12-28 DIAGNOSIS — C8335 Diffuse large B-cell lymphoma, lymph nodes of inguinal region and lower limb: Secondary | ICD-10-CM | POA: Diagnosis not present

## 2016-12-28 DIAGNOSIS — I952 Hypotension due to drugs: Secondary | ICD-10-CM

## 2016-12-28 DIAGNOSIS — R270 Ataxia, unspecified: Secondary | ICD-10-CM

## 2016-12-28 DIAGNOSIS — N390 Urinary tract infection, site not specified: Secondary | ICD-10-CM | POA: Diagnosis not present

## 2016-12-28 DIAGNOSIS — D61810 Antineoplastic chemotherapy induced pancytopenia: Secondary | ICD-10-CM

## 2016-12-28 DIAGNOSIS — B9629 Other Escherichia coli [E. coli] as the cause of diseases classified elsewhere: Secondary | ICD-10-CM | POA: Diagnosis not present

## 2016-12-28 DIAGNOSIS — Z1612 Extended spectrum beta lactamase (ESBL) resistance: Secondary | ICD-10-CM

## 2016-12-28 DIAGNOSIS — N179 Acute kidney failure, unspecified: Secondary | ICD-10-CM

## 2016-12-28 DIAGNOSIS — R609 Edema, unspecified: Secondary | ICD-10-CM

## 2016-12-28 LAB — POCT URINALYSIS DIP (MANUAL ENTRY)
BILIRUBIN UA: NEGATIVE mg/dL
Bilirubin, UA: NEGATIVE
GLUCOSE UA: NEGATIVE mg/dL
Nitrite, UA: NEGATIVE
SPEC GRAV UA: 1.015 (ref 1.010–1.025)
Urobilinogen, UA: 0.2 E.U./dL
pH, UA: 5.5 (ref 5.0–8.0)

## 2016-12-28 LAB — POCT CBC
GRANULOCYTE PERCENT: 75.9 % (ref 37–80)
HCT, POC: 26.8 % — AB (ref 37.7–47.9)
Hemoglobin: 8.9 g/dL — AB (ref 12.2–16.2)
Lymph, poc: 1.5 (ref 0.6–3.4)
MCH, POC: 30.8 pg (ref 27–31.2)
MCHC: 33.3 g/dL (ref 31.8–35.4)
MCV: 92.3 fL (ref 80–97)
MID (CBC): 0.4 (ref 0–0.9)
MPV: 8.4 fL (ref 0–99.8)
POC GRANULOCYTE: 6.1 (ref 2–6.9)
POC LYMPH %: 18.9 % (ref 10–50)
POC MID %: 5.2 %M (ref 0–12)
Platelet Count, POC: 186 10*3/uL (ref 142–424)
RBC: 2.9 M/uL — AB (ref 4.04–5.48)
RDW, POC: 17.9 %
WBC: 8.1 10*3/uL (ref 4.6–10.2)

## 2016-12-28 LAB — POC MICROSCOPIC URINALYSIS (UMFC): MUCUS RE: ABSENT

## 2016-12-28 NOTE — Patient Instructions (Signed)
Please fill your antibiotic prescription because the urine has not cleared up yet

## 2016-12-28 NOTE — Progress Notes (Signed)
Chief Complaint  Patient presents with  . 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  She reports that her urine is no longer foul smelling Her dizziness is slightly better She continues to have some gait instability requiring her to use a walker all the time.  She followed up with Oncology and a referral was placed for Neurology.  LE Edema Her husband reports that she is much more alert. The patient states that she was very weak during her hospitalizations. She received liters and liters of IV fluids and now she is having swelling to the knee that feels uncomfortable.  She states that she doe snot know if she could take a medication to get the fluid off her legs  Past Medical History:  Diagnosis Date  . Anemia    SECONDARY TO CHEMOTHERAPY  . Arthritis    Osteoarthritis  . B-cell lymphoma ([place]) 05/04/11   Large B-cell lymphoma  . Cancer ([place])    NHL  . Hx of radiation therapy 08/17/13- 09/03/13   right lower extremity- lymphoma  . Hypertension   . S/P knee replacement    Left knee  . Thyroid disease    Hyperthyroidism    Current Outpatient Prescriptions  Medication Sig Dispense Refill  . levothyroxine (SYNTHROID, LEVOTHROID) 112 MCG tablet Take 1 tablet daily 90 tablet 3  . metoprolol tartrate (LOPRESSOR) 25 MG tablet Take 1 tablet (25 mg total) by mouth 2 (two) times daily. (Patient taking differently: Take 12.5 mg by mouth 2 (two) times daily. ) 180 tablet 3  . pravastatin (PRAVACHOL) 40 MG tablet Take 1 tablet (40 mg total) by mouth at bedtime. 30 tablet 0   No current facility-administered  medications for this visit.     Allergies:  Allergies  Allergen Reactions  . Lisinopril Cough    Past Surgical History:  Procedure Laterality Date  . IR GENERIC HISTORICAL  07/13/2016   IR US GUIDE VASC ACCESS RIGHT 07/13/2016 [facility]  . IR GENERIC HISTORICAL  07/13/2016   IR FLUORO GUIDE PORT INSERTION RIGHT 07/13/2016 [facility]  . NECK LESION BIOPSY Right 05/04/11   node biopsies  . TOTAL KNEE ARTHROPLASTY Left    x 2    Social History   Social History  . Marital status: Married    Spouse name: N/A  . Number of children: N/A  . Years of education: N/A   Social History Main Topics  . Smoking status: Never Smoker  . Smokeless tobacco: Never Used  . Alcohol use No  . Drug use: No  . Sexual activity: Not Asked   Other Topics Concern  . None   Social History Narrative  . None    Review of Systems  Constitutional: Negative for chills and fever.  Respiratory: Negative for cough, shortness of breath and wheezing.   Cardiovascular: Positive for leg swelling. Negative for chest pain and palpitations.  Gastrointestinal: Negative for abdominal pain, nausea and vomiting.  Genitourinary: Negative for dysuria, flank pain, frequency, hematuria and urgency.  Musculoskeletal: Negative for back pain.  Neurological: Negative for dizziness and headaches.    Objective:  Vitals:   12/28/16 1417  BP: 116/72  Pulse: 60  Resp: 16  Temp: 98.4 F (36.9 C)  TempSrc: Oral  SpO2: 93%  Weight: 170 lb 3.2 oz (77.2 kg)  Height: 5\' 3"  (1.6 m)   Wt Readings from Last 3 Encounters:  12/28/16 170 lb 3.2 oz (77.2 kg)  12/26/16 171 lb 8 oz (77.8 kg)  12/19/16 167 lb 11.2 oz (76.1 kg)    Physical Exam  Constitutional: She is oriented to person, place, and time. She appears well-developed and well-nourished.  HENT:  Head: Normocephalic and atraumatic.  Eyes: Conjunctivae and EOM are normal.  Cardiovascular: Normal rate, regular rhythm and normal heart sounds.     Pulmonary/Chest: Breath sounds normal. No respiratory distress. She has no wheezes.  Abdominal: Soft. Bowel sounds are normal. She exhibits no distension. There is no tenderness. There is no guarding.  No flank pain or suprapubic tenderness  Musculoskeletal: Normal range of motion. She exhibits edema.  Pitting edema to the knee bilaterally  Neurological: She is alert and oriented to person, place, and time.  Skin: Skin is warm. Capillary refill takes less than 2 seconds. No erythema.     Assessment and Plan Hurshel Keys was seen today for establish care.  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 ([place])- continue with Oncology  Acute lower UTI-  Symptoms improved, urine with wbcs  AKI (acute kidney injury) ([place])- creatinine and electrolytes improved -     Basic metabolic panel  UTI due to extended-spectrum beta lactamase (ESBL) producing Escherichia coli- urine without nitrites but showing concerning changes such as wbc Advised pt to fill the fosfomycin prescription and discussed that she should take for additional coverage By the time her next oncologist office visit she should be ready for repeat UTI -     POCT urinalysis dipstick -     POCT Microscopic Urinalysis ([facility])  Antineoplastic chemotherapy induced pancytopenia (CODE) ([place])- hemoglobin and plt improving -     POCT CBC  Ataxia- follow up with Neurology and PT as ordered  Peripheral edema- advised elevation, compression stockings Would not use lasix as pt is just recovering from AKI and hypotension       Laterria Lasota A Nolon Rod

## 2016-12-29 LAB — BASIC METABOLIC PANEL
BUN/Creatinine Ratio: 16 (ref 12–28)
BUN: 16 mg/dL (ref 8–27)
CO2: 25 mmol/L (ref 18–29)
CREATININE: 1.01 mg/dL — AB (ref 0.57–1.00)
Calcium: 8.3 mg/dL — ABNORMAL LOW (ref 8.7–10.3)
Chloride: 99 mmol/L (ref 96–106)
GFR calc Af Amer: 63 mL/min/{1.73_m2} (ref >59)
GFR calc non Af Amer: 55 mL/min/{1.73_m2} — ABNORMAL LOW (ref >59)
GLUCOSE: 96 mg/dL (ref 65–99)
Potassium: 4.5 mmol/L (ref 3.5–5.2)
SODIUM: 139 mmol/L (ref 134–144)

## 2016-12-31 ENCOUNTER — Telehealth: Payer: Self-pay

## 2016-12-31 ENCOUNTER — Telehealth: Payer: Self-pay | Admitting: Family Medicine

## 2016-12-31 MED ORDER — GABAPENTIN 300 MG PO CAPS: 300.0000 mg | ORAL_CAPSULE | Freq: Two times a day (BID) | ORAL | 0 refills | Status: DC

## 2016-12-31 NOTE — Telephone Encounter (Signed)
PATIENT STATES SHE SAW DR. Nolon Rod ON Friday AND SHE DID SOME LAB WORK ON HER. SHE SAID SHE WAS TOLD SHE WOULD GET A CALL TODAY (12/31/16) REGARDING THE RESULTS. BEST PHONE [phone] (HOME) [facility] CHOICE [facility] [address] [place]

## 2016-12-31 NOTE — Telephone Encounter (Signed)
Pt called and kept saying she wanted to talk with Dr Benay Spice. She was unwilling to leave a message. She was willing to leave a message on Tony's voice mail.

## 2016-12-31 NOTE — Telephone Encounter (Signed)
Discussed pt's call with Dr. Benay Spice: Try Gabapentin to see if this helps. Order received for 300 mg BID. [facility]. Instructions reviewed, pt voiced understanding.

## 2016-12-31 NOTE — Telephone Encounter (Signed)
Please see results and advised

## 2016-12-31 NOTE — Telephone Encounter (Signed)
Returned call to pt, she reports she took another dose of fosfomycin for urinary symptoms. Pt also reports BLE pain from knees down. Has bilateral swelling in feet and ankles. Pt asks if there is anything she can take for the leg pain. Heat helps. Pt cautioned about risk for burns with her neuropathy.

## 2016-12-31 NOTE — Telephone Encounter (Signed)
I have seen her this May. Please give a verbal order.

## 2017-01-01 ENCOUNTER — Telehealth: Payer: Self-pay | Admitting: Family Medicine

## 2017-01-01 NOTE — Telephone Encounter (Signed)
Pt is looking for lab results  Best number [phone]

## 2017-01-01 NOTE — Telephone Encounter (Signed)
See abnormal results

## 2017-01-01 NOTE — Telephone Encounter (Signed)
l/m with verbal ok 

## 2017-01-02 ENCOUNTER — Telehealth: Payer: Self-pay | Admitting: Oncology

## 2017-01-02 NOTE — Telephone Encounter (Signed)
Called patient to inform her of next scheduled appointments for 5/23. Patient aware.

## 2017-01-02 NOTE — Telephone Encounter (Signed)
PT CALLING FOR LAB RESULTS

## 2017-01-04 NOTE — Telephone Encounter (Signed)
Please let her know that she should finish the Monurol.  Her symptoms may take up to two weeks to resolve and her urine might take about the same time to clear. I do not know of a stonger medication that can be used as an outpatient. Everything else would be a weaker antibiotics. Let her know if she is having worse symptoms she needs an appointment.  I have been communicating her results through mychart.

## 2017-01-04 NOTE — Telephone Encounter (Signed)
Monurol is the med she is on now last dose last week Sunday. She feels she needs another antibiotic since urine is still not cleared based on results. Also advise on other abn.

## 2017-01-04 NOTE — Telephone Encounter (Signed)
Cell # is d/c Home # is good Pt advised

## 2017-01-04 NOTE — Telephone Encounter (Signed)
If she continues to have symptoms, she needs to return for re-evaluation.

## 2017-01-04 NOTE — Telephone Encounter (Signed)
She finished the monurol last week sunday

## 2017-01-08 ENCOUNTER — Telehealth: Payer: Self-pay

## 2017-01-08 ENCOUNTER — Telehealth: Payer: Self-pay | Admitting: *Deleted

## 2017-01-08 MED ORDER — GABAPENTIN 300 MG PO CAPS: 300.0000 mg | ORAL_CAPSULE | Freq: Every day | ORAL | 0 refills | Status: DC

## 2017-01-08 NOTE — Telephone Encounter (Signed)
A new medicine a week ago for her legs.  The medicine is the neurontin 300 BID. The problem is the med is making her dizzy and wobbly and afraid to take them any more. This began in the last 4 days.  It is helping her legs.  She has not taken it today.   Instructed her to not take gabapentin until she hears from Dr Benay Spice.

## 2017-01-08 NOTE — Telephone Encounter (Signed)
Call placed to patient to notify her per order of Dr. Benay Spice to decrease Neurontin dose to 100 mg by mouth daily and that if she continues to feel dizzy to stop the medication.  Patient states that she would like to try to take 300 mg at bedtime only before decreasing dose.  Dr. Benay Spice notified and agrees with that plan.  Patient appreciative of call back and has further questions or concerns at this time.

## 2017-01-09 ENCOUNTER — Telehealth: Payer: Self-pay | Admitting: *Deleted

## 2017-01-09 DIAGNOSIS — C8335 Diffuse large B-cell lymphoma, lymph nodes of inguinal region and lower limb: Secondary | ICD-10-CM

## 2017-01-09 DIAGNOSIS — R531 Weakness: Secondary | ICD-10-CM

## 2017-01-09 NOTE — Telephone Encounter (Signed)
Per Dr. Benay Spice pt to come in 5/17 for CBC only. Pt has been advised of appointment and confirms time. She understands to wait for results and the desk nurse will come and talk to her.

## 2017-01-09 NOTE — Addendum Note (Signed)
Addended by: Cherylynn Ridges on: 01/09/2017 05:42 PM   Modules accepted: Orders

## 2017-01-09 NOTE — Telephone Encounter (Signed)
"  I'm having dizziness, balance problems, not feeling good.  I'm wobbly.  Today physical therapist checked B/P.  Syst = 87 to 89, Diast = 50 to 60.  This is not different than when I check it.  I've been on metoprolol a long time for fast heart rate.  I sleep well.  I am eating and drinking enough.  I sleep well.  No N/V, diarrhea, bleeding or bruising.  Is there anything Dr.Sherrill thinks I need to do.  He gave me the Neurontin 300 mg to take at bedtime instead of through the day and I do not feel any better.  My legs still hurt."  Nurse instructions, increase fluid intake and FF, be careful, safe getting bearings before moving using cane or walker at all times.  Stop the Neurontin is what was recommended yesterday if still not feeling better.  Will notify provider.

## 2017-01-09 NOTE — Telephone Encounter (Signed)
Returned call to pt she reports memory loss x1 week. Feels "weak and wobbly." Reports she is eating/drinking and sleeping well.  Pt reports she has discontinued Gabapentin due to dizziness and weakness.

## 2017-01-09 NOTE — Telephone Encounter (Signed)
Check orthostatic bp with lab on 5/17 Also check bmet and ldh

## 2017-01-09 NOTE — Telephone Encounter (Signed)
Orders entered and [facility] visit requested.

## 2017-01-10 ENCOUNTER — Ambulatory Visit: Payer: Medicare Other

## 2017-01-10 ENCOUNTER — Other Ambulatory Visit: Payer: Self-pay | Admitting: *Deleted

## 2017-01-10 ENCOUNTER — Telehealth: Payer: Self-pay | Admitting: *Deleted

## 2017-01-10 ENCOUNTER — Other Ambulatory Visit (HOSPITAL_BASED_OUTPATIENT_CLINIC_OR_DEPARTMENT_OTHER): Payer: Medicare Other

## 2017-01-10 ENCOUNTER — Ambulatory Visit (HOSPITAL_COMMUNITY)
Admission: RE | Admit: 2017-01-10 | Discharge: 2017-01-10 | Disposition: A | Discharge status: Home / Self Care | Payer: Medicare Other | Source: Ambulatory Visit | Attending: Oncology | Admitting: Oncology

## 2017-01-10 VITALS — BP 117/57 | HR 81

## 2017-01-10 DIAGNOSIS — C8335 Diffuse large B-cell lymphoma, lymph nodes of inguinal region and lower limb: Secondary | ICD-10-CM

## 2017-01-10 DIAGNOSIS — C8330 Diffuse large B-cell lymphoma, unspecified site: Secondary | ICD-10-CM

## 2017-01-10 DIAGNOSIS — D649 Anemia, unspecified: Secondary | ICD-10-CM

## 2017-01-10 DIAGNOSIS — R531 Weakness: Secondary | ICD-10-CM

## 2017-01-10 LAB — URINALYSIS, MICROSCOPIC - CHCC
BILIRUBIN (URINE): NEGATIVE
Glucose: NEGATIVE mg/dL
KETONES: NEGATIVE mg/dL
NITRITE: POSITIVE
Protein: 100 mg/dL
SPECIFIC GRAVITY, URINE: 1.01 (ref 1.003–1.035)
Urobilinogen, UR: 0.2 mg/dL (ref 0.2–1)
pH: 7 (ref 4.6–8.0)

## 2017-01-10 LAB — BASIC METABOLIC PANEL
ANION GAP: 8 meq/L (ref 3–11)
BUN: 17.6 mg/dL (ref 7.0–26.0)
CO2: 29 mEq/L (ref 22–29)
Calcium: 9.1 mg/dL (ref 8.4–10.4)
Chloride: 102 mEq/L (ref 98–109)
Creatinine: 1.1 mg/dL (ref 0.6–1.1)
EGFR: 49 mL/min/{1.73_m2} — AB (ref >90)
Glucose: 120 mg/dl (ref 70–140)
POTASSIUM: 4.5 meq/L (ref 3.5–5.1)
SODIUM: 139 meq/L (ref 136–145)

## 2017-01-10 LAB — CBC WITH DIFFERENTIAL/PLATELET
BASO%: 0.1 % (ref 0.0–2.0)
Basophils Absolute: 0 10*3/uL (ref 0.0–0.1)
EOS%: 0.2 % (ref 0.0–7.0)
Eosinophils Absolute: 0 10*3/uL (ref 0.0–0.5)
HEMATOCRIT: 26 % — AB (ref 34.8–46.6)
HGB: 8.1 g/dL — ABNORMAL LOW (ref 11.6–15.9)
LYMPH#: 1.5 10*3/uL (ref 0.9–3.3)
LYMPH%: 17.6 % (ref 14.0–49.7)
MCH: 30.3 pg (ref 25.1–34.0)
MCHC: 31.2 g/dL — AB (ref 31.5–36.0)
MCV: 97.4 fL (ref 79.5–101.0)
MONO#: 0.8 10*3/uL (ref 0.1–0.9)
MONO%: 9.3 % (ref 0.0–14.0)
NEUT#: 6.1 10*3/uL (ref 1.5–6.5)
NEUT%: 72.8 % (ref 38.4–76.8)
Platelets: 211 10*3/uL (ref 145–400)
RBC: 2.67 10*6/uL — AB (ref 3.70–5.45)
RDW: 18 % — ABNORMAL HIGH (ref 11.2–14.5)
WBC: 8.3 10*3/uL (ref 3.9–10.3)

## 2017-01-10 LAB — TECHNOLOGIST REVIEW

## 2017-01-10 LAB — PREPARE RBC (CROSSMATCH)

## 2017-01-10 LAB — LACTATE DEHYDROGENASE: LDH: 202 U/L (ref 125–245)

## 2017-01-10 NOTE — Telephone Encounter (Signed)
Spoke with pt in lobby, she still feels "heavy headed" but not as weak as she had been. She turned in a urine specimen, wonders if these symptoms may be related to infection. Reviewed with Dr. Benay Spice, order entered for UA.  CBC reviewed by Dr. Benay Spice: Order received for transfusion. Scheduled for 5/18 @ 0730. Pt instructed to keep blood bracelet intact.

## 2017-01-11 ENCOUNTER — Ambulatory Visit (HOSPITAL_BASED_OUTPATIENT_CLINIC_OR_DEPARTMENT_OTHER): Payer: Medicare Other

## 2017-01-11 DIAGNOSIS — D649 Anemia, unspecified: Secondary | ICD-10-CM | POA: Diagnosis not present

## 2017-01-11 MED ORDER — ACETAMINOPHEN 325 MG PO TABS
ORAL_TABLET | ORAL | Status: AC
Start: 2017-01-11 — End: 2017-01-11
  Filled 2017-01-11: qty 2

## 2017-01-11 MED ORDER — SODIUM CHLORIDE 0.9 % IV SOLN
250.0000 mL | Freq: Once | INTRAVENOUS | Status: AC
  Administered 2017-01-11: 250 mL via INTRAVENOUS

## 2017-01-11 MED ORDER — DIPHENHYDRAMINE HCL 25 MG PO CAPS
25.0000 mg | ORAL_CAPSULE | Freq: Once | ORAL | Status: AC
  Administered 2017-01-11: 25 mg via ORAL

## 2017-01-11 MED ORDER — DIPHENHYDRAMINE HCL 25 MG PO CAPS
ORAL_CAPSULE | ORAL | Status: AC
  Filled 2017-01-11: qty 1

## 2017-01-11 MED ORDER — HEPARIN SOD (PORK) LOCK FLUSH 100 UNIT/ML IV SOLN
500.0000 [IU] | Freq: Every day | INTRAVENOUS | Status: AC | PRN
Start: 2017-01-11 — End: 2017-01-11
  Administered 2017-01-11: 500 [IU]
  Filled 2017-01-11: qty 5

## 2017-01-11 MED ORDER — SODIUM CHLORIDE 0.9% FLUSH
10.0000 mL | INTRAVENOUS | Status: AC | PRN
  Administered 2017-01-11: 10 mL
  Filled 2017-01-11: qty 10

## 2017-01-11 MED ORDER — SULFAMETHOXAZOLE-TRIMETHOPRIM 800-160 MG PO TABS: 1.0000 | ORAL_TABLET | Freq: Two times a day (BID) | ORAL | 0 refills | Status: DC

## 2017-01-11 MED ORDER — ACETAMINOPHEN 325 MG PO TABS
650.0000 mg | ORAL_TABLET | Freq: Once | ORAL | Status: AC
  Administered 2017-01-11: 650 mg via ORAL

## 2017-01-11 NOTE — Progress Notes (Signed)
Per Lavella Lemons RN pt to pick up Bactrim from pharmacy and take twice a day for 7 days. Pt aware and verbalizes understanding. Pt and VS stable at discharge.

## 2017-01-11 NOTE — Progress Notes (Signed)
Script for Bactrim sent to pharmacy, per Dr. Benay Spice. Urinalysis positive for infection. Amy, Infusion RN will make patient aware.

## 2017-01-11 NOTE — Patient Instructions (Signed)
Blood Transfusion , Adult A blood transfusion is a procedure in which you receive donated blood, including plasma, platelets, and red blood cells, through an IV tube. You may need a blood transfusion because of illness, surgery, or injury. The blood may come from a donor. You may also be able to donate blood for yourself (autologous blood donation) before a surgery if you know that you might require a blood transfusion. The blood given in a transfusion is made up of different types of cells. You may receive:  Red blood cells. These carry oxygen to the cells in the body.  White blood cells. These help you fight infections.  Platelets. These help your blood to clot.  Plasma. This is the liquid part of your blood and it helps with fluid imbalances. If you have hemophilia or another clotting disorder, you may also receive other types of blood products. Tell a health care provider about:  Any allergies you have.  All medicines you are taking, including vitamins, herbs, eye drops, creams, and over-the-counter medicines.  Any problems you or family members have had with anesthetic medicines.  Any blood disorders you have.  Any surgeries you have had.  Any medical conditions you have, including any recent fever or cold symptoms.  Whether you are pregnant or may be pregnant.  Any previous reactions you have had during a blood transfusion. What are the risks? Generally, this is a safe procedure. However, problems may occur, including:  Having an allergic reaction to something in the donated blood. Hives and itching may be symptoms of this type of reaction.  Fever. This may be a reaction to the white blood cells in the transfused blood. Nausea or chest pain may accompany a fever.  Iron overload. This can happen from having many transfusions.  Transfusion-related acute lung injury (TRALI). This is a rare reaction that causes lung damage. The cause is not known.TRALI can occur within hours  of a transfusion or several days later.  Sudden (acute) or delayed hemolytic reactions. This happens if your blood does not match the cells in your transfusion. Your body's defense system (immune system) may try to attack the new cells. This complication is rare. The symptoms include fever, chills, nausea, and low back pain or chest pain.  Infection or disease transmission. This is rare. What happens before the procedure?  You will have a blood test to determine your blood type. This is necessary to know what kind of blood your body will accept and to match it to the donor blood.  If you are going to have a planned surgery, you may be able to do an autologous blood donation. This may be done in case you need to have a transfusion.  If you have had an allergic reaction to a transfusion in the past, you may be given medicine to help prevent a reaction. This medicine may be given to you by mouth or through an IV tube.  You will have your temperature, blood pressure, and pulse monitored before the transfusion.  Follow instructions from your health care provider about eating and drinking restrictions.  Ask your health care provider about:  Changing or stopping your regular medicines. This is especially important if you are taking diabetes medicines or blood thinners.  Taking medicines such as aspirin and ibuprofen. These medicines can thin your blood. Do not take these medicines before your procedure if your health care provider instructs you not to. What happens during the procedure?  An IV tube will be   inserted into one of your veins.  The bag of donated blood will be attached to your IV tube. The blood will then enter through your vein.  Your temperature, blood pressure, and pulse will be monitored regularly during the transfusion. This monitoring is done to detect early signs of a transfusion reaction.  If you have any signs or symptoms of a reaction, your transfusion will be stopped and  you may be given medicine.  When the transfusion is complete, your IV tube will be removed.  Pressure may be applied to the IV site for a few minutes.  A bandage (dressing) will be applied. The procedure may vary among health care providers and hospitals. What happens after the procedure?  Your temperature, blood pressure, heart rate, breathing rate, and blood oxygen level will be monitored often.  Your blood may be tested to see how you are responding to the transfusion.  You may be warmed with fluids or blankets to maintain a normal body temperature. Summary  A blood transfusion is a procedure in which you receive donated blood, including plasma, platelets, and red blood cells, through an IV tube.  Your temperature, blood pressure, and pulse will be monitored before, during, and after the transfusion.  Your blood may be tested after the transfusion to see how your body has responded. This information is not intended to replace advice given to you by your health care provider. Make sure you discuss any questions you have with your health care provider. Document Released: 08/10/2000 Document Revised: 05/10/2016 Document Reviewed: 05/10/2016 Elsevier Interactive Patient Education  2017 [employer]  

## 2017-01-12 LAB — TYPE AND SCREEN
ABO/RH(D): A POS
Antibody Screen: NEGATIVE
UNIT DIVISION: 0
Unit division: 0

## 2017-01-12 LAB — BPAM RBC
BLOOD PRODUCT EXPIRATION DATE: 201806012359
Blood Product Expiration Date: 201806012359
ISSUE DATE / TIME: 201805180805
ISSUE DATE / TIME: 201805180805
UNIT TYPE AND RH: 6200
Unit Type and Rh: 6200

## 2017-01-12 LAB — URINE CULTURE

## 2017-01-14 ENCOUNTER — Telehealth: Payer: Self-pay | Admitting: *Deleted

## 2017-01-14 NOTE — Telephone Encounter (Signed)
Call from pt requesting urine culture result. Informed her it shows sensitivity to Bactrim, continue BID to complete 7 day course. She voiced understanding. Reports feeling extremely tired, didn't notice much improvement after transfusion. Husband is concerned about memory loss. Sees neurology 6/27. 5/23 appt confirmed.

## 2017-01-16 ENCOUNTER — Ambulatory Visit (HOSPITAL_BASED_OUTPATIENT_CLINIC_OR_DEPARTMENT_OTHER): Payer: Medicare Other

## 2017-01-16 ENCOUNTER — Other Ambulatory Visit (HOSPITAL_BASED_OUTPATIENT_CLINIC_OR_DEPARTMENT_OTHER): Payer: Medicare Other

## 2017-01-16 ENCOUNTER — Ambulatory Visit (HOSPITAL_BASED_OUTPATIENT_CLINIC_OR_DEPARTMENT_OTHER): Payer: Medicare Other | Admitting: Oncology

## 2017-01-16 ENCOUNTER — Telehealth: Payer: Self-pay | Admitting: Oncology

## 2017-01-16 VITALS — BP 127/56 | HR 71 | Temp 97.7°F | Resp 18 | Ht 63.0 in | Wt 163.3 lb

## 2017-01-16 DIAGNOSIS — D649 Anemia, unspecified: Secondary | ICD-10-CM

## 2017-01-16 DIAGNOSIS — R630 Anorexia: Secondary | ICD-10-CM

## 2017-01-16 DIAGNOSIS — C8330 Diffuse large B-cell lymphoma, unspecified site: Secondary | ICD-10-CM

## 2017-01-16 DIAGNOSIS — M1990 Unspecified osteoarthritis, unspecified site: Secondary | ICD-10-CM | POA: Diagnosis not present

## 2017-01-16 DIAGNOSIS — N390 Urinary tract infection, site not specified: Secondary | ICD-10-CM | POA: Diagnosis not present

## 2017-01-16 DIAGNOSIS — I10 Essential (primary) hypertension: Secondary | ICD-10-CM

## 2017-01-16 DIAGNOSIS — C8335 Diffuse large B-cell lymphoma, lymph nodes of inguinal region and lower limb: Secondary | ICD-10-CM

## 2017-01-16 DIAGNOSIS — E7800 Pure hypercholesterolemia, unspecified: Secondary | ICD-10-CM | POA: Diagnosis not present

## 2017-01-16 DIAGNOSIS — Z95828 Presence of other vascular implants and grafts: Secondary | ICD-10-CM

## 2017-01-16 DIAGNOSIS — E039 Hypothyroidism, unspecified: Secondary | ICD-10-CM

## 2017-01-16 LAB — LACTATE DEHYDROGENASE: LDH: 222 U/L (ref 125–245)

## 2017-01-16 LAB — CBC WITH DIFFERENTIAL/PLATELET
BASO%: 0.2 % (ref 0.0–2.0)
BASOS ABS: 0 10*3/uL (ref 0.0–0.1)
EOS ABS: 0.1 10*3/uL (ref 0.0–0.5)
EOS%: 0.6 % (ref 0.0–7.0)
HEMATOCRIT: 30.6 % — AB (ref 34.8–46.6)
HGB: 9.9 g/dL — ABNORMAL LOW (ref 11.6–15.9)
LYMPH#: 1.2 10*3/uL (ref 0.9–3.3)
LYMPH%: 13.5 % — ABNORMAL LOW (ref 14.0–49.7)
MCH: 28.7 pg (ref 25.1–34.0)
MCHC: 32.4 g/dL (ref 31.5–36.0)
MCV: 88.7 fL (ref 79.5–101.0)
MONO#: 0.7 10*3/uL (ref 0.1–0.9)
MONO%: 7.8 % (ref 0.0–14.0)
NEUT#: 7.1 10*3/uL — ABNORMAL HIGH (ref 1.5–6.5)
NEUT%: 77.9 % — AB (ref 38.4–76.8)
PLATELETS: 194 10*3/uL (ref 145–400)
RBC: 3.45 10*6/uL — ABNORMAL LOW (ref 3.70–5.45)
RDW: 19.4 % — ABNORMAL HIGH (ref 11.2–14.5)
WBC: 9.1 10*3/uL (ref 3.9–10.3)

## 2017-01-16 LAB — COMPREHENSIVE METABOLIC PANEL
ALK PHOS: 112 U/L (ref 40–150)
ALT: 15 U/L (ref 0–55)
ANION GAP: 11 meq/L (ref 3–11)
AST: 20 U/L (ref 5–34)
Albumin: 2.9 g/dL — ABNORMAL LOW (ref 3.5–5.0)
BILIRUBIN TOTAL: 0.6 mg/dL (ref 0.20–1.20)
BUN: 18.9 mg/dL (ref 7.0–26.0)
CALCIUM: 8.9 mg/dL (ref 8.4–10.4)
CHLORIDE: 103 meq/L (ref 98–109)
CO2: 23 mEq/L (ref 22–29)
CREATININE: 1.2 mg/dL — AB (ref 0.6–1.1)
EGFR: 44 mL/min/{1.73_m2} — ABNORMAL LOW (ref >90)
Glucose: 92 mg/dl (ref 70–140)
Potassium: 4.3 mEq/L (ref 3.5–5.1)
Sodium: 137 mEq/L (ref 136–145)
Total Protein: 5.7 g/dL — ABNORMAL LOW (ref 6.4–8.3)

## 2017-01-16 LAB — TECHNOLOGIST REVIEW

## 2017-01-16 MED ORDER — HEPARIN SOD (PORK) LOCK FLUSH 100 UNIT/ML IV SOLN
500.0000 [IU] | Freq: Once | INTRAVENOUS | Status: AC | PRN
  Administered 2017-01-16: 500 [IU] via INTRAVENOUS
  Filled 2017-01-16: qty 5

## 2017-01-16 MED ORDER — SODIUM CHLORIDE 0.9% FLUSH
10.0000 mL | INTRAVENOUS | Status: DC | PRN
  Administered 2017-01-16: 10 mL via INTRAVENOUS
  Filled 2017-01-16: qty 10

## 2017-01-16 NOTE — Progress Notes (Signed)
[facility] OFFICE PROGRESS NOTE   Diagnosis: Non-Hodgkin's lymphoma  INTERVAL HISTORY:   Kelly Ray returns as scheduled. She began a trial of gabapentin 4 leg pain. This caused somnolence and dizziness. She is not taking gabapentin. The leg pain has improved. She continues to have anorexia and altered taste. Her husband reports that her memory has improved over the past few days. She has no recollection of the hospital admission last month. No fever.  Objective:  Vital signs in last 24 hours:  Blood pressure (!) 127/56, pulse 71, temperature 97.7 F (36.5 C), temperature source Oral, resp. rate 18, height '5\' 3"'$  (1.6 m), weight 163 lb 4.8 oz (74.1 kg), SpO2 100 %.    HEENT: No thrush, angular keloid [place] Resp: Lungs with scattered wheeze/bronchial sounds that cleared after several respirations Cardio: Regular rate and rhythm GI: Nontender, no hepatosplenomegaly Vascular: No leg edema Neurologic: Mild weakness with flexion at the left hip. The neurologic exam otherwise appears intact in the upper and lower extremities. She is alert and oriented.  Skin: Healed biopsy sites at the lower extremities. No evidence of progressive lymphoma.    Lab Results:  Lab Results  Component Value Date   WBC 9.1 01/16/2017   HGB 9.9 (L) 01/16/2017   HCT 30.6 (L) 01/16/2017   MCV 88.7 01/16/2017   PLT 194 01/16/2017   NEUTROABS 7.1 (H) 01/16/2017    CMP     Component Value Date/Time   NA 137 01/16/2017 0931   K 4.3 01/16/2017 0931   CL 99 12/28/2016 1457   CL 103 10/24/2012 0859   CO2 23 01/16/2017 0931   GLUCOSE 92 01/16/2017 0931   GLUCOSE 97 10/24/2012 0859   BUN 18.9 01/16/2017 0931   CREATININE 1.2 (H) 01/16/2017 0931   CALCIUM 8.9 01/16/2017 0931   PROT 5.7 (L) 01/16/2017 0931   ALBUMIN 2.9 (L) 01/16/2017 0931   AST 20 01/16/2017 0931   ALT 15 01/16/2017 0931   ALKPHOS 112 01/16/2017 0931   BILITOT 0.60 01/16/2017 0931   GFRNONAA 55 (L) 12/28/2016 1457   GFRNONAA 87 05/07/2016 1113   GFRAA 63 12/28/2016 1457   GFRAA >89 05/07/2016 1113     Medications: I have reviewed the patient's current medications.  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 Negative restaging CT evaluation 10/24/2012  Nodular skin lesions at the right lower leg, status post a shave biopsy 07/15/2013 confirming a malignant B-cell lymphoma, diffuse large cell positive for CD20, BCL 6, BCL 2, and CD10  Staging bone marrow biopsy 07/31/2013, negative for involvement with lymphoma  Staging PET scan 07/30/2013-negative.   Status post palliative radiation right lower leg nodular skin lesions 08/17/2013 through 09/03/2013.New nodular skin lesions at the right lower leg April 2017, status postpsies of lesions at the right lower leg 12/07/2015 confirming large B-cell lymphoma, CD20 positive bio  staging PET scan 12/26/2015-hypermetabolic cutaneous and subcutaneous nodules in the right lower extremity, no other evidence of lymphoma  Cycle 1 bendamustine/Rituxan 01/03/2016  Cycle 2 bendamustine/Rituxan 01/31/2016  Clinical progression with enlargement of multiple right lower leg skin lesions 02/23/2016  Status post radiation right lower leg 03/14/2016 through 03/29/2016  Clinical progression with new and enlarging skin lesions right lower leg 07/06/2016  PET scan 07/12/2016-clear interval increase in size and metabolic activity of a large nodule posterior right calf and a smaller subcutaneous nodule medial right thigh; several more superficial right lower extremity  lesions improved with reduction in size and metabolic activity; single focus of metabolic activity associated with the medial right clavicle with bonychange  Cycle 1 CHOP/Rituxan 07/17/2016  Cycle 2 CHOP/Rituxan 08/07/2016-Neulasta added  Cycle 3 CHOP/Rituxan 08/28/2016 with Neulasta  support  Restaging PET scan 09/14/2016-hypermetabolic subcutaneous nodules previously demonstrated within the right thigh and right calf have nearly resolved. There is some residual activity within the calf which could be postsurgical. Small focus of activity within the left latissimus dorsi muscle within the posterior chest wall without corresponding CT abnormality.  Cycle 4 CHOP/Rituxan 09/18/2016 (Adriamycin deleted, etoposide substituted daily 3)  Cycle 5 CHOP/rituximab with etoposide 10/08/2016  Cycle 6 CHOP/rituximab with etoposide 10/29/2016 2. Port-A-Cath placement 06/01/2011. Port-A-Cath removal 11/04/2012 3. Hypertension. 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 5. Hypercholesterolemia. 6. Osteoarthritis. 7. Malaise-? Secondary to progression of non-Hodgkin's lymphoma. Improved. 8. Staph infection right lower leg February 2015. 9. Right calf lesion with purulent drainage 02/09/2016, culture obtained-group B strep, doxycycline prescribed 10. Port-A-Cath placement 07/13/2016 11. 2-D echo 07/10/2016-estimated ejection fraction range of 55-60% 12. 07/17/2016 hepatitis Bsurface antigen negative, core antibody positive 13. Anemia/thrombocytopenia-persistent following final chemotherapy 10/29/2016, the thrombocytopenia has resolved. 14. Paresthesias, leg weakness, falls-negative brain CT 12/13/2016  Noncontrast brain MRI and MRIs cervical, thoracic, and lumbar spine negative on 12/19/2016  Status post lumbar puncture 12/20/2016; CSF negative for malignant cells 15. Recurrent Urinary tract infections, Escherichia coli, most recent infection 01/10/2017 Disposition:  Kelly Ray has an improved performance status. The confusion and leg weakness have improved. She is scheduled for a neurology follow-up in 2 weeks.  She has persistent anemia following the course of chemotherapy. I suspect this is related to bone marrow toxicity from chemotherapy. We will consider obtaining a bone marrow biopsy if  she requires another transfusion.  She will complete the course of Bactrim for the current urinary tract infection. We will make a urology referral if she has another UTI.  Kelly Ray will return for an office visit 02/01/2017. I encouraged her to try to gain weight prior to the next visit. She will try frequent small meals.  25 minutes were spent with the patient today. The majority of the time was used for counseling and coordination of care.  Betsy Coder, MD  01/16/2017  10:51 AM

## 2017-01-16 NOTE — Patient Instructions (Signed)
Implanted Port Home Guide An implanted port is a type of central line that is placed under the skin. Central lines are used to provide IV access when treatment or nutrition needs to be given through a person's veins. Implanted ports are used for long-term IV access. An implanted port may be placed because:  You need IV medicine that would be irritating to the small veins in your hands or arms.  You need long-term IV medicines, such as antibiotics.  You need IV nutrition for a long period.  You need frequent blood draws for lab tests.  You need dialysis.  Implanted ports are usually placed in the chest area, but they can also be placed in the upper arm, the abdomen, or the leg. An implanted port has two main parts:  Reservoir. The reservoir is round and will appear as a small, raised area under your skin. The reservoir is the part where a needle is inserted to give medicines or draw blood.  Catheter. The catheter is a thin, flexible tube that extends from the reservoir. The catheter is placed into a large vein. Medicine that is inserted into the reservoir goes into the catheter and then into the vein.  How will I care for my incision site? Do not get the incision site wet. Bathe or shower as directed by your health care provider. How is my port accessed? Special steps must be taken to access the port:  Before the port is accessed, a numbing cream can be placed on the skin. This helps numb the skin over the port site.  Your health care provider uses a sterile technique to access the port. ? Your health care provider must put on a mask and sterile gloves. ? The skin over your port is cleaned carefully with an antiseptic and allowed to dry. ? The port is gently pinched between sterile gloves, and a needle is inserted into the port.  Only "non-coring" port needles should be used to access the port. Once the port is accessed, a blood return should be checked. This helps ensure that the port  is in the vein and is not clogged.  If your port needs to remain accessed for a constant infusion, a clear (transparent) bandage will be placed over the needle site. The bandage and needle will need to be changed every week, or as directed by your health care provider.  Keep the bandage covering the needle clean and dry. Do not get it wet. Follow your health care provider's instructions on how to take a shower or bath while the port is accessed.  If your port does not need to stay accessed, no bandage is needed over the port.  What is flushing? Flushing helps keep the port from getting clogged. Follow your health care provider's instructions on how and when to flush the port. Ports are usually flushed with saline solution or a medicine called heparin. The need for flushing will depend on how the port is used.  If the port is used for intermittent medicines or blood draws, the port will need to be flushed: ? After medicines have been given. ? After blood has been drawn. ? As part of routine maintenance.  If a constant infusion is running, the port may not need to be flushed.  How long will my port stay implanted? The port can stay in for as long as your health care provider thinks it is needed. When it is time for the port to come out, surgery will be   done to remove it. The procedure is similar to the one performed when the port was put in. When should I seek immediate medical care? When you have an implanted port, you should seek immediate medical care if:  You notice a bad smell coming from the incision site.  You have swelling, redness, or drainage at the incision site.  You have more swelling or pain at the port site or the surrounding area.  You have a fever that is not controlled with medicine.  This information is not intended to replace advice given to you by your health care provider. Make sure you discuss any questions you have with your health care provider. Document  Released: 08/13/2005 Document Revised: 01/19/2016 Document Reviewed: 04/20/2013 Elsevier Interactive Patient Education  2017 [employer]  

## 2017-01-16 NOTE — Telephone Encounter (Signed)
Gave patient AVS and calender per 5/23 los. Per DR. Sherrill okay to add for 6/8

## 2017-01-17 ENCOUNTER — Telehealth: Payer: Self-pay | Admitting: *Deleted

## 2017-01-17 ENCOUNTER — Telehealth: Payer: Self-pay

## 2017-01-17 MED ORDER — ALPRAZOLAM 0.25 MG PO TABS: 0.2500 mg | ORAL_TABLET | Freq: Every evening | ORAL | 0 refills | Status: DC | PRN

## 2017-01-17 NOTE — Telephone Encounter (Signed)
Call placed back to patient to notify her to complete current course of Bactrim only and that Dr. Benay Spice would like for her to try Xanax for sleep which [facility] [address] [facility] her request.  Patient appreciative of call back.

## 2017-01-17 NOTE — Telephone Encounter (Signed)
Dr Benay Spice told pt he was going to refill her bactrim, his note stated she will complete the current course. Please clarify.   He also said he was going to Rx a low sedative for sleep.

## 2017-01-30 ENCOUNTER — Ambulatory Visit (INDEPENDENT_AMBULATORY_CARE_PROVIDER_SITE_OTHER): Payer: Medicare Other | Admitting: Neurology

## 2017-01-30 ENCOUNTER — Encounter: Payer: Self-pay | Admitting: Neurology

## 2017-01-30 VITALS — BP 110/80 | HR 64 | Ht 63.0 in | Wt 153.0 lb

## 2017-01-30 DIAGNOSIS — G620 Drug-induced polyneuropathy: Secondary | ICD-10-CM

## 2017-01-30 DIAGNOSIS — R269 Unspecified abnormalities of gait and mobility: Secondary | ICD-10-CM | POA: Diagnosis not present

## 2017-01-30 DIAGNOSIS — C8580 Other specified types of non-Hodgkin lymphoma, unspecified site: Secondary | ICD-10-CM | POA: Diagnosis not present

## 2017-01-30 DIAGNOSIS — T451X5A Adverse effect of antineoplastic and immunosuppressive drugs, initial encounter: Secondary | ICD-10-CM

## 2017-01-30 DIAGNOSIS — R9089 Other abnormal findings on diagnostic imaging of central nervous system: Secondary | ICD-10-CM

## 2017-01-30 DIAGNOSIS — R930 Abnormal findings on diagnostic imaging of skull and head, not elsewhere classified: Secondary | ICD-10-CM | POA: Diagnosis not present

## 2017-01-30 DIAGNOSIS — R9082 White matter disease, unspecified: Secondary | ICD-10-CM

## 2017-01-30 NOTE — Progress Notes (Signed)
Subjective:    Patient ID: Kelly Ray is a 76 y.o. female.  HPI     Kelly Age, MD, PhD [facility] [address], [address] [place], [place] [address]  I saw Kelly Ray as a referral from the hospital for gait ataxia and fall. The patient is accompanied by her husband today. 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 She was found to have symptoms of neuropathy which were deemed secondary to chemotherapy. 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 Given patient's history of acute renal failure, contrast is likely contraindicated. Consider correlation with cerebral spinal fluid studies and short-term follow-up.   She had spinal fluid testing on 12/20/2016 which showed normal glucose and protein, cell count was normal, cytology was sent I cannot see the results.   She also had MRI testing of her cervical, thoracic and lumbar spine which I reviewed: IMPRESSION: MRI CERVICAL SPINE: No MR findings of lymphoma, sensitivity decreased by patient motion and lack of contrast.   Degenerative cervical spine resulting in mild canal stenosis C6-7. Multilevel neural foraminal narrowing: Likely severe on the LEFT at C3-4 though limited by motion.   MRI THORACIC SPINE: No MR findings of lymphoma, sensitivity decreased by patient motion and lack of contrast.   Mild old T11 compression fracture. No canal stenosis or neural foraminal narrowing.   MRI LUMBAR SPINE:  No MR findings of lymphoma, sensitivity decreased by lack of contrast.   Mild to moderate old L1 compression fracture. Grade 1 L1-2 and L2-3 retrolisthesis.   Degenerative lumbar spine resulting in moderate canal stenosis L3-4 and L4-5. Multilevel neural foraminal narrowing: Mild to moderate on the RIGHT at L3-4.   She was advised to undergo EMG nerve conduction testing as an outpatient. Clinically, she improved.   She reports doing better. She is just about finished with home health therapy and reports that her therapist has been pleased with her progress. She no longer uses a walker. She uses a cane most of the time, only a walker if she is fatigued. She does have fatigue, usually later in the day. Her lower extremity strength is better. She has not had any recent fall. She's finished with her antibiotic. She finished chemotherapy sometime in March. Her cognitive function has improved quite a bit. She had a recent checkup with her oncologist and a follow-up in a couple of days from now. She is not keen on any major additional testing from our and at this time but would be willing to consider it if she has any new problems. She is willing to proceed with a repeat brain MRI for comparison. I shared some of the brain MRI images on the computer with the patient and her husband today.  Her most recent CMP shows improved kidney function. Nevertheless, her most recent creatinine was 1.2. She has had some problems with her sleep which is also relatively new. She feels that she has a lot on her mind which is understandable.  Her Past Medical History Is Significant For: Past Medical History:  Diagnosis Date  . Anemia    SECONDARY TO  CHEMOTHERAPY  . Arthritis    Osteoarthritis  . B-cell lymphoma ([place]) 05/04/11   Large B-cell lymphoma  . Cancer ([place])    NHL  . Hx of radiation therapy 08/17/13- 09/03/13   right lower extremity- lymphoma  . Hypertension   . S/P knee replacement    Left knee  .  Thyroid disease    Hyperthyroidism    Her Past Surgical History Is Significant For: Past Surgical History:  Procedure Laterality Date  . IR GENERIC HISTORICAL  07/13/2016   IR US GUIDE VASC ACCESS RIGHT 07/13/2016 [facility]INTER[facility]  . IR GENERIC HISTORICAL  07/13/2016   IR FLUORO GUIDE PORT INSERTION RIGHT 07/13/2016 [facility]INTER[facility]  . NECK LESION BIOPSY Right 05/04/11   node biopsies  . TOTAL KNEE ARTHROPLASTY Left    x 2    Her Family History Is Significant For: Family History  Problem Relation Ray of Onset  . Cancer Mother        bladder  . Heart attack Father   . Cancer Brother        kidney    Her Social History Is Significant For: Social History   Social History  . Marital status: Married    Spouse name: N/A  . Number of children: N/A  . Years of education: N/A   Social History Main Topics  . Smoking status: Never Smoker  . Smokeless tobacco: Never Used  . Alcohol use No  . Drug use: No  . Sexual activity: Not Asked   Other Topics Concern  . None   Social History Narrative  . None    Her Allergies Are:  Allergies  Allergen Reactions  . Lisinopril Cough  :   Her Current Medications Are:  Outpatient Encounter Prescriptions as of 01/30/2017  Medication Sig  . ALPRAZolam (XANAX) 0.25 MG tablet Take 1 tablet (0.25 mg total) by mouth at bedtime as needed for anxiety.  Marland Kitchen levothyroxine (SYNTHROID, LEVOTHROID) 112 MCG tablet Take 1 tablet daily  . metoprolol tartrate (LOPRESSOR) 25 MG tablet Take 1 tablet (25 mg total) by mouth 2 (two) times daily. (Patient taking differently: Take 12.5 mg by mouth 2 (two) times daily. )  . pravastatin (PRAVACHOL) 40 MG tablet Take 1 tablet (40 mg total) by mouth at bedtime.  . sulfamethoxazole-trimethoprim (BACTRIM DS,SEPTRA DS) 800-160 MG tablet Take 1 tablet by mouth 2 (two) times daily.   No facility-administered encounter medications on file as of 01/30/2017.   :   Review of Systems:  Out of a complete 14 point review of  systems, all are reviewed and negative with the exception of these symptoms as listed below:  Review of Systems  Neurological:       Pt presents today to discuss a gait problem. Pt was admitted to the hospital 4 months ago for a UTI/confusion and has had trouble walking since then. Gait and confusion are getting better. Pt is having PT through [place] until Friday.    Objective:  Neurologic Exam  Physical Exam Physical Examination:   Vitals:   01/30/17 1035  BP: 110/80  Pulse: 64   General Examination: The patient is a very pleasant 76 y.o. female in no acute distress. She appears mildly deconditioned. She is in good spirits.   HEENT: Normocephalic, atraumatic, pupils are equal, round and reactive to light and accommodation. Extraocular tracking is good without limitation to gaze excursion or nystagmus noted. Normal smooth pursuit is noted. Hearing is grossly intact. Face is symmetric with normal facial animation and  normal facial sensation. Speech is clear with no dysarthria noted. There is no hypophonia. There is no lip, neck/head, jaw or voice tremor. Neck is supple with full range of passive and active motion. There are no carotid bruits on auscultation. Oropharynx exam reveals: mild mouth dryness, adequate dental hygiene. Tongue protrudes centrally and palate elevates symmetrically.   Chest: Clear to auscultation without wheezing, rhonchi or crackles noted.  Heart: S1+S2+0, regular and normal without murmurs, rubs or gallops noted.   Abdomen: Soft, non-tender and non-distended with normal bowel sounds appreciated on auscultation.  Extremities: There is no pitting edema in the distal lower extremities bilaterally. Pedal pulses are intact.  Skin: Warm and dry without trophic changes noted.  Musculoskeletal: exam reveals no obvious joint deformities, tenderness or joint swelling or erythema.   Neurologically:  Mental status: The patient is awake, alert and oriented in all 4  spheres. Her immediate and remote memory, attention, language skills and fund of knowledge are appropriate. There is no evidence of aphasia, agnosia, apraxia or anomia. Speech is clear with normal prosody and enunciation. Thought process is linear. Mood is normal and affect is normal.  Cranial nerves II - XII are as described above under HEENT exam. In addition: shoulder shrug is normal with equal shoulder height noted. Motor exam: Normal bulk, strength and tone is noted in the upper extremities, she has mild hip flexor weakness in both lower extremities, and the 4+ out of 5 range, otherwise good strength, she has no tremor or rebound, Romberg is not tested for safety. Reflexes are 1-2+ in the upper extremities, trace in both knees and absent in the ankles, toes are downgoing. Fine motor skills are normal for Ray in the upper extremities, mildly impaired in the lower extremities. Sensory exam is slightly difficult to assess because it appears to show some diminished sensation for pinprick and vibration in the feet, she seems to downplay this discrepancy but reports no significant changes in pinprick and vibration sense upon retesting. She stands with mild difficulty and pushes himself up, she stands slightly wide-based but is able to stand narrow based. She walks with a single-point cane without limping. Tandem walk is not tested for safety.  Assessment and Plan:  Assessment and Plan:  In summary, Kelly Ray is a very pleasant 76 y.o.-year old female  with a complex medical history of diffuse B-cell lymphoma, status post chemotherapy, who was recently hospitalized from 12/19/2016 through 12/23/2016 for fall and weakness, failure to thrive, and urinary tract infection. She was found to have symptoms of neuropathyAs well as extensive white matter changes on a noncontrast brain MRI, deemed likely secondary to chemotherapy which she had finished just recently. She has clinically improved. She feels that she  has made quite a bit of progress and her husband is in agreement. Her strength is fairly good throughout but she seems to be deconditioned. She has mild hip flexor weakness and diminished reflexes in her lower extremities, evidence of mild neuropathy. She declines EMG and nerve conduction testing at this time. She is advised to continue with her exercise and her nutrition regimen. She has a follow-up pending with her oncologist soon. I would like to proceed with a brain MRI without contrast for comparison. She is agreeable to this. We will call her with her test results. I suggested a 3 to four-month checkup to make sure her clinical progress continues in the right direction. If needed, we will order an EMG and nerve conduction test and she is  strongly advised to call our office with any interim questions or concerns or problems she may have. I answered all their questions today and the patient and her husband were in agreement.  Kelly Age, MD, PhD

## 2017-01-30 NOTE — Patient Instructions (Addendum)
I am glad to hear you are doing better. Your brain MRI in April had shown some changes which were deemed secondary to chemotherapy for your lymphoma.  I would like to suggest repeating your brain MRI for comparison, and we will call you with the test results. We will have to schedule you for this on a separate date. This test requires authorization from your insurance, and we will take care of the insurance process.  If your brain MRI looks improved, we can see you back in [place] in about 3-4 months for a recheck with one of our Nurse practitioners.   We may consider EMG and nerve conduction velocity test, which is an electrical nerve and muscle test, which may call schedule in the future, but will hold off for now, as you are doing better.

## 2017-02-01 ENCOUNTER — Other Ambulatory Visit (HOSPITAL_BASED_OUTPATIENT_CLINIC_OR_DEPARTMENT_OTHER): Payer: Medicare Other

## 2017-02-01 ENCOUNTER — Other Ambulatory Visit: Payer: Self-pay

## 2017-02-01 ENCOUNTER — Ambulatory Visit (HOSPITAL_BASED_OUTPATIENT_CLINIC_OR_DEPARTMENT_OTHER): Payer: Medicare Other | Admitting: Oncology

## 2017-02-01 VITALS — BP 108/64 | HR 101 | Temp 98.2°F | Resp 18 | Ht 63.0 in | Wt 151.9 lb

## 2017-02-01 DIAGNOSIS — R296 Repeated falls: Secondary | ICD-10-CM

## 2017-02-01 DIAGNOSIS — C8335 Diffuse large B-cell lymphoma, lymph nodes of inguinal region and lower limb: Secondary | ICD-10-CM | POA: Diagnosis not present

## 2017-02-01 DIAGNOSIS — Z87440 Personal history of urinary (tract) infections: Secondary | ICD-10-CM

## 2017-02-01 DIAGNOSIS — E039 Hypothyroidism, unspecified: Secondary | ICD-10-CM

## 2017-02-01 DIAGNOSIS — E785 Hyperlipidemia, unspecified: Secondary | ICD-10-CM | POA: Diagnosis not present

## 2017-02-01 DIAGNOSIS — M1990 Unspecified osteoarthritis, unspecified site: Secondary | ICD-10-CM

## 2017-02-01 DIAGNOSIS — M6281 Muscle weakness (generalized): Secondary | ICD-10-CM | POA: Diagnosis not present

## 2017-02-01 DIAGNOSIS — C8330 Diffuse large B-cell lymphoma, unspecified site: Secondary | ICD-10-CM

## 2017-02-01 DIAGNOSIS — I10 Essential (primary) hypertension: Secondary | ICD-10-CM | POA: Diagnosis not present

## 2017-02-01 LAB — CBC WITH DIFFERENTIAL/PLATELET
BASO%: 0.2 % (ref 0.0–2.0)
Basophils Absolute: 0 10*3/uL (ref 0.0–0.1)
EOS%: 0.3 % (ref 0.0–7.0)
Eosinophils Absolute: 0 10*3/uL (ref 0.0–0.5)
HEMATOCRIT: 32.1 % — AB (ref 34.8–46.6)
HGB: 10.4 g/dL — ABNORMAL LOW (ref 11.6–15.9)
LYMPH#: 0.8 10*3/uL — AB (ref 0.9–3.3)
LYMPH%: 13.6 % — ABNORMAL LOW (ref 14.0–49.7)
MCH: 28.4 pg (ref 25.1–34.0)
MCHC: 32.4 g/dL (ref 31.5–36.0)
MCV: 87.7 fL (ref 79.5–101.0)
MONO#: 1 10*3/uL — AB (ref 0.1–0.9)
MONO%: 16.3 % — ABNORMAL HIGH (ref 0.0–14.0)
NEUT%: 69.6 % (ref 38.4–76.8)
NEUTROS ABS: 4.1 10*3/uL (ref 1.5–6.5)
PLATELETS: 144 10*3/uL — AB (ref 145–400)
RBC: 3.66 10*6/uL — ABNORMAL LOW (ref 3.70–5.45)
RDW: 18 % — AB (ref 11.2–14.5)
WBC: 6 10*3/uL (ref 3.9–10.3)

## 2017-02-01 LAB — COMPREHENSIVE METABOLIC PANEL
ALBUMIN: 3 g/dL — AB (ref 3.5–5.0)
ALK PHOS: 124 U/L (ref 40–150)
ALT: 13 U/L (ref 0–55)
ANION GAP: 12 meq/L — AB (ref 3–11)
AST: 16 U/L (ref 5–34)
BILIRUBIN TOTAL: 0.94 mg/dL (ref 0.20–1.20)
BUN: 32.5 mg/dL — ABNORMAL HIGH (ref 7.0–26.0)
CALCIUM: 10 mg/dL (ref 8.4–10.4)
CO2: 23 mEq/L (ref 22–29)
Chloride: 100 mEq/L (ref 98–109)
Creatinine: 1.9 mg/dL — ABNORMAL HIGH (ref 0.6–1.1)
EGFR: 25 mL/min/{1.73_m2} — AB (ref >90)
Glucose: 118 mg/dl (ref 70–140)
Potassium: 4 mEq/L (ref 3.5–5.1)
Sodium: 135 mEq/L — ABNORMAL LOW (ref 136–145)
Total Protein: 6.4 g/dL (ref 6.4–8.3)

## 2017-02-01 LAB — FERRITIN: Ferritin: 2501 ng/ml — ABNORMAL HIGH (ref 9–269)

## 2017-02-01 LAB — LACTATE DEHYDROGENASE: LDH: 205 U/L (ref 125–245)

## 2017-02-01 LAB — TECHNOLOGIST REVIEW

## 2017-02-01 NOTE — Progress Notes (Signed)
[facility] OFFICE PROGRESS NOTE   Diagnosis: Non-Hodgkin's lymphoma  INTERVAL HISTORY:   Kelly Ray returns as scheduled. She has an improved appetite. She continues to have leg weakness and reports several falls over the past few weeks. No fever or night sweats. No new skin lesions.   Objective:  Vital signs in last 24 hours:  Blood pressure 108/64, pulse (!) 101, temperature 98.2 F (36.8 C), temperature source Oral, resp. rate 18, height '5\' 3"'$  (1.6 m), weight 151 lb 14.4 oz (68.9 kg), SpO2 100 %.    HEENT: No thrush Resp: Lungs with scattered inspiratory rhonchi, no respiratory distress Cardio: Irregular GI: No hepatosplenomegaly Vascular: No leg edema Neuro: Alert, oriented to place and situation. Not oriented to year. The motor exam appears intact in the upper and lower extremities. She is able to ambulate.  Skin: Hyperpigmented scars at previous sites of lymphoma over the legs. No nodular lesions.   Portacath/PICC-without erythema  Lab Results:  Lab Results  Component Value Date   WBC 6.0 02/01/2017   HGB 10.4 (L) 02/01/2017   HCT 32.1 (L) 02/01/2017   MCV 87.7 02/01/2017   PLT 144 (L) 02/01/2017   NEUTROABS 4.1 02/01/2017    CMP     Component Value Date/Time   NA 135 (L) 02/01/2017 1021   K 4.0 02/01/2017 1021   CL 99 12/28/2016 1457   CL 103 10/24/2012 0859   CO2 23 02/01/2017 1021   GLUCOSE 118 02/01/2017 1021   GLUCOSE 97 10/24/2012 0859   BUN 32.5 (H) 02/01/2017 1021   CREATININE 1.9 (H) 02/01/2017 1021   CALCIUM 10.0 02/01/2017 1021   PROT 6.4 02/01/2017 1021   ALBUMIN 3.0 (L) 02/01/2017 1021   AST 16 02/01/2017 1021   ALT 13 02/01/2017 1021   ALKPHOS 124 02/01/2017 1021   BILITOT 0.94 02/01/2017 1021   GFRNONAA 55 (L) 12/28/2016 1457   GFRNONAA 87 05/07/2016 1113   GFRAA 63 12/28/2016 1457   GFRAA >89 05/07/2016 1113   EKG: Sinus rhythm with premature atrial complexes  Medications: I have reviewed the patient's current  medications.  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 Negative restaging CT evaluation 10/24/2012  Nodular skin lesions at the right lower leg, status post a shave biopsy 07/15/2013 confirming a malignant B-cell lymphoma, diffuse large cell positive for CD20, BCL 6, BCL 2, and CD10  Staging bone marrow biopsy 07/31/2013, negative for involvement with lymphoma  Staging PET scan 07/30/2013-negative.   Status post palliative radiation right lower leg nodular skin lesions 08/17/2013 through 09/03/2013.New nodular skin lesions at the right lower leg April 2017, status postpsies of lesions at the right lower leg 12/07/2015 confirming large B-cell lymphoma, CD20 positive bio  staging PET scan 12/26/2015-hypermetabolic cutaneous and subcutaneous nodules in the right lower extremity, no other evidence of lymphoma  Cycle 1 bendamustine/Rituxan 01/03/2016  Cycle 2 bendamustine/Rituxan 01/31/2016  Clinical progression with enlargement of multiple right lower leg skin lesions 02/23/2016  Status post radiation right lower leg 03/14/2016 through 03/29/2016  Clinical progression with new and enlarging skin lesions right lower leg 07/06/2016  PET scan 07/12/2016-clear interval increase in size and metabolic activity of a large nodule posterior right calf and a smaller subcutaneous nodule medial right thigh; several more superficial right lower extremity lesions improved with reduction in size and metabolic activity; single focus of metabolic activity associated with the medial right clavicle with bonychange  Cycle 1  CHOP/Rituxan 07/17/2016  Cycle 2 CHOP/Rituxan 08/07/2016-Neulasta added  Cycle 3 CHOP/Rituxan 08/28/2016 with Neulasta support  Restaging PET scan 09/14/2016-hypermetabolic subcutaneous nodules previously demonstrated within the right thigh and right calf have nearly resolved.  There is some residual activity within the calf which could be postsurgical. Small focus of activity within the left latissimus dorsi muscle within the posterior chest wall without corresponding CT abnormality.  Cycle 4 CHOP/Rituxan 09/18/2016 (Adriamycin deleted, etoposide substituted daily 3)  Cycle 5 CHOP/rituximab with etoposide 10/08/2016  Cycle 6 CHOP/rituximab with etoposide 10/29/2016 2. Port-A-Cath placement 06/01/2011. Port-A-Cath removal 11/04/2012 3. Hypertension. 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 5. Hypercholesterolemia. 6. Osteoarthritis. 7. Malaise-? Secondary to progression of non-Hodgkin's lymphoma. Improved. 8. Staph infection right lower leg February 2015. 9. Right calf lesion with purulent drainage 02/09/2016, culture obtained-group B strep, doxycycline prescribed 10. Port-A-Cath placement 07/13/2016 11. 2-D echo 07/10/2016-estimated ejection fraction range of 55-60% 12. 07/17/2016 hepatitis Bsurface antigen negative, core antibody positive 13. Anemia/thrombocytopenia-persistent following final chemotherapy 10/29/2016, the thrombocytopenia has resolved, anemia has improved. 14. Paresthesias, leg weakness, falls-negative brain CT 12/13/2016  Noncontrast brain MRI and MRIs cervical, thoracic, and lumbar spine negative on 12/19/2016  Status post lumbar puncture 12/20/2016; CSF negative for malignant cells 15. Recurrent Urinary tract infections, Escherichia coli, most recent infection 01/10/2017    Disposition: Her overall performance status has improved. She continues to have leg weakness and falls. This is likely secondary to deconditioning. No clinical evidence for progression of the lymphoma.  We will make a referral to the neuro physical therapy service.  Kelly Ray will return for an office and lab visit in 3 weeks.  25 minutes were spent with the patient today. The majority of the time was used for counseling and coordination of care. Donneta Romberg,  MD  02/01/2017  11:12 AM

## 2017-02-04 ENCOUNTER — Other Ambulatory Visit: Payer: Self-pay | Admitting: *Deleted

## 2017-02-04 ENCOUNTER — Telehealth: Payer: Self-pay | Admitting: *Deleted

## 2017-02-04 DIAGNOSIS — C8335 Diffuse large B-cell lymphoma, lymph nodes of inguinal region and lower limb: Secondary | ICD-10-CM

## 2017-02-04 NOTE — Telephone Encounter (Signed)
Message from pt asking if referral has been sent for neuro PT.  Returned call, informed her that referral was sent today. Informed her the office will call with appt.

## 2017-02-06 ENCOUNTER — Telehealth: Payer: Self-pay | Admitting: Oncology

## 2017-02-06 ENCOUNTER — Telehealth: Payer: Self-pay | Admitting: Neurology

## 2017-02-06 NOTE — Telephone Encounter (Signed)
Patient called office in reference to still having neurological problems.  Patient did not give details but would like a returned call from Dr. Rexene Alberts.  Please call

## 2017-02-06 NOTE — Telephone Encounter (Signed)
sw pt to confirm 6/29 appt per 6/8 LOS

## 2017-02-07 NOTE — Telephone Encounter (Signed)
Pt returned RN's call  Msg relayed

## 2017-02-07 NOTE — Telephone Encounter (Signed)
Returned pt's call but no answer. Left VM mssg reminding pt that she does have MRI scheduled next week (02/12/17) and that we would contact her when results are available. She may call back if she has any additional questions/concerns before then.

## 2017-02-12 ENCOUNTER — Ambulatory Visit
Admission: RE | Admit: 2017-02-12 | Discharge: 2017-02-12 | Disposition: A | Discharge status: Home / Self Care | Payer: Medicare Other | Source: Ambulatory Visit | Attending: Neurology | Admitting: Neurology

## 2017-02-12 DIAGNOSIS — R269 Unspecified abnormalities of gait and mobility: Secondary | ICD-10-CM | POA: Diagnosis not present

## 2017-02-12 DIAGNOSIS — R9089 Other abnormal findings on diagnostic imaging of central nervous system: Secondary | ICD-10-CM

## 2017-02-12 DIAGNOSIS — C8580 Other specified types of non-Hodgkin lymphoma, unspecified site: Secondary | ICD-10-CM | POA: Diagnosis not present

## 2017-02-12 NOTE — Progress Notes (Signed)
Please call patient regarding her recent repeat brain MRI. She had a brain MRI without contrast on 02/12/2017 which showed similar findings of white matter changes that were seen before on her MRI from 12/19/2016. On the positive side, there may be slight improvement in the changes in the brain which is reassuring and speaks against anything other than chemotherapy related changes like we discussed. At this juncture, we can monitor her clinically. She may benefit from repeat brain MRI a little further down the Road for comparison. As discussed during her clinic visit on 01/30/2017, she can keep her follow-up appointment with one of our nurse practitioners in about 3-4 months. This should have been scheduled.  Star Age, MD, PhD [facility])

## 2017-02-13 ENCOUNTER — Ambulatory Visit (HOSPITAL_BASED_OUTPATIENT_CLINIC_OR_DEPARTMENT_OTHER): Payer: Medicare Other

## 2017-02-13 ENCOUNTER — Telehealth: Payer: Self-pay | Admitting: *Deleted

## 2017-02-13 ENCOUNTER — Other Ambulatory Visit: Payer: Self-pay | Admitting: *Deleted

## 2017-02-13 DIAGNOSIS — C8335 Diffuse large B-cell lymphoma, lymph nodes of inguinal region and lower limb: Secondary | ICD-10-CM | POA: Diagnosis not present

## 2017-02-13 DIAGNOSIS — C8330 Diffuse large B-cell lymphoma, unspecified site: Secondary | ICD-10-CM

## 2017-02-13 LAB — URINALYSIS, MICROSCOPIC - CHCC
BILIRUBIN (URINE): NEGATIVE
Glucose: NEGATIVE mg/dL
Ketones: NEGATIVE mg/dL
NITRITE: POSITIVE
Protein: 100 mg/dL
Specific Gravity, Urine: 1.01 (ref 1.003–1.035)
Urobilinogen, UR: 0.2 mg/dL (ref 0.2–1)
pH: 6 (ref 4.6–8.0)

## 2017-02-13 MED ORDER — SULFAMETHOXAZOLE-TRIMETHOPRIM 800-160 MG PO TABS: ORAL_TABLET | ORAL | 0 refills | Status: DC

## 2017-02-13 NOTE — Telephone Encounter (Signed)
Patient here for u/a and awaiting results in lobby.  Patient notified that she does have a UTI and that antibiotic will be called in for her per order of Dr. Benay Spice.

## 2017-02-13 NOTE — Telephone Encounter (Signed)
Attempted to reach patient on home phone, no answer, no voice mail.

## 2017-02-13 NOTE — Telephone Encounter (Signed)
Message received from requesting a call back regarding her urinary tract.  Call placed back to patient and pt states that her urine is cloudy with an odor.  Pt instructed to come to [facility] for a u/a.  Pt states that she can be here by noon today.  Message sent to scheduling.

## 2017-02-14 ENCOUNTER — Telehealth: Payer: Self-pay | Admitting: Neurology

## 2017-02-14 NOTE — Telephone Encounter (Signed)
Pt's husband said she is experiencing anxiety attacks that started about 5 weeks after being in the hospital for UTI (he said she does not remember being in the hospital). He said speech is slurred, extremely nervous/scared, she has to have him with her/she withdraws, these last about 15 or 20 min. He said it is caused when she get flustered, frustrated, she had one today. He said this was not discussed at the [place] 01/30/17. He is wanting to know if there is medication that can help. Please call

## 2017-02-14 NOTE — Telephone Encounter (Signed)
-----   Message from Star Age, MD sent at 02/12/2017  6:27 PM EDT ----- Please call patient regarding her recent repeat brain MRI. She had a brain MRI without contrast on 02/12/2017 which showed similar findings of white matter changes that were seen before on her MRI from 12/19/2016. On the positive side, there may be slight improvement in the changes in the brain which is reassuring and speaks against anything other than chemotherapy related changes like we discussed. At this juncture, we can monitor her clinically. She may benefit from repeat brain MRI a little further down the Road for comparison. As discussed during her clinic visit on 01/30/2017, she can keep her follow-up appointment with one of our nurse practitioners in about 3-4 months. This should have been scheduled.  Star Age, MD, PhD [facility])

## 2017-02-14 NOTE — Telephone Encounter (Signed)
I had an extended conversation with this Kelly Ray. She reports having frequent anxiety attacks and feels nervous. The spells last about 30 minutes and is helped when her husband brings her a cool drink and cold compress. I encouraged Kelly Ray to speak with Dr. Nolon Rod, PCP regarding management of her anxiety and Kelly Ray may benefit from a psychiatry referral. I repeatedly explained to Kelly Ray that Dr. Rexene Alberts will not be able to prescribe her anything for her anxiety. Kelly Ray says that she will call Dr. Nolon Rod office to discuss.  I advised Kelly Ray that her MRI showed similar finds of white matter changes that were seen in her MRI from April. However, there might be slight improvement in the changes in her brain which speaks against anything other than chemotherapy related changes. I advised her that a repeat MRI further down the road may be pertinent I encouraged her to keep her follow up appt with Hoyle Sauer, NP in October. Kelly Ray verbalized understanding of results. Kelly Ray had no questions at this time but was encouraged to call back if questions arise.  Of note, Kelly Ray's husband wanted to speak with me regarding her anxiety while I was on the phone, and I advised him that he is not listed on Kelly Ray's DPR, therefore, I cannot legally speak with him. A new DPR would need to be filled out and signed by Kelly Ray granting [place] permission to speak with him regarding the Kelly Ray. Kelly Ray and her husband report that they will come by and sign a new DPR. I gave clinic hours.

## 2017-02-15 LAB — URINE CULTURE

## 2017-02-18 ENCOUNTER — Telehealth: Payer: Self-pay

## 2017-02-18 DIAGNOSIS — N390 Urinary tract infection, site not specified: Secondary | ICD-10-CM

## 2017-02-18 DIAGNOSIS — R350 Frequency of micturition: Secondary | ICD-10-CM

## 2017-02-18 NOTE — Telephone Encounter (Addendum)
Discussed pt's call with Dr. Benay Spice: DC Bactrim. Come in for repeat UA/ culture then start macrobid BID. Per MD, if n/v persists, will need to be seen sooner. Spoke with pt's husband. Informed him of plan. He requested early AM appointment for UA. He reports he was able to get her to eat a peanut butter sandwich this afternoon.  Message to schedulers for lab appt.

## 2017-02-18 NOTE — Telephone Encounter (Signed)
Pt called stating she cannot keep anything down. She is on bactrim for UTI and she wants to have an extension of the bactrim. They have one for tonight and tomorrow. Her urine is still a little cloudy. She is still going frequently, every 1/2 hour.   She has been vomiting 3 times in 4 days. Her appetite is terrible since about the UTI started. They have pepto-bismol, using ice and gingerale.     Yesterday, cereal, 1/2 can soup and 2 small pieces of steak. Today an oatmeal cookie, and chicken soup

## 2017-02-18 NOTE — Telephone Encounter (Signed)
Upon further review it was noted pt's creatinine clearance is less than 30. Per Dr. Benay Spice: Will wait on culture to decide whether to treat.

## 2017-02-19 ENCOUNTER — Ambulatory Visit (HOSPITAL_BASED_OUTPATIENT_CLINIC_OR_DEPARTMENT_OTHER): Payer: Medicare Other

## 2017-02-19 ENCOUNTER — Telehealth: Payer: Self-pay | Admitting: *Deleted

## 2017-02-19 ENCOUNTER — Inpatient Hospital Stay (HOSPITAL_COMMUNITY)
Admission: EM | Admit: 2017-02-19 | Discharge: 2017-02-21 | DRG: 682 | Disposition: A | Discharge status: Home Health | Payer: Medicare Other | Attending: Family Medicine | Admitting: Family Medicine

## 2017-02-19 ENCOUNTER — Encounter (HOSPITAL_COMMUNITY): Payer: Self-pay | Admitting: Vascular Surgery

## 2017-02-19 DIAGNOSIS — E861 Hypovolemia: Secondary | ICD-10-CM | POA: Diagnosis present

## 2017-02-19 DIAGNOSIS — E871 Hypo-osmolality and hyponatremia: Secondary | ICD-10-CM | POA: Diagnosis present

## 2017-02-19 DIAGNOSIS — F410 Panic disorder [episodic paroxysmal anxiety] without agoraphobia: Secondary | ICD-10-CM | POA: Diagnosis present

## 2017-02-19 DIAGNOSIS — G620 Drug-induced polyneuropathy: Secondary | ICD-10-CM | POA: Diagnosis present

## 2017-02-19 DIAGNOSIS — R531 Weakness: Secondary | ICD-10-CM | POA: Diagnosis not present

## 2017-02-19 DIAGNOSIS — Z1612 Extended spectrum beta lactamase (ESBL) resistance: Secondary | ICD-10-CM | POA: Diagnosis present

## 2017-02-19 DIAGNOSIS — L8990 Pressure ulcer of unspecified site, unspecified stage: Secondary | ICD-10-CM | POA: Diagnosis present

## 2017-02-19 DIAGNOSIS — Z66 Do not resuscitate: Secondary | ICD-10-CM | POA: Diagnosis present

## 2017-02-19 DIAGNOSIS — E7800 Pure hypercholesterolemia, unspecified: Secondary | ICD-10-CM | POA: Diagnosis present

## 2017-02-19 DIAGNOSIS — E875 Hyperkalemia: Secondary | ICD-10-CM

## 2017-02-19 DIAGNOSIS — I10 Essential (primary) hypertension: Secondary | ICD-10-CM

## 2017-02-19 DIAGNOSIS — Z96652 Presence of left artificial knee joint: Secondary | ICD-10-CM | POA: Diagnosis present

## 2017-02-19 DIAGNOSIS — E039 Hypothyroidism, unspecified: Secondary | ICD-10-CM | POA: Diagnosis present

## 2017-02-19 DIAGNOSIS — C8330 Diffuse large B-cell lymphoma, unspecified site: Secondary | ICD-10-CM | POA: Diagnosis present

## 2017-02-19 DIAGNOSIS — R350 Frequency of micturition: Secondary | ICD-10-CM

## 2017-02-19 DIAGNOSIS — E46 Unspecified protein-calorie malnutrition: Secondary | ICD-10-CM | POA: Diagnosis present

## 2017-02-19 DIAGNOSIS — N390 Urinary tract infection, site not specified: Secondary | ICD-10-CM | POA: Diagnosis present

## 2017-02-19 DIAGNOSIS — C8335 Diffuse large B-cell lymphoma, lymph nodes of inguinal region and lower limb: Secondary | ICD-10-CM | POA: Diagnosis not present

## 2017-02-19 DIAGNOSIS — E872 Acidosis: Secondary | ICD-10-CM | POA: Diagnosis present

## 2017-02-19 DIAGNOSIS — A419 Sepsis, unspecified organism: Secondary | ICD-10-CM | POA: Insufficient documentation

## 2017-02-19 DIAGNOSIS — N170 Acute kidney failure with tubular necrosis: Principal | ICD-10-CM | POA: Diagnosis present

## 2017-02-19 DIAGNOSIS — Z79899 Other long term (current) drug therapy: Secondary | ICD-10-CM | POA: Diagnosis not present

## 2017-02-19 DIAGNOSIS — E860 Dehydration: Secondary | ICD-10-CM

## 2017-02-19 DIAGNOSIS — Z1624 Resistance to multiple antibiotics: Secondary | ICD-10-CM | POA: Diagnosis present

## 2017-02-19 DIAGNOSIS — D6481 Anemia due to antineoplastic chemotherapy: Secondary | ICD-10-CM | POA: Diagnosis present

## 2017-02-19 DIAGNOSIS — T451X5A Adverse effect of antineoplastic and immunosuppressive drugs, initial encounter: Secondary | ICD-10-CM | POA: Diagnosis present

## 2017-02-19 DIAGNOSIS — B9620 Unspecified Escherichia coli [E. coli] as the cause of diseases classified elsewhere: Secondary | ICD-10-CM | POA: Diagnosis present

## 2017-02-19 DIAGNOSIS — G4700 Insomnia, unspecified: Secondary | ICD-10-CM | POA: Diagnosis present

## 2017-02-19 DIAGNOSIS — Z6826 Body mass index (BMI) 26.0-26.9, adult: Secondary | ICD-10-CM

## 2017-02-19 DIAGNOSIS — L89012 Pressure ulcer of right elbow, stage 2: Secondary | ICD-10-CM | POA: Diagnosis not present

## 2017-02-19 DIAGNOSIS — N179 Acute kidney failure, unspecified: Secondary | ICD-10-CM

## 2017-02-19 DIAGNOSIS — N19 Unspecified kidney failure: Secondary | ICD-10-CM

## 2017-02-19 DIAGNOSIS — C8510 Unspecified B-cell lymphoma, unspecified site: Secondary | ICD-10-CM | POA: Diagnosis present

## 2017-02-19 DIAGNOSIS — Z923 Personal history of irradiation: Secondary | ICD-10-CM

## 2017-02-19 DIAGNOSIS — D638 Anemia in other chronic diseases classified elsewhere: Secondary | ICD-10-CM | POA: Diagnosis present

## 2017-02-19 DIAGNOSIS — E43 Unspecified severe protein-calorie malnutrition: Secondary | ICD-10-CM | POA: Insufficient documentation

## 2017-02-19 DIAGNOSIS — B9629 Other Escherichia coli [E. coli] as the cause of diseases classified elsewhere: Secondary | ICD-10-CM | POA: Diagnosis present

## 2017-02-19 DIAGNOSIS — D649 Anemia, unspecified: Secondary | ICD-10-CM | POA: Diagnosis not present

## 2017-02-19 LAB — COMPREHENSIVE METABOLIC PANEL
ALT: 33 U/L (ref 14–54)
ALT: 28 U/L (ref 14–54)
AST: 30 U/L (ref 15–41)
AST: 27 U/L (ref 15–41)
Albumin: 3.1 g/dL — ABNORMAL LOW (ref 3.5–5.0)
Albumin: 2.2 g/dL — ABNORMAL LOW (ref 3.5–5.0)
Alkaline Phosphatase: 143 U/L — ABNORMAL HIGH (ref 38–126)
Alkaline Phosphatase: 107 U/L (ref 38–126)
Anion gap: 10 (ref 5–15)
Anion gap: 10 (ref 5–15)
BUN: 76 mg/dL — ABNORMAL HIGH (ref 6–20)
BUN: 65 mg/dL — ABNORMAL HIGH (ref 6–20)
CALCIUM: 8.1 mg/dL — AB (ref 8.9–10.3)
CHLORIDE: 107 mmol/L (ref 101–111)
CO2: 18 mmol/L — ABNORMAL LOW (ref 22–32)
CO2: 16 mmol/L — ABNORMAL LOW (ref 22–32)
CREATININE: 3.7 mg/dL — AB (ref 0.44–1.00)
Calcium: 9.5 mg/dL (ref 8.9–10.3)
Chloride: 101 mmol/L (ref 101–111)
Creatinine, Ser: 4.31 mg/dL — ABNORMAL HIGH (ref 0.44–1.00)
GFR calc Af Amer: 11 mL/min — ABNORMAL LOW (ref >60)
GFR calc non Af Amer: 9 mL/min — ABNORMAL LOW (ref >60)
GFR, EST AFRICAN AMERICAN: 13 mL/min — AB (ref >60)
GFR, EST NON AFRICAN AMERICAN: 11 mL/min — AB (ref >60)
Glucose, Bld: 103 mg/dL — ABNORMAL HIGH (ref 65–99)
Glucose, Bld: 145 mg/dL — ABNORMAL HIGH (ref 65–99)
Potassium: 7.4 mmol/L (ref 3.5–5.1)
Potassium: 5 mmol/L (ref 3.5–5.1)
Sodium: 129 mmol/L — ABNORMAL LOW (ref 135–145)
Sodium: 133 mmol/L — ABNORMAL LOW (ref 135–145)
Total Bilirubin: 0.6 mg/dL (ref 0.3–1.2)
Total Bilirubin: 0.7 mg/dL (ref 0.3–1.2)
Total Protein: 6.6 g/dL (ref 6.5–8.1)
Total Protein: 4.6 g/dL — ABNORMAL LOW (ref 6.5–8.1)

## 2017-02-19 LAB — URINALYSIS, ROUTINE W REFLEX MICROSCOPIC
Bilirubin Urine: NEGATIVE
Glucose, UA: 50 mg/dL — AB
Ketones, ur: NEGATIVE mg/dL
Nitrite: NEGATIVE
Protein, ur: 100 mg/dL — AB
Specific Gravity, Urine: 1.01 (ref 1.005–1.030)
pH: 6 (ref 5.0–8.0)

## 2017-02-19 LAB — BASIC METABOLIC PANEL
ANION GAP: 11 meq/L (ref 3–11)
BUN: 74.7 mg/dL — AB (ref 7.0–26.0)
CALCIUM: 9.9 mg/dL (ref 8.4–10.4)
CO2: 18 mEq/L — ABNORMAL LOW (ref 22–29)
Chloride: 101 mEq/L (ref 98–109)
Creatinine: 4 mg/dL (ref 0.6–1.1)
EGFR: 10 mL/min/{1.73_m2} — AB (ref >90)
GLUCOSE: 96 mg/dL (ref 70–140)
Sodium: 130 mEq/L — ABNORMAL LOW (ref 136–145)

## 2017-02-19 LAB — LIPASE, BLOOD: Lipase: 92 U/L — ABNORMAL HIGH (ref 11–51)

## 2017-02-19 LAB — CBC WITH DIFFERENTIAL/PLATELET
BASO%: 0.2 % (ref 0.0–2.0)
BASOS ABS: 0 10*3/uL (ref 0.0–0.1)
EOS%: 0.5 % (ref 0.0–7.0)
Eosinophils Absolute: 0.1 10*3/uL (ref 0.0–0.5)
HCT: 31.5 % — ABNORMAL LOW (ref 34.8–46.6)
HGB: 10.1 g/dL — ABNORMAL LOW (ref 11.6–15.9)
LYMPH#: 2.3 10*3/uL (ref 0.9–3.3)
LYMPH%: 20.7 % (ref 14.0–49.7)
MCH: 28.1 pg (ref 25.1–34.0)
MCHC: 32.1 g/dL (ref 31.5–36.0)
MCV: 87.7 fL (ref 79.5–101.0)
MONO#: 1.1 10*3/uL — ABNORMAL HIGH (ref 0.1–0.9)
MONO%: 10.3 % (ref 0.0–14.0)
NEUT#: 7.5 10*3/uL — ABNORMAL HIGH (ref 1.5–6.5)
NEUT%: 68.3 % (ref 38.4–76.8)
Platelets: 305 10*3/uL (ref 145–400)
RBC: 3.59 10*6/uL — AB (ref 3.70–5.45)
RDW: 18.2 % — ABNORMAL HIGH (ref 11.2–14.5)
WBC: 10.9 10*3/uL — ABNORMAL HIGH (ref 3.9–10.3)

## 2017-02-19 LAB — URINALYSIS, MICROSCOPIC - CHCC
Bilirubin (Urine): NEGATIVE
GLUCOSE UR CHCC: NEGATIVE mg/dL
Ketones: NEGATIVE mg/dL
NITRITE: NEGATIVE
PROTEIN: 100 mg/dL
SPECIFIC GRAVITY, URINE: 1.01 (ref 1.003–1.035)
UROBILINOGEN UR: 0.2 mg/dL (ref 0.2–1)
pH: 6 (ref 4.6–8.0)

## 2017-02-19 LAB — CBC
HCT: 32.2 % — ABNORMAL LOW (ref 36.0–46.0)
Hemoglobin: 10.3 g/dL — ABNORMAL LOW (ref 12.0–15.0)
MCH: 27.8 pg (ref 26.0–34.0)
MCHC: 32 g/dL (ref 30.0–36.0)
MCV: 87 fL (ref 78.0–100.0)
Platelets: 311 10*3/uL (ref 150–400)
RBC: 3.7 MIL/uL — ABNORMAL LOW (ref 3.87–5.11)
RDW: 18 % — ABNORMAL HIGH (ref 11.5–15.5)
WBC: 11.2 10*3/uL — ABNORMAL HIGH (ref 4.0–10.5)

## 2017-02-19 LAB — TECHNOLOGIST REVIEW

## 2017-02-19 LAB — I-STAT CG4 LACTIC ACID, ED: Lactic Acid, Venous: 1.73 mmol/L (ref 0.5–1.9)

## 2017-02-19 LAB — I-STAT TROPONIN, ED: Troponin i, poc: 0.06 ng/mL (ref 0.00–0.08)

## 2017-02-19 LAB — MAGNESIUM
Magnesium: 1.9 mg/dL (ref 1.7–2.4)
Magnesium: 1.8 mg/dL (ref 1.7–2.4)

## 2017-02-19 LAB — CBG MONITORING, ED: GLUCOSE-CAPILLARY: 71 mg/dL (ref 65–99)

## 2017-02-19 LAB — TSH: TSH: 0.356 u[IU]/mL (ref 0.350–4.500)

## 2017-02-19 LAB — MRSA PCR SCREENING: MRSA BY PCR: NEGATIVE

## 2017-02-19 MED ORDER — ONDANSETRON 4 MG PO TBDP: 4.0000 mg | ORAL_TABLET | Freq: Three times a day (TID) | ORAL | Status: DC | PRN

## 2017-02-19 MED ORDER — SODIUM CHLORIDE 0.9 % IV SOLN: Freq: Once | INTRAVENOUS | Status: DC

## 2017-02-19 MED ORDER — ACETAMINOPHEN 650 MG RE SUPP: 650.0000 mg | Freq: Four times a day (QID) | RECTAL | Status: DC | PRN

## 2017-02-19 MED ORDER — CHLORHEXIDINE GLUCONATE CLOTH 2 % EX PADS
6.0000 | MEDICATED_PAD | Freq: Every day | CUTANEOUS | Status: DC
  Administered 2017-02-20: 6 via TOPICAL

## 2017-02-19 MED ORDER — STERILE WATER FOR INJECTION IV SOLN
INTRAVENOUS | Status: DC
  Administered 2017-02-19 – 2017-02-20 (×2): via INTRAVENOUS
  Filled 2017-02-19 (×8): qty 850

## 2017-02-19 MED ORDER — SODIUM BICARBONATE 8.4 % IV SOLN
50.0000 meq | Freq: Once | INTRAVENOUS | Status: AC
  Administered 2017-02-19: 50 meq via INTRAVENOUS
  Filled 2017-02-19: qty 50

## 2017-02-19 MED ORDER — INSULIN ASPART 100 UNIT/ML ~~LOC~~ SOLN
10.0000 [IU] | Freq: Once | SUBCUTANEOUS | Status: AC
Start: 2017-02-19 — End: 2017-02-19
  Administered 2017-02-19: 10 [IU] via INTRAVENOUS
  Filled 2017-02-19: qty 1

## 2017-02-19 MED ORDER — PRAVASTATIN SODIUM 40 MG PO TABS
40.0000 mg | ORAL_TABLET | Freq: Every day | ORAL | Status: DC
Start: 2017-02-19 — End: 2017-02-19

## 2017-02-19 MED ORDER — SODIUM CHLORIDE 0.9 % IV SOLN
1.0000 g | Freq: Once | INTRAVENOUS | Status: AC
  Administered 2017-02-19: 1 g via INTRAVENOUS
  Filled 2017-02-19: qty 10

## 2017-02-19 MED ORDER — SODIUM CHLORIDE 0.9 % IV BOLUS (SEPSIS)
1000.0000 mL | Freq: Once | INTRAVENOUS | Status: AC
  Administered 2017-02-19: 1000 mL via INTRAVENOUS

## 2017-02-19 MED ORDER — PIPERACILLIN-TAZOBACTAM 3.375 G IVPB 30 MIN
3.3750 g | Freq: Once | INTRAVENOUS | Status: AC
  Administered 2017-02-19: 3.375 g via INTRAVENOUS
  Filled 2017-02-19: qty 50

## 2017-02-19 MED ORDER — DEXTROSE 5 % IV SOLN: 2.0000 g | Freq: Once | INTRAVENOUS | Status: DC

## 2017-02-19 MED ORDER — INSULIN ASPART 100 UNIT/ML IV SOLN: 10.0000 [IU] | Freq: Once | INTRAVENOUS | Status: DC

## 2017-02-19 MED ORDER — DEXTROSE 10 % IV SOLN
Freq: Once | INTRAVENOUS | Status: AC
  Administered 2017-02-19: 18:00:00 via INTRAVENOUS

## 2017-02-19 MED ORDER — PIPERACILLIN-TAZOBACTAM IN DEX 2-0.25 GM/50ML IV SOLN
2.2500 g | Freq: Three times a day (TID) | INTRAVENOUS | Status: DC
  Administered 2017-02-20 – 2017-02-21 (×4): 2.25 g via INTRAVENOUS
  Filled 2017-02-19 (×5): qty 50

## 2017-02-19 MED ORDER — ACETAMINOPHEN 325 MG PO TABS
650.0000 mg | ORAL_TABLET | Freq: Four times a day (QID) | ORAL | Status: DC | PRN
  Administered 2017-02-21: 650 mg via ORAL
  Filled 2017-02-19: qty 2

## 2017-02-19 MED ORDER — HEPARIN SODIUM (PORCINE) 5000 UNIT/ML IJ SOLN
5000.0000 [IU] | Freq: Three times a day (TID) | INTRAMUSCULAR | Status: DC
  Administered 2017-02-19 – 2017-02-21 (×5): 5000 [IU] via SUBCUTANEOUS
  Filled 2017-02-19 (×4): qty 1

## 2017-02-19 MED ORDER — LEVOTHYROXINE SODIUM 112 MCG PO TABS
112.0000 ug | ORAL_TABLET | Freq: Every day | ORAL | Status: DC
  Administered 2017-02-20 – 2017-02-21 (×2): 112 ug via ORAL
  Filled 2017-02-19 (×2): qty 1

## 2017-02-19 MED ORDER — SODIUM CHLORIDE 0.9% FLUSH
3.0000 mL | Freq: Two times a day (BID) | INTRAVENOUS | Status: DC
  Administered 2017-02-20 (×2): 3 mL via INTRAVENOUS

## 2017-02-19 MED ORDER — DEXTROSE 50 % IV SOLN
1.0000 | Freq: Once | INTRAVENOUS | Status: AC
Start: 2017-02-19 — End: 2017-02-19
  Administered 2017-02-19: 50 mL via INTRAVENOUS
  Filled 2017-02-19: qty 50

## 2017-02-19 MED ORDER — PIPERACILLIN-TAZOBACTAM IN DEX 2-0.25 GM/50ML IV SOLN: 2.2500 g | Freq: Once | INTRAVENOUS | Status: DC

## 2017-02-19 MED ORDER — SODIUM POLYSTYRENE SULFONATE 15 GM/60ML PO SUSP
60.0000 g | Freq: Once | ORAL | Status: AC
  Administered 2017-02-19: 60 g via ORAL
  Filled 2017-02-19: qty 240

## 2017-02-19 MED ORDER — ONDANSETRON HCL 4 MG/2ML IJ SOLN
4.0000 mg | Freq: Three times a day (TID) | INTRAMUSCULAR | Status: DC | PRN
  Administered 2017-02-19 – 2017-02-20 (×2): 4 mg via INTRAVENOUS
  Filled 2017-02-19 (×2): qty 2

## 2017-02-19 MED ORDER — SODIUM CHLORIDE 0.9% FLUSH: 10.0000 mL | INTRAVENOUS | Status: DC | PRN

## 2017-02-19 MED ORDER — SODIUM CHLORIDE 0.9% FLUSH
10.0000 mL | Freq: Two times a day (BID) | INTRAVENOUS | Status: DC
  Administered 2017-02-20: 10 mL

## 2017-02-19 MED ORDER — ALBUTEROL SULFATE (2.5 MG/3ML) 0.083% IN NEBU
10.0000 mg | INHALATION_SOLUTION | Freq: Once | RESPIRATORY_TRACT | Status: DC
  Filled 2017-02-19: qty 12

## 2017-02-19 MED ORDER — ALBUTEROL (5 MG/ML) CONTINUOUS INHALATION SOLN
10.0000 mg/h | INHALATION_SOLUTION | RESPIRATORY_TRACT | Status: DC
  Administered 2017-02-19: 10 mg/h via RESPIRATORY_TRACT

## 2017-02-19 NOTE — Consult Note (Signed)
HPI: I was asked by Dr. Nori Riis to see Kelly Ray who is a 76 y.o. female with AKI and hyperkalemia.  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  Over the years she has received CHOP/Rituxan as the basic protocol with addition of bendamustine, etoposide and external beam radiation.  Most recent chemo treatment ended in March 2018.  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 She also had a MDR E coli UTI. Treated with meropenem.  During that hospitalization she had AKI with creat peaking at 2.'29mg'$ /dl falling to 1.01 by 5/4.  On 6/8 creat was 1.9 and BUN 32.5. Today BUN is 74 and creat 4.0.  Of note, she has been taking Aleve regularly.  She has been weak and light headed. She has been taking 32mq daily potassium supplementation and Septra. She c/o reddish colored urine. She admits to poor PO intake. In the ED today she was hypotensive, c/o weakness.  EKG reportedly showed hyperacute T waves treated with the usual IV meds with improvement.  Renal was asked to assist with management.  Past Medical History:  Diagnosis Date  . Anemia    SECONDARY TO CHEMOTHERAPY  . Arthritis    Osteoarthritis  . B-cell lymphoma ([place] 05/04/11   Large B-cell lymphoma  . Cancer ([facility]    NHL  . Hx of radiation therapy 08/17/13- 09/03/13   right lower extremity- lymphoma  . Hypertension   . S/P knee replacement    Left knee  . Thyroid disease    Hyperthyroidism   Past Surgical History:  Procedure Laterality Date  . IR GENERIC HISTORICAL  07/13/2016   IR [place] VASC ACCESS RIGHT 07/13/2016 [facility]  . IR GENERIC HISTORICAL  07/13/2016   IR FLUORO GUIDE PORT INSERTION RIGHT 07/13/2016 [facility]  . NECK LESION BIOPSY Right 05/04/11   node biopsies  . TOTAL KNEE ARTHROPLASTY Left    x 2   Social History:  reports that she has never smoked.  She has never used smokeless tobacco. She reports that she does not drink alcohol or use drugs. Allergies:  Allergies  Allergen Reactions  . Lisinopril Cough   Family History  Problem Relation Age of Onset  . Cancer Mother        bladder  . Heart attack Father   . Cancer Brother        kidney    Medications: Prior to Admission:  (Not in a hospital admission) Scheduled:  ROS: as per HPI, frequent skin lesions followed by Derm with cutaneous lymphoma often dagnosed Blood pressure (!) 90/53, pulse 69, temperature 97.5 F (36.4 C), temperature source Oral, resp. rate (!) 22, height '5\' 3"'$  (1.6 m), weight 68.5 kg (151 lb), SpO2 100 %.  General appearance: alert and cooperative pale and weak appearing Head: Normocephalic, without obvious abnormality, atraumatic, scars on left forehead post derm eval Eyes: negative Ears: normal TM's and external ear canals both ears Nose: no discharge Throat: lips, mucosa, and tongue normal; teeth and gums normal Resp: clear to auscultation bilaterally Chest wall: no tenderness Cardio: regular rate and rhythm, S1, S2 normal, no murmur, click, rub or gallop GI: soft, non-tender; bowel sounds normal; no masses,  no organomegaly Extremities: edema tr to 1+ Skin: old scarring from derm procedures Neurologic: Grossly normal Results for orders placed or performed during the hospital encounter of 02/19/17 (from the past  48 hour(s))  Lipase, blood     Status: Abnormal   Collection Time: 02/19/17 12:57 PM  Result Value Ref Range   Lipase 92 (H) 11 - 51 U/L  Comprehensive metabolic panel     Status: Abnormal   Collection Time: 02/19/17 12:57 PM  Result Value Ref Range   Sodium 129 (L) 135 - 145 mmol/L   Potassium 7.4 (HH) 3.5 - 5.1 mmol/L    Comment: NO VISIBLE HEMOLYSIS CRITICAL RESULT CALLED TO, READ BACK BY AND VERIFIED WITH: CARLA PRICE,RN AT 1403 02/19/17 BY ZBEECH.    Chloride 101 101 - 111 mmol/L   CO2 18 (L) 22 - 32 mmol/L   Glucose, Bld 103  (H) 65 - 99 mg/dL   BUN 76 (H) 6 - 20 mg/dL   Creatinine, Ser 4.93 (H) 0.44 - 1.00 mg/dL   Calcium 9.5 8.9 - 55.2 mg/dL   Total Protein 6.6 6.5 - 8.1 g/dL   Albumin 3.1 (L) 3.5 - 5.0 g/dL   AST 30 15 - 41 U/L   ALT 33 14 - 54 U/L   Alkaline Phosphatase 143 (H) 38 - 126 U/L   Total Bilirubin 0.6 0.3 - 1.2 mg/dL   GFR calc non Af Amer 9 (L) >60 mL/min   GFR calc Af Amer 11 (L) >60 mL/min    Comment: (NOTE) The eGFR has been calculated using the CKD EPI equation. This calculation has not been validated in all clinical situations. eGFR's persistently <60 mL/min signify possible Chronic Kidney Disease.    Anion gap 10 5 - 15  CBC     Status: Abnormal   Collection Time: 02/19/17 12:57 PM  Result Value Ref Range   WBC 11.2 (H) 4.0 - 10.5 K/uL   RBC 3.70 (L) 3.87 - 5.11 MIL/uL   Hemoglobin 10.3 (L) 12.0 - 15.0 g/dL   HCT 17.4 (L) 71.5 - 95.3 %   MCV 87.0 78.0 - 100.0 fL   MCH 27.8 26.0 - 34.0 pg   MCHC 32.0 30.0 - 36.0 g/dL   RDW 96.7 (H) 28.9 - 79.1 %   Platelets 311 150 - 400 K/uL  Magnesium     Status: None   Collection Time: 02/19/17 12:57 PM  Result Value Ref Range   Magnesium 1.9 1.7 - 2.4 mg/dL  I-Stat Troponin, ED (not at [facility])     Status: None   Collection Time: 02/19/17  1:03 PM  Result Value Ref Range   Troponin i, poc 0.06 0.00 - 0.08 ng/mL   Comment 3            Comment: Due to the release kinetics of cTnI, a negative result within the first hours of the onset of symptoms does not rule out myocardial infarction with certainty. If myocardial infarction is still suspected, repeat the test at appropriate intervals.   I-Stat CG4 Lactic Acid, ED     Status: None   Collection Time: 02/19/17  1:05 PM  Result Value Ref Range   Lactic Acid, Venous 1.73 0.5 - 1.9 mmol/L  Magnesium     Status: None   Collection Time: 02/19/17  1:07 PM  Result Value Ref Range   Magnesium 1.8 1.7 - 2.4 mg/dL  CBG monitoring, ED     Status: None   Collection Time: 02/19/17  3:22 PM   Result Value Ref Range   Glucose-Capillary 71 65 - 99 mg/dL   Comment 1 Notify RN    Comment 2 Document in Chart   Urinalysis, Routine  w reflex microscopic     Status: Abnormal   Collection Time: 02/19/17  3:32 PM  Result Value Ref Range   Color, Urine YELLOW YELLOW   APPearance CLOUDY (A) CLEAR   Specific Gravity, Urine 1.010 1.005 - 1.030   pH 6.0 5.0 - 8.0   Glucose, UA 50 (A) NEGATIVE mg/dL   Hgb urine dipstick MODERATE (A) NEGATIVE   Bilirubin Urine NEGATIVE NEGATIVE   Ketones, ur NEGATIVE NEGATIVE mg/dL   Protein, ur 100 (A) NEGATIVE mg/dL   Nitrite NEGATIVE NEGATIVE   Leukocytes, UA LARGE (A) NEGATIVE   RBC / HPF TOO NUMEROUS TO COUNT 0 - 5 RBC/hpf   WBC, UA TOO NUMEROUS TO COUNT 0 - 5 WBC/hpf   Bacteria, UA RARE (A) NONE SEEN   Squamous Epithelial / LPF 0-5 (A) NONE SEEN   Mucous PRESENT    No results found.  Assessment:  1 AKI, hemodynamically mediated due to hypotension due to hypovolemia 2 Hyperkalemia due to AKI(decreased excretion) and endogenous intake 3 Recent UTI, prob persistent 4 Large B-cell lymphoma s/p chemo & radiation 5 Met acidosis  Rec: 1 IVF replacement with bicarb 2 Aggressive kayexylate therapy 3 No NSAIDS or bactrim 4 Kayexalate 60gm PO stat then 50gm Q 2 hours until K< 6 5 Stop PO KCl 6 Abd ultrasound: r/o obstructive uropathy, lymphomatous infiltration  Parks Czajkowski C 02/19/2017, 5:17 PM

## 2017-02-19 NOTE — Telephone Encounter (Signed)
Telephone call to patient regarding critically high potassium and creatinine. Patient husband answered the phone- patient is resting since she has been feeling so poorly. Husband advised emergency situation regarding potassium level. Husband was hesitant to bring patient to [facility] ED. He states he would like to have her rest for at least an hour. Strongly advised husband this was a dangerously high lab result and Dr. Benay Spice wants patient to go to the ED now. Husband was reluctant but finally agreed to wake her and bring to [facility] ED.

## 2017-02-19 NOTE — ED Notes (Signed)
Pts doctors office called this RN to give report that patient was being sent to the ER. Pt with PMHx of lymphoma went doctors office for abnormal urinary symptoms. Blood work revealed that patients potassium was 7.4 and her creatinine was 4.0. This is new for her.

## 2017-02-19 NOTE — H&P (Signed)
[facility] Admission History and Physical Service Pager: [phone]  Patient name: Kelly Ray Medical record number: [id number] Date of birth: 13-Jan-1941 Age: 76 y.o. Gender: female  Primary Care Provider: Forrest Moron, MD Consultants: Nephrology Code Status: DNR (confirmed on admission)  Chief Complaint: weakness  Assessment and Plan: [name] is a 76 y.o. female presenting with increased weakness and found to have hyperkalemia to 7.4 and hypotension to 70s/50s. PMH is significant for B-cell lymphoma, HTN, hypothyroidism, OA s/p L knee replacement, and panic attacks.   Hyperkalemia: In the setting of acute renal failure, decreased PO intake, and patient taking potassium supplementation. With peaked T-waves on EKG. - Admit to SDU, attending Dr. Nori Riis - Monitor on telemetry - s/p sodium bicarb, insulin, and IVFs in ED - Repeat CMP ordered - Ordered kayexalate 60 mg - Nephrology consulted, appreciate recommendations - Repeat kayexalate as often as q2h per Nephrology until gets K below 6.0 - Repeat EKG once on the floor - Hold home KCl  Hypotension with concern for developing sepsis: Did not meet sepsis criteria on admission but did have qSofa score of 2. BP to 70/50 in ED with improvement to 90s/50s after 3L of fluid. Poor PO intake recently. Normal mentation. Slight leukocytosis with WBC of 11.2. UA with large leuks, protein, moderate hgb, and glucose. UA earlier today without glucose. LA 1.73 in ED. Recent urine culture from 02/13/17 showed E coli UTI resistant to all but bactrim, meropenum, zosyn, nitrofurantoin; on day 6 of treatment with bactrim. Urine culture from 4/26 was ESBL E coli.  - Continue IVFs and bolus as needed - IV zosyn to cover for possible UTI - Transition to PO antibiotic with results of urine culture - Could consider nitrofurantoin for treatment given results of last urine culture but not at current reduced GFR - Follow-up  blood cultures  Acute kidney failure: SCr 4.31 compared to baseline of 1.0 seen in May 2018. With non-anion gap acidosis (CO2 18) suggestive of RTA. BUN 76; no asterixis on exam. Multiple possible etiologies include nephrotoxic medications (NSAIDs, bactrim) and hypoperfusion with low BP. Patient more at risk for AKI given recent history of AKI at end of April in setting of UTI (SCr to 2.29 12/21/16). Renal US 12/21/16 in setting of AKI was negative for hydronephrosis with normal sized kidneys.  - IVF replacement with sodium bicarbonate per Nephrology - Avoid nephrotoxic agents - Will obtain abdominal US per renal to r/o obstructive uropathy given cancer history  Hyponatremia: Na 129 in ED. Baseline in high 130s. In setting of acute kidney injury with likely ATN with hypovolemia and recent NSAID use and bactrim.  - Monitor for improvement with IVFs for now  Diffuse Large B Cell Lymphoma: Diagnosed in 2012. Has undergone chemotherapy with cycles of CHOP/rituxan and bendamustine/rituxan. She has had multiple cutaneous recurrences. She most recently completed 6 cycles of CHOP/rituxan in March. 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 She has seen Neurologist Dr. Rexene Alberts for neuropathy and weakness with most recent plan to focus on exercise and nutrition. Her Oncologist is Dr. Benay Spice.  - PT eval with continued complaint of weakness  HTN: Takes metoprolol 12.5 mg BID. Took dose this morning. Previously also on hyzaar, which was discontinued at discharge last hospitalization due to AKI.  - Holding while hypotensive.   Hypothyroidism: Last TSH 0.282 12/12/2016.  - Obtain TSH - Continue home synthroid 112 mcg qAC  HLD: Takes pravastatin 40 mg qhs.  - Hold until SCr improves.   Panic attacks: Previously had tried xanax but did not feel it helped. Not currently on medication for this. Last  episode about 3 days ago.   FEN/GI: regular diet Prophylaxis: heparin  Disposition: SDU for close monitoring while hypotensive  History of Present Illness:  Kelly Ray is a 76 y.o. female presenting with increased weakness. She reports she had been doing well about 2 weeks ago and was even able to get around without her walker but has since become increasingly tired and has not wanted to get out of bed. She reports increased urinary frequency x 1 week. She has had pink-tinged urine for the past 1.5 days. She denies dysuria. She denies fevers or chills. She has been taking bactrim and is on day 6 of 7 and notes improvement in pounding headache she had prior to starting medication but no improvement in urinary frequency. She has had decreased appetite, though husband says she has done a little better recently taking fluids--ensure, boost, gingerale and water. She has not had nausea but does report spitting up some phlegm yesterday. She had some abdominal discomfort this afternoon and took pepto-bismol. Additionally, she has been regularly taking aleve (about 2 times per day) and aleve pm to try to help with worsening insomnia. She has had falls during which she feels weak and she "crumple to the ground," per husband, without loss of consciousness or hitting head. 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 She had been having PT at the home but has recently had trouble with orders to continue this.   In the ED, patient was found to be hyperkalemic to 7.4 with hyperacute T waves. She was given sodium bicarb, 10 U of novolog with dextrose, and 1 g calcium chloride. She also received 3L of fluid for hypotension with some improvement. She was started on zosyn for UA concerning for infection, given previous culture showing multiple resistances. Patient uncertain why she is on kdur at home but has been taking it.  Review Of Systems: Per HPI with the following additions:    Review of Systems  Constitutional: Positive for malaise/fatigue. Negative for chills and fever.  HENT: Negative for congestion and ear pain.   Eyes: Negative for blurred vision and double vision.  Respiratory: Negative for cough and shortness of breath.   Cardiovascular: Negative for chest pain and palpitations.  Gastrointestinal: Positive for abdominal pain. Negative for nausea and vomiting.  Genitourinary: Positive for frequency. Negative for dysuria and flank pain.  Musculoskeletal: Positive for falls. Negative for back pain.  Skin: Negative for rash.  Neurological: Positive for dizziness and weakness. Negative for focal weakness.  Psychiatric/Behavioral: Positive for memory loss. The patient has insomnia.     Patient Active Problem List   Diagnosis Date Noted  . Sepsis ([place]) 02/19/2017  . UTI due to extended-spectrum beta lactamase (ESBL) producing Escherichia coli   . AKI (acute kidney injury) ([place])   . Ataxia 12/19/2016  . Acute lower UTI   . Peripheral edema 11/27/2016  . Hypotension 08/21/2016  . Dehydration 08/21/2016  . Antineoplastic chemotherapy induced pancytopenia (CODE) ([place]) 08/21/2016  . Esophagitis 08/21/2016  . UTI (urinary tract infection) 08/21/2016  . Port catheter in place 08/17/2016  . Diffuse large B-cell lymphoma of lymph nodes of lower extremity ([place]) 12/27/2015  . Hypercholesterolemia 08/06/2013  . Hypertension 08/06/2013  . Hypothyroid 08/06/2013  . B-cell lymphoma ([place]) 07/17/2011  Past Medical History: Past Medical History:  Diagnosis Date  . Anemia    SECONDARY TO CHEMOTHERAPY  . Arthritis    Osteoarthritis  . B-cell lymphoma ([place]) 05/04/11   Large B-cell lymphoma  . Cancer ([place])    NHL  . Hx of radiation therapy 08/17/13- 09/03/13   right lower extremity- lymphoma  . Hypertension   . S/P knee replacement    Left knee  . Thyroid disease    Hyperthyroidism    Past Surgical History: Past Surgical History:  Procedure Laterality  Date  . IR GENERIC HISTORICAL  07/13/2016   IR US GUIDE VASC ACCESS RIGHT 07/13/2016 [facility]  . IR GENERIC HISTORICAL  07/13/2016   IR FLUORO GUIDE PORT INSERTION RIGHT 07/13/2016 [facility]  . NECK LESION BIOPSY Right 05/04/11   node biopsies  . TOTAL KNEE ARTHROPLASTY Left    x 2    Social History: Social History  Substance Use Topics  . Smoking status: Never Smoker  . Smokeless tobacco: Never Used  . Alcohol use No   Additional social history: Lives with husband. No illegal drug use.  Please also refer to relevant sections of EMR.  Family History: Family History  Problem Relation Age of Onset  . Cancer Mother        bladder  . Heart attack Father   . Cancer Brother        kidney  Reports brother had kidney failure related to diabetes.   Allergies and Medications: Allergies  Allergen Reactions  . Lisinopril Cough   No current facility-administered medications on file prior to encounter.    Current Outpatient Prescriptions on File Prior to Encounter  Medication Sig Dispense Refill  . ALPRAZolam (XANAX) 0.25 MG tablet Take 1 tablet (0.25 mg total) by mouth at bedtime as needed for anxiety. 30 tablet 0  . levothyroxine (SYNTHROID, LEVOTHROID) 112 MCG tablet Take 1 tablet daily 90 tablet 3  . metoprolol tartrate (LOPRESSOR) 25 MG tablet Take 1 tablet (25 mg total) by mouth 2 (two) times daily. (Patient taking differently: Take 12.5 mg by mouth 2 (two) times daily. ) 180 tablet 3  . pravastatin (PRAVACHOL) 40 MG tablet Take 1 tablet (40 mg total) by mouth at bedtime. 30 tablet 0  . sulfamethoxazole-trimethoprim (BACTRIM DS,SEPTRA DS) 800-160 MG tablet Take one pill twice a day times seven days 14 tablet 0    Objective: BP (!) 76/39 (BP Location: Right Arm)   Pulse 86   Temp 97.9 F (36.6 C) (Oral)   Resp (!) 22   Ht 5\' 2"  (1.575 m)   Wt 152 lb 1.9 oz (69 kg)   SpO2 95%   BMI 27.82 kg/m  Exam: General: Pleasant female, resting in bed, in NAD Eyes: PERRL,  EOMI ENTM: MM tacky, normal posterior oropharynx Neck: Supple, FROM Cardiovascular: RRR, S1, S2, no mrg Respiratory: CTAB, no increased WOB Gastrointestinal: +BS, soft, NT, ND.  MSK: Strength 5-/5 of LEs. Normal tone. TTP over anterior L tibia without mass or wound. No CVA tenderness to percussion.  Derm: Abrasions on L elbow and knees. Patch of irritation under panus of L groin. Scar over L knee.  Neuro: AOx3 (though corrected year from 1918 to 2018). No focal deficits. No asterixis.  Psych: Normal mood and affect.   Labs and Imaging: CBC BMET   Recent Labs Lab 02/19/17 1257  WBC 11.2*  HGB 10.3*  HCT 32.2*  PLT 311    Recent Labs Lab 02/19/17 1257  NA 129*  K 7.4*  CL 101  CO2 18*  BUN 76*  CREATININE 4.31*  GLUCOSE 103*  CALCIUM 9.5      Olene Floss Mulliken, MD 02/19/2017, 7:24 PM PGY-2, [place] Intern pager: [phone], text pages welcome

## 2017-02-19 NOTE — ED Provider Notes (Signed)
[facility] DEPT Provider Note   CSN: [id number] Arrival date & time: 02/19/17  1232     History   Chief Complaint Chief Complaint  Patient presents with  . Abnormal Lab    HPI Kelly Ray is a 76 y.o. female.  HPI Kelly Ray is a 76 y.o. female with history of B-cell lymphoma, last radiation 1 month ago, presents to emergency department complaining of generalized weakness. Patient was sent here from [place] for critically high potassium of 7.8, and creatinine of 4. She was treated for UTI 1 week ago. She is currently on antibiotics, taking Bactrim, states went back for recheck today and had blood work done. Patient is complaining of generalized weakness, worse in the last 3 days. States she has no energy. Unable to ambulate without assistance. Feels dizzy. Denied chest pain, abdominal pain, back pain. No headache or neck pain. No fevers at home.  Past Medical History:  Diagnosis Date  . Anemia    SECONDARY TO CHEMOTHERAPY  . Arthritis    Osteoarthritis  . B-cell lymphoma ([place]) 05/04/11   Large B-cell lymphoma  . Cancer ([place])    NHL  . Hx of radiation therapy 08/17/13- 09/03/13   right lower extremity- lymphoma  . Hypertension   . S/P knee replacement    Left knee  . Thyroid disease    Hyperthyroidism    Patient Active Problem List   Diagnosis Date Noted  . UTI due to extended-spectrum beta lactamase (ESBL) producing Escherichia coli   . AKI (acute kidney injury) ([place])   . Ataxia 12/19/2016  . Acute lower UTI   . Peripheral edema 11/27/2016  . Hypotension 08/21/2016  . Dehydration 08/21/2016  . Antineoplastic chemotherapy induced pancytopenia (CODE) ([place]) 08/21/2016  . Esophagitis 08/21/2016  . UTI (urinary tract infection) 08/21/2016  . Port catheter in place 08/17/2016  . 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 Hypercholesterolemia 08/06/2013  . Hypertension 08/06/2013  . Hypothyroid 08/06/2013  .  B-cell lymphoma ([place]) 07/17/2011    Past Surgical History:  Procedure Laterality Date  . IR GENERIC HISTORICAL  07/13/2016   IR US GUIDE VASC ACCESS RIGHT 07/13/2016 [facility]  . IR GENERIC HISTORICAL  07/13/2016   IR FLUORO GUIDE PORT INSERTION RIGHT 07/13/2016 [facility]  . NECK LESION BIOPSY Right 05/04/11   node biopsies  . TOTAL KNEE ARTHROPLASTY Left    x 2    OB History    No data available       Home Medications    Prior to Admission medications   Medication Sig Start Date End Date Taking? Authorizing Provider  ALPRAZolam (XANAX) 0.25 MG tablet Take 1 tablet (0.25 mg total) by mouth at bedtime as needed for anxiety. Patient not taking: Reported on 02/01/2017 01/17/17   [name], MD  levothyroxine Wilmer Floor, LEVOTHROID) 112 MCG tablet Take 1 tablet daily 05/08/16   Darlyne Russian, MD  metoprolol tartrate (LOPRESSOR) 25 MG tablet Take 1 tablet (25 mg total) by mouth 2 (two) times daily. Patient taking differently: Take 12.5 mg by mouth 2 (two) times daily.  05/07/16   Darlyne Russian, MD  Naproxen Sod-Diphenhydramine (ALEVE PM) 220-25 MG TABS Take 2 tablets by mouth at bedtime as needed.    [provider]  pravastatin (PRAVACHOL) 40 MG tablet Take 1 tablet (40 mg total) by mouth at bedtime. 12/23/16   Rosita Fire, MD  sulfamethoxazole-trimethoprim (BACTRIM DS,SEPTRA DS) 800-160 MG tablet  Take one pill twice a day times seven days 02/13/17   [name], MD    Family History Family History  Problem Relation Age of Onset  . Cancer Mother        bladder  . Heart attack Father   . Cancer Brother        kidney    Social History Social History  Substance Use Topics  . Smoking status: Never Smoker  . Smokeless tobacco: Never Used  . Alcohol use No     Allergies   Lisinopril   Review of Systems Review of Systems  Constitutional: Positive for fatigue. Negative for chills and fever.  Respiratory: Negative for cough, chest  tightness and shortness of breath.   Cardiovascular: Negative for chest pain, palpitations and leg swelling.  Gastrointestinal: Negative for abdominal pain, diarrhea, nausea and vomiting.  Genitourinary: Negative for dysuria, flank pain and pelvic pain.  Musculoskeletal: Negative for arthralgias, myalgias, neck pain and neck stiffness.  Skin: Negative for rash.  Neurological: Positive for weakness. Negative for dizziness and headaches.  All other systems reviewed and are negative.    Physical Exam Updated Vital Signs BP (!) 84/53   Pulse 71   Temp 97.5 F (36.4 C) (Oral)   Resp (!) 23   Ht 5\' 3"  (1.6 m)   Wt 68.5 kg (151 lb)   SpO2 100%   BMI 26.75 kg/m   Physical Exam  Constitutional: She appears well-developed and well-nourished. No distress.  HENT:  Head: Normocephalic.  Oral mucosa dry  Eyes: Conjunctivae are normal.  Neck: Neck supple.  Cardiovascular: Normal rate and regular rhythm.   Murmur heard. Pulmonary/Chest: Effort normal and breath sounds normal. No respiratory distress. She has no wheezes. She has no rales.  Abdominal: Soft. Bowel sounds are normal. She exhibits no distension. There is no tenderness. There is no rebound.  Musculoskeletal: She exhibits no edema.  Neurological: She is alert.  Skin: Skin is warm and dry.  Psychiatric: She has a normal mood and affect. Her behavior is normal.  Nursing note and vitals reviewed.    ED Treatments / Results  Labs (all labs ordered are listed, but only abnormal results are displayed) Labs Reviewed  LIPASE, BLOOD - Abnormal; Notable for the following:       Result Value   Lipase 92 (*)    All other components within normal limits  COMPREHENSIVE METABOLIC PANEL - Abnormal; Notable for the following:    Sodium 129 (*)    Potassium 7.4 (*)    CO2 18 (*)    Glucose, Bld 103 (*)    BUN 76 (*)    Creatinine, Ser 4.31 (*)    Albumin 3.1 (*)    Alkaline Phosphatase 143 (*)    GFR calc non Af Amer 9 (*)    GFR  calc Af Amer 11 (*)    All other components within normal limits  CBC - Abnormal; Notable for the following:    WBC 11.2 (*)    RBC 3.70 (*)    Hemoglobin 10.3 (*)    HCT 32.2 (*)    RDW 18.0 (*)    All other components within normal limits  URINALYSIS, ROUTINE W REFLEX MICROSCOPIC - Abnormal; Notable for the following:    APPearance CLOUDY (*)    Glucose, UA 50 (*)    Hgb urine dipstick MODERATE (*)    Protein, ur 100 (*)    Leukocytes, UA LARGE (*)    Bacteria, UA RARE (*)  Squamous Epithelial / LPF 0-5 (*)    All other components within normal limits  CULTURE, BLOOD (ROUTINE X 2)  CULTURE, BLOOD (ROUTINE X 2)  MAGNESIUM  MAGNESIUM  GLUCOSE, RANDOM  COMPREHENSIVE METABOLIC PANEL  I-STAT CG4 LACTIC ACID, ED  I-STAT TROPOININ, ED  CBG MONITORING, ED    EKG  EKG Interpretation  Date/Time:  Tuesday February 19 2017 12:36:03 EDT Ventricular Rate:  81 PR Interval:  210 QRS Duration: 114 QT Interval:  390 QTC Calculation: 453 R Axis:   -25 Text Interpretation:  Sinus rhythm with 1st degree A-V block changes from prior suggestive of hyperkalemia Confirmed by Wilson Singer  MD, STEPHEN ([id number]) on 02/19/2017 1:09:03 PM       Radiology No results found.  Procedures Procedures (including critical care time)  CRITICAL CARE Performed by: Jeannett Senior A Total critical care time: 30 minutes Critical care time was exclusive of separately billable procedures and treating other patients. Critical care was necessary to treat or prevent imminent or life-threatening deterioration. Critical care was time spent personally by me on the following activities: development of treatment plan with patient and/or surrogate as well as nursing, discussions with consultants, evaluation of patient's response to treatment, examination of patient, obtaining history from patient or surrogate, ordering and performing treatments and interventions, ordering and review of laboratory studies, ordering and  review of radiographic studies, pulse oximetry and re-evaluation of patient's condition.  Medications Ordered in ED Medications  sodium chloride 0.9 % bolus 1,000 mL (not administered)     Initial Impression / Assessment and Plan / ED Course  I have reviewed the triage vital signs and the nursing notes.  Pertinent labs & imaging results that were available during my care of the patient were reviewed by me and considered in my medical decision making (see chart for details).     Patient with apparently critical high potassium of 7.2 today and creat of 4. Will recheck. ECG with some peaked twaves, will initiate treatment with bicarb, insulin, d50, albuterol, calcium. Discussed with Dr. Wilson Singer who will see pt. IV fluids started. Labs pending.    2:53 PM BP continues to be in 20U and 54Y systolic. Only had 1L so far. Will give two more and reassess. Labs showing K of 7.4 here with no hemolysis. Creat of 4.3. Pt denies any pain anywhere.   3:45 PM BP improving, in 70W systolic. Will continue hydration.   Discussed with family practice, will admit. Asked to consult nephrology.   4:51 PM Spoke with nephrology, Dr. Florene Glen, will come by and see pt.   Vitals:   02/19/17 1400 02/19/17 1411 02/19/17 1500 02/19/17 1530  BP: (!) 82/66  (!) 77/56 (!) 90/53  Pulse: 67  69   Resp: 19  18 (!) 22  Temp:      TempSrc:      SpO2: 100% 100% 100% 100%  Weight:      Height:        Final Clinical Impressions(s) / ED Diagnoses   Final diagnoses:  Hyperkalemia  Acute renal failure, unspecified acute renal failure type [facility])  Dehydration    New Prescriptions New Prescriptions   No medications on file     Jeannett Senior, Hershal Coria 02/19/17 1653    Virgel Manifold, MD 03/02/17 [phone]

## 2017-02-19 NOTE — ED Notes (Signed)
Pt CBG was 71, notified Carla(RN)

## 2017-02-19 NOTE — Progress Notes (Signed)
Patient took about 120 cc of kayexalate then threw up about 100 cc per her nurse. Ordered prn zofran. Will recheck K around midnight and determine whether she needs to re-attempt. Peaked T-waves resolved on repeat EKG. Saw patient at bedside, and she is having sensation she needs to urinate but unable to fully empty her bladder. Bladder scan showed about 350 cc. Patient with option of in and out cath if repeat is greater than 500 cc or for comfort.  RN has noted tachycardia to 110s/120s and patient has dizziness when ambulates with assistance, so patient is voiding via bedpan for the time being. Most recent BP 98/62. Patient with normal mentation. To be NPO at midnight for abdominal US.  Olene Floss, MD [place], PGY-2

## 2017-02-19 NOTE — ED Triage Notes (Signed)
Pt reports to the ED for eval of abnormal lab. She was dx with a UTI 6 days ago and she has been compliant with all of her abx but she has continued to get weaker. She was seen by the cancer center today and had blood work and urine done and they called her husband and told him to bring her to the ED. They believe her K+ was 7.8 mg/dl. He also states another lab was abnormal but was unable to specify what it was. Last chemo tx was May 2nd. She also has some abd pain.

## 2017-02-20 ENCOUNTER — Ambulatory Visit: Payer: Medicare Other | Admitting: Neurology

## 2017-02-20 ENCOUNTER — Inpatient Hospital Stay (HOSPITAL_COMMUNITY): Payer: Medicare Other

## 2017-02-20 DIAGNOSIS — L8990 Pressure ulcer of unspecified site, unspecified stage: Secondary | ICD-10-CM | POA: Diagnosis present

## 2017-02-20 DIAGNOSIS — E875 Hyperkalemia: Secondary | ICD-10-CM

## 2017-02-20 DIAGNOSIS — L89012 Pressure ulcer of right elbow, stage 2: Secondary | ICD-10-CM

## 2017-02-20 DIAGNOSIS — E860 Dehydration: Secondary | ICD-10-CM

## 2017-02-20 DIAGNOSIS — N179 Acute kidney failure, unspecified: Secondary | ICD-10-CM

## 2017-02-20 LAB — CBC
HEMATOCRIT: 23.2 % — AB (ref 36.0–46.0)
HEMOGLOBIN: 7.2 g/dL — AB (ref 12.0–15.0)
MCH: 27.2 pg (ref 26.0–34.0)
MCHC: 31 g/dL (ref 30.0–36.0)
MCV: 87.5 fL (ref 78.0–100.0)
Platelets: 170 10*3/uL (ref 150–400)
RBC: 2.65 MIL/uL — AB (ref 3.87–5.11)
RDW: 18.4 % — ABNORMAL HIGH (ref 11.5–15.5)
WBC: 4.5 10*3/uL (ref 4.0–10.5)

## 2017-02-20 LAB — BASIC METABOLIC PANEL
ANION GAP: 8 (ref 5–15)
BUN: 56 mg/dL — ABNORMAL HIGH (ref 6–20)
CALCIUM: 7.9 mg/dL — AB (ref 8.9–10.3)
CO2: 21 mmol/L — AB (ref 22–32)
Chloride: 107 mmol/L (ref 101–111)
Creatinine, Ser: 3.47 mg/dL — ABNORMAL HIGH (ref 0.44–1.00)
GFR calc Af Amer: 14 mL/min — ABNORMAL LOW (ref >60)
GFR calc non Af Amer: 12 mL/min — ABNORMAL LOW (ref >60)
GLUCOSE: 131 mg/dL — AB (ref 65–99)
Potassium: 5 mmol/L (ref 3.5–5.1)
Sodium: 136 mmol/L (ref 135–145)

## 2017-02-20 LAB — RENAL FUNCTION PANEL
ANION GAP: 8 (ref 5–15)
Albumin: 2 g/dL — ABNORMAL LOW (ref 3.5–5.0)
BUN: 52 mg/dL — AB (ref 6–20)
CHLORIDE: 106 mmol/L (ref 101–111)
CO2: 22 mmol/L (ref 22–32)
Calcium: 7.5 mg/dL — ABNORMAL LOW (ref 8.9–10.3)
Creatinine, Ser: 3.25 mg/dL — ABNORMAL HIGH (ref 0.44–1.00)
GFR calc Af Amer: 15 mL/min — ABNORMAL LOW (ref >60)
GFR, EST NON AFRICAN AMERICAN: 13 mL/min — AB (ref >60)
Glucose, Bld: 109 mg/dL — ABNORMAL HIGH (ref 65–99)
Phosphorus: 3.8 mg/dL (ref 2.5–4.6)
Potassium: 4.8 mmol/L (ref 3.5–5.1)
Sodium: 136 mmol/L (ref 135–145)

## 2017-02-20 MED ORDER — SODIUM CHLORIDE 0.9 % IV BOLUS (SEPSIS)
1000.0000 mL | Freq: Once | INTRAVENOUS | Status: AC
  Administered 2017-02-20: 1000 mL via INTRAVENOUS

## 2017-02-20 MED ORDER — ENSURE ENLIVE PO LIQD: 237.0000 mL | Freq: Two times a day (BID) | ORAL | Status: DC

## 2017-02-20 NOTE — Discharge Summary (Signed)
[facility] Discharge Summary  Patient name: Kelly Ray Medical record number: [id number] Date of birth: 05/10/1941 Age: 76 y.o. Gender: female Date of Admission: 02/19/2017  Date of Discharge: 02/21/17 Admitting Physician: Dickie La, MD  Primary Care Provider: Forrest Moron, MD Consultants: Nephrology   Indication for Hospitalization: Hyperkalemia  Discharge Diagnoses/Problem List:  Patient Active Problem List   Diagnosis Date Noted  . Pressure injury of skin 02/20/2017  . Acute renal failure ([place])   . Hyperkalemia   . Sepsis ([place]) 02/19/2017  . UTI due to extended-spectrum beta lactamase (ESBL) producing Escherichia coli   . AKI (acute kidney injury) ([place])   . Ataxia 12/19/2016  . Acute lower UTI   . Peripheral edema 11/27/2016  . Hypotension 08/21/2016  . Dehydration 08/21/2016  . Antineoplastic chemotherapy induced pancytopenia (CODE) ([place]) 08/21/2016  . Esophagitis 08/21/2016  . UTI (urinary tract infection) 08/21/2016  . Port catheter in place 08/17/2016  . 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 Hypercholesterolemia 08/06/2013  . Hypertension 08/06/2013  . Hypothyroid 08/06/2013  . B-cell lymphoma ([place]) 07/17/2011     Disposition: Home with HHPT  Discharge Condition: Stable, improved   Discharge Exam: Please see progress note from day of discharge   Brief Hospital Course:  Kelly Ray a 76 y.o.femalePMH significant for B-cell lymphoma, HTN, hyperthyroidism, OA s/p L knee replacement, and panic attacks who presented with increased weakness and found to have hyperkalemia and hypotension.  Patient had increasing weakness in the setting of increased urinary frequency on bactrim for 6 days for UTI, decreased po intake, and concurrent aleve and aleve pm with Kdur as outpatient. She was found to have hyperkalemia to 7.4 and hypotension to 70s/50s with acute renal failure to SCr  4.31 withnon-anion gap acidosis, compared to baseline of 1.0 seen in May 2018. She had a recent history of AKI at end of April in setting of UTI (SCr to 2.29 12/21/16). Nephrology was consulted and recommended aggressive fluid resuscitation with sodium bicarbonate. Patient was also broadened to IV zosyn for UTI given slight leukocytosis with WBC of 11.2 and h/o MDR and ESBL E Coli from previous urine cultures. She improved clinically, had stable vital signs and felt well with improving SCr. She was deemed stable for discharge home off ABX since she had completed taking 6 days of bactrim and 2 days of IV zosyn and repeat urine culture during hospital stay was reincubated for better growth. PT/OT evaluated patient and recommended HHPT.   Issues for Follow Up:  1. Please follow up on urine culture obtained 02/19/17, currently reincubated for better growth. 2. Recheck kidney function in approximately 6 weeks. Per Dr Florene Glen Nephrology, if remains abnormal then happy to see.  Significant Procedures: none  Significant Labs and Imaging:   Recent Labs Lab 02/19/17 1257 02/20/17 0446 02/21/17 0321  WBC 11.2* 4.5 4.9  HGB 10.3* 7.2* 7.3*  HCT 32.2* 23.2* 22.8*  PLT 311 170 171    Recent Labs Lab 02/19/17 1257 02/19/17 1307 02/19/17 1942 02/20/17 0043 02/20/17 0446 02/21/17 0321  NA 129*  --  133* 136 136 137  K 7.4*  --  5.0 5.0 4.8 3.8  CL 101  --  107 107 106 97*  CO2 18*  --  16* 21* 22 29  GLUCOSE 103*  --  145* 131* 109* 90  BUN 76*  --  65* 56* 52* 34*  CREATININE 4.31*  --  3.70* 3.47* 3.25* 2.75*  CALCIUM 9.5  --  8.1* 7.9* 7.5* 7.4*  MG 1.9 1.8  --   --   --   --   PHOS  --   --   --   --  3.8 3.4  ALKPHOS 143*  --  107  --   --   --   AST 30  --  27  --   --   --   ALT 33  --  28  --   --   --   ALBUMIN 3.1*  --  2.2*  --  2.0* 1.9*    Urine culture 02/14/15 EColi Amoxicillin/Clavulanic Acid  I  Ampicillin           R  Cefazolin           R   Cefepime            R  Ceftriaxone          R  Cefuroxime           R  Ciprofloxacin         R  Ertapenem           S  Gentamicin           R  Imipenem            S  Levofloxacin          R  Meropenem           S  Nitrofurantoin         S  Piperacillin/Tazobactam    S  Tetracycline          R  Tobramycin           R  Trimethoprim/Sulfa       S    Results/Tests Pending at Time of Discharge: Urine culture from 02/19/17.  Discharge Medications:  Allergies as of 02/21/2017      Reactions   Lisinopril Cough      Medication List    STOP taking these medications   potassium chloride SA 20 MEQ tablet Commonly known as:  K-DUR,KLOR-CON   sulfamethoxazole-trimethoprim 800-160 MG tablet Commonly known as:  BACTRIM DS,SEPTRA DS     TAKE these medications   ALPRAZolam 0.25 MG tablet Commonly known as:  XANAX Take 1 tablet (0.25 mg total) by mouth at bedtime as needed for anxiety.   levothyroxine 112 MCG tablet Commonly known as:  SYNTHROID, LEVOTHROID Take 1 tablet daily   metoprolol tartrate 25 MG tablet Commonly known as:  LOPRESSOR Take 0.5 tablets (12.5 mg total) by mouth 2 (two) times daily.   pravastatin 40 MG tablet Commonly known as:  PRAVACHOL Take 1 tablet (40 mg total) by mouth at bedtime.   VITAMIN B-12 PO Take 1 tablet by mouth daily.       Discharge Instructions: Please refer to Patient Instructions section of EMR for full details.  Patient was counseled important signs and symptoms that should prompt return to medical care, changes in medications, dietary instructions, activity restrictions, and follow up appointments.   Follow-Up Appointments: Follow-up Information    Forrest Moron, MD. Call in 1 week(s).   Specialty:  Internal Medicine Why:  Please make a hospital follow up appointment to be seen within 1 week of  discharge. Contact information: [place] [id number] [place], [place], DO 02/21/2017, 2:05 PM PGY-1, [place]  Family Medicine

## 2017-02-20 NOTE — Progress Notes (Signed)
Family Medicine Teaching Service Daily Progress Note Intern Pager: [phone]  Patient name: Kelly Ray Medical record number: [id number] Date of birth: 30-Mar-1941 Age: 76 y.o. Gender: female  Primary Care Provider: Forrest Moron, MD Consultants: Nephrology Code Status: DNR   Pt Overview and Major Events to Date:  6/26 Admitted for weakness and hyperkalemia  Assessment and Plan: Teiara Baria is a 76 y.o. female presenting with increased weakness and found to have hyperkalemia to 7.4 and hypotension to 70s/50s. PMH is significant for B-cell lymphoma, HTN, hyperthyroidism, OA s/p L knee replacement, and panic attacks.   Hypotension with concern for developing sepsis, improving: BP to 74/59 overnight with improvement after additional fluid bolus to 94/55 this am, MAP 67. Normal mentation. Afebrile. Leukocytosis resolved WBC 11.2>4.5 though likely dilutional component. Likely source UTI with h/o MDR E coli from 02/13/17 and ESBL E coli in 12/20/16. - Continue IVFs and bolus as needed - IV zosyn to cover for possible UTI - Transition to PO antibiotic with results of urine culture - Follow-up blood cultures  Acute kidney failure, improving: SCr improved from 4.31 to 3.25, baseline of 1.0 seen in May 2018. CO2 improved 18>22 and BUN 76>52.  Multiple etiologies include nephrotoxic medications (NSAIDs, bactrim) and hypoperfusion with low BP. Increased risk given recent AKI at end of April in setting of UTI (SCr to 2.29 12/21/16). Renal US 12/21/16 in setting of AKI was negative for hydronephrosis with normal sized kidneys.  - Nephrology following, appreciate recommendations - IVF replacement with sodium bicarbonate per Nephrology - Avoid nephrotoxic agents - Abdominal US per renal to r/o obstructive uropathy given cancer history  Anemia. Likely 2/2 dilutional given all cell lines decreased with aggressive IV hydration. Hgb this am down to 7.2 from 10.3 on admission. Asymptomatic. - monitor  CBC  Hyperkalemia, resolved: K 7.4>4.8, in the setting of acute renal failure, decreased PO intake, and patient taking potassium supplementation. Initial EKG with peaked T-waves, resolved on repeat with normal K. - Monitor on telemetry - s/p sodium bicarb, insulin, IVFs in ED and kayexalate x1 overnight. - Repeat CMP ordered - Nephrology consulted, appreciate recommendations - Hold home KCl  Hyponatremia, resolved: Na 129>136. In setting of acute kidney injury with likely ATN with hypovolemia and recent NSAID use and bactrim. Improved with IVF - Monitor   Diffuse Large B Cell Lymphoma: Diagnosed in 2012. Chemotherapy with cycles of CHOP/rituxan and bendamustine/rituxan. S/p 6 cycles of CHOP/rituxan in March. She has had multiple cutaneous recurrences. 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 Her Oncologist is Dr. Benay Spice.  - PT eval   HTN: Takes metoprolol 12.5 mg BID. Previously also on hyzaar, which was discontinued at discharge last hospitalization due to AKI.  - Holding while hypotensive.  - monitor BP  Hypothyroidism: TSH 0.356 WNL - Continue home synthroid 112 mcg qAC  HLD: Takes pravastatin 40 mg qhs.  - Hold until SCr improves.   Panic attacks: Previously had tried xanax but did not feel it helped. Not currently on medication for this. Last episode about 3 days ago.   FEN/GI: regular diet Prophylaxis: heparin  Disposition: pending medical improvement  Subjective:  Feels well this morning and much improved. No weakness. Has been urinating frequently but attributes this to being on IVF. No dysuria or sensation of incomplete emptying. No fever/chills, abd  pain, n/v, constipation.   Objective: Temp:  [97.5 F (36.4 C)-98.1 F (36.7 C)] 98.1  F (36.7 C) (06/27 0300) Pulse Rate:  [67-100] 96 (06/27 0300) Resp:  [12-34] 20 (06/27 0300) BP: (70-105)/(39-66) 105/54 (06/27 0300) SpO2:  [81 %-100 %] 100 % (06/27 0300) Weight:  [151 lb (68.5 kg)-152 lb 1.9 oz (69 kg)] 152 lb 1.9 oz (69 kg) (06/26 1822) Physical Exam: General: laying in bed comfortably, in NAd Cardiovascular: RRR, no murmurs Respiratory: CTAB, normal effort on room air Abdomen: soft, nontender, nondistended, + bowel sounds Extremities: no LE edema  Laboratory:  Recent Labs Lab 02/19/17 1022 02/19/17 1257 02/20/17 0446  WBC 10.9* 11.2* 4.5  HGB 10.1* 10.3* 7.2*  HCT 31.5* 32.2* 23.2*  PLT 305 311 170    Recent Labs Lab 02/19/17 1257 02/19/17 1942 02/20/17 0043 02/20/17 0446  NA 129* 133* 136 136  K 7.4* 5.0 5.0 4.8  CL 101 107 107 106  CO2 18* 16* 21* 22  BUN 76* 65* 56* 52*  CREATININE 4.31* 3.70* 3.47* 3.25*  CALCIUM 9.5 8.1* 7.9* 7.5*  PROT 6.6 4.6*  --   --   BILITOT 0.6 0.7  --   --   ALKPHOS 143* 107  --   --   ALT 33 28  --   --   AST 30 27  --   --   GLUCOSE 103* 145* 131* 109*   Urinalysis    Component Value Date/Time   COLORURINE YELLOW 02/19/2017 1532   APPEARANCEUR CLOUDY (A) 02/19/2017 1532   LABSPEC 1.010 02/19/2017 1532   LABSPEC 1.010 02/19/2017 1022   PHURINE 6.0 02/19/2017 1532   GLUCOSEU 50 (A) 02/19/2017 1532   GLUCOSEU Negative 02/19/2017 1022   HGBUR MODERATE (A) 02/19/2017 1532   BILIRUBINUR NEGATIVE 02/19/2017 1532   BILIRUBINUR Negative 02/19/2017 1022   KETONESUR NEGATIVE 02/19/2017 1532   PROTEINUR 100 (A) 02/19/2017 1532   UROBILINOGEN 0.2 02/19/2017 1022   NITRITE NEGATIVE 02/19/2017 1532   LEUKOCYTESUR LARGE (A) 02/19/2017 1532   LEUKOCYTESUR Large 02/19/2017 1022     TSH 0.356  Imaging/Diagnostic Tests: No results found.  Bufford Lope, DO 02/20/2017, 7:07 AM PGY-1, [place] Intern pager: [phone], text pages welcome

## 2017-02-20 NOTE — Progress Notes (Signed)
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 5 Met acidosis, improved 6 Anemia, dilutional and chemo related  Rec: Reduce IVFs   Subjective: Interval History: Much better, no dizziness  Objective: Vital signs in last 24 hours: Temp:  [97.5 F (36.4 C)-98.1 F (36.7 C)] 98.1 F (36.7 C) (06/27 0715) Pulse Rate:  [67-100] 97 (06/27 0715) Resp:  [12-34] 26 (06/27 0715) BP: (70-105)/(39-66) 94/55 (06/27 0715) SpO2:  [81 %-100 %] 97 % (06/27 0715) Weight:  [68.5 kg (151 lb)-69 kg (152 lb 1.9 oz)] 69 kg (152 lb 1.9 oz) (06/26 1822) Weight change:   Intake/Output from previous day: 06/26 0701 - 06/27 0700 In: 6219.6 [P.O.:120; I.V.:939.6; IV Piggyback:5160] Out: 950 [Urine:850; Emesis/NG output:100] Intake/Output this shift: Total I/O In: 543.8 [I.V.:543.8] Out: 50 [Urine:50]  General appearance: alert and cooperative Resp: clear to auscultation bilaterally Chest wall: no tenderness Cardio: regular rate and rhythm, S1, S2 normal, no murmur, click, rub or gallop  LE edema 1+  Lab Results:  Recent Labs  02/19/17 1257 02/20/17 0446  WBC 11.2* 4.5  HGB 10.3* 7.2*  HCT 32.2* 23.2*  PLT 311 170   BMET:  Recent Labs  02/20/17 0043 02/20/17 0446  NA 136 136  K 5.0 4.8  CL 107 106  CO2 21* 22  GLUCOSE 131* 109*  BUN 56* 52*  CREATININE 3.47* 3.25*  CALCIUM 7.9* 7.5*   No results for input(s): PTH in the last 72 hours. Iron Studies: No results for input(s): IRON, TIBC, TRANSFERRIN, FERRITIN in the last 72 hours. Studies/Results: US Abdomen Complete  Result Date: 02/20/2017 CLINICAL DATA:  Renal failure.  History of lymphoma. EXAM: ABDOMEN ULTRASOUND COMPLETE COMPARISON:  12/21/2016 renal ultrasound.  09/14/2016 PET-CT. FINDINGS: Gallbladder: Small gallstones  measure up to 1.5 cm in size. No gallbladder wall thickening. No sonographic Murphy sign noted by sonographer. Common bile duct: Diameter: 6 mm Liver: No focal lesion identified. Within normal limits in parenchymal echogenicity. IVC: No abnormality visualized. Pancreas: Visualized portion unremarkable. Spleen: Size and appearance within normal limits. Right Kidney: Length: 10.2 cm. Echogenicity within normal limits. No mass or hydronephrosis visualized. Left Kidney: Length: 10.5 cm. Echogenicity within normal limits. No mass. Slight calyceal fullness in the lower pole without pelviectasis. Abdominal aorta: No aneurysm visualized. Other findings: None. IMPRESSION: 1. Slight left lower pole renal caliectasis without significant hydronephrosis. 2. Unremarkable appearance of the right kidney. 3. Cholelithiasis without evidence of cholecystitis. Electronically Signed   By: Logan Bores M.D.   On: 02/20/2017 09:55    Scheduled: . Chlorhexidine Gluconate Cloth  6 each Topical Daily  . heparin  5,000 Units Subcutaneous Q8H  . levothyroxine  112 mcg Oral QAC breakfast  . sodium chloride flush  10-40 mL Intracatheter Q12H  . sodium chloride flush  3 mL Intravenous Q12H    LOS: 1 day   Nitika Jackowski C 02/20/2017,10:55 AM

## 2017-02-20 NOTE — Progress Notes (Signed)
Patient's most recent BP 85/48. Recommended another 1L fluid bolus. Asked patient's nurse to obtain manual BP measurement after bolus. She required in and out cath for 400 cc urine due to discomfort. May need repeat I&O with continued fluid administration.

## 2017-02-21 ENCOUNTER — Other Ambulatory Visit: Payer: Self-pay | Admitting: *Deleted

## 2017-02-21 DIAGNOSIS — E43 Unspecified severe protein-calorie malnutrition: Secondary | ICD-10-CM | POA: Insufficient documentation

## 2017-02-21 DIAGNOSIS — D649 Anemia, unspecified: Secondary | ICD-10-CM

## 2017-02-21 DIAGNOSIS — C8335 Diffuse large B-cell lymphoma, lymph nodes of inguinal region and lower limb: Secondary | ICD-10-CM

## 2017-02-21 DIAGNOSIS — D6481 Anemia due to antineoplastic chemotherapy: Secondary | ICD-10-CM

## 2017-02-21 LAB — CBC
HEMATOCRIT: 22.8 % — AB (ref 36.0–46.0)
HEMOGLOBIN: 7.3 g/dL — AB (ref 12.0–15.0)
MCH: 27.8 pg (ref 26.0–34.0)
MCHC: 32 g/dL (ref 30.0–36.0)
MCV: 86.7 fL (ref 78.0–100.0)
Platelets: 171 10*3/uL (ref 150–400)
RBC: 2.63 MIL/uL — ABNORMAL LOW (ref 3.87–5.11)
RDW: 18.2 % — ABNORMAL HIGH (ref 11.5–15.5)
WBC: 4.9 10*3/uL (ref 4.0–10.5)

## 2017-02-21 LAB — RENAL FUNCTION PANEL
ALBUMIN: 1.9 g/dL — AB (ref 3.5–5.0)
ANION GAP: 11 (ref 5–15)
BUN: 34 mg/dL — ABNORMAL HIGH (ref 6–20)
CHLORIDE: 97 mmol/L — AB (ref 101–111)
CO2: 29 mmol/L (ref 22–32)
Calcium: 7.4 mg/dL — ABNORMAL LOW (ref 8.9–10.3)
Creatinine, Ser: 2.75 mg/dL — ABNORMAL HIGH (ref 0.44–1.00)
GFR calc Af Amer: 18 mL/min — ABNORMAL LOW (ref >60)
GFR calc non Af Amer: 16 mL/min — ABNORMAL LOW (ref >60)
Glucose, Bld: 90 mg/dL (ref 65–99)
PHOSPHORUS: 3.4 mg/dL (ref 2.5–4.6)
POTASSIUM: 3.8 mmol/L (ref 3.5–5.1)
Sodium: 137 mmol/L (ref 135–145)

## 2017-02-21 MED ORDER — METOPROLOL TARTRATE 25 MG PO TABS: 12.5000 mg | ORAL_TABLET | Freq: Two times a day (BID) | ORAL | 0 refills | Status: AC

## 2017-02-21 MED ORDER — HEPARIN SOD (PORK) LOCK FLUSH 100 UNIT/ML IV SOLN
500.0000 [IU] | INTRAVENOUS | Status: AC | PRN
  Administered 2017-02-21: 500 [IU]

## 2017-02-21 MED ORDER — ENSURE ENLIVE PO LIQD: 237.0000 mL | Freq: Three times a day (TID) | ORAL | Status: DC

## 2017-02-21 NOTE — Progress Notes (Signed)
IP PROGRESS NOTE  Subjective:   Kelly Ray is well-known to me with a history of non-Hodgkin's lymphoma. She has recurrent urinary tract infections. She was diagnosed with a resistant Escherichia coli urinary tract infection on 02/13/2017. She was started on Bactrim. She called with nausea/vomiting persistent cloudy urine on 02/18/2017. On 02/19/2017 the creatinine and potassium returned elevated and she was referred to emergency room for further evaluation.  She was diagnosed with acute renal failure. She was seen in consultation by Dr. Florene Glen and the renal failure is felt to be related to hypokalemia. A renal ultrasound not reveal significant hydronephrosis.  Kelly Ray reports feeling well. She wants to go home. Her husband reports her mental status has cleared over the past 4-5 days. She has experienced multiple falls at home over the past few months, none recently. No nodularity over the legs.  Objective: Vital signs in last 24 hours: Blood pressure 112/76, pulse 85, temperature 98.3 F (36.8 C), temperature source Oral, resp. rate 14, height _0  (1.575 m), weight 152 lb 1.9 oz (69 kg), SpO2 100 %.  Intake/Output from previous day: 06/27 0701 - 06/28 0700 In: 3252.9 [P.O.:360; I.V.:2792.9; IV Piggyback:100] Out: 1120 [Urine:1120]  Physical Exam:  HEENT: No thrush Abdomen: No hepatosplenomegaly, nontender Extremities: No leg edema Lymph nodes: No cervical, supraclavicular, axillary, or inguinal nodes Skin: Multiple scars at the sites of lymphoma at the lower legs, no nodularity  Portacath/PICC-without erythema  Lab Results:  Recent Labs  02/20/17 0446 02/21/17 0321  WBC 4.5 4.9  HGB 7.2* 7.3*  HCT 23.2* 22.8*  PLT 170 171    BMET  Recent Labs  02/20/17 0446 02/21/17 0321  NA 136 137  K 4.8 3.8  CL 106 97*  CO2 22 29  GLUCOSE 109* 90  BUN 52* 34*  CREATININE 3.25* 2.75*  CALCIUM 7.5* 7.4*    Studies/Results: US Abdomen Complete  Result Date:  02/20/2017 CLINICAL DATA:  Renal failure.  History of lymphoma. EXAM: ABDOMEN ULTRASOUND COMPLETE COMPARISON:  12/21/2016 renal ultrasound.  09/14/2016 PET-CT. FINDINGS: Gallbladder: Small gallstones measure up to 1.5 cm in size. No gallbladder wall thickening. No sonographic Murphy sign noted by sonographer. Common bile duct: Diameter: 6 mm Liver: No focal lesion identified. Within normal limits in parenchymal echogenicity. IVC: No abnormality visualized. Pancreas: Visualized portion unremarkable. Spleen: Size and appearance within normal limits. Right Kidney: Length: 10.2 cm. Echogenicity within normal limits. No mass or hydronephrosis visualized. Left Kidney: Length: 10.5 cm. Echogenicity within normal limits. No mass. Slight calyceal fullness in the lower pole without pelviectasis. Abdominal aorta: No aneurysm visualized. Other findings: None. IMPRESSION: 1. Slight left lower pole renal caliectasis without significant hydronephrosis. 2. Unremarkable appearance of the right kidney. 3. Cholelithiasis without evidence of cholecystitis. Electronically Signed   By: Logan Bores M.D.   On: 02/20/2017 09:55    Medications: I have reviewed the patient's current medications.  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 Negative restaging CT evaluation 10/24/2012  Nodular skin lesions at the right lower leg, status post a shave biopsy 07/15/2013 confirming a malignant B-cell lymphoma, diffuse large cell positive for CD20, BCL 6, BCL 2, and CD10  Staging bone marrow biopsy 07/31/2013, negative for involvement with lymphoma  Staging PET scan 07/30/2013-negative.   Status post palliative radiation right lower leg nodular skin lesions 08/17/2013 through 09/03/2013.New nodular skin lesions at the right lower leg April 2017, status postpsies of lesions at  the right lower leg 12/07/2015  confirming large B-cell lymphoma, CD20 positive bio  staging PET scan 12/26/2015-hypermetabolic cutaneous and subcutaneous nodules in the right lower extremity, no other evidence of lymphoma  Cycle 1 bendamustine/Rituxan 01/03/2016  Cycle 2 bendamustine/Rituxan 01/31/2016  Clinical progression with enlargement of multiple right lower leg skin lesions 02/23/2016  Status post radiation right lower leg 03/14/2016 through 03/29/2016  Clinical progression with new and enlarging skin lesions right lower leg 07/06/2016  PET scan 07/12/2016-clear interval increase in size and metabolic activity of a large nodule posterior right calf and a smaller subcutaneous nodule medial right thigh; several more superficial right lower extremity lesions improved with reduction in size and metabolic activity; single focus of metabolic activity associated with the medial right clavicle with bonychange  Cycle 1 CHOP/Rituxan 07/17/2016  Cycle 2 CHOP/Rituxan 08/07/2016-Neulasta added  Cycle 3 CHOP/Rituxan 08/28/2016 with Neulasta support  Restaging PET scan 09/14/2016-hypermetabolic subcutaneous nodules previously demonstrated within the right thigh and right calf have nearly resolved. There is some residual activity within the calf which could be postsurgical. Small focus of activity within the left latissimus dorsi muscle within the posterior chest wall without corresponding CT abnormality.  Cycle 4 CHOP/Rituxan 09/18/2016 (Adriamycin deleted, etoposide substituted daily 3)  Cycle 5 CHOP/rituximab with etoposide 10/08/2016  Cycle 6 CHOP/rituximab with etoposide 10/29/2016 2. Port-A-Cath placement 06/01/2011. Port-A-Cath removal 11/04/2012 3. Hypertension. 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 5. Hypercholesterolemia. 6. Osteoarthritis. 7. Malaise-? Secondary to progression of non-Hodgkin's lymphoma. Improved. 8. Staph infection right lower leg February 2015. 9. Right calf lesion with purulent drainage  02/09/2016, culture obtained-group B strep, doxycycline prescribed 10. Port-A-Cath placement 07/13/2016 11. 2-D echo 07/10/2016-estimated ejection fraction range of 55-60% 12. 07/17/2016 hepatitis Bsurface antigen negative, core antibody positive 13. Anemia/thrombocytopenia-persistent following final chemotherapy 10/29/2016, the thrombocytopenia has resolved, anemia persists 14. Paresthesias, leg weakness, falls-negative brain CT 12/13/2016  Noncontrast brain MRI and MRIs cervical, thoracic, and lumbar spine negative on 12/19/2016  Status post lumbar puncture 12/20/2016; CSF negative for malignant cells 15. 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 Admission 02/19/2017 with acute renal failure-likely related to dehydration, improved   She was admitted with acute renal failure 02/19/2017 in the setting of a recurrent Escherichia coli urinary tract infection. The renal failure was most likely related to volume depletion/sepsis syndrome. Her clinical status and the renal failure have improved.  There has been no evidence of progressive lymphoma.  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  Recommendations: 1. Management of urinary tract infection per the medical service, consider urology evaluation for recurrent urinary tract infections 2. Transfuse for symptomatic anemia, we will follow the hemoglobin as an outpatient 3. She is scheduled for outpatient follow-up at the Cancer center 02/22/2017, we will reschedule this appointment 4. Please call Oncology as needed    LOS: 2 days   Donneta Romberg, MD   02/21/2017, 1:57 PM

## 2017-02-21 NOTE — Discharge Instructions (Signed)
It has been a pleasure taking care of you! You were admitted due to acute kidney injury in the setting of UTI. We have treated you with IV fluids and antibiotics. Your kidney function improved, your primary care doctor will likely recheck your kidney function in about 6 weeks. Since you had received antibiotics before admission and during your hospital stay, we believe you received more than adequate treatment for UTI. In addition your repeat urine culture is currently not growing any organisms which is good.  There could be some changes made to your home medications during this hospitalization. Please, make sure to read the directions before you take them. The names and directions on how to take these medications are found on this discharge paper under medication section.  Please follow up at your primary care doctor's office, you should call and make a hospital follow up appointment.

## 2017-02-21 NOTE — Progress Notes (Signed)
Pt ready for discharge home with husband. Reviewed all d/c instructions and prescriptions. Pt will have [place] pt.

## 2017-02-21 NOTE — Progress Notes (Signed)
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  I will sign off.  Subjective: Interval History: Feels better.  Objective: Vital signs in last 24 hours: Temp:  [98 F (36.7 C)-98.6 F (37 C)] 98.1 F (36.7 C) (06/28 0731) Pulse Rate:  [72-84] 73 (06/28 0731) Resp:  [17-30] 19 (06/28 0731) BP: (79-116)/(49-70) 113/70 (06/28 0731) SpO2:  [95 %-98 %] 98 % (06/28 0731) Weight change:   Intake/Output from previous day: 06/27 0701 - 06/28 0700 In: 3252.9 [P.O.:360; I.V.:2792.9; IV Piggyback:100] Out: 1120 [Urine:1120] Intake/Output this shift: Total I/O In: 390 [P.O.:240; I.V.:150] Out: -   General appearance: alert and cooperative GI: soft, non-tender; bowel sounds normal; no masses,  no organomegaly Extremities: edema 1+  Lab Results:  Recent Labs  02/20/17 0446 02/21/17 0321  WBC 4.5 4.9  HGB 7.2* 7.3*  HCT 23.2* 22.8*  PLT 170 171   BMET:  Recent Labs  02/20/17 0446 02/21/17 0321  NA 136 137  K 4.8 3.8  CL 106 97*  CO2 22 29  GLUCOSE 109* 90  BUN 52* 34*  CREATININE 3.25* 2.75*  CALCIUM 7.5* 7.4*   No results for input(s): PTH in the last 72 hours. Iron Studies: No results for input(s): IRON, TIBC, TRANSFERRIN, FERRITIN in the last 72 hours. Studies/Results: US Abdomen Complete  Result Date: 02/20/2017 CLINICAL DATA:  Renal failure.  History of lymphoma. EXAM: ABDOMEN ULTRASOUND COMPLETE COMPARISON:  12/21/2016 renal ultrasound.  09/14/2016 PET-CT. FINDINGS: Gallbladder: Small gallstones measure up to 1.5 cm in size. No gallbladder wall thickening. No sonographic Murphy sign noted by  sonographer. Common bile duct: Diameter: 6 mm Liver: No focal lesion identified. Within normal limits in parenchymal echogenicity. IVC: No abnormality visualized. Pancreas: Visualized portion unremarkable. Spleen: Size and appearance within normal limits. Right Kidney: Length: 10.2 cm. Echogenicity within normal limits. No mass or hydronephrosis visualized. Left Kidney: Length: 10.5 cm. Echogenicity within normal limits. No mass. Slight calyceal fullness in the lower pole without pelviectasis. Abdominal aorta: No aneurysm visualized. Other findings: None. IMPRESSION: 1. Slight left lower pole renal caliectasis without significant hydronephrosis. 2. Unremarkable appearance of the right kidney. 3. Cholelithiasis without evidence of cholecystitis. Electronically Signed   By: Logan Bores M.D.   On: 02/20/2017 09:55    Scheduled: . Chlorhexidine Gluconate Cloth  6 each Topical Daily  . feeding supplement (ENSURE ENLIVE)  237 mL Oral BID BM  . heparin  5,000 Units Subcutaneous Q8H  . levothyroxine  112 mcg Oral QAC breakfast  . sodium chloride flush  10-40 mL Intracatheter Q12H  . sodium chloride flush  3 mL Intravenous Q12H     LOS: 2 days   Rahmon Heigl C 02/21/2017,9:42 AM

## 2017-02-21 NOTE — Evaluation (Signed)
Physical Therapy Evaluation Patient Details Name: Kelly Ray MRN: [id number] DOB: [birth year] Today's Date: 02/21/2017   History of Present Illness  Patient is a 76 y/o female who presents with weakness. Found to have hyperkalemia of 7.4 in the setting of acute renal failure and hypotension 70s/50s. EKG-peaked T-waves.  PMH includes large B cell lymphoma, HTN, thyroid disease.  Clinical Impression  Patient presents with generalized weakness, deconditioning, decreased activity tolerance, imbalance and decreased mobility s/p above. Tolerated gait training with Min A for balance/safety. Pt has 24/7 supervision at home and all necessary DME. Educated spouse about handling techniques to help wife and discussed fall reduction techniques as well. Pt recently finished HHPT a few weeks ago and was driving short distances and walking Mod I with RW but now requires assist for all mobility. Will follow acutely to maximize independence and mobility prior to return home with follow up HHPT. CM and RN notified.     Follow Up Recommendations Home health PT;Supervision for mobility/OOB;Supervision/Assistance - 24 hour    Equipment Recommendations  None recommended by PT    Recommendations for Other Services OT consult     Precautions / Restrictions Precautions Precautions: Fall Restrictions Weight Bearing Restrictions: No      Mobility  Bed Mobility Overal bed mobility: Needs Assistance Bed Mobility: Supine to Sit;Sit to Supine     Supine to sit: Supervision Sit to supine: Supervision   General bed mobility comments: No assist to get to EOB, no dizziness.   Transfers Overall transfer level: Needs assistance Equipment used: Rolling walker (2 wheeled) Transfers: Sit to/from Stand Sit to Stand: Min guard         General transfer comment: Min guard for safety. Stood from [employer]. No dizziness. BP soft. See assessment for details.  Ambulation/Gait Ambulation/Gait assistance: Min  assist Ambulation Distance (Feet): 100 Feet Assistive device: Rolling walker (2 wheeled) Gait Pattern/deviations: Step-through pattern;Decreased stride length;Drifts right/left;Trunk flexed;Narrow base of support Gait velocity: decreased Gait velocity interpretation: Below normal speed for age/gender General Gait Details: Slow, mildly unsteady gait with RW for support. Fatigues quickly and easily. No knee buckling. HR ranged from 90-130s bpm.   Stairs            Wheelchair Mobility    Modified Rankin (Stroke Patients Only)       Balance Overall balance assessment: Needs assistance Sitting-balance support: Feet supported;No upper extremity supported Sitting balance-Leahy Scale: Good     Standing balance support: During functional activity;Bilateral upper extremity supported Standing balance-Leahy Scale: Poor Standing balance comment: Reliant on BUEs for support in standing.                             Pertinent Vitals/Pain Pain Assessment: No/denies pain    Home Living Family/patient expects to be discharged to:: Private residence Living Arrangements: Spouse/significant other Available Help at Discharge: Family;Available 24 hours/day Type of Home: House Home Access: Ramped entrance     Home Layout: One level Home Equipment: Walker - 2 wheels;Shower seat - built in;Grab bars - toilet;Grab bars - tub/shower;Bedside commode;Shower seat;Wheelchair - manual      Prior Function Level of Independence: Independent with assistive device(s)         Comments: Has been using RW recently for mobility; independent with ADL. Just finished HHPT a few weeks ago and even was driving then.     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 A&Ox3.      General Comments General comments (skin integrity, edema, etc.): Spouse present during session. HR 90-130s bpm. Supine BP pre activity 106/59, post activity 117/90.    Exercises     Assessment/Plan    PT Assessment Patient needs continued PT services  PT Problem List Decreased strength;Decreased mobility;Cardiopulmonary status limiting activity;Impaired sensation;Decreased balance;Decreased activity tolerance;Decreased cognition       PT Treatment Interventions Therapeutic activities;Gait training;Patient/family education;Therapeutic exercise;Balance training;Functional mobility training    PT Goals (Current goals can be found in the Care Plan section)  Acute Rehab PT Goals Patient Stated Goal: to go home today PT Goal Formulation: With patient Time For Goal Achievement: 03/07/17 Potential to Achieve Goals: Fair    Frequency Min 3X/week   Barriers to discharge        Co-evaluation               AM-PAC PT "6 Clicks" Daily Activity  Outcome Measure Difficulty turning over in bed (including adjusting bedclothes, sheets and blankets)?: None Difficulty moving from lying on back to sitting on the side of the bed? : None Difficulty sitting down on and standing up from a chair with arms (e.g., wheelchair, bedside commode, etc,.)?: None Help needed moving to and from a bed to chair (including a wheelchair)?: A Little Help needed walking in hospital room?: A Little Help needed climbing  3-5 steps with a railing? : A Lot 6 Click Score: 20    End of Session Equipment Utilized During Treatment: Gait belt Activity Tolerance: Patient limited by fatigue Patient left: in bed;with family/visitor present;with call bell/phone within reach;with bed alarm set Nurse Communication: Mobility status PT Visit Diagnosis: Unsteadiness on feet (R26.81);Muscle weakness (generalized) (M62.81);Other abnormalities of gait and mobility (R26.89)    Time: 1315-1340 PT Time Calculation (min) (ACUTE ONLY): 25 min   Charges:   PT Evaluation $PT Eval Moderate Complexity: 1 Procedure PT Treatments $Gait Training: 8-22 mins   PT G Codes:        Wray Kearns, PT, DPT [phone]    Marguarite Arbour A [place] Selke 02/21/2017, 1:56 PM

## 2017-02-21 NOTE — Care Management Note (Signed)
Case Management Note  Patient Details  Name: Kelly Ray MRN: [id number] Date of Birth: [birth year]  Subjective/Objective:   76 yr old female admitted with hyperkalemia.                 Action/Plan:  Case manager spoke with patient and her husband concernign discharge plan. Choice was offered for home health agency. Patient says she has used [place] ([facility]) in the past, and wants to do so now.  She has RW, 3in1 and wheelchair at home. CM called referral to [facility]a [facility], [facility]. Due to staffing start of care will be Monday, February 25, 2017. Patient will have support from her husband at discharge. No further needs identified.   Expected Discharge Date:  02/21/17               Expected Discharge Plan:  [place]  In-House Referral:  NA  Discharge planning Services  CM Consult  Post Acute Care Choice:  Home Health Choice offered to:  Patient, Spouse  DME Arranged:  N/A (patient has RW, 3in1, wheelchair at home) DME Agency:  NA  HH Arranged:  PT [place] Agency:  [facility] (formerly [employer])  Status of Service:  Completed, signed off  If discussed at [employer], dates discussed:    Additional Comments:  Ninfa Meeker, RN 02/21/2017, 2:55 PM

## 2017-02-21 NOTE — Progress Notes (Signed)
Family Medicine Teaching Service Daily Progress Note Intern Pager: [phone]  Patient name: Kelly Ray Medical record number: [id number] Date of birth: 02-12-1941 Age: 76 y.o. Gender: female  Primary Care Provider: Forrest Moron, MD Consultants: Nephrology Code Status: DNR   Pt Overview and Major Events to Date:  6/26 Admitted for weakness and hyperkalemia  Assessment and Plan: [name] is a 76 y.o. female presenting with increased weakness and found to have hyperkalemia to 7.4 and hypotension to 70s/50s. PMH is significant for B-cell lymphoma, HTN, hyperthyroidism, OA s/p L knee replacement, and panic attacks.   Hypotension with concern for developing sepsis, resolved. BP 113/70 this am, MAP 72. Normal mentation. Afebrile. Leukocytosis resolved. Likely source UTI with h/o MDR E coli from 02/13/17 and ESBL E coli in 12/20/16. - DC IVF - s/p 6 days bactrim on admit, IV zosyn to cover for possible UTI (6/26-6/28). Monitor off ABX. - Follow-up blood cultures NGTD and urine culture reincubated for better growth.  Acute kidney failure, improving: SCr 2.75, peak 4.31, baseline 1.0. Likely 2/2 nephrotoxic medications (NSAIDs, bactrim) and hypoperfusion with low BP. Increased risk given recent AKI in April from UTI (SCr to 2.29 12/21/16). Renal US 12/21/16 in setting of AKI was negative for hydronephrosis with normal sized kidneys.  - Nephrology signed off, okay to DC. If recheck in 6 weeks abnl will see as outpt - DC IVF - Avoid nephrotoxic agents - Abdominal US neg for obstructive uropathy   Anemia. Hgb stable 7.3, from 10.3 on admit. Likely 2/2 dilutional given all cell lines decreased with aggressive IV hydration.  Asymptomatic. - monitor CBC  Hyperkalemia, resolved: K 3.8 stable. in the setting of acute renal failure, decreased PO intake, and patient taking potassium supplementation.  - Monitor K - Nephrology consulted, appreciate recommendations - Hold home  KCl  Hyponatremia, resolved: Na 137. In setting of acute kidney injury with likely ATN with hypovolemia and recent NSAID use and bactrim. Improved with IVF - Monitor   Diffuse Large B Cell Lymphoma: Diagnosed in 2012. Chemotherapy with cycles of CHOP/rituxan and bendamustine/rituxan. S/p 6 cycles of CHOP/rituxan in March. She has had multiple cutaneous recurrences. 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 Her Oncologist is Dr. Benay Spice.  - PT eval   HTN: Takes metoprolol 12.5 mg BID. Previously also on hyzaar, which was discontinued at discharge last hospitalization due to AKI.  - Holding while hypotensive.  - monitor BP  Hypothyroidism: TSH 0.356 WNL - Continue home synthroid 112 mcg qAC  HLD: Takes pravastatin 40 mg qhs.  - Hold until SCr improves.   Panic attacks: Previously had tried xanax but did not feel it helped. Not currently on medication for this. Last episode about 3 days ago.   FEN/GI: regular diet Prophylaxis: heparin  Disposition: pending PT/OT recommendations, anticipate home later today.  Subjective:  Feels well this morning and much improved, back to normal and wanting to go home. No urinary symptoms. No abd pain.   Objective: Temp:  [98 F (36.7 C)-98.6 F (37 C)] 98.4 F (36.9 C) (06/28 0327) Pulse Rate:  [72-97] 84 (06/28 0327) Resp:  [17-30] 21 (06/28 0327) BP: (79-116)/(49-58) 107/56 (06/28 0300) SpO2:  [95 %-98 %] 98 % (06/28 0327) Physical Exam: General: laying in bed comfortably, in NAd Cardiovascular: RRR, no murmurs Respiratory: CTAB, normal  effort on room air Abdomen: soft, nontender, nondistended, + bowel sounds Extremities: no LE edema  Laboratory:  Recent Labs Lab 02/19/17 1257 02/20/17 0446  02/21/17 0321  WBC 11.2* 4.5 4.9  HGB 10.3* 7.2* 7.3*  HCT 32.2* 23.2* 22.8*  PLT 311 170 171    Recent Labs Lab 02/19/17 1257 02/19/17 1942 02/20/17 0043 02/20/17 0446 02/21/17 0321  NA 129* 133* 136 136 137  K 7.4* 5.0 5.0 4.8 3.8  CL 101 107 107 106 97*  CO2 18* 16* 21* 22 29  BUN 76* 65* 56* 52* 34*  CREATININE 4.31* 3.70* 3.47* 3.25* 2.75*  CALCIUM 9.5 8.1* 7.9* 7.5* 7.4*  PROT 6.6 4.6*  --   --   --   BILITOT 0.6 0.7  --   --   --   ALKPHOS 143* 107  --   --   --   ALT 33 28  --   --   --   AST 30 27  --   --   --   GLUCOSE 103* 145* 131* 109* 90   Urinalysis    Component Value Date/Time   COLORURINE YELLOW 02/19/2017 1532   APPEARANCEUR CLOUDY (A) 02/19/2017 1532   LABSPEC 1.010 02/19/2017 1532   LABSPEC 1.010 02/19/2017 1022   PHURINE 6.0 02/19/2017 1532   GLUCOSEU 50 (A) 02/19/2017 1532   GLUCOSEU Negative 02/19/2017 1022   HGBUR MODERATE (A) 02/19/2017 1532   BILIRUBINUR NEGATIVE 02/19/2017 1532   BILIRUBINUR Negative 02/19/2017 1022   KETONESUR NEGATIVE 02/19/2017 1532   PROTEINUR 100 (A) 02/19/2017 1532   UROBILINOGEN 0.2 02/19/2017 1022   NITRITE NEGATIVE 02/19/2017 1532   LEUKOCYTESUR LARGE (A) 02/19/2017 1532   LEUKOCYTESUR Large 02/19/2017 1022     TSH 0.356  Imaging/Diagnostic Tests: US Abdomen Complete  Result Date: 02/20/2017 CLINICAL DATA:  Renal failure.  History of lymphoma. EXAM: ABDOMEN ULTRASOUND COMPLETE COMPARISON:  12/21/2016 renal ultrasound.  09/14/2016 PET-CT. FINDINGS: Gallbladder: Small gallstones measure up to 1.5 cm in size. No gallbladder wall thickening. No sonographic Murphy sign noted by sonographer. Common bile duct: Diameter: 6 mm Liver: No focal lesion identified. Within normal limits in parenchymal echogenicity. IVC: No abnormality visualized. Pancreas: Visualized portion unremarkable. Spleen: Size and appearance within normal limits. Right Kidney: Length: 10.2 cm. Echogenicity within normal limits. No mass or  hydronephrosis visualized. Left Kidney: Length: 10.5 cm. Echogenicity within normal limits. No mass. Slight calyceal fullness in the lower pole without pelviectasis. Abdominal aorta: No aneurysm visualized. Other findings: None. IMPRESSION: 1. Slight left lower pole renal caliectasis without significant hydronephrosis. 2. Unremarkable appearance of the right kidney. 3. Cholelithiasis without evidence of cholecystitis. Electronically Signed   By: Logan Bores M.D.   On: 02/20/2017 09:55    Bufford Lope, DO 02/21/2017, 6:56 AM PGY-1, [place] Intern pager: [phone], text pages welcome

## 2017-02-21 NOTE — Progress Notes (Addendum)
Initial Nutrition Assessment  DOCUMENTATION CODES:   Severe malnutrition in context of chronic illness  INTERVENTION:    Continue Ensure Enlive po TID, each supplement provides 350 kcal and 20 grams of protein  NUTRITION DIAGNOSIS:   Malnutrition (moderate) related to catabolic illness (large B-cell lymphoma ) as evidenced by moderate to severe muscle loss, moderate to severe fat loss and 18% weight loss x 6 months   GOAL:   Patient will meet greater than or equal to 90% of their needs  MONITOR:   PO intake, Supplement acceptance, Labs, Weight trends, Skin, I & O's  REASON FOR ASSESSMENT:   Malnutrition Screening Tool  ASSESSMENT:   "76 y.o. Female admitted with increased weakness and found to have hyperkalemia to 7.4 and hypotension to 70s/50s. PMH is significant for B-cell lymphoma, HTN, hyperthyroidism, OA s/p L knee replacement, and panic attacks" Copied from [facility] note, Bufford Lope, DO, 02/20/17.  RD spoke with pt at bedside; husband present. Pt reports she tries to consume 3 meals per day; typically eats 2. Does drink Ensure and/or Boost nutrition supplements at home.  Pt also endorses a 50 lb weight loss since starting chemotherapy. Per wt readings below, pt has had a 18% weight loss since 08/2016. Severe for time frame. Labs and medications reviewed. CBG 97.  Nutrition-Focused physical exam completed. Findings are moderate to severe fat depletion, moderate to severe muscle depletion, and no edema.   Of note: This RD spoke with pt's husband during previous hospitalization in April 2018. At that time he reported pt was not eating and was continuing to lose weight.  Diet Order:  Diet Heart Room service appropriate? Yes; Fluid consistency: Thin  Skin:  Stage II to coccyx   Last BM:  6/27  Height:   Ht Readings from Last 1 Encounters:  02/19/17 5\' 2"  (1.575 m)   Weight:   Wt Readings from Last 1 Encounters:  02/19/17 152 lb 1.9 oz (69 kg)   Wt  Readings from Last 15 Encounters:  02/19/17 152 lb 1.9 oz (69 kg)  02/01/17 151 lb 14.4 oz (68.9 kg)  01/30/17 153 lb (69.4 kg)  01/16/17 163 lb 4.8 oz (74.1 kg)  12/28/16 170 lb 3.2 oz (77.2 kg)  12/26/16 171 lb 8 oz (77.8 kg)  12/19/16 167 lb 11.2 oz (76.1 kg)  12/12/16 171 lb 9.6 oz (77.8 kg)  11/26/16 177 lb 12.8 oz (80.6 kg)  11/09/16 175 lb 4.8 oz (79.5 kg)  11/07/16 176 lb (79.8 kg)  10/29/16 182 lb (82.6 kg)  10/08/16 182 lb (82.6 kg)  09/18/16 184 lb (83.5 kg)  08/28/16 185 lb 3.2 oz (84 kg)   Ideal Body Weight:  50 kg  BMI:  Body mass index is 27.82 kg/m.  Estimated Nutritional Needs:   Kcal:  1700-1900  Protein:  80-90 gm  Fluid:  1.7-1.9 L  EDUCATION NEEDS:   No education needs identified at this time  Arthur Holms, RD, LDN Pager #: [phone] After-Hours Pager #: [phone]

## 2017-02-22 ENCOUNTER — Other Ambulatory Visit: Payer: Medicare Other

## 2017-02-22 ENCOUNTER — Other Ambulatory Visit: Payer: Self-pay | Admitting: Nurse Practitioner

## 2017-02-22 ENCOUNTER — Ambulatory Visit: Payer: Medicare Other | Admitting: Nurse Practitioner

## 2017-02-22 ENCOUNTER — Telehealth: Payer: Self-pay

## 2017-02-22 DIAGNOSIS — C8335 Diffuse large B-cell lymphoma, lymph nodes of inguinal region and lower limb: Secondary | ICD-10-CM

## 2017-02-22 LAB — URINE CULTURE: Culture: <10000 — AB

## 2017-02-22 NOTE — Telephone Encounter (Signed)
Pt called she was released from [facility] yesterday after 3 pm. She was dx hyperkalemia and hypotension and acute renal failure.  She is dizzy and has a headache. Dizzy and HA started when she got home. She had to sit down to get her balance back. She took tylenol and the headache improved and the dizzyness improved, neither is totally gone. It feels better after rest but does come back. She has drunk 3-4 glasses water this morning. She is urinating very well. She is moving bowels well.   Is it ok to take tylenol? Is there anything else she needs to do?

## 2017-02-22 NOTE — Telephone Encounter (Signed)
Returned call to pt: OK to take Tylenol, per Dr. Benay Spice. Instructed pt to change positions slowly and push PO fluids. She voiced understanding. Pt reports PT is scheduled to start 7/2. She will come in for lab on 7/3. Instructed her to stop at scheduling after lab appt to set up next office visit.

## 2017-02-24 LAB — CULTURE, BLOOD (ROUTINE X 2)
CULTURE: NO GROWTH
Culture: NO GROWTH
SPECIAL REQUESTS: ADEQUATE
SPECIAL REQUESTS: ADEQUATE

## 2017-02-25 ENCOUNTER — Telehealth: Payer: Self-pay

## 2017-02-25 ENCOUNTER — Other Ambulatory Visit: Payer: Self-pay

## 2017-02-25 MED ORDER — NITROFURANTOIN MONOHYD MACRO 100 MG PO CAPS: 100.0000 mg | ORAL_CAPSULE | Freq: Two times a day (BID) | ORAL | 0 refills | Status: AC

## 2017-02-25 NOTE — Telephone Encounter (Signed)
Called and informed pt that a prescription has been called in to her pharmacy for her UTI. Informed pt that she is scheduled to see Ned Card, NP on 03/08/17. Pt appreciative of call back. And states that she will pick up that prescription

## 2017-02-25 NOTE — Telephone Encounter (Signed)
Pt's daughter was up for the weekend. And she has a few questions to ask Dr Benay Spice.  The pt and her husband want to talk to Dr Benay Spice. The questions are about the chemo and the UTIs she has. Daughter says she needs to take an antibiotic continuously, prophylacticaly. And she needs to get her medications straight. She does not want to end up back in hospital again.   Discussed she will see Lattie Haw on the 13th and she replied she wants to talk with Dr Benay Spice.  She has lab/flush 7/3 and lab/Lisa 7/13

## 2017-02-26 ENCOUNTER — Telehealth: Payer: Self-pay | Admitting: Family Medicine

## 2017-02-26 ENCOUNTER — Other Ambulatory Visit: Payer: Self-pay | Admitting: Oncology

## 2017-02-26 ENCOUNTER — Other Ambulatory Visit: Payer: Self-pay

## 2017-02-26 ENCOUNTER — Ambulatory Visit (HOSPITAL_BASED_OUTPATIENT_CLINIC_OR_DEPARTMENT_OTHER): Payer: Medicare Other

## 2017-02-26 ENCOUNTER — Other Ambulatory Visit (HOSPITAL_BASED_OUTPATIENT_CLINIC_OR_DEPARTMENT_OTHER): Payer: Medicare Other

## 2017-02-26 DIAGNOSIS — Z452 Encounter for adjustment and management of vascular access device: Secondary | ICD-10-CM | POA: Diagnosis not present

## 2017-02-26 DIAGNOSIS — Z95828 Presence of other vascular implants and grafts: Secondary | ICD-10-CM

## 2017-02-26 DIAGNOSIS — R319 Hematuria, unspecified: Principal | ICD-10-CM

## 2017-02-26 DIAGNOSIS — C8335 Diffuse large B-cell lymphoma, lymph nodes of inguinal region and lower limb: Secondary | ICD-10-CM

## 2017-02-26 DIAGNOSIS — N390 Urinary tract infection, site not specified: Secondary | ICD-10-CM

## 2017-02-26 LAB — COMPREHENSIVE METABOLIC PANEL
ALK PHOS: 112 U/L (ref 40–150)
ALT: 15 U/L (ref 0–55)
AST: 23 U/L (ref 5–34)
Albumin: 2.7 g/dL — ABNORMAL LOW (ref 3.5–5.0)
Anion Gap: 8 mEq/L (ref 3–11)
BILIRUBIN TOTAL: 0.49 mg/dL (ref 0.20–1.20)
BUN: 18.8 mg/dL (ref 7.0–26.0)
CO2: 24 mEq/L (ref 22–29)
Calcium: 8.8 mg/dL (ref 8.4–10.4)
Chloride: 102 mEq/L (ref 98–109)
Creatinine: 1.5 mg/dL — ABNORMAL HIGH (ref 0.6–1.1)
EGFR: 34 mL/min/{1.73_m2} — ABNORMAL LOW (ref >90)
GLUCOSE: 95 mg/dL (ref 70–140)
POTASSIUM: 3.8 meq/L (ref 3.5–5.1)
SODIUM: 134 meq/L — AB (ref 136–145)
TOTAL PROTEIN: 5.6 g/dL — AB (ref 6.4–8.3)

## 2017-02-26 LAB — CBC WITH DIFFERENTIAL/PLATELET
BASO%: 0.7 % (ref 0.0–2.0)
Basophils Absolute: 0 10*3/uL (ref 0.0–0.1)
EOS%: 0.4 % (ref 0.0–7.0)
Eosinophils Absolute: 0 10*3/uL (ref 0.0–0.5)
HCT: 26 % — ABNORMAL LOW (ref 34.8–46.6)
HEMOGLOBIN: 8.5 g/dL — AB (ref 11.6–15.9)
LYMPH%: 16.4 % (ref 14.0–49.7)
MCH: 28.7 pg (ref 25.1–34.0)
MCHC: 32.6 g/dL (ref 31.5–36.0)
MCV: 88.1 fL (ref 79.5–101.0)
MONO#: 0.5 10*3/uL (ref 0.1–0.9)
MONO%: 7.3 % (ref 0.0–14.0)
NEUT%: 75.2 % (ref 38.4–76.8)
NEUTROS ABS: 5.3 10*3/uL (ref 1.5–6.5)
Platelets: 210 10*3/uL (ref 145–400)
RBC: 2.95 10*6/uL — ABNORMAL LOW (ref 3.70–5.45)
RDW: 19.2 % — AB (ref 11.2–14.5)
WBC: 7 10*3/uL (ref 3.9–10.3)
lymph#: 1.2 10*3/uL (ref 0.9–3.3)

## 2017-02-26 LAB — URINALYSIS, MICROSCOPIC - CHCC
BILIRUBIN (URINE): NEGATIVE
Glucose: NEGATIVE mg/dL
KETONES: NEGATIVE mg/dL
Nitrite: NEGATIVE
PROTEIN: 100 mg/dL
SPECIFIC GRAVITY, URINE: 1.01 (ref 1.003–1.035)
Urobilinogen, UR: 0.2 mg/dL (ref 0.2–1)
pH: 6.5 (ref 4.6–8.0)

## 2017-02-26 LAB — TECHNOLOGIST REVIEW

## 2017-02-26 MED ORDER — HEPARIN SOD (PORK) LOCK FLUSH 100 UNIT/ML IV SOLN
500.0000 [IU] | Freq: Once | INTRAVENOUS | Status: AC | PRN
  Administered 2017-02-26: 500 [IU] via INTRAVENOUS
  Filled 2017-02-26: qty 5

## 2017-02-26 MED ORDER — SODIUM CHLORIDE 0.9% FLUSH
10.0000 mL | INTRAVENOUS | Status: DC | PRN
  Administered 2017-02-26: 10 mL via INTRAVENOUS
  Filled 2017-02-26: qty 10

## 2017-02-26 NOTE — Telephone Encounter (Signed)
Per MD Benay Spice, pt was referred to [facility] for frequent UTIs. Pt aware of referral.

## 2017-02-26 NOTE — Progress Notes (Unsigned)
ref

## 2017-02-26 NOTE — Telephone Encounter (Signed)
Ileana Roup from home health is needing to get verbal orders on patient to do 9 weeks 2times a week on gait balance and transfer   Best number for Wilhemena Durie is [phone]

## 2017-02-26 NOTE — Progress Notes (Signed)
Per MD Benay Spice, pt was referred to [facility] for frequent UTIs. Pt aware of referral.

## 2017-02-27 LAB — URINE CULTURE: Organism ID, Bacteria: NO GROWTH

## 2017-02-28 ENCOUNTER — Telehealth: Payer: Self-pay | Admitting: Oncology

## 2017-02-28 NOTE — Telephone Encounter (Signed)
sw pt husband to confirm 8/21 appt at [facility] [place] ph [phone] per sch msg

## 2017-03-01 ENCOUNTER — Telehealth: Payer: Self-pay | Admitting: Family Medicine

## 2017-03-01 NOTE — Telephone Encounter (Signed)
Dr Nolon Rod,  please advise. thanks

## 2017-03-02 NOTE — Telephone Encounter (Signed)
Please give verbal order for home health

## 2017-03-04 ENCOUNTER — Other Ambulatory Visit: Payer: Self-pay | Admitting: Family Medicine

## 2017-03-04 DIAGNOSIS — I10 Essential (primary) hypertension: Secondary | ICD-10-CM

## 2017-03-04 NOTE — Telephone Encounter (Signed)
Left voicemail for home health to call back.

## 2017-03-04 NOTE — Telephone Encounter (Signed)
Spoke with [facility]. Received verbal order.

## 2017-03-08 ENCOUNTER — Telehealth: Payer: Self-pay | Admitting: Family Medicine

## 2017-03-08 ENCOUNTER — Ambulatory Visit (HOSPITAL_BASED_OUTPATIENT_CLINIC_OR_DEPARTMENT_OTHER): Payer: Medicare Other | Admitting: Nurse Practitioner

## 2017-03-08 ENCOUNTER — Other Ambulatory Visit (HOSPITAL_BASED_OUTPATIENT_CLINIC_OR_DEPARTMENT_OTHER): Payer: Medicare Other

## 2017-03-08 ENCOUNTER — Ambulatory Visit: Payer: Medicare Other | Admitting: Family Medicine

## 2017-03-08 VITALS — BP 94/60 | HR 73 | Temp 97.6°F | Wt 143.9 lb

## 2017-03-08 DIAGNOSIS — N390 Urinary tract infection, site not specified: Secondary | ICD-10-CM

## 2017-03-08 DIAGNOSIS — Z87440 Personal history of urinary (tract) infections: Secondary | ICD-10-CM

## 2017-03-08 DIAGNOSIS — R634 Abnormal weight loss: Secondary | ICD-10-CM | POA: Diagnosis not present

## 2017-03-08 DIAGNOSIS — C8335 Diffuse large B-cell lymphoma, lymph nodes of inguinal region and lower limb: Secondary | ICD-10-CM

## 2017-03-08 DIAGNOSIS — N179 Acute kidney failure, unspecified: Secondary | ICD-10-CM | POA: Diagnosis not present

## 2017-03-08 DIAGNOSIS — I959 Hypotension, unspecified: Secondary | ICD-10-CM

## 2017-03-08 LAB — COMPREHENSIVE METABOLIC PANEL
ALT: 9 U/L (ref 0–55)
ANION GAP: 14 meq/L — AB (ref 3–11)
AST: 17 U/L (ref 5–34)
Albumin: 3.1 g/dL — ABNORMAL LOW (ref 3.5–5.0)
Alkaline Phosphatase: 109 U/L (ref 40–150)
BILIRUBIN TOTAL: 0.43 mg/dL (ref 0.20–1.20)
BUN: 27.2 mg/dL — ABNORMAL HIGH (ref 7.0–26.0)
CALCIUM: 9.2 mg/dL (ref 8.4–10.4)
CHLORIDE: 99 meq/L (ref 98–109)
CO2: 22 mEq/L (ref 22–29)
CREATININE: 1.5 mg/dL — AB (ref 0.6–1.1)
EGFR: 34 mL/min/{1.73_m2} — ABNORMAL LOW (ref >90)
Glucose: 136 mg/dl (ref 70–140)
Potassium: 4.1 mEq/L (ref 3.5–5.1)
Sodium: 135 mEq/L — ABNORMAL LOW (ref 136–145)
TOTAL PROTEIN: 6.3 g/dL — AB (ref 6.4–8.3)

## 2017-03-08 LAB — CBC WITH DIFFERENTIAL/PLATELET
BASO%: 0.1 % (ref 0.0–2.0)
Basophils Absolute: 0 10*3/uL (ref 0.0–0.1)
EOS ABS: 0 10*3/uL (ref 0.0–0.5)
EOS%: 0.5 % (ref 0.0–7.0)
HEMATOCRIT: 27.8 % — AB (ref 34.8–46.6)
HEMOGLOBIN: 9 g/dL — AB (ref 11.6–15.9)
LYMPH#: 2 10*3/uL (ref 0.9–3.3)
LYMPH%: 24.3 % (ref 14.0–49.7)
MCH: 28.9 pg (ref 25.1–34.0)
MCHC: 32.4 g/dL (ref 31.5–36.0)
MCV: 89.4 fL (ref 79.5–101.0)
MONO#: 0.5 10*3/uL (ref 0.1–0.9)
MONO%: 5.8 % (ref 0.0–14.0)
NEUT%: 69.3 % (ref 38.4–76.8)
NEUTROS ABS: 5.6 10*3/uL (ref 1.5–6.5)
NRBC: 0 % (ref 0–0)
PLATELETS: 253 10*3/uL (ref 145–400)
RBC: 3.11 10*6/uL — ABNORMAL LOW (ref 3.70–5.45)
RDW: 19.2 % — AB (ref 11.2–14.5)
WBC: 8.1 10*3/uL (ref 3.9–10.3)

## 2017-03-08 LAB — URINALYSIS, MICROSCOPIC - CHCC
BILIRUBIN (URINE): NEGATIVE
GLUCOSE UR CHCC: NEGATIVE mg/dL
Ketones: NEGATIVE mg/dL
NITRITE: NEGATIVE
PH: 6.5 (ref 4.6–8.0)
Protein: 30 mg/dL
SPECIFIC GRAVITY, URINE: 1.01 (ref 1.003–1.035)
UROBILINOGEN UR: 0.2 mg/dL (ref 0.2–1)

## 2017-03-08 NOTE — Telephone Encounter (Signed)
FYI

## 2017-03-08 NOTE — Progress Notes (Signed)
[facility] OFFICE PROGRESS NOTE   Diagnosis:  Non-Hodgkin's lymphoma  INTERVAL HISTORY:   Kelly Ray returns for follow-up. She is feeling much better. She is regaining some strength. Appetite is good. She reports weight gain. No fevers or sweats. No hematuria or dysuria. She is completing a course of nitrofurantoin. She has been referred to urology.  Objective:  Vital signs in last 24 hours:  Blood pressure 94/60, pulse 73, temperature 97.6 F (36.4 C), temperature source Oral, weight 143 lb 14.4 oz (65.3 kg), SpO2 100 %.    HEENT: No thrush or ulcers. Lymphatics: No palpable cervical, supraclavicular or axillary lymph nodes. Resp: Lungs clear bilaterally. Cardio: Regular rate and rhythm. GI: No hepatosplenomegaly. Vascular: No leg edema. Neuro: Alert and oriented.  Skin: No nodularity at the lower legs. Port-A-Cath without erythema.    Lab Results:  Lab Results  Component Value Date   WBC 8.1 03/08/2017   HGB 9.0 (L) 03/08/2017   HCT 27.8 (L) 03/08/2017   MCV 89.4 03/08/2017   PLT 253 03/08/2017   NEUTROABS 5.6 03/08/2017    Imaging:  No results found.  Medications: I have reviewed the patient's current medications.  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 Negative restaging CT evaluation 10/24/2012  Nodular skin lesions at the right lower leg, status post a shave biopsy 07/15/2013 confirming a malignant B-cell lymphoma, diffuse large cell positive for CD20, BCL 6, BCL 2, and CD10  Staging bone marrow biopsy 07/31/2013, negative for involvement with lymphoma  Staging PET scan 07/30/2013-negative.   Status post palliative radiation right lower leg nodular skin lesions 08/17/2013 through 09/03/2013.New nodular skin lesions at the right lower leg April 2017, status postpsies of lesions at the right lower leg 12/07/2015  confirming large B-cell lymphoma, CD20 positive bio  staging PET scan 12/26/2015-hypermetabolic cutaneous and subcutaneous nodules in the right lower extremity, no other evidence of lymphoma  Cycle 1 bendamustine/Rituxan 01/03/2016  Cycle 2 bendamustine/Rituxan 01/31/2016  Clinical progression with enlargement of multiple right lower leg skin lesions 02/23/2016  Status post radiation right lower leg 03/14/2016 through 03/29/2016  Clinical progression with new and enlarging skin lesions right lower leg 07/06/2016  PET scan 07/12/2016-clear interval increase in size and metabolic activity of a large nodule posterior right calf and a smaller subcutaneous nodule medial right thigh; several more superficial right lower extremity lesions improved with reduction in size and metabolic activity; single focus of metabolic activity associated with the medial right clavicle with bonychange  Cycle 1 CHOP/Rituxan 07/17/2016  Cycle 2 CHOP/Rituxan 08/07/2016-Neulasta added  Cycle 3 CHOP/Rituxan 08/28/2016 with Neulasta support  Restaging PET scan 09/14/2016-hypermetabolic subcutaneous nodules previously demonstrated within the right thigh and right calf have nearly resolved. There is some residual activity within the calf which could be postsurgical. Small focus of activity within the left latissimus dorsi muscle within the posterior chest wall without corresponding CT abnormality.  Cycle 4 CHOP/Rituxan 09/18/2016 (Adriamycin deleted, etoposide substituted daily 3)  Cycle 5 CHOP/rituximab with etoposide 10/08/2016  Cycle 6 CHOP/rituximab with etoposide 10/29/2016 2. Port-A-Cath placement 06/01/2011. Port-A-Cath removal 11/04/2012 3. Hypertension. 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 5. Hypercholesterolemia. 6. Osteoarthritis. 7. Malaise-? Secondary to progression of non-Hodgkin's lymphoma. Improved. 8. Staph infection right lower leg February 2015. 9. Right calf lesion with purulent drainage  02/09/2016, culture obtained-group B strep, doxycycline prescribed 10. Port-A-Cath placement 07/13/2016 11. 2-D echo 07/10/2016-estimated ejection fraction range of 55-60% 12. 07/17/2016 hepatitis Bsurface  antigen negative, core antibody positive 13. Anemia/thrombocytopenia-persistent following final chemotherapy 10/29/2016, the thrombocytopenia has resolved, anemia persists 14. Paresthesias, leg weakness, falls-negative brain CT 12/13/2016  Noncontrast brain MRI and MRIs cervical, thoracic, and lumbar spine negative on 12/19/2016  Status post lumbar puncture 12/20/2016; CSF negative for malignant cells 15. 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 Admission 02/19/2017 with acute renal failure-likely related to dehydration, improved    Disposition: Kelly Ray appears stable. She is recovering from the recent hospitalization for acute renal failure. There is no clinical evidence of progressive lymphoma.  She has been referred to urology for management of the recurrent urinary tract infections.  She has lost weight and blood pressure is low in the office today. Her blood pressure medications were adjusted in the hospital. Upon discharge she resumed what she was taking prior to the admission. I instructed her to hold the Hyzaar and to decrease the Lopressor to one half tablet 2 times daily as per discharge instructions. We will contact Dr. Nolon Rod office to request follow-up on this.  She will return for a follow-up visit here in 2-3 weeks. She will contact the office in the interim with any problems. 25 minutes were spent face-to-face at today's visit with the majority of that time involved in counseling/coordination of care.    Ned Card ANP/GNP-BC   03/08/2017  12:59 PM

## 2017-03-08 NOTE — Telephone Encounter (Signed)
Tonya from Dr. Benay Spice office called and wanted to let you know the pt has lost about 10lbs and she is taking the incorrect doseage of her metorptolol.   Contact number [phone]

## 2017-03-08 NOTE — Progress Notes (Signed)
Notified PCP office of weight loss and hypotension in office today. Requested they follow up with pt re: medication management. Informed she continued taking Metoprolol at higher dose and resumed Hyzaar after it was discontinued in the hospital.

## 2017-03-09 ENCOUNTER — Telehealth: Payer: Self-pay | Admitting: Oncology

## 2017-03-09 NOTE — Telephone Encounter (Signed)
Left message re 7/30 appointments. Schedule mailed.

## 2017-03-11 ENCOUNTER — Telehealth: Payer: Self-pay | Admitting: *Deleted

## 2017-03-11 NOTE — Telephone Encounter (Signed)
Called pt to inform her that Kelly Ray and Dr. Benay Spice recommended completing Macrobid prescription. Will await culture results to decide next steps. She and husband voiced understanding. She completed Macrobid on 7/15 and they are wary about being off antibiotic.  They stated they will call on 7/17 to check on result.

## 2017-03-12 ENCOUNTER — Telehealth: Payer: Self-pay | Admitting: Medical Oncology

## 2017-03-12 ENCOUNTER — Other Ambulatory Visit: Payer: Self-pay | Admitting: Medical Oncology

## 2017-03-12 NOTE — Telephone Encounter (Signed)
requests urine culture results

## 2017-03-12 NOTE — Telephone Encounter (Addendum)
I told pt the culture results are not back. She finished macrobid July 16th . She is  asking if she should take another antibiotic. She never had any UTI symptoms and does not have any now. She has hx of UTI's and this recent urine specimen  was to "stay on top of her UTI hx". I called lab and urine culture will take 5-7 days. She said in the past she had to be admitted for UTI's and she does not want this to happen again

## 2017-03-13 ENCOUNTER — Ambulatory Visit (INDEPENDENT_AMBULATORY_CARE_PROVIDER_SITE_OTHER): Payer: Medicare Other | Admitting: Family Medicine

## 2017-03-13 ENCOUNTER — Encounter: Payer: Self-pay | Admitting: Family Medicine

## 2017-03-13 VITALS — BP 104/72 | HR 78 | Temp 97.8°F | Resp 16

## 2017-03-13 DIAGNOSIS — N390 Urinary tract infection, site not specified: Secondary | ICD-10-CM | POA: Diagnosis not present

## 2017-03-13 LAB — POCT URINALYSIS DIP (MANUAL ENTRY)
Bilirubin, UA: NEGATIVE
GLUCOSE UA: NEGATIVE mg/dL
Ketones, POC UA: NEGATIVE mg/dL
Nitrite, UA: NEGATIVE
Protein Ur, POC: NEGATIVE mg/dL
UROBILINOGEN UA: 0.2 U/dL
pH, UA: 5.5 (ref 5.0–8.0)

## 2017-03-13 LAB — URINE CULTURE

## 2017-03-13 NOTE — Patient Instructions (Signed)
     IF you received an x-ray today, you will receive an invoice from [facility]. Please contact [facility] at [phone] with questions or concerns regarding your invoice.   IF you received labwork today, you will receive an invoice from [facility]. Please contact [facility] at [phone] with questions or concerns regarding your invoice.   Our billing staff will not be able to assist you with questions regarding bills from these companies.  You will be contacted with the lab results as soon as they are available. The fastest way to get your results is to activate your My Chart account. Instructions are located on the last page of this paperwork. If you have not heard from us regarding the results in 2 weeks, please contact this office.     

## 2017-03-13 NOTE — Telephone Encounter (Addendum)
Message from pt to follow up on urine culture result. Sensitivity is not yet resulted. Issues with [facility]. Will follow up. Pt informed of reason for delay. She denies symptoms at this time. Instructed her to call if she develops symptoms of UTI.

## 2017-03-13 NOTE — Progress Notes (Signed)
Chief Complaint  Patient presents with  . Cancer    follow up since being in hospital  . Follow-up    wants urine checked for bacteria     HPI  Pt was discharged from the hospital and would like to get a urine test She has recurrent uti She also has continued weight loss She denies dysuria She has not fevers  Continues to have some appetite suppression  Wt Readings from Last 3 Encounters:  03/25/17 158 lb 15.2 oz (72.1 kg)  03/08/17 143 lb 14.4 oz (65.3 kg)  02/19/17 152 lb 1.9 oz (69 kg)    Past Medical History:  Diagnosis Date  . Anemia    SECONDARY TO CHEMOTHERAPY  . Arthritis    Osteoarthritis  . 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 Cancer ([place])    NHL  . Hx of radiation therapy 08/17/13- 09/03/13   right lower extremity- lymphoma  . Hypertension   . S/P knee replacement    Left knee  . Thyroid disease    Hyperthyroidism    Current Outpatient Prescriptions  Medication Sig Dispense Refill  . ALPRAZolam (XANAX) 0.25 MG tablet Take 1 tablet (0.25 mg total) by mouth at bedtime as needed for anxiety. 30 tablet 0  . Cyanocobalamin (VITAMIN B-12 PO) Take 1 tablet by mouth daily.    Marland Kitchen levothyroxine (SYNTHROID, LEVOTHROID) 112 MCG tablet Take 1 tablet daily 90 tablet 3  . metoprolol tartrate (LOPRESSOR) 25 MG tablet Take 0.5 tablets (12.5 mg total) by mouth 2 (two) times daily. (Patient taking differently: Take 25 mg by mouth 2 (two) times daily. ) 15 tablet 0  . pravastatin (PRAVACHOL) 40 MG tablet Take 1 tablet (40 mg total) by mouth at bedtime. 30 tablet 0  . acetaminophen (TYLENOL) 325 MG tablet Take 650 mg by mouth every 6 (six) hours as needed for mild pain.    Marland Kitchen aspirin EC 81 MG EC tablet Take 1 tablet (81 mg total) by mouth daily. 30 tablet 1  . feeding supplement, ENSURE ENLIVE, (ENSURE ENLIVE) LIQD Take 237 mLs by mouth 2 (two) times daily between meals. 237 mL 12  . ferrous sulfate 325 (65 FE) MG tablet Take 1 tablet (325 mg total) by  mouth 2 (two) times daily with a meal. 60 tablet 3  . Magnesium Oxide 400 MG CAPS Take 1 capsule (400 mg total) by mouth every morning. 30 each 2  . prochlorperazine (COMPAZINE) 10 MG tablet Take 1 tablet (10 mg total) by mouth every 6 (six) hours as needed for nausea or vomiting. 30 tablet 0   No current facility-administered medications for this visit.     Allergies:  Allergies  Allergen Reactions  . Lisinopril Cough    Past Surgical History:  Procedure Laterality Date  . IR GENERIC HISTORICAL  07/13/2016   IR US GUIDE VASC ACCESS RIGHT 07/13/2016 [facility]  . IR GENERIC HISTORICAL  07/13/2016   IR FLUORO GUIDE PORT INSERTION RIGHT 07/13/2016 [facility]  . NECK LESION BIOPSY Right 05/04/11   node biopsies  . TOTAL KNEE ARTHROPLASTY Left    x 2    Social History   Social History  . Marital status: Married    Spouse name: N/A  . Number of children: N/A  . Years of education: N/A   Social History Main Topics  . Smoking status: Never Smoker  . Smokeless tobacco: Never Used  . Alcohol use No  . Drug use: No  . Sexual  activity: Not Asked   Other Topics Concern  . None   Social History Narrative  . None    ROS  Objective: Vitals:   03/13/17 1345  BP: 104/72  Pulse: 78  Resp: 16  Temp: 97.8 F (36.6 C)  TempSrc: Oral  SpO2: 100%    Physical Exam Physical Exam  Constitutional: She is oriented to person, place, and time. She appears thin and pale HENT:  Head: Normocephalic and atraumatic.  Eyes: Conjunctivae and EOM are normal.  Cardiovascular: Normal rate, regular rhythm and normal heart sounds.   Pulmonary/Chest: Effort normal and breath sounds normal. No respiratory distress. She has no wheezes.  Abdominal: Normal appearance and bowel sounds are normal. There is no tenderness. There is no CVA tenderness.  Neurological: She is alert and oriented to person, place, and time.     03/08/17-  Urine culture shows mixed urogenital flora  Assessment  and Plan Kelly Ray was seen today for cancer and follow-up.  Diagnoses and all orders for this visit:  Frequent UTI -     POCT urinalysis dipstick -     Urine Microscopic -     Urine Culture  urine dip showed large LE, trace blood Urine micro showed no bacterial or wbc Will send for culture and follow closely Will not start meds now  Will await culture   Kelly Ray A Nolon Rod

## 2017-03-14 LAB — URINALYSIS, MICROSCOPIC ONLY
Bacteria, UA: NONE SEEN
Casts: NONE SEEN /lpf
Epithelial Cells (non renal): NONE SEEN /hpf (ref 0–10)
RBC, UA: NONE SEEN /hpf (ref 0–?)

## 2017-03-14 LAB — URINE CULTURE: ORGANISM ID, BACTERIA: NO GROWTH

## 2017-03-15 ENCOUNTER — Other Ambulatory Visit: Payer: Self-pay | Admitting: *Deleted

## 2017-03-15 MED ORDER — PROCHLORPERAZINE MALEATE 10 MG PO TABS: 10.0000 mg | ORAL_TABLET | Freq: Four times a day (QID) | ORAL | 0 refills | Status: AC | PRN

## 2017-03-18 ENCOUNTER — Telehealth: Payer: Self-pay | Admitting: *Deleted

## 2017-03-18 NOTE — Telephone Encounter (Signed)
Pt called to let Dr Benay Spice know she has had a terrible headache for 4-5 days. 8/10, back of head, throbbing. She is photo sensitive, no nausea. She is very nervous and her hands shake. She has not been using her xanax in the last week- she will take on when phone call finished. She has used tylenol 325 mg and will repeat prn, without help. She is very wobbly on her feet, she has not fallen. This has happened before but has been worse in last few weeks. Instructed to try ice pack on head.  Is there anything else to do for the headache? Her urine culture from 7/18 showed no growth  F/u Dr Learta Codding 7/30

## 2017-03-18 NOTE — Telephone Encounter (Signed)
Called pt, Dr. Benay Spice recommends seeing neurology for the headaches and "shakiness". Not likely related to lymphoma. Pt is currently taking Tylenol 325 mg for headache. Informed her it is OK to take 650 mg Q6hours as needed. She voiced understanding.

## 2017-03-18 NOTE — Telephone Encounter (Signed)
Message from pt reporting "headaches and nervousness."

## 2017-03-18 NOTE — Telephone Encounter (Signed)
Message received by Dr Vernell Leep nurse - need to forward to Dr Gearldine Shown nurse.  Forwarded by this RN

## 2017-03-19 NOTE — Telephone Encounter (Signed)
Return call.  "I have severe headaches.  Was told to take baby aspirin.  Need to know what king, what dose..."  Spouse on line.  "She's a little upset.  Needs something for her headache pains."  This nurse read yesterday's information to see neurologist and take TYLENOL.  "Has taken tylenol two hours ago.  Should be working by now.  I will contact Dr.Ather."  Encouraged to use two Tylenol every six hours.  Due again at 5:00 pm.  No further questions.

## 2017-03-20 ENCOUNTER — Telehealth: Payer: Self-pay

## 2017-03-20 NOTE — Telephone Encounter (Signed)
Spoke with pt's husband, he reports she feels better after taking Xanax 0.125 mg. She is "calm and relaxed." She became frustrated that she couldn't get in with the neurologist any sooner. He denies any vision or gait changes. She is scheduled to see PCP on 7/27.  Per Dr. Benay Spice, [place] to continue Xanax Q6hours PRN anxiety.

## 2017-03-20 NOTE — Telephone Encounter (Signed)
Pt called with "a lot of problems". Called pt back. Pt does not feel any better from 2 days ago. Shaking, a nervous wreck, can hardly stand. She cannot find a neurologist who will take her until September 22. "I cannot take this anymore."  Her PCP cannot see her for 30 days. She was teary on the phone.  Husband is asking for something to calm her down until she can see a neurologist.

## 2017-03-22 ENCOUNTER — Ambulatory Visit (INDEPENDENT_AMBULATORY_CARE_PROVIDER_SITE_OTHER): Payer: Medicare Other | Admitting: Family Medicine

## 2017-03-22 ENCOUNTER — Inpatient Hospital Stay (HOSPITAL_COMMUNITY)
Admission: EM | Admit: 2017-03-22 | Discharge: 2017-03-26 | DRG: 871 | Disposition: A | Discharge status: Home Health | Payer: Medicare Other | Attending: Internal Medicine | Admitting: Internal Medicine

## 2017-03-22 ENCOUNTER — Encounter: Payer: Self-pay | Admitting: Family Medicine

## 2017-03-22 ENCOUNTER — Telehealth: Payer: Self-pay | Admitting: Family Medicine

## 2017-03-22 ENCOUNTER — Encounter (HOSPITAL_COMMUNITY): Payer: Self-pay | Admitting: Emergency Medicine

## 2017-03-22 ENCOUNTER — Emergency Department (HOSPITAL_COMMUNITY): Payer: Medicare Other

## 2017-03-22 VITALS — BP 114/63 | HR 116 | Temp 97.7°F | Resp 16

## 2017-03-22 DIAGNOSIS — E7800 Pure hypercholesterolemia, unspecified: Secondary | ICD-10-CM | POA: Diagnosis present

## 2017-03-22 DIAGNOSIS — Y92009 Unspecified place in unspecified non-institutional (private) residence as the place of occurrence of the external cause: Secondary | ICD-10-CM

## 2017-03-22 DIAGNOSIS — A419 Sepsis, unspecified organism: Secondary | ICD-10-CM | POA: Diagnosis present

## 2017-03-22 DIAGNOSIS — N170 Acute kidney failure with tubular necrosis: Secondary | ICD-10-CM | POA: Diagnosis not present

## 2017-03-22 DIAGNOSIS — W19XXXA Unspecified fall, initial encounter: Secondary | ICD-10-CM | POA: Diagnosis not present

## 2017-03-22 DIAGNOSIS — D61818 Other pancytopenia: Secondary | ICD-10-CM | POA: Diagnosis present

## 2017-03-22 DIAGNOSIS — G44201 Tension-type headache, unspecified, intractable: Secondary | ICD-10-CM | POA: Diagnosis not present

## 2017-03-22 DIAGNOSIS — Z6829 Body mass index (BMI) 29.0-29.9, adult: Secondary | ICD-10-CM

## 2017-03-22 DIAGNOSIS — M1990 Unspecified osteoarthritis, unspecified site: Secondary | ICD-10-CM | POA: Diagnosis present

## 2017-03-22 DIAGNOSIS — I10 Essential (primary) hypertension: Secondary | ICD-10-CM | POA: Diagnosis not present

## 2017-03-22 DIAGNOSIS — N183 Chronic kidney disease, stage 3 unspecified: Secondary | ICD-10-CM | POA: Diagnosis present

## 2017-03-22 DIAGNOSIS — D631 Anemia in chronic kidney disease: Secondary | ICD-10-CM | POA: Diagnosis present

## 2017-03-22 DIAGNOSIS — N390 Urinary tract infection, site not specified: Secondary | ICD-10-CM | POA: Diagnosis present

## 2017-03-22 DIAGNOSIS — D638 Anemia in other chronic diseases classified elsewhere: Secondary | ICD-10-CM

## 2017-03-22 DIAGNOSIS — E039 Hypothyroidism, unspecified: Secondary | ICD-10-CM | POA: Diagnosis present

## 2017-03-22 DIAGNOSIS — D509 Iron deficiency anemia, unspecified: Secondary | ICD-10-CM | POA: Diagnosis present

## 2017-03-22 DIAGNOSIS — I130 Hypertensive heart and chronic kidney disease with heart failure and stage 1 through stage 4 chronic kidney disease, or unspecified chronic kidney disease: Secondary | ICD-10-CM | POA: Diagnosis present

## 2017-03-22 DIAGNOSIS — R531 Weakness: Secondary | ICD-10-CM

## 2017-03-22 DIAGNOSIS — C8335 Diffuse large B-cell lymphoma, lymph nodes of inguinal region and lower limb: Secondary | ICD-10-CM | POA: Diagnosis present

## 2017-03-22 DIAGNOSIS — I5023 Acute on chronic systolic (congestive) heart failure: Secondary | ICD-10-CM | POA: Diagnosis present

## 2017-03-22 DIAGNOSIS — E785 Hyperlipidemia, unspecified: Secondary | ICD-10-CM | POA: Diagnosis present

## 2017-03-22 DIAGNOSIS — N12 Tubulo-interstitial nephritis, not specified as acute or chronic: Secondary | ICD-10-CM | POA: Diagnosis present

## 2017-03-22 DIAGNOSIS — Z87440 Personal history of urinary (tract) infections: Secondary | ICD-10-CM

## 2017-03-22 DIAGNOSIS — C801 Malignant (primary) neoplasm, unspecified: Secondary | ICD-10-CM | POA: Diagnosis not present

## 2017-03-22 DIAGNOSIS — R627 Adult failure to thrive: Secondary | ICD-10-CM | POA: Diagnosis not present

## 2017-03-22 DIAGNOSIS — N179 Acute kidney failure, unspecified: Secondary | ICD-10-CM | POA: Diagnosis present

## 2017-03-22 DIAGNOSIS — D649 Anemia, unspecified: Secondary | ICD-10-CM | POA: Diagnosis not present

## 2017-03-22 DIAGNOSIS — Z923 Personal history of irradiation: Secondary | ICD-10-CM | POA: Diagnosis not present

## 2017-03-22 DIAGNOSIS — D62 Acute posthemorrhagic anemia: Secondary | ICD-10-CM | POA: Diagnosis present

## 2017-03-22 DIAGNOSIS — T451X5A Adverse effect of antineoplastic and immunosuppressive drugs, initial encounter: Secondary | ICD-10-CM

## 2017-03-22 DIAGNOSIS — R410 Disorientation, unspecified: Secondary | ICD-10-CM | POA: Diagnosis not present

## 2017-03-22 DIAGNOSIS — D630 Anemia in neoplastic disease: Secondary | ICD-10-CM | POA: Diagnosis present

## 2017-03-22 DIAGNOSIS — Z79899 Other long term (current) drug therapy: Secondary | ICD-10-CM

## 2017-03-22 DIAGNOSIS — G620 Drug-induced polyneuropathy: Secondary | ICD-10-CM | POA: Diagnosis present

## 2017-03-22 DIAGNOSIS — E440 Moderate protein-calorie malnutrition: Secondary | ICD-10-CM | POA: Diagnosis present

## 2017-03-22 DIAGNOSIS — I4892 Unspecified atrial flutter: Secondary | ICD-10-CM | POA: Diagnosis present

## 2017-03-22 DIAGNOSIS — E860 Dehydration: Secondary | ICD-10-CM | POA: Diagnosis present

## 2017-03-22 DIAGNOSIS — I340 Nonrheumatic mitral (valve) insufficiency: Secondary | ICD-10-CM | POA: Diagnosis not present

## 2017-03-22 DIAGNOSIS — N10 Acute tubulo-interstitial nephritis: Secondary | ICD-10-CM | POA: Diagnosis not present

## 2017-03-22 DIAGNOSIS — R350 Frequency of micturition: Secondary | ICD-10-CM | POA: Diagnosis present

## 2017-03-22 DIAGNOSIS — I248 Other forms of acute ischemic heart disease: Secondary | ICD-10-CM | POA: Diagnosis present

## 2017-03-22 DIAGNOSIS — R778 Other specified abnormalities of plasma proteins: Secondary | ICD-10-CM | POA: Diagnosis present

## 2017-03-22 DIAGNOSIS — Z96652 Presence of left artificial knee joint: Secondary | ICD-10-CM | POA: Diagnosis present

## 2017-03-22 DIAGNOSIS — R748 Abnormal levels of other serum enzymes: Secondary | ICD-10-CM

## 2017-03-22 DIAGNOSIS — R7989 Other specified abnormal findings of blood chemistry: Secondary | ICD-10-CM | POA: Diagnosis present

## 2017-03-22 LAB — CBC WITH DIFFERENTIAL/PLATELET
BASOS ABS: 0 10*3/uL (ref 0.0–0.1)
BASOS PCT: 0 %
Eosinophils Absolute: 0 10*3/uL (ref 0.0–0.7)
Eosinophils Relative: 0 %
HEMATOCRIT: 27.6 % — AB (ref 36.0–46.0)
HEMOGLOBIN: 8.8 g/dL — AB (ref 12.0–15.0)
LYMPHS PCT: 24 %
Lymphs Abs: 1.3 10*3/uL (ref 0.7–4.0)
MCH: 28 pg (ref 26.0–34.0)
MCHC: 31.9 g/dL (ref 30.0–36.0)
MCV: 87.9 fL (ref 78.0–100.0)
MONOS PCT: 16 %
Monocytes Absolute: 0.9 10*3/uL (ref 0.1–1.0)
NEUTROS ABS: 3.3 10*3/uL (ref 1.7–7.7)
NEUTROS PCT: 60 %
Platelets: 196 10*3/uL (ref 150–400)
RBC: 3.14 MIL/uL — ABNORMAL LOW (ref 3.87–5.11)
RDW: 19.1 % — ABNORMAL HIGH (ref 11.5–15.5)
WBC MORPHOLOGY: INCREASED
WBC: 5.5 10*3/uL (ref 4.0–10.5)

## 2017-03-22 LAB — COMPREHENSIVE METABOLIC PANEL
ALBUMIN: 3.1 g/dL — AB (ref 3.5–5.0)
ALK PHOS: 129 U/L — AB (ref 38–126)
ALT: 13 U/L — AB (ref 14–54)
ANION GAP: 11 (ref 5–15)
AST: 18 U/L (ref 15–41)
BILIRUBIN TOTAL: 0.8 mg/dL (ref 0.3–1.2)
BUN: 37 mg/dL — ABNORMAL HIGH (ref 6–20)
CALCIUM: 9.3 mg/dL (ref 8.9–10.3)
CO2: 16 mmol/L — ABNORMAL LOW (ref 22–32)
CREATININE: 2.14 mg/dL — AB (ref 0.44–1.00)
Chloride: 105 mmol/L (ref 101–111)
GFR calc Af Amer: 25 mL/min — ABNORMAL LOW (ref >60)
GFR calc non Af Amer: 22 mL/min — ABNORMAL LOW (ref >60)
Glucose, Bld: 111 mg/dL — ABNORMAL HIGH (ref 65–99)
Potassium: 4.9 mmol/L (ref 3.5–5.1)
SODIUM: 132 mmol/L — AB (ref 135–145)
TOTAL PROTEIN: 6.3 g/dL — AB (ref 6.5–8.1)

## 2017-03-22 LAB — URINALYSIS, ROUTINE W REFLEX MICROSCOPIC
BILIRUBIN URINE: NEGATIVE
Glucose, UA: NEGATIVE mg/dL
KETONES UR: NEGATIVE mg/dL
Nitrite: POSITIVE — AB
PH: 6 (ref 5.0–8.0)
PROTEIN: 100 mg/dL — AB
Specific Gravity, Urine: 1.012 (ref 1.005–1.030)

## 2017-03-22 LAB — TROPONIN I: TROPONIN I: 0.05 ng/mL — AB (ref <0.03)

## 2017-03-22 LAB — I-STAT TROPONIN, ED: Troponin i, poc: 0.03 ng/mL (ref 0.00–0.08)

## 2017-03-22 LAB — PROCALCITONIN: Procalcitonin: 0.12 ng/mL

## 2017-03-22 LAB — LACTIC ACID, PLASMA: Lactic Acid, Venous: 0.8 mmol/L (ref 0.5–1.9)

## 2017-03-22 LAB — I-STAT CG4 LACTIC ACID, ED: LACTIC ACID, VENOUS: 0.58 mmol/L (ref 0.5–1.9)

## 2017-03-22 MED ORDER — VITAMIN B-12 100 MCG PO TABS
100.0000 ug | ORAL_TABLET | Freq: Every day | ORAL | Status: DC
  Administered 2017-03-23 – 2017-03-26 (×4): 100 ug via ORAL
  Filled 2017-03-22 (×4): qty 1

## 2017-03-22 MED ORDER — ONDANSETRON HCL 4 MG PO TABS: 4.0000 mg | ORAL_TABLET | Freq: Four times a day (QID) | ORAL | Status: DC | PRN

## 2017-03-22 MED ORDER — SODIUM CHLORIDE 0.9 % IV SOLN
500.0000 mg | Freq: Two times a day (BID) | INTRAVENOUS | Status: DC
  Administered 2017-03-22 – 2017-03-25 (×7): 500 mg via INTRAVENOUS
  Filled 2017-03-22 (×8): qty 0.5

## 2017-03-22 MED ORDER — ZOLPIDEM TARTRATE 5 MG PO TABS
5.0000 mg | ORAL_TABLET | Freq: Every evening | ORAL | Status: DC | PRN
  Administered 2017-03-22 – 2017-03-25 (×4): 5 mg via ORAL
  Filled 2017-03-22 (×4): qty 1

## 2017-03-22 MED ORDER — METOPROLOL TARTRATE 12.5 MG HALF TABLET
12.5000 mg | ORAL_TABLET | Freq: Two times a day (BID) | ORAL | Status: DC
  Administered 2017-03-22 – 2017-03-23 (×2): 12.5 mg via ORAL
  Filled 2017-03-22 (×4): qty 1

## 2017-03-22 MED ORDER — ACETAMINOPHEN 325 MG PO TABS
650.0000 mg | ORAL_TABLET | Freq: Four times a day (QID) | ORAL | Status: DC | PRN
  Administered 2017-03-23 – 2017-03-26 (×8): 650 mg via ORAL
  Filled 2017-03-22 (×8): qty 2

## 2017-03-22 MED ORDER — DEXTROSE 5 % IV SOLN
1.0000 g | Freq: Once | INTRAVENOUS | Status: DC
  Administered 2017-03-22: 1 g via INTRAVENOUS
  Filled 2017-03-22: qty 10

## 2017-03-22 MED ORDER — LEVOTHYROXINE SODIUM 112 MCG PO TABS
112.0000 ug | ORAL_TABLET | Freq: Every day | ORAL | Status: DC
  Administered 2017-03-23 – 2017-03-25 (×3): 112 ug via ORAL
  Filled 2017-03-22 (×4): qty 1

## 2017-03-22 MED ORDER — SODIUM CHLORIDE 0.9 % IV BOLUS (SEPSIS)
1000.0000 mL | Freq: Once | INTRAVENOUS | Status: AC
  Administered 2017-03-22: 1000 mL via INTRAVENOUS

## 2017-03-22 MED ORDER — ONDANSETRON HCL 4 MG/2ML IJ SOLN
4.0000 mg | Freq: Four times a day (QID) | INTRAMUSCULAR | Status: DC | PRN
  Administered 2017-03-25: 4 mg via INTRAVENOUS
  Filled 2017-03-22: qty 2

## 2017-03-22 MED ORDER — ALPRAZOLAM 0.25 MG PO TABS
0.2500 mg | ORAL_TABLET | Freq: Every evening | ORAL | Status: DC | PRN
  Administered 2017-03-26: 0.25 mg via ORAL
  Filled 2017-03-22: qty 1

## 2017-03-22 MED ORDER — PROCHLORPERAZINE MALEATE 10 MG PO TABS
10.0000 mg | ORAL_TABLET | Freq: Four times a day (QID) | ORAL | Status: DC | PRN
  Filled 2017-03-22: qty 1

## 2017-03-22 MED ORDER — DEXTROSE 5 % IV SOLN: 2.0000 g | Freq: Once | INTRAVENOUS | Status: DC

## 2017-03-22 MED ORDER — SODIUM CHLORIDE 0.9 % IV SOLN
1000.0000 mL | INTRAVENOUS | Status: DC
  Administered 2017-03-22: 1000 mL via INTRAVENOUS

## 2017-03-22 MED ORDER — ENOXAPARIN SODIUM 40 MG/0.4ML ~~LOC~~ SOLN: 40.0000 mg | SUBCUTANEOUS | Status: DC

## 2017-03-22 MED ORDER — ENOXAPARIN SODIUM 30 MG/0.3ML ~~LOC~~ SOLN
30.0000 mg | SUBCUTANEOUS | Status: DC
  Administered 2017-03-23 – 2017-03-24 (×2): 30 mg via SUBCUTANEOUS
  Filled 2017-03-22 (×2): qty 0.3

## 2017-03-22 MED ORDER — DEXTROSE 5 % IV SOLN: 1.0000 g | INTRAVENOUS | Status: DC

## 2017-03-22 MED ORDER — ASPIRIN EC 81 MG PO TBEC
81.0000 mg | DELAYED_RELEASE_TABLET | Freq: Every day | ORAL | Status: DC
  Administered 2017-03-22 – 2017-03-26 (×5): 81 mg via ORAL
  Filled 2017-03-22 (×5): qty 1

## 2017-03-22 MED ORDER — SODIUM CHLORIDE 0.9 % IV SOLN
INTRAVENOUS | Status: DC
  Administered 2017-03-22 – 2017-03-26 (×8): via INTRAVENOUS

## 2017-03-22 MED ORDER — PRAVASTATIN SODIUM 40 MG PO TABS
40.0000 mg | ORAL_TABLET | Freq: Every day | ORAL | Status: DC
  Administered 2017-03-22 – 2017-03-25 (×4): 40 mg via ORAL
  Filled 2017-03-22 (×4): qty 1

## 2017-03-22 NOTE — ED Triage Notes (Signed)
Pt to ER sent by doctor for dehydration and concern for losing 10+ lbs in less than a week and a half. Pt also reports 2 weeks of dizziness and worsening generalized weakness. 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 Port is present. Denies fevers. Pt is alert and oriented, slow to answer.

## 2017-03-22 NOTE — ED Provider Notes (Signed)
[facility] DEPT Provider Note   CSN: [id number] Arrival date & time: 03/22/17  1636     History   Chief Complaint Chief Complaint  Patient presents with  . Dehydration  . Dizziness    HPI Kelly Ray is a 76 y.o. female is in the emergency room for 1 week history of weakness, dizziness, and 10 pound weight loss.  Patient sent from primary care for the above problems and tachycardia. Past medical history of lymphoma for which she finished chemotherapy 1 month prior, and frequent UTIs causing kidney injury. Patient finished course of antibiotics 1 week prior (unknown abx), and was told at primary care today that her urine was clean. Pt is incontinent, and wears depends.   She reports that for the past week, she has had extreme dizziness every time she stands up. She feels like the room is spinning at that point. When she is lying down at rest, even moving her head from side to side, she does not have the symptoms. She is so dizzy that she is unable to walk. Additionally, patient reports a 10 pound weight loss. She states her appetite has been good. She has been eating and drinking well. Patient reports a mild headache, and she has had this persistent headache in this same area for the past 2 weeks. She denies fever, chills, cough, chest pain, shortness of breath, palpitations or feelings of tachycardia, nausea, vomiting, abdominal pain, urinary symptoms, abnormal bowel movements, or numbness or tingling. She denies vision changes. Husband states that she gets more confused and has slurred speech toward the end of the day. This has been evaluated by neurology, and MRI in June 2018 negative for acute findings.  HPI  Past Medical History:  Diagnosis Date  . Anemia    SECONDARY TO CHEMOTHERAPY  . Arthritis    Osteoarthritis  . 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 Cancer ([place])    NHL  . Hx of radiation therapy 08/17/13- 09/03/13   right lower extremity- lymphoma   . Hypertension   . S/P knee replacement    Left knee  . Thyroid disease    Hyperthyroidism    Patient Active Problem List   Diagnosis Date Noted  . Elevated troponin 03/22/2017  . Acute renal failure superimposed on stage 3 chronic kidney disease ([place]) 03/22/2017  . Normocytic anemia 03/22/2017  . Protein-calorie malnutrition, severe 02/21/2017  . Pressure injury of skin 02/20/2017  . Acute renal failure ([place])   . Hyperkalemia   . Sepsis ([place]) 02/19/2017  . UTI due to extended-spectrum beta lactamase (ESBL) producing Escherichia coli   . AKI (acute kidney injury) ([place])   . Ataxia 12/19/2016  . Acute lower UTI   . Peripheral edema 11/27/2016  . Hypotension 08/21/2016  . Dehydration 08/21/2016  . Antineoplastic chemotherapy induced pancytopenia (CODE) ([place]) 08/21/2016  . Esophagitis 08/21/2016  . UTI (urinary tract infection) 08/21/2016  . Port catheter in place 08/17/2016  . 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 Hypercholesterolemia 08/06/2013  . Hypertension 08/06/2013  . Hypothyroid 08/06/2013  . B-cell lymphoma ([place]) 07/17/2011    Past Surgical History:  Procedure Laterality Date  . IR GENERIC HISTORICAL  07/13/2016   IR US GUIDE VASC ACCESS RIGHT 07/13/2016 [facility]  . IR GENERIC HISTORICAL  07/13/2016   IR FLUORO GUIDE PORT INSERTION RIGHT 07/13/2016 [facility]  . NECK LESION BIOPSY Right 05/04/11   node biopsies  . TOTAL  KNEE ARTHROPLASTY Left    x 2    OB History    No data available       Home Medications    Prior to Admission medications   Medication Sig Start Date End Date Taking? Authorizing Provider  acetaminophen (TYLENOL) 325 MG tablet Take 650 mg by mouth every 6 (six) hours as needed for mild pain.   Yes [provider]  ALPRAZolam (XANAX) 0.25 MG tablet Take 1 tablet (0.25 mg total) by mouth at bedtime as needed for anxiety. 01/17/17  Yes [name], MD  Cyanocobalamin  (VITAMIN B-12 PO) Take 1 tablet by mouth daily.   Yes [provider]  levothyroxine (SYNTHROID, LEVOTHROID) 112 MCG tablet Take 1 tablet daily 05/08/16  Yes Daub, Loura Back, MD  metoprolol tartrate (LOPRESSOR) 25 MG tablet Take 0.5 tablets (12.5 mg total) by mouth 2 (two) times daily. 02/21/17  Yes Orson Eva J, DO  pravastatin (PRAVACHOL) 40 MG tablet Take 1 tablet (40 mg total) by mouth at bedtime. 12/23/16  Yes Rosita Fire, MD  prochlorperazine (COMPAZINE) 10 MG tablet Take 1 tablet (10 mg total) by mouth every 6 (six) hours as needed for nausea or vomiting. 03/15/17  Yes Owens Shark, NP    Family History Family History  Problem Relation Age of Onset  . Cancer Mother        bladder  . Heart attack Father   . Cancer Brother        kidney    Social History Social History  Substance Use Topics  . Smoking status: Never Smoker  . Smokeless tobacco: Never Used  . Alcohol use No     Allergies   Lisinopril   Review of Systems Review of Systems  Constitutional: Positive for unexpected weight change.  Neurological: Positive for dizziness and headaches.  All other systems reviewed and are negative.    Physical Exam Updated Vital Signs BP 111/75   Pulse 100   Temp 98.8 F (37.1 C) (Oral)   Resp (!) 26   Ht 5\' 2"  (1.575 m)   Wt 63.5 kg (140 lb)   SpO2 99%   BMI 25.61 kg/m    Vitals:   03/22/17 1900 03/22/17 1915 03/22/17 1930 03/22/17 1945  BP: 119/67 120/80 92/76 111/75  Pulse: 91 95 93 100  Resp: (!) 23 20 (!) 21 (!) 26  Temp:      TempSrc:      SpO2: 98% 100% 100% 99%  Weight:      Height:         Physical Exam  Constitutional: She is oriented to person, place, and time. She appears well-developed and well-nourished.  HENT:  Head: Normocephalic and atraumatic.  Right Ear: Tympanic membrane, external ear and ear canal normal.  Left Ear: Tympanic membrane, external ear and ear canal normal.  Nose: Nose normal.  Mouth/Throat: Uvula is  midline and oropharynx is clear and moist. Mucous membranes are dry.  Eyes: Pupils are equal, round, and reactive to light. Conjunctivae and EOM are normal.  No nystagmus  Neck: Normal range of motion. Neck supple.  Cardiovascular: Regular rhythm and intact distal pulses.  Tachycardia present.   Pulmonary/Chest: Effort normal and breath sounds normal. Tachypnea noted. No respiratory distress. She has no wheezes.  Abdominal: Soft. Bowel sounds are normal. She exhibits no distension and no mass. There is no tenderness. There is no guarding.  Musculoskeletal: Normal range of motion.  Lymphadenopathy:    She has no cervical  adenopathy.  Neurological: She is alert and oriented to person, place, and time. She has normal strength and normal reflexes. No cranial nerve deficit or sensory deficit. GCS eye subscore is 4. GCS verbal subscore is 5. GCS motor subscore is 6.  Fine movement and coordination intact.  Skin: Skin is warm and dry.  Psychiatric: She has a normal mood and affect.  Nursing note and vitals reviewed.    ED Treatments / Results  Labs (all labs ordered are listed, but only abnormal results are displayed) Labs Reviewed  CBC WITH DIFFERENTIAL/PLATELET - Abnormal; Notable for the following:       Result Value   RBC 3.14 (*)    Hemoglobin 8.8 (*)    HCT 27.6 (*)    RDW 19.1 (*)    All other components within normal limits  COMPREHENSIVE METABOLIC PANEL - Abnormal; Notable for the following:    Sodium 132 (*)    CO2 16 (*)    Glucose, Bld 111 (*)    BUN 37 (*)    Creatinine, Ser 2.14 (*)    Total Protein 6.3 (*)    Albumin 3.1 (*)    ALT 13 (*)    Alkaline Phosphatase 129 (*)    GFR calc non Af Amer 22 (*)    GFR calc Af Amer 25 (*)    All other components within normal limits  URINALYSIS, ROUTINE W REFLEX MICROSCOPIC - Abnormal; Notable for the following:    APPearance CLOUDY (*)    Hgb urine dipstick MODERATE (*)    Protein, ur 100 (*)    Nitrite POSITIVE (*)     Leukocytes, UA LARGE (*)    Bacteria, UA RARE (*)    Squamous Epithelial / LPF 0-5 (*)    Non Squamous Epithelial 0-5 (*)    All other components within normal limits  TROPONIN I - Abnormal; Notable for the following:    Troponin I 0.05 (*)    All other components within normal limits  URINE CULTURE  CULTURE, BLOOD (ROUTINE X 2)  CULTURE, BLOOD (ROUTINE X 2)  I-STAT TROPONIN, ED  I-STAT CG4 LACTIC ACID, ED    EKG  EKG Interpretation  Date/Time:  Friday March 22 2017 16:51:59 EDT Ventricular Rate:  136 PR Interval:  158 QRS Duration: 74 QT Interval:  270 QTC Calculation: 406 R Axis:   -21 Text Interpretation:  Sinus tachycardia with Premature ventricular complexes or Fusion complexes Septal infarct , age undetermined Abnormal ECG subtle ST depression laterally Confirmed by Malvin Johns [phone]) on 03/22/2017 5:28:35 PM       Radiology Dg Chest 2 View  Result Date: 03/22/2017 CLINICAL DATA:  Tachycardia. Patient is unable lift the arms for positioning. EXAM: CHEST  2 VIEW COMPARISON:  06/07/2015 FINDINGS: Since the previous study, there is interval placement of a power port type central venous catheter. Tip appears to project over the low SVC region. No pneumothorax. Multiple EKG wires and leads limit evaluation. Heart size and pulmonary vascularity are normal. Lungs appear clear. No blunting of costophrenic angles. No pneumothorax. Degenerative changes in the shoulders. IMPRESSION: No active cardiopulmonary disease. Electronically Signed   By: Lucienne Capers M.D.   On: 03/22/2017 18:42    Procedures Procedures (including critical care time)  Medications Ordered in ED Medications  sodium chloride 0.9 % bolus 1,000 mL (0 mLs Intravenous Stopped 03/22/17 1904)    Followed by  0.9 %  sodium chloride infusion (1,000 mLs Intravenous New Bag/Given 03/22/17 1747)  sodium  chloride 0.9 % bolus 1,000 mL (1,000 mLs Intravenous New Bag/Given 03/22/17 1956)     Initial Impression /  Assessment and Plan / ED Course  I have reviewed the triage vital signs and the nursing notes.  Pertinent labs & imaging results that were available during my care of the patient were reviewed by me and considered in my medical decision making (see chart for details).     Patient presenting from PCP with one-week history of dizziness, weakness, and weight loss. Patient presenting with tachycardia, tachypnea, and soft blood pressure. Past medical history include lymphoma, finished chemotherapy 1 month ago. Patient denies pain other than a headache, which she has had for 2 weeks. This headache is mild. Will order CBC, CMP, troponin, EKG, and chest x-ray for further evaluation. While patient states that she was told her urine was clear as morning, we have no results in our system, and will order UA. Will start IV fluids. Discussed case with attending, and Dr. Tamera Punt evaluated the patient.  UA shows positive nitrates and leuks with moderate hemoglobin. CBC shows anemia, which is baseline per patient. White count is normal at 5.5. Creatinine elevated at 2.14, baseline 1.7. Troponin elevated at 0.05, likely due to tachycardia. Will send urine for culture, order lactic and blood cultures and start sepsis protocol. Will consult with hospitalist for admission. Discussed findings with patient, and patient is amenable to admission.   Discussed case with hospitalist, and patient to be admitted.  Critical Care time: 30 Minutes   Final Clinical Impressions(s) / ED Diagnoses   Final diagnoses:  Sepsis, due to unspecified organism [facility])  Pyelonephritis  AKI (acute kidney injury) [facility])    New Prescriptions New Prescriptions   No medications on file     Franchot Heidelberg, PA-C 03/22/17 2213    Malvin Johns, MD 03/22/17 2326

## 2017-03-22 NOTE — Progress Notes (Signed)
Report received. Room ready.  

## 2017-03-22 NOTE — Progress Notes (Addendum)
Chief Complaint  Patient presents with  . Headache    X 4 days    HPI  Pt has been struggling with progressive weakness Wt Readings from Last 3 Encounters:  03/08/17 143 lb 14.4 oz (65.3 kg)  02/19/17 152 lb 1.9 oz (69 kg)  02/01/17 151 lb 14.4 oz (68.9 kg)   She has been having headaches for 2 weeks She has been putting ice on the top of her head The pain is present all the time She tried to take xanax but that makes her very groggy She has taken compazine but that does not help She also takes aspirin for the headache She fell at home this morning but did not lose consciousness or hit her head.  She reports that her legs are getting weaker She is trying to eat For headaches she took aspirin, tylenol and had no improvement  Past Medical History:  Diagnosis Date  . Anemia    SECONDARY TO CHEMOTHERAPY  . Arthritis    Osteoarthritis  . 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 Cancer ([place])    NHL  . Hx of radiation therapy 08/17/13- 09/03/13   right lower extremity- lymphoma  . Hypertension   . S/P knee replacement    Left knee  . Thyroid disease    Hyperthyroidism    Current Outpatient Prescriptions  Medication Sig Dispense Refill  . ALPRAZolam (XANAX) 0.25 MG tablet Take 1 tablet (0.25 mg total) by mouth at bedtime as needed for anxiety. 30 tablet 0  . Cyanocobalamin (VITAMIN B-12 PO) Take 1 tablet by mouth daily.    Marland Kitchen levothyroxine (SYNTHROID, LEVOTHROID) 112 MCG tablet Take 1 tablet daily 90 tablet 3  . metoprolol tartrate (LOPRESSOR) 25 MG tablet Take 0.5 tablets (12.5 mg total) by mouth 2 (two) times daily. 15 tablet 0  . pravastatin (PRAVACHOL) 40 MG tablet Take 1 tablet (40 mg total) by mouth at bedtime. 30 tablet 0  . prochlorperazine (COMPAZINE) 10 MG tablet Take 1 tablet (10 mg total) by mouth every 6 (six) hours as needed for nausea or vomiting. 30 tablet 0   No current facility-administered medications for this visit.     Allergies:    Allergies  Allergen Reactions  . Lisinopril Cough    Past Surgical History:  Procedure Laterality Date  . IR GENERIC HISTORICAL  07/13/2016   IR US GUIDE VASC ACCESS RIGHT 07/13/2016 [facility]  . IR GENERIC HISTORICAL  07/13/2016   IR FLUORO GUIDE PORT INSERTION RIGHT 07/13/2016 [facility]  . NECK LESION BIOPSY Right 05/04/11   node biopsies  . TOTAL KNEE ARTHROPLASTY Left    x 2    Social History   Social History  . Marital status: Married    Spouse name: N/A  . Number of children: N/A  . Years of education: N/A   Social History Main Topics  . Smoking status: Never Smoker  . Smokeless tobacco: Never Used  . Alcohol use No  . Drug use: No  . Sexual activity: Not Asked   Other Topics Concern  . None   Social History Narrative  . None    Review of Systems  Constitutional: Positive for diaphoresis, malaise/fatigue and weight loss. Negative for chills and fever.  Respiratory: Negative for cough and wheezing.   Cardiovascular: Negative for chest pain and palpitations.  Gastrointestinal: Negative for abdominal pain, nausea and vomiting.  Neurological: Positive for dizziness, tremors, weakness and headaches. Negative for seizures and loss of consciousness.  See hpi  Objective: Vitals:   03/22/17 1517  BP: 114/63  Pulse: (!) 116  Resp: 16  Temp: 97.7 F (36.5 C)  TempSrc: Oral  SpO2: 98%    Physical Exam Physical Exam  Constitutional: She is oriented to person, place, and time. She appears cachectic and tremulous HENT:  Head: Normocephalic and atraumatic.  Eyes: Conjunctivae pale, EOM are normal.  Cardiovascular: sinus tachycardia.   Pulmonary/Chest: Effort normal and breath sounds normal. No respiratory distress. She has no wheezes.  Abdominal: Normal appearance and bowel sounds are normal. There is no tenderness. There is no CVA tenderness.  Neurological: She is alert and oriented to person, place, but not time. She appears  tremulous   Assessment and Plan Hurshel Keys was seen today for headache.  Diagnoses and all orders for this visit:  Acute intractable tension-type headache Failure to thrive in adult Cancer [facility]) Dehydration AKI (acute kidney injury) ([place]) Fall in home, initial encounter Acute delirium  Will advised transfer to ED for evaluation for acute deterioration in patient status with evidence of dehydration, tachycardia, weight loss, weakness history of acute kidney injury and a recent fall at home with acute delirium and failure to thrive  Since she has impaired renal function cannot give her toradol Since I cannot get stat labs I am not sure if I can assess her status in terms of hydration presently   A total of 30 minutes were spent face-to-face with the patient during this encounter and over half of that time was spent on counseling and coordination of care.  [place]

## 2017-03-22 NOTE — Progress Notes (Addendum)
Pharmacy Antibiotic Note Kelly Ray is a 76 y.o. female admitted on 03/22/2017 with concern for UTI. Pt with history of ESBL E.coli UTI in past. Pharmacy to dose meropenem.   Plan: 1. Meropenem 500 mg IV every 12 hours    Height: 5\' 2"  (157.5 cm) Weight: 140 lb (63.5 kg) IBW/kg (Calculated) : 50.1  Temp (24hrs), Avg:98.3 F (36.8 C), Min:97.7 F (36.5 C), Max:98.8 F (37.1 C)   Recent Labs Lab 03/22/17 1656  WBC 5.5  CREATININE 2.14*    Estimated Creatinine Clearance: 20.2 mL/min (A) (by C-G formula based on SCr of 2.14 mg/dL (H)).    Allergies  Allergen Reactions  . Lisinopril Cough    Thank you for allowing pharmacy to be a part of this patient's care.  Vincenza Hews, PharmD, BCPS 03/22/2017, 8:04 PM

## 2017-03-22 NOTE — H&P (Signed)
History and Physical    Kelly Ray CWC:[id number] DOB: [birth year] DOA: 03/22/2017  Referring MD/NP/PA:   PCP: Forrest Moron, MD   Patient coming from:  The patient is coming from home.  At baseline, pt is independent for most of ADL.   Chief Complaint: Increased urinary frequency, generalized weakness  HPI: Kelly Ray is a 76 y.o. 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  Patient states that she has been having generalized weakness in the past 2 weeks, which has been progressively getting worse. Denies unilateral weakness, numbness or tingliness in extremities. She states that she felt dizzy sometimes, and lost 10 pounds recently. She also has headache. She reports increased urinary frequency, but no dysuria or burning on urination. Patient does not have chest pain, cough, shortness of breath. No nausea, vomiting, diarrhea or abdominal pain. Patient was seen by PCP today, and was sent to ER for further evaluation and treatment.  ED Course: pt was found to have positive urinalysis with large amount of leukocytes, lactate 0.58, troponin 0.05, worsening renal function, temperature normal, soft blood pressure, tachycardia, tachypnea, oxygen saturation 99% on room air. Patient is admitted to telemetry bed as inpatient.  Review of Systems:   General: no fevers, chills, no changes in body weight, has poor appetite, has fatigue HEENT: no blurry vision, hearing changes or sore throat Respiratory: no dyspnea, coughing, wheezing CV: no chest pain, no palpitations GI: no nausea, vomiting, abdominal pain, diarrhea, constipation GU: no dysuria, burning on urination, has increased urinary frequency, no hematuria  Ext: no leg edema Neuro: no unilateral weakness, numbness, or tingling, no vision change or hearing loss Skin: no  rash, no skin tear. MSK: No muscle spasm, no deformity, no limitation of range of movement in spin Heme: No easy bruising.  Travel history: No recent long distant travel.  Allergy:  Allergies  Allergen Reactions  . Lisinopril Cough    Past Medical History:  Diagnosis Date  . Anemia    SECONDARY TO CHEMOTHERAPY  . Arthritis    Osteoarthritis  . B-cell lymphoma ([facility]) 05/04/11   Large B-cell lymphoma  . Cancer ([place])    NHL  . Hx of radiation therapy 08/17/13- 09/03/13   right lower extremity- lymphoma  . Hypertension   . S/P knee replacement    Left knee  . Thyroid disease    Hyperthyroidism    Past Surgical History:  Procedure Laterality Date  . IR GENERIC HISTORICAL  07/13/2016   IR US GUIDE VASC ACCESS RIGHT 07/13/2016 [facility]  . IR GENERIC HISTORICAL  07/13/2016   IR FLUORO GUIDE PORT INSERTION RIGHT 07/13/2016 [facility]  . NECK LESION BIOPSY Right 05/04/11   node biopsies  . TOTAL KNEE ARTHROPLASTY Left    x 2    Social History:  reports that she has never smoked. She has never used smokeless tobacco. She reports that she does not drink alcohol or use drugs.  Family History:  Family History  Problem Relation Age of Onset  . Cancer Mother        bladder  . Heart attack Father   . Cancer Brother        kidney     Prior to Admission medications   Medication Sig Start Date End Date Taking? Authorizing Provider  acetaminophen (TYLENOL) 325 MG tablet Take 650 mg by mouth every 6 (six) hours as needed for  mild pain.   Yes [provider]  ALPRAZolam (XANAX) 0.25 MG tablet Take 1 tablet (0.25 mg total) by mouth at bedtime as needed for anxiety. 01/17/17  Yes [name], MD  Cyanocobalamin (VITAMIN B-12 PO) Take 1 tablet by mouth daily.   Yes [provider]  levothyroxine (SYNTHROID, LEVOTHROID) 112 MCG tablet Take 1 tablet daily 05/08/16  Yes Daub, Loura Back, MD  metoprolol tartrate (LOPRESSOR) 25 MG tablet Take 0.5 tablets (12.5 mg  total) by mouth 2 (two) times daily. 02/21/17  Yes Orson Eva J, DO  pravastatin (PRAVACHOL) 40 MG tablet Take 1 tablet (40 mg total) by mouth at bedtime. 12/23/16  Yes Rosita Fire, MD  prochlorperazine (COMPAZINE) 10 MG tablet Take 1 tablet (10 mg total) by mouth every 6 (six) hours as needed for nausea or vomiting. 03/15/17  Yes Owens Shark, NP    Physical Exam: Vitals:   03/22/17 1900 03/22/17 1915 03/22/17 1930 03/22/17 1945  BP: 119/67 120/80 92/76 111/75  Pulse: 91 95 93 100  Resp: (!) 23 20 (!) 21 (!) 26  Temp:      TempSrc:      SpO2: 98% 100% 100% 99%  Weight:      Height:       General: Not in acute distress. dry mucus and membrane, pale looking. HEENT:       Eyes: PERRL, EOMI, no scleral icterus.       ENT: No discharge from the ears and nose, no pharynx injection, no tonsillar enlargement.        Neck: No JVD, no bruit, no mass felt. Heme: No neck lymph node enlargement. Cardiac: S1/S2, RRR, No murmurs, No gallops or rubs. Respiratory: No rales, wheezing, rhonchi or rubs. GI: Soft, nondistended, nontender, no rebound pain, no organomegaly, BS present. GU: No hematuria Ext: No pitting leg edema bilaterally. 2+DP/PT pulse bilaterally. Musculoskeletal: No joint deformities, No joint redness or warmth, no limitation of ROM in spin. Skin: No rashes.  Neuro: Alert, oriented X3, cranial nerves II-XII grossly intact, moves all extremities normally.  Psych: Patient is not psychotic, no suicidal or hemocidal ideation.  Labs on Admission: I have personally reviewed following labs and imaging studies  CBC:  Recent Labs Lab 03/22/17 1656  WBC 5.5  NEUTROABS 3.3  HGB 8.8*  HCT 27.6*  MCV 87.9  PLT 625   Basic Metabolic Panel:  Recent Labs Lab 03/22/17 1656  NA 132*  K 4.9  CL 105  CO2 16*  GLUCOSE 111*  BUN 37*  CREATININE 2.14*  CALCIUM 9.3   GFR: Estimated Creatinine Clearance: 20.2 mL/min (A) (by C-G formula based on SCr of 2.14 mg/dL  (H)). Liver Function Tests:  Recent Labs Lab 03/22/17 1656  AST 18  ALT 13*  ALKPHOS 129*  BILITOT 0.8  PROT 6.3*  ALBUMIN 3.1*   No results for input(s): LIPASE, AMYLASE in the last 168 hours. No results for input(s): AMMONIA in the last 168 hours. Coagulation Profile: No results for input(s): INR, PROTIME in the last 168 hours. Cardiac Enzymes:  Recent Labs Lab 03/22/17 1656  TROPONINI 0.05*   BNP (last 3 results) No results for input(s): PROBNP in the last 8760 hours. HbA1C: No results for input(s): HGBA1C in the last 72 hours. CBG: No results for input(s): GLUCAP in the last 168 hours. Lipid Profile: No results for input(s): CHOL, HDL, LDLCALC, TRIG, CHOLHDL, LDLDIRECT in the last 72 hours. Thyroid Function Tests: No results for input(s): TSH, T4TOTAL, FREET4,  T3FREE, THYROIDAB in the last 72 hours. Anemia Panel: No results for input(s): VITAMINB12, FOLATE, FERRITIN, TIBC, IRON, RETICCTPCT in the last 72 hours. Urine analysis:    Component Value Date/Time   COLORURINE YELLOW 03/22/2017 1813   APPEARANCEUR CLOUDY (A) 03/22/2017 1813   LABSPEC 1.012 03/22/2017 1813   LABSPEC 1.010 03/08/2017 1249   PHURINE 6.0 03/22/2017 1813   GLUCOSEU NEGATIVE 03/22/2017 1813   GLUCOSEU Negative 03/08/2017 1249   HGBUR MODERATE (A) 03/22/2017 1813   BILIRUBINUR NEGATIVE 03/22/2017 1813   BILIRUBINUR negative 03/13/2017 1411   BILIRUBINUR Negative 03/08/2017 1249   KETONESUR NEGATIVE 03/22/2017 1813   PROTEINUR 100 (A) 03/22/2017 1813   UROBILINOGEN 0.2 03/13/2017 1411   UROBILINOGEN 0.2 03/08/2017 1249   NITRITE POSITIVE (A) 03/22/2017 1813   LEUKOCYTESUR LARGE (A) 03/22/2017 1813   LEUKOCYTESUR Moderate 03/08/2017 1249   Sepsis Labs: @LABRCNTIP (procalcitonin:4,lacticidven:4) ) Recent Results (from the past 240 hour(s))  Urine Culture     Status: None   Collection Time: 03/13/17  2:20 PM  Result Value Ref Range Status   Urine Culture, Routine Final report  Final    Organism ID, Bacteria No growth  Final     Radiological Exams on Admission: Dg Chest 2 View  Result Date: 03/22/2017 CLINICAL DATA:  Tachycardia. Patient is unable lift the arms for positioning. EXAM: CHEST  2 VIEW COMPARISON:  06/07/2015 FINDINGS: Since the previous study, there is interval placement of a power port type central venous catheter. Tip appears to project over the low SVC region. No pneumothorax. Multiple EKG wires and leads limit evaluation. Heart size and pulmonary vascularity are normal. Lungs appear clear. No blunting of costophrenic angles. No pneumothorax. Degenerative changes in the shoulders. IMPRESSION: No active cardiopulmonary disease. Electronically Signed   By: Lucienne Capers M.D.   On: 03/22/2017 18:42     EKG: Independently reviewed.  Sinus rhythm, QTC 406, tachycardia, LAD, anteroseptal infarction pattern.   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 She meets criteria for sepsis with soft blood pressure, tachycardia and tachypnea. Lactic acid is normal. Hemodynamically stable currently.  - A dmit to telemetry bed as inpt -  Meropenum by IV - Follow up results of urine and blood cx and amend antibiotic regimen if needed per sensitivity results - will get Procalcitonin and trend lactic acid levels per sepsis protocol. - IVF: 2L of NS bolus in ED, followed by 125 cc/h (patient has a congestive heart failure, limiting aggressive IV fluids treatment).  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 Benay Spice. She just finished chemotherapy 1 month  ago. -Follow up with Dr. Benay Spice  HLD: -Pravastatin  HTN: -Continue metoprolol, put holding parameter of SBP  Hypothyroidism: Last TSH was 0.326 on 02/19/17. -Continue home Synthroid  Protein calorie malnutrition-moderate -consult nutrition  Elevated troponin: trop 0.05. No CP. Most likely due to demand ischemia secondary to sepsis. - cycle CE q6 x3 and repeat EKG in the am  - aspirin 81 mg daily - Risk factor stratification: will check FLP and A1C   AoCKD-III: Baseline Cre is 1.5 on  03/08/17, pt's Cre is 2.14 on admission. Likely due to UTI and prerenal secondary to dehydration - IVF as above - Check FeNa  - Follow up renal function by BMP  Normocytic anemia: Due to lymphoma and chemotherapy. Hemoglobin is stable 8.8, which was 9.0 on 03/08/17 -Follow-up by CBC  DVT ppx: SQ Lovenox Code Status: pt was DNR before, but today I discussed with patient about code status, explained the meaning of CODE STATUS, she wants to be full code now.  Family Communication:  Yes, patient's husband at bed side Disposition Plan:  Anticipate discharge back to previous home environment Consults called:  none Admission status:  Inpatient/tele    Date of Service 03/22/2017    Ivor Costa [facility] Pager [phone]  If 7PM-7AM, please contact night-coverage [email or web address] Password [facility] 03/22/2017, 8:41 PM

## 2017-03-22 NOTE — Telephone Encounter (Signed)
Called [place] [facility] and notified them that pt is coming by private vehicle

## 2017-03-22 NOTE — Patient Instructions (Addendum)
IF you received an x-ray today, you will receive an invoice from [facility]. Please contact [facility] at [phone] with questions or concerns regarding your invoice.   IF you received labwork today, you will receive an invoice from [facility]. Please contact [facility] at [phone] with questions or concerns regarding your invoice.   Our billing staff will not be able to assist you with questions regarding bills from these companies.  You will be contacted with the lab results as soon as they are available. The fastest way to get your results is to activate your My Chart account. Instructions are located on the last page of this paperwork. If you have not heard from [place] regarding the results in 2 weeks, please contact this office.    Failure to Thrive, Adult Failure to thrive is a group of symptoms that affect elderly adults. These symptoms include loss of appetite and weight loss. People who have this condition may do fewer and fewer activities over time. They may lose interest in being with friends or they may not want to eat or drink. This condition is not a normal part of aging. What are the causes? This condition may be caused by:  A disease, such dementia, diabetes, cancer, or lung disease.  A health problem, such as a vitamin deficiency or a heart problem.  A disorder, such as depression.  A disability.  Medicines.  Mistreatment or neglect.  In some cases, the cause may not be known. What are the signs or symptoms? Symptoms of this condition include:  Loss of more than 5% of your body weight.  Being more tired than normal after an activity.  Having trouble getting up after sitting.  Loss of appetite.  Not getting out of bed.  Not wanting to do usual activities.  Depression.  Getting infections often.  Bedsores.  Taking a long time to recover after an injury or a surgery.  Weakness.  How is this diagnosed? This condition may be  diagnosed with a physical exam. Your health care provider will ask questions about your health, behavior, and mood, such as:  Has your activity changed?  Do you seem sad?  Are your eating habits different?  Tests may also be done. They may include:  Blood tests.  Urine tests.  Imaging tests, such as X-rays, a CT scan, or MRI.  Hearing tests.  Vision tests.  Tests to check thinking ability (cognitive tests).  Activity tests to see if you can do tasks such as bathing and dressing and to see if you can move around safely.  You may be referred to a specialist. How is this treated? Treatment for this condition depends on the cause. It may involve:  Treating the cause.  Talk therapy or medicine to treat depression.  Improving diet, such as by eating more often or taking nutritional supplements.  Changing or stopping a medicine.  Physical therapy.  It often takes a team of health care providers to find the right treatment. Follow these instructions at home:  Take over-the-counter and prescription medicines only as told by your health care provider.  Eat a healthy, well-balanced diet. Make sure to get enough calories in each meal.  Be physically active. Include strength training as part of your exercise routine. A physical therapist can help to set up an exercise program that fits you.  Make sure that you are safe at home.  Make sure that you have a plan for what to do if you become  unable to make decisions for yourself. Contact a health care provider if:  You are not able to eat well.  You are not able to move around.  You feel very sad or hopeless. Get help right away if:  You have thoughts of ending your life.  You cannot eat or drink.  You do not get out of bed.  Staying at home is no longer safe.  You have a fever. This information is not intended to replace advice given to you by your health care provider. Make sure you discuss any questions you have  with your health care provider. Document Released: 11/05/2011 Document Revised: 01/19/2016 Document Reviewed: 11/08/2014 Elsevier Interactive Patient Education  [employer].

## 2017-03-23 LAB — BASIC METABOLIC PANEL
ANION GAP: 7 (ref 5–15)
BUN: 28 mg/dL — ABNORMAL HIGH (ref 6–20)
CHLORIDE: 113 mmol/L — AB (ref 101–111)
CO2: 15 mmol/L — ABNORMAL LOW (ref 22–32)
Calcium: 7.7 mg/dL — ABNORMAL LOW (ref 8.9–10.3)
Creatinine, Ser: 1.88 mg/dL — ABNORMAL HIGH (ref 0.44–1.00)
GFR calc Af Amer: 29 mL/min — ABNORMAL LOW (ref >60)
GFR calc non Af Amer: 25 mL/min — ABNORMAL LOW (ref >60)
GLUCOSE: 94 mg/dL (ref 65–99)
POTASSIUM: 4.1 mmol/L (ref 3.5–5.1)
SODIUM: 135 mmol/L (ref 135–145)

## 2017-03-23 LAB — LIPID PANEL
CHOL/HDL RATIO: 2.4 ratio
Cholesterol: 100 mg/dL (ref 0–200)
HDL: 41 mg/dL (ref >40)
LDL CALC: 47 mg/dL (ref 0–99)
Triglycerides: 61 mg/dL (ref <150)
VLDL: 12 mg/dL (ref 0–40)

## 2017-03-23 LAB — CBC
HCT: 22.9 % — ABNORMAL LOW (ref 36.0–46.0)
HEMATOCRIT: 21.9 % — AB (ref 36.0–46.0)
HEMOGLOBIN: 7.1 g/dL — AB (ref 12.0–15.0)
Hemoglobin: 7.3 g/dL — ABNORMAL LOW (ref 12.0–15.0)
MCH: 28.7 pg (ref 26.0–34.0)
MCH: 28.4 pg (ref 26.0–34.0)
MCHC: 32.4 g/dL (ref 30.0–36.0)
MCHC: 31.9 g/dL (ref 30.0–36.0)
MCV: 88.7 fL (ref 78.0–100.0)
MCV: 89.1 fL (ref 78.0–100.0)
PLATELETS: 146 10*3/uL — AB (ref 150–400)
Platelets: 144 10*3/uL — ABNORMAL LOW (ref 150–400)
RBC: 2.47 MIL/uL — ABNORMAL LOW (ref 3.87–5.11)
RBC: 2.57 MIL/uL — ABNORMAL LOW (ref 3.87–5.11)
RDW: 19.2 % — ABNORMAL HIGH (ref 11.5–15.5)
RDW: 19.4 % — AB (ref 11.5–15.5)
WBC: 3 10*3/uL — ABNORMAL LOW (ref 4.0–10.5)
WBC: 3.7 10*3/uL — ABNORMAL LOW (ref 4.0–10.5)

## 2017-03-23 LAB — SODIUM, URINE, RANDOM: Sodium, Ur: 81 mmol/L

## 2017-03-23 LAB — PREPARE RBC (CROSSMATCH)

## 2017-03-23 LAB — GLUCOSE, CAPILLARY: Glucose-Capillary: 158 mg/dL — ABNORMAL HIGH (ref 65–99)

## 2017-03-23 LAB — TROPONIN I
Troponin I: 0.05 ng/mL (ref <0.03)
Troponin I: 0.1 ng/mL (ref <0.03)

## 2017-03-23 LAB — IRON AND TIBC
IRON: 9 ug/dL — AB (ref 28–170)
SATURATION RATIOS: 7 % — AB (ref 10.4–31.8)
TIBC: 133 ug/dL — ABNORMAL LOW (ref 250–450)
UIBC: 124 ug/dL

## 2017-03-23 LAB — ABO/RH: ABO/RH(D): A POS

## 2017-03-23 LAB — FERRITIN: FERRITIN: 1424 ng/mL — AB (ref 11–307)

## 2017-03-23 LAB — CREATININE, URINE, RANDOM: CREATININE, URINE: 57.64 mg/dL

## 2017-03-23 MED ORDER — METOPROLOL TARTRATE 25 MG PO TABS
25.0000 mg | ORAL_TABLET | Freq: Two times a day (BID) | ORAL | Status: DC
  Administered 2017-03-23 – 2017-03-24 (×3): 25 mg via ORAL
  Filled 2017-03-23 (×3): qty 1

## 2017-03-23 MED ORDER — SODIUM CHLORIDE 0.9 % IV SOLN
Freq: Once | INTRAVENOUS | Status: AC
  Administered 2017-03-23: 16:00:00 via INTRAVENOUS

## 2017-03-23 MED ORDER — ENSURE ENLIVE PO LIQD
237.0000 mL | Freq: Two times a day (BID) | ORAL | Status: DC
  Administered 2017-03-23 – 2017-03-26 (×5): 237 mL via ORAL

## 2017-03-23 MED ORDER — FERROUS SULFATE 325 (65 FE) MG PO TABS
325.0000 mg | ORAL_TABLET | Freq: Two times a day (BID) | ORAL | Status: DC
Start: 2017-03-23 — End: 2017-03-26
  Administered 2017-03-23 – 2017-03-26 (×6): 325 mg via ORAL
  Filled 2017-03-23 (×6): qty 1

## 2017-03-23 MED ORDER — SODIUM CHLORIDE 0.9 % IV BOLUS (SEPSIS)
500.0000 mL | Freq: Once | INTRAVENOUS | Status: AC
  Administered 2017-03-23: 500 mL via INTRAVENOUS

## 2017-03-23 NOTE — Progress Notes (Signed)
Initial Nutrition Assessment  DOCUMENTATION CODES:   Severe malnutrition in context of chronic illness  INTERVENTION:   -Recommend liberalizing diet to REGULAR  -Increase Ensure Enlive po from BID to TID, each supplement provides 350 kcal and 20 grams of protein. Encourage pt to continue at discharge  -Enourage smaller, more frequent meals; snacking on high calorie/high protein foods  NUTRITION DIAGNOSIS:   Malnutrition (Severe) related to chronic illness, cancer and cancer related treatments as evidenced by percent weight loss, severe depletion of body fat, moderate depletion of body fat.  GOAL:   Patient will meet greater than or equal to 90% of their needs  MONITOR:   PO intake, Supplement acceptance, Labs, Weight trends  REASON FOR ASSESSMENT:   Consult Assessment of nutrition requirement/status  ASSESSMENT:   76 yo female admitted with sepsis due to UTI, AKI on CKD III. Pt with hx of NHL and B-cell lymphoma, HL  Pt ate 1 [employer] pancake for breakfast this AM. Pt reports poor appetite. Pt has been eating minimally at home but drinking 2-3 Boost/Ensure per day.   Weight loss of 10 pounds in 1 month. Pt reports 60 pound wt loss in past 6 months (29% wt loss). Per weight encounters, pt with 14.6% wt loss in 3 months which is significant for time frame. Pt had been receiving chemo during this time frame but is completed  Nutrition-Focused physical exam completed. Findings are mild/moderate fat depletion, mild/moderate to severe muscle depletion, and no edema.   Labs: Creatinine 1.88, BUN 28 Meds: NS at 125 ml/hr  Diet Order:  Diet regular Room service appropriate? Yes; Fluid consistency: Thin  Skin:  Wound (see comment) (stage II buttock)  Last BM:  7/27  Height:   Ht Readings from Last 1 Encounters:  03/22/17 5\' 2"  (1.575 m)    Weight:   Wt Readings from Last 1 Encounters:  03/22/17 146 lb 12.8 oz (66.6 kg)    Ideal Body Weight:     BMI:  Body mass  index is 26.85 kg/m.  Estimated Nutritional Needs:   Kcal:  1450-1650 kcals  Protein:  73-83 g  Fluid:  >/= 1.5 L  EDUCATION NEEDS:   No education needs identified at this time  [place], [place], LDN [phone] Pager  [phone] Weekend/On-Call Pager

## 2017-03-23 NOTE — Progress Notes (Signed)
PROGRESS NOTE    Kelly Ray  [id number]:[id number] DOB: 1941-07-13 DOA: 03/22/2017 PCP: Forrest Moron, MD   Brief Narrative: 76 y.o. 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  Assessment & Plan:  # Sepsis due to urinary tract infection: Patient with history of ESBL UTI. Continue IV meropenem. Patient was low-grade fever. Follow up culture results.  # Pancytopenia/iron deficiency anemia: Patient with hemoglobin of 7.1 and reported generalized weakness. She is pale on physical exam. Repeat in CBC today. Plan to transfuse 2 units of red blood cells for symptomatic anemia. Patient denied no sign of bleeding. Patient with history of lymphoma. Recommended outpatient follow-up with oncologist.  #Hyperlipidemia: Continue statin  # Hypertension: Continue metoprolol. Monitor blood pressure closely.  #Hypothyroidism: Continue Synthroid  #Moderate protein calorie malnutrition: Dietary consult. Change to regular diet.  #Mild elevation in troponin likely demand ischemia secondary due to sepsis and anemia: Patient has no chest pain. Continue aspirin. Patient had echo on January 2018 with EF of 45-60%.  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 Monitor BMP.  #Physical deconditioning: PT would evaluation. Continue supportive care.  #Diffuse large B-cell lymphoma: Recommended outpatient follow-up with oncologist Dr. Learta Codding.  DVT prophylaxis: Discontinue Lovenox because of pancytopenia. Order SCD Code Status: Full code Family Communication: No family at bedside Disposition Plan: Likely discharge home in 1-2 days    Consultants:   None  Procedures: None Antimicrobials: IV meropenem since July 27  Subjective: Seen and examined at bedside. Reported  generalized weakness fatigue. Denied nausea vomiting chest pain. Has mild shortness of breath.  Objective: Vitals:   03/22/17 2030 03/22/17 2045 03/22/17 2136 03/23/17 0542  BP: 109/75 111/82 114/80 126/68  Pulse: 98 96 (!) 107 (!) 146  Resp: 19 19 19 18   Temp:   97.8 F (36.6 C) (!) 100.9 F (38.3 C)  TempSrc:   Oral Oral  SpO2: 99% 100% 100% 99%  Weight:   66.6 kg (146 lb 12.8 oz)   Height:   5\' 2"  (1.575 m)     Intake/Output Summary (Last 24 hours) at 03/23/17 1355 Last data filed at 03/23/17 0600  Gross per 24 hour  Intake          2717.91 ml  Output              900 ml  Net          1817.91 ml   Filed Weights   03/22/17 1646 03/22/17 2136  Weight: 63.5 kg (140 lb) 66.6 kg (146 lb 12.8 oz)    Examination:  General exam:Pale elderly female lying in bed comfortable and comfortable Pulmonary: Clear bilaterally, no wheezing or crackle  Cardiovascular system: S1 & S2 heard, RRR.  No pedal edema. Gastrointestinal system: Abdomen is nondistended, soft and nontender. Normal bowel sounds heard. Central nervous system: Alert and oriented. No focal neurological deficits. Extremities: Symmetric 5 x 5 power. Skin: No rashes, lesions or ulcers Psychiatry: Judgement and insight appear normal. Mood & affect appropriate.     Data Reviewed: I have personally reviewed following labs and imaging studies  CBC:  Recent Labs Lab 03/22/17 1656 03/23/17 0226 03/23/17 0844  WBC 5.5 3.0* 3.7*  NEUTROABS 3.3  --   --   HGB 8.8* 7.1* 7.3*  HCT 27.6* 21.9* 22.9*  MCV 87.9 88.7 89.1  PLT 196 144*  275*   Basic Metabolic Panel:  Recent Labs Lab 03/22/17 1656 03/23/17 0226  NA 132* 135  K 4.9 4.1  CL 105 113*  CO2 16* 15*  GLUCOSE 111* 94  BUN 37* 28*  CREATININE 2.14* 1.88*  CALCIUM 9.3 7.7*   GFR: Estimated Creatinine Clearance: 23.5 mL/min (A) (by C-G formula based on SCr of 1.88 mg/dL (H)). Liver Function Tests:  Recent Labs Lab 03/22/17 1656  AST 18  ALT 13*    ALKPHOS 129*  BILITOT 0.8  PROT 6.3*  ALBUMIN 3.1*   No results for input(s): LIPASE, AMYLASE in the last 168 hours. No results for input(s): AMMONIA in the last 168 hours. Coagulation Profile: No results for input(s): INR, PROTIME in the last 168 hours. Cardiac Enzymes:  Recent Labs Lab 03/22/17 1656 03/22/17 2136 03/23/17 0226 03/23/17 0844  TROPONINI 0.05* <0.03 0.05* 0.10*   BNP (last 3 results) No results for input(s): PROBNP in the last 8760 hours. HbA1C: No results for input(s): HGBA1C in the last 72 hours. CBG: No results for input(s): GLUCAP in the last 168 hours. Lipid Profile:  Recent Labs  03/23/17 0226  CHOL 100  HDL 41  LDLCALC 47  TRIG 61  CHOLHDL 2.4   Thyroid Function Tests: No results for input(s): TSH, T4TOTAL, FREET4, T3FREE, THYROIDAB in the last 72 hours. Anemia Panel:  Recent Labs  03/23/17 0844  FERRITIN 1,424*  TIBC 133*  IRON 9*   Sepsis Labs:  Recent Labs Lab 03/22/17 1959 03/22/17 2136  PROCALCITON  --  0.12  LATICACIDVEN 0.58 0.8    Recent Results (from the past 240 hour(s))  Urine Culture     Status: None   Collection Time: 03/13/17  2:20 PM  Result Value Ref Range Status   Urine Culture, Routine Final report  Final   Organism ID, Bacteria No growth  Final  Blood Culture (routine x 2)     Status: None (Preliminary result)   Collection Time: 03/22/17  8:05 PM  Result Value Ref Range Status   Specimen Description BLOOD LEFT ARM  Final   Special Requests   Final    BOTTLES DRAWN AEROBIC ONLY Blood Culture adequate volume   Culture NO GROWTH < 24 HOURS  Final   Report Status PENDING  Incomplete  Blood Culture (routine x 2)     Status: None (Preliminary result)   Collection Time: 03/22/17  8:17 PM  Result Value Ref Range Status   Specimen Description BLOOD RIGHT HAND  Final   Special Requests IN PEDIATRIC BOTTLE Blood Culture adequate volume  Final   Culture NO GROWTH < 24 HOURS  Final   Report Status PENDING   Incomplete         Radiology Studies: Dg Chest 2 View  Result Date: 03/22/2017 CLINICAL DATA:  Tachycardia. Patient is unable lift the arms for positioning. EXAM: CHEST  2 VIEW COMPARISON:  06/07/2015 FINDINGS: Since the previous study, there is interval placement of a power port type central venous catheter. Tip appears to project over the low SVC region. No pneumothorax. Multiple EKG wires and leads limit evaluation. Heart size and pulmonary vascularity are normal. Lungs appear clear. No blunting of costophrenic angles. No pneumothorax. Degenerative changes in the shoulders. IMPRESSION: No active cardiopulmonary disease. Electronically Signed   By: Lucienne Capers M.D.   On: 03/22/2017 18:42        Scheduled Meds: . aspirin EC  81 mg Oral Daily  . enoxaparin (LOVENOX) injection  30 mg Subcutaneous  Q24H  . feeding supplement (ENSURE ENLIVE)  237 mL Oral BID BM  . levothyroxine  112 mcg Oral QAC breakfast  . metoprolol tartrate  25 mg Oral BID  . pravastatin  40 mg Oral QHS  . vitamin B-12  100 mcg Oral Daily   Continuous Infusions: . sodium chloride 125 mL/hr at 03/23/17 0215  . sodium chloride    . meropenem (MERREM) IV Stopped (03/23/17 1029)     LOS: 1 day    Dron Tanna Furry, MD [facility] Pager [phone]  If 7PM-7AM, please contact night-coverage [email or web address] Password [email or web address] 03/23/2017, 1:55 PM

## 2017-03-23 NOTE — Progress Notes (Signed)
Placed patient on the telemetry.

## 2017-03-23 NOTE — Progress Notes (Signed)
New Admission Note:  Arrival Method: Stretcher  Mental Orientation: Alert and oriented x 3-4  Telemetry: n/A Assessment: Completed Skin: Warm and dry. MSAD buttocks stage 2 Lbuttock IV: NSL Pain: Denies  Tubes: N/A Safety Measures: Safety Fall Prevention Plan was given, discussed and signed. Admission: Completed [address] Orientation: Patient has been orientated to the room, unit and the staff. Family: Husband  Orders have been reviewed and implemented. Will continue to monitor the patient. Call light has been placed within reach and bed alarm has been activated.   Sima Matas BSN, RN  Phone Number: [phone]

## 2017-03-23 NOTE — Progress Notes (Signed)
Pt resting in bed. BP 85/50. Dr. Myna Hidalgo notified. Orders received.

## 2017-03-24 ENCOUNTER — Encounter (HOSPITAL_COMMUNITY): Payer: Self-pay | Admitting: *Deleted

## 2017-03-24 ENCOUNTER — Inpatient Hospital Stay (HOSPITAL_COMMUNITY): Payer: Medicare Other

## 2017-03-24 LAB — CBC
HCT: 26.7 % — ABNORMAL LOW (ref 36.0–46.0)
HEMOGLOBIN: 8.8 g/dL — AB (ref 12.0–15.0)
MCH: 28.8 pg (ref 26.0–34.0)
MCHC: 33 g/dL (ref 30.0–36.0)
MCV: 87.3 fL (ref 78.0–100.0)
Platelets: 123 10*3/uL — ABNORMAL LOW (ref 150–400)
RBC: 3.06 MIL/uL — AB (ref 3.87–5.11)
RDW: 18.3 % — ABNORMAL HIGH (ref 11.5–15.5)
WBC: 5.5 10*3/uL (ref 4.0–10.5)

## 2017-03-24 LAB — TYPE AND SCREEN
ABO/RH(D): A POS
ANTIBODY SCREEN: NEGATIVE
UNIT DIVISION: 0
Unit division: 0

## 2017-03-24 LAB — BASIC METABOLIC PANEL
ANION GAP: 7 (ref 5–15)
BUN: 33 mg/dL — AB (ref 6–20)
CHLORIDE: 114 mmol/L — AB (ref 101–111)
CO2: 15 mmol/L — ABNORMAL LOW (ref 22–32)
Calcium: 7.6 mg/dL — ABNORMAL LOW (ref 8.9–10.3)
Creatinine, Ser: 2.11 mg/dL — ABNORMAL HIGH (ref 0.44–1.00)
GFR calc Af Amer: 25 mL/min — ABNORMAL LOW (ref >60)
GFR, EST NON AFRICAN AMERICAN: 22 mL/min — AB (ref >60)
GLUCOSE: 76 mg/dL (ref 65–99)
POTASSIUM: 4.1 mmol/L (ref 3.5–5.1)
Sodium: 136 mmol/L (ref 135–145)

## 2017-03-24 LAB — BPAM RBC
BLOOD PRODUCT EXPIRATION DATE: 201808142359
BLOOD PRODUCT EXPIRATION DATE: 201808142359
ISSUE DATE / TIME: 201807281535
ISSUE DATE / TIME: 201807281816
UNIT TYPE AND RH: 6200
Unit Type and Rh: 6200

## 2017-03-24 NOTE — Evaluation (Signed)
Physical Therapy Evaluation Patient Details Name: Kelly Ray MRN: [id number] DOB: [birth year] Today's Date: 03/24/2017   History of Present Illness  Patient is a 76 y/o female who presents with weakness. Found to have hyperkalemia of 7.4 in the setting of acute renal failure and hypotension 70s/50s. EKG-peaked T-waves.  PMH includes large B cell lymphoma, HTN, thyroid disease.  Clinical Impression  Pt presented awake and alert supine in bed, unaware she was on a bed-pan. Pt needed Mod Assist x2 for all transfers with Filiberto Pinks from bed to chair to support "buckling knees". Pt showed left unilateral weakness in UE and left lateral and anterior lean. Unable to correct without physical assistance and padding. Discussed unilateral weakness with OT and RN. Considered calling code stroke, but husband mentioned having "trouble with left arm" for a month. Uncertain if acute neuro event or complications from recent chemo. Paged Dr. Carolin Sicks with concerns. Pt would benefit from continued skilled PT to increase overall muscle strength and to address postural/balance issues.     Follow Up Recommendations CIR Discussed rehab options with pt and husband. Husband prefers her to stay here at [facility] for post-acute rehab.    Equipment Recommendations  Other (comment) (pretty well-equipped)    Recommendations for Other Services Speech consult;OT consult;Rehab consult     Precautions / Restrictions Precautions Precautions: Fall      Mobility  Bed Mobility Overal bed mobility: Needs Assistance Bed Mobility: Sidelying to Sit   Sidelying to sit: Mod assist       General bed mobility comments: L lateral lean when sidelying to sit  Transfers Overall transfer level: Needs assistance   Transfers: Sit to/from Stand Sit to Stand: Mod assist;+2 physical assistance;+2 safety/equipment         General transfer comment: unable to complete stand pivot due to "knee buckling". Could complete transfer  using Filiberto Pinks. Cues for stable sitting in [facility]. L lateral lean remained present.  Ambulation/Gait                Stairs            Wheelchair Mobility    Modified Rankin (Stroke Patients Only)       Balance Overall balance assessment: Needs assistance;History of Falls Sitting-balance support: Bilateral upper extremity supported;Feet supported Sitting balance-Leahy Scale: Poor Sitting balance - Comments: Left lateral lean and anterior lean Postural control: Left lateral lean Standing balance support: Bilateral upper extremity supported Standing balance-Leahy Scale: Poor Standing balance comment: Needed Sara Steady for standing balance                              Pertinent Vitals/Pain Pain Assessment: No/denies pain    Home Living Family/patient expects to be discharged to:: Private residence Living Arrangements: Spouse/significant other Available Help at Discharge: Available 24 hours/day Type of Home: House Home Access: Ramped entrance     Home Layout: One level Home Equipment: Environmental consultant - 2 wheels;Shower seat - built in;Grab bars - toilet;Grab bars - tub/shower;Bedside commode;Shower seat;Wheelchair - manual Additional Comments: reports recent falls    Prior Function Level of Independence: Independent with assistive device(s)         Comments: states she was haivng HHPT     Hand Dominance   Dominant Hand: Right    Extremity/Trunk Assessment   Upper Extremity Assessment Upper Extremity Assessment: Generalized weakness;LUE deficits/detail LUE Deficits / Details: Weakness in lifting LUE to same level as RUE  Lower Extremity Assessment Lower Extremity Assessment: Generalized weakness       Communication   Communication: Other (comment) (Pt/spouse reported slurred speech due to sleeping pill)  Cognition Arousal/Alertness: Awake/alert   Overall Cognitive Status: Within Functional Limits for tasks assessed; pt unaware of  being on bed-pan when PT entered room                                        General Comments    Left unilateral weakness in UE and left lateral trunk lean when seated. Husband reported it's been present for about a month, but no report of this in H/P. Contacted MD for head imaging for possible acute stroke to be safe.   Husband worried she is taking sleeping medications that are not needed and is causing lethargy and weakness.     Exercises     Assessment/Plan    PT Assessment Patient needs continued PT services  PT Problem List Decreased strength;Decreased activity tolerance;Decreased balance;Decreased mobility;Decreased coordination;Decreased cognition;Decreased knowledge of use of DME;Decreased safety awareness;Decreased knowledge of precautions;Impaired sensation       PT Treatment Interventions DME instruction;Gait training;Functional mobility training;Therapeutic activities;Therapeutic exercise;Balance training;Neuromuscular re-education;Cognitive remediation;Patient/family education;Wheelchair mobility training    PT Goals (Current goals can be found in the Care Plan section)  Acute Rehab PT Goals Patient Stated Goal: to get stronger PT Goal Formulation: With patient/family Time For Goal Achievement: 04/07/17 Potential to Achieve Goals: Fair    Frequency Min 3X/week   Barriers to discharge        Co-evaluation               AM-PAC PT "6 Clicks" Daily Activity  Outcome Measure Difficulty turning over in bed (including adjusting bedclothes, sheets and blankets)?: Total Difficulty moving from lying on back to sitting on the side of the bed? : Total Difficulty sitting down on and standing up from a chair with arms (e.g., wheelchair, bedside commode, etc,.)?: Total Help needed moving to and from a bed to chair (including a wheelchair)?: A Lot Help needed walking in hospital room?: Total Help needed climbing 3-5 steps with a railing? : Total 6 Click  Score: 7    End of Session Equipment Utilized During Treatment: Gait belt;Other (comment) Clarise Cruz Steady) Activity Tolerance: No increased pain;Patient limited by lethargy Patient left: in chair;with call bell/phone within reach;with family/visitor present;with chair alarm set;Other (comment) (Eating lunch) Nurse Communication: Need for lift equipment;Other (comment) (Unilateral weakness and potential need for head imaging) PT Visit Diagnosis: Other abnormalities of gait and mobility (R26.89);History of falling (Z91.81);Muscle weakness (generalized) (M62.81);Other symptoms and signs involving the nervous system (R29.898)    Time: 6834-1962 PT Time Calculation (min) (ACUTE ONLY): 27 min   Charges:   PT Evaluation $PT Eval Moderate Complexity: 1 Procedure PT Treatments $Therapeutic Activity: 8-22 mins   PT G Codes:        Shella Maxim, SPT Acute Rehabilitation Services Office: [phone]   Roderic Palau 03/24/2017, 4:56 PM   I was present during the PT session and agree with patient status and findings as outlined by Cristie Hem, SPT.  Roney Marion, [place]  Acute Rehabilitation Services Pager [phone] Office [phone]

## 2017-03-24 NOTE — Progress Notes (Addendum)
PROGRESS NOTE    Kelly Ray  JAS:[id number] DOB: [birth year] DOA: 03/22/2017 PCP: Forrest Moron, MD   Brief Narrative: 76 y.o. 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  Assessment & Plan:  # Sepsis due to urinary tract infection: Patient with history of ESBL UTI. Continue IV meropenem. She was afebrile last night. Follow-up urine culture and blood culture result.  # Pancytopenia/iron deficiency anemia: Received 2 units of red blood cell transfusion yesterday. WBC count improved. Monitor CBC, no sign of bleeding. Monitor platelet counts. Patient with history of lymphoma. Recommended outpatient follow-up with oncologist.  #Hyperlipidemia: Continue statin  # Hypertension: Noted episode of hypotension yesterday. Blood pressure better now. Continue metoprolol as tolerated. Monitor blood pressure closely.  #Hypothyroidism: Continue Synthroid  #Moderate protein calorie malnutrition: Dietary consult. Change to regular diet.  #Mild elevation in troponin likely demand ischemia secondary due to sepsis and anemia: Patient has no chest pain. Continue aspirin. I will order echocardiogram to evaluate the wall motion.  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 Monitor BMP. Avoid nephrotoxins.  #Physical deconditioning/generalized weakness and mostly weak on left side since starting chemo: PT would evaluation. Continue supportive care. -check CT head  #Diffuse large B-cell lymphoma: Recommended outpatient follow-up with oncologist Dr. Learta Codding.  DVT prophylaxis: Discontinue Lovenox because of pancytopenia. Order SCD Code Status: Full code Family Communication: Discussed with the patient's  husband over the phone. Disposition Plan: Likely discharge home in 1-2 days    Consultants:   None  Procedures: None Antimicrobials: IV meropenem since July 27  Subjective: Seen and examined at bedside. Feeling better today. Shortness of breath is better. Denied headache, dizziness, nausea vomiting chest pain shortness of breath. Objective: Vitals:   03/23/17 2100 03/23/17 2304 03/24/17 0049 03/24/17 0702  BP: 111/62 (!) 85/50 108/60 115/60  Pulse: 85 75 74 80  Resp: 16 17 16 16   Temp: 41.9 F (36.8 C) 98 F (36.7 C)  99.1 F (37.3 C)  TempSrc: Oral Oral  Oral  SpO2:  99%  97%  Weight:  72.1 kg (159 lb)    Height:        Intake/Output Summary (Last 24 hours) at 03/24/17 1236 Last data filed at 03/24/17 0705  Gross per 24 hour  Intake          1637.92 ml  Output              550 ml  Net          1087.92 ml   Filed Weights   03/22/17 1646 03/22/17 2136 03/23/17 2304  Weight: 63.5 kg (140 lb) 66.6 kg (146 lb 12.8 oz) 72.1 kg (159 lb)    Examination:  General exam:Not in distress Pulmonary: Clear bilateral, no wheezing or crackle  Cardiovascular system: Regular rate rhythm S1-S2 normal. No pedal edema. Gastrointestinal system: Abdomen soft, nontender, nondistended. [place]ousand positive Central nervous system: Alert and oriented. No focal neurological deficits. Skin: No rashes, lesions or ulcers Psychiatry: Judgement and insight appear normal. Mood & affect appropriate.     Data Reviewed: I have personally reviewed following labs and imaging studies  CBC:  Recent Labs Lab 03/22/17 1656 03/23/17 0226 03/23/17 0844 03/24/17 0623  WBC 5.5 3.0* 3.7* 5.5  NEUTROABS 3.3  --   --   --  HGB 8.8* 7.1* 7.3* 8.8*  HCT 27.6* 21.9* 22.9* 26.7*  MCV 87.9 88.7 89.1 87.3  PLT 196 144* 146* 222*   Basic Metabolic Panel:  Recent Labs Lab 03/22/17 1656 03/23/17 0226 03/24/17 0623  NA 132* 135 136  K 4.9 4.1 4.1  CL 105 113* 114*  CO2 16* 15* 15*  GLUCOSE 111*  94 76  BUN 37* 28* 33*  CREATININE 2.14* 1.88* 2.11*  CALCIUM 9.3 7.7* 7.6*   GFR: Estimated Creatinine Clearance: 21.8 mL/min (A) (by C-G formula based on SCr of 2.11 mg/dL (H)). Liver Function Tests:  Recent Labs Lab 03/22/17 1656  AST 18  ALT 13*  ALKPHOS 129*  BILITOT 0.8  PROT 6.3*  ALBUMIN 3.1*   No results for input(s): LIPASE, AMYLASE in the last 168 hours. No results for input(s): AMMONIA in the last 168 hours. Coagulation Profile: No results for input(s): INR, PROTIME in the last 168 hours. Cardiac Enzymes:  Recent Labs Lab 03/22/17 1656 03/22/17 2136 03/23/17 0226 03/23/17 0844  TROPONINI 0.05* <0.03 0.05* 0.10*   BNP (last 3 results) No results for input(s): PROBNP in the last 8760 hours. HbA1C: No results for input(s): HGBA1C in the last 72 hours. CBG:  Recent Labs Lab 03/23/17 1653  GLUCAP 158*   Lipid Profile:  Recent Labs  03/23/17 0226  CHOL 100  HDL 41  LDLCALC 47  TRIG 61  CHOLHDL 2.4   Thyroid Function Tests: No results for input(s): TSH, T4TOTAL, FREET4, T3FREE, THYROIDAB in the last 72 hours. Anemia Panel:  Recent Labs  03/23/17 0844  FERRITIN 1,424*  TIBC 133*  IRON 9*   Sepsis Labs:  Recent Labs Lab 03/22/17 1959 03/22/17 2136  PROCALCITON  --  0.12  LATICACIDVEN 0.58 0.8    Recent Results (from the past 240 hour(s))  Urine culture     Status: None (Preliminary result)   Collection Time: 03/22/17  6:13 PM  Result Value Ref Range Status   Specimen Description URINE, RANDOM  Final   Special Requests NONE  Final   Culture CULTURE REINCUBATED FOR BETTER GROWTH  Final   Report Status PENDING  Incomplete  Blood Culture (routine x 2)     Status: None (Preliminary result)   Collection Time: 03/22/17  8:05 PM  Result Value Ref Range Status   Specimen Description BLOOD LEFT ARM  Final   Special Requests   Final    BOTTLES DRAWN AEROBIC ONLY Blood Culture adequate volume   Culture NO GROWTH < 24 HOURS  Final    Report Status PENDING  Incomplete  Blood Culture (routine x 2)     Status: None (Preliminary result)   Collection Time: 03/22/17  8:17 PM  Result Value Ref Range Status   Specimen Description BLOOD RIGHT HAND  Final   Special Requests IN PEDIATRIC BOTTLE Blood Culture adequate volume  Final   Culture NO GROWTH < 24 HOURS  Final   Report Status PENDING  Incomplete         Radiology Studies: Dg Chest 2 View  Result Date: 03/22/2017 CLINICAL DATA:  Tachycardia. Patient is unable lift the arms for positioning. EXAM: CHEST  2 VIEW COMPARISON:  06/07/2015 FINDINGS: Since the previous study, there is interval placement of a power port type central venous catheter. Tip appears to project over the low SVC region. No pneumothorax. Multiple EKG wires and leads limit evaluation. Heart size and pulmonary vascularity are normal. Lungs appear clear. No blunting of costophrenic angles. No pneumothorax. Degenerative changes  in the shoulders. IMPRESSION: No active cardiopulmonary disease. Electronically Signed   By: Lucienne Capers M.D.   On: 03/22/2017 18:42        Scheduled Meds: . aspirin EC  81 mg Oral Daily  . feeding supplement (ENSURE ENLIVE)  237 mL Oral BID BM  . ferrous sulfate  325 mg Oral BID WC  . levothyroxine  112 mcg Oral QAC breakfast  . metoprolol tartrate  25 mg Oral BID  . pravastatin  40 mg Oral QHS  . vitamin B-12  100 mcg Oral Daily   Continuous Infusions: . sodium chloride 125 mL/hr at 03/24/17 1202  . meropenem (MERREM) IV 500 mg (03/24/17 1203)     LOS: 2 days    Sophi Calligan Tanna Furry, MD [facility] Pager [phone]  If 7PM-7AM, please contact night-coverage [email or web address] Password [facility] 03/24/2017, 12:36 PM

## 2017-03-24 NOTE — Progress Notes (Addendum)
Occupational Therapy Evaluation Patient Details Name: Kelly Ray MRN: [id number] DOB: 05/18/1941 Today's Date: 03/24/2017    History of Present Illness Patient is a 76 y/o female who presents with weakness. Pt with Sepsis from UTI. Found to have hyperkalemia of 7.4 in the setting of acute renal failure and hypotension 70s/50s. EKG-peaked T-waves.  PMH includes large B cell lymphoma, HTN, thyroid disease.   Clinical Impression   PTA, pt living at home with husband and was modified independent with mobility and ADL @ RW level. Pt/husband state that pt was becoming weaker PTA and had a "fall" due to leg weakness. Feel pt would benefit from rehab at [facility] to facilitate safe DC home with supportive husband. Husband prefers pt DC home after rehab. Will follow acutely to address established goals and facilitate safe DC to next venue of care.     Follow Up Recommendations  [facility]IR;Supervision/Assistance - 24 hour    Equipment Recommendations  Other (comment) (TBA)    Recommendations for Other Services       Precautions / Restrictions Precautions Precautions: Fall      Mobility Bed Mobility Overal bed mobility: Needs Assistance Bed Mobility: Supine to Sit;Sit to Supine     Supine to sit: Mod assist Sit to supine: Mod assist      Transfers Overall transfer level: Needs assistance   Transfers: Sit to/from Stand Sit to Stand: Mod assist         General transfer comment: unable to complete stand pivot. Legs "giving away" and pt returned to bed    Balance Overall balance assessment: Needs assistance;History of Falls   Sitting balance-Leahy Scale: Poor Sitting balance - Comments: anterior lean     Standing balance-Leahy Scale: Poor                             ADL either performed or assessed with clinical judgement   ADL Overall ADL's : Needs assistance/impaired     Grooming: Set up;Sitting   Upper Body Bathing: Set up;Supervision/ safety;Bed level    Lower Body Bathing: Moderate assistance;Sit to/from stand   Upper Body Dressing : Minimal assistance;Sitting   Lower Body Dressing: Moderate assistance;Sit to/from Health and safety inspector Details (indicate cue type and reason): unable to complete due to weakness and feeling dizzy Toileting- Clothing Manipulation and Hygiene: Maximal assistance       Functional mobility during ADLs: Moderate assistance (limited to sit - stand) General ADL Comments: significant functional decline     Vision Baseline Vision/History: Wears glasses       Perception     Praxis      Pertinent Vitals/Pain Pain Assessment: No/denies pain     Hand Dominance Right   Extremity/Trunk Assessment Upper Extremity Assessment Upper Extremity Assessment: Generalized weakness   Lower Extremity Assessment Lower Extremity Assessment: Defer to PT evaluation   Cervical / Trunk Assessment Cervical / Trunk Assessment: Kyphotic   Communication Communication Communication: No difficulties   Cognition Arousal/Alertness: Awake/alert Behavior During Therapy: Flat affect Overall Cognitive Status: No family/caregiver present to determine baseline cognitive functioning Area of Impairment: Attention;Memory;Safety/judgement;Awareness                   Current Attention Level: Selective Memory: Decreased short-term memory   Safety/Judgement: Decreased awareness of safety;Decreased awareness of deficits Awareness: Emergent       General Comments       Exercises     Shoulder Instructions  Home Living Family/patient expects to be discharged to:: Private residence Living Arrangements: Spouse/significant other Available Help at Discharge: Available 24 hours/day Type of Home: House Home Access: [place]: One level     Bathroom Shower/Tub: Occupational psychologist: [place] Accessibility: Yes How Accessible: Accessible via walker [place]: [place] - 2 wheels;Shower seat - built in;Grab bars - toilet;Grab bars - tub/shower;Bedside commode;Shower seat;Wheelchair - manual   Additional Comments: reports recent falls      Prior Functioning/Environment Level of Independence: Independent with assistive device(s)        Comments: states she was haivng HHPT        OT Problem List: Decreased strength;Decreased activity tolerance;Impaired balance (sitting and/or standing);Decreased safety awareness;Decreased knowledge of use of DME or AE;Obesity      OT Treatment/Interventions: Self-care/ADL training;Therapeutic exercise;Energy conservation;DME and/or AE instruction;Therapeutic activities;Cognitive remediation/compensation;Patient/family education;Balance training    OT Goals(Current goals can be found in the care plan section) Acute Rehab OT Goals Patient Stated Goal: to get stronger OT Goal Formulation: With patient Time For Goal Achievement: 04/07/17 Potential to Achieve Goals: Good ADL Goals Pt Will Perform Upper Body Bathing: with set-up;sitting Pt Will Perform Lower Body Bathing: with min guard assist;sit to/from stand Pt Will Perform Upper Body Dressing: with set-up;sitting Pt Will Perform Lower Body Dressing: with min guard assist;sit to/from stand Pt Will Transfer to Toilet: with min guard assist;bedside commode;stand pivot transfer  OT Frequency: Min 2X/week   Barriers to D/C:            Co-evaluation              AM-PAC PT "6 Clicks" Daily Activity     Outcome Measure Help from another person eating meals?: None Help from another person taking care of personal grooming?: A Little Help from another person toileting, which includes using toliet, bedpan, or urinal?: A Lot Help from another person bathing (including washing, rinsing, drying)?: A Lot Help from another person to put on and taking off regular upper body clothing?: A Little Help from another person to put on and taking off regular  lower body clothing?: A Lot 6 Click Score: 16   End of Session Equipment Utilized During Treatment: Gait belt Nurse Communication: Mobility status;Need for lift equipment (use wide Steady)  Activity Tolerance: Patient tolerated treatment well Patient left: in bed;with call bell/phone within reach;with family/visitor present;with nursing/sitter in room  OT Visit Diagnosis: Unsteadiness on feet (R26.81);Muscle weakness (generalized) (M62.81);History of falling (Z91.81);Other symptoms and signs involving cognitive function;Dizziness and giddiness (R42)                Time: 5038-8828 OT Time Calculation (min): 19 min Charges:  OT General Charges $OT Visit: 1 Procedure OT Evaluation $OT Eval Moderate Complexity: 1 Procedure G-Codes:     [place], OT/L  [phone] 03/24/2017  Naelle Diegel,HILLARY 03/24/2017, 11:53 AM

## 2017-03-25 ENCOUNTER — Ambulatory Visit: Payer: Medicare Other | Admitting: Nurse Practitioner

## 2017-03-25 ENCOUNTER — Inpatient Hospital Stay (HOSPITAL_COMMUNITY): Payer: Medicare Other

## 2017-03-25 ENCOUNTER — Other Ambulatory Visit: Payer: Medicare Other

## 2017-03-25 DIAGNOSIS — E039 Hypothyroidism, unspecified: Secondary | ICD-10-CM

## 2017-03-25 DIAGNOSIS — D638 Anemia in other chronic diseases classified elsewhere: Secondary | ICD-10-CM

## 2017-03-25 DIAGNOSIS — N183 Chronic kidney disease, stage 3 unspecified: Secondary | ICD-10-CM

## 2017-03-25 DIAGNOSIS — T451X5A Adverse effect of antineoplastic and immunosuppressive drugs, initial encounter: Secondary | ICD-10-CM

## 2017-03-25 DIAGNOSIS — G620 Drug-induced polyneuropathy: Secondary | ICD-10-CM

## 2017-03-25 DIAGNOSIS — N179 Acute kidney failure, unspecified: Secondary | ICD-10-CM

## 2017-03-25 DIAGNOSIS — D62 Acute posthemorrhagic anemia: Secondary | ICD-10-CM

## 2017-03-25 DIAGNOSIS — C8335 Diffuse large B-cell lymphoma, lymph nodes of inguinal region and lower limb: Secondary | ICD-10-CM

## 2017-03-25 DIAGNOSIS — N10 Acute tubulo-interstitial nephritis: Secondary | ICD-10-CM

## 2017-03-25 DIAGNOSIS — N390 Urinary tract infection, site not specified: Secondary | ICD-10-CM

## 2017-03-25 DIAGNOSIS — I340 Nonrheumatic mitral (valve) insufficiency: Secondary | ICD-10-CM

## 2017-03-25 DIAGNOSIS — I10 Essential (primary) hypertension: Secondary | ICD-10-CM

## 2017-03-25 LAB — MAGNESIUM: MAGNESIUM: 1.5 mg/dL — AB (ref 1.7–2.4)

## 2017-03-25 LAB — CBC
HEMATOCRIT: 28 % — AB (ref 36.0–46.0)
HEMOGLOBIN: 9.3 g/dL — AB (ref 12.0–15.0)
MCH: 29.2 pg (ref 26.0–34.0)
MCHC: 33.2 g/dL (ref 30.0–36.0)
MCV: 88.1 fL (ref 78.0–100.0)
Platelets: 124 10*3/uL — ABNORMAL LOW (ref 150–400)
RBC: 3.18 MIL/uL — ABNORMAL LOW (ref 3.87–5.11)
RDW: 18.8 % — AB (ref 11.5–15.5)
WBC: 4.1 10*3/uL (ref 4.0–10.5)

## 2017-03-25 LAB — ECHOCARDIOGRAM COMPLETE
AVPHT: 370 ms
CHL CUP MV DEC (S): 190
CHL CUP STROKE VOLUME: 33 mL
E decel time: 190 msec
EERAT: 5.98
FS: 22 % — AB (ref 28–44)
HEIGHTINCHES: 62 in
IV/PV OW: 0.99
LA ID, A-P, ES: 32 mm
LA vol index: 26.6 mL/m2
LA vol: 46 mL
LADIAMINDEX: 1.85 cm/m2
LAVOLA4C: 33 mL
LEFT ATRIUM END SYS DIAM: 32 mm
LV E/e' medial: 5.98
LV E/e'average: 5.98
LV PW d: 9.81 mm — AB (ref 0.6–1.1)
LV TDI E'LATERAL: 10.7
LV TDI E'MEDIAL: 6.96
LV dias vol index: 47 mL/m2
LV dias vol: 81 mL (ref 46–106)
LV sys vol index: 28 mL/m2
LVELAT: 10.7 cm/s
LVOT VTI: 18.1 cm
LVOT area: 3.14 cm2
LVOT diameter: 20 mm
LVOT peak vel: 88.7 cm/s
LVOTSV: 57 mL
LVSYSVOL: 48 mL — AB (ref 14–42)
Lateral S' vel: 14.3 cm/s
MV pk E vel: 64 m/s
MVPKAVEL: 77.1 m/s
RV sys press: 20 mmHg
Reg peak vel: 208 cm/s
Simpson's disk: 41
TAPSE: 24.4 mm
TRMAXVEL: 208 cm/s
WEIGHTICAEL: 2539.7 [oz_av]

## 2017-03-25 LAB — BASIC METABOLIC PANEL
ANION GAP: 6 (ref 5–15)
BUN: 35 mg/dL — ABNORMAL HIGH (ref 6–20)
CALCIUM: 7.8 mg/dL — AB (ref 8.9–10.3)
CHLORIDE: 116 mmol/L — AB (ref 101–111)
CO2: 15 mmol/L — AB (ref 22–32)
CREATININE: 2.19 mg/dL — AB (ref 0.44–1.00)
GFR calc Af Amer: 24 mL/min — ABNORMAL LOW (ref >60)
GFR calc non Af Amer: 21 mL/min — ABNORMAL LOW (ref >60)
GLUCOSE: 78 mg/dL (ref 65–99)
Potassium: 4.2 mmol/L (ref 3.5–5.1)
Sodium: 137 mmol/L (ref 135–145)

## 2017-03-25 LAB — TROPONIN I: Troponin I: 0.04 ng/mL (ref <0.03)

## 2017-03-25 LAB — HEMOGLOBIN A1C
HEMOGLOBIN A1C: 5.8 % — AB (ref 4.8–5.6)
Mean Plasma Glucose: 120 mg/dL

## 2017-03-25 LAB — TSH: TSH: 0.533 u[IU]/mL (ref 0.350–4.500)

## 2017-03-25 LAB — T4, FREE: Free T4: 1.53 ng/dL — ABNORMAL HIGH (ref 0.61–1.12)

## 2017-03-25 MED ORDER — MAGNESIUM SULFATE 2 GM/50ML IV SOLN
2.0000 g | Freq: Once | INTRAVENOUS | Status: AC
  Administered 2017-03-25: 2 g via INTRAVENOUS
  Filled 2017-03-25: qty 50

## 2017-03-25 MED ORDER — METOPROLOL TARTRATE 5 MG/5ML IV SOLN: 2.5000 mg | Freq: Four times a day (QID) | INTRAVENOUS | Status: DC | PRN

## 2017-03-25 MED ORDER — METOPROLOL TARTRATE 5 MG/5ML IV SOLN
2.5000 mg | Freq: Once | INTRAVENOUS | Status: AC
  Administered 2017-03-25: 2.5 mg via INTRAVENOUS
  Filled 2017-03-25: qty 5

## 2017-03-25 MED ORDER — METOPROLOL TARTRATE 25 MG PO TABS
25.0000 mg | ORAL_TABLET | Freq: Two times a day (BID) | ORAL | Status: DC
  Administered 2017-03-25 – 2017-03-26 (×3): 25 mg via ORAL
  Filled 2017-03-25 (×3): qty 1

## 2017-03-25 NOTE — Progress Notes (Addendum)
PROGRESS NOTE    Kelly Ray  WCH:[id number] DOB: August 04, 1941 DOA: 03/22/2017 PCP: Forrest Moron, MD   Brief Narrative: 76 y.o. 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 Found to have new onset atrial flutter this morning  Assessment & Plan: # New-onset atrial flutter with 2 :1 block,resolved  Troponin abnormal this admission, continue by mouth metoprolol, patient received IV metoprolol and push this a.m. Trend troponin, repeat thyroid Function testing Cardiology consult deferred for now , 2-D echo pending, follows Dr Lubertha Basque is back in NSR and will follow with cards as outpatient  Magnesium 1.5 , repleted    # Sepsis due to urinary tract infection: Patient with history of ESBL UTI. Continue IV meropenem. She was afebrile last night. Follow-up urine culture and blood culture result. Both still pending  # Pancytopenia/iron deficiency anemia: Received 2 units of red blood cell transfusion yesterday. WBC count improved. Monitor CBC, no sign of bleeding. Monitor platelet counts. Patient with history of lymphoma. Recommended outpatient follow-up with oncologist.  #Hyperlipidemia: Continue statin  # Hypertension: Noted episode of hypotension yesterday. Blood pressure better now. Continue metoprolol as tolerated. Monitor blood pressure closely.  #Hypothyroidism: Continue Synthroid  #Moderate protein calorie malnutrition: Dietary consult. Change to regular diet.  #Mild elevation in troponin likely demand ischemia secondary due to sepsis and anemia: Patient has no chest pain. Continue aspirin. 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 Monitor BMP. Avoid nephrotoxins. Normal saline at 100 mL/h. Creatinine unchanged 2.11>2.19   #Physical deconditioning/generalized weakness and mostly weak on left side since starting chemo: PT would evaluation. Continue supportive care. -check CT head  #Diffuse large B-cell lymphoma: Recommended outpatient follow-up with oncologist Dr. Learta Codding.    DVT prophylaxis: Discontinue Lovenox because of pancytopenia. Order SCD Code Status: Full code Family Communication: Discussed with the patient's husband  BY BEDSIDE  Disposition Plan: Likely discharge home in 1-2 days    Consultants:   Cardiology  Procedures: None Antimicrobials: IV meropenem since July 27  Subjective: Briefly went into a flutter this am    Objective: Vitals:   03/24/17 1700 03/24/17 2022 03/25/17 0511 03/25/17 0755  BP: 122/66 115/70 134/80 120/82  Pulse: 75 96 74 (!) 129  Resp: 18 17 16    Temp: 98.9 F (37.2 C) 98.2 F (36.8 C) 98.3 F (36.8 C)   TempSrc: Oral     SpO2: 98% 98% 100%   Weight:   72 kg (158 lb 11.7 oz)   Height:        Intake/Output Summary (Last 24 hours) at 03/25/17 3536 Last data filed at 03/25/17 0600  Gross per 24 hour  Intake             1120 ml  Output                0 ml  Net             1120 ml   Filed Weights   03/22/17 2136 03/23/17 2304 03/25/17 0511  Weight: 66.6 kg (146 lb 12.8 oz) 72.1 kg (159 lb) 72 kg (158 lb 11.7 oz)    Examination:  General exam:Not in distress Pulmonary: Clear bilateral, no wheezing or crackle  Cardiovascular system: Regular  rate rhythm S1-S2 normal. No pedal edema. Gastrointestinal system: Abdomen soft, nontender, nondistended. [place] positive Central nervous system: Alert and oriented. No focal neurological deficits. Skin: No rashes, lesions or ulcers Psychiatry: Judgement and insight appear normal. Mood & affect appropriate.     Data Reviewed: I have personally reviewed following labs and imaging  studies  CBC:  Recent Labs Lab 03/22/17 1656 03/23/17 0226 03/23/17 0844 03/24/17 0623 03/25/17 0459  WBC 5.5 3.0* 3.7* 5.5 4.1  NEUTROABS 3.3  --   --   --   --   HGB 8.8* 7.1* 7.3* 8.8* 9.3*  HCT 27.6* 21.9* 22.9* 26.7* 28.0*  MCV 87.9 88.7 89.1 87.3 88.1  PLT 196 144* 146* 123* 637*   Basic Metabolic Panel:  Recent Labs Lab 03/22/17 1656 03/23/17 0226 03/24/17 0623 03/25/17 0459  NA 132* 135 136 137  K 4.9 4.1 4.1 4.2  CL 105 113* 114* 116*  CO2 16* 15* 15* 15*  GLUCOSE 111* 94 76 78  BUN 37* 28* 33* 35*  CREATININE 2.14* 1.88* 2.11* 2.19*  CALCIUM 9.3 7.7* 7.6* 7.8*   GFR: Estimated Creatinine Clearance: 21 mL/min (A) (by C-G formula based on SCr of 2.19 mg/dL (H)). Liver Function Tests:  Recent Labs Lab 03/22/17 1656  AST 18  ALT 13*  ALKPHOS 129*  BILITOT 0.8  PROT 6.3*  ALBUMIN 3.1*   No results for input(s): LIPASE, AMYLASE in the last 168 hours. No results for input(s): AMMONIA in the last 168 hours. Coagulation Profile: No results for input(s): INR, PROTIME in the last 168 hours. Cardiac Enzymes:  Recent Labs Lab 03/22/17 1656 03/22/17 2136 03/23/17 0226 03/23/17 0844  TROPONINI 0.05* <0.03 0.05* 0.10*   BNP (last 3 results) No results for input(s): PROBNP in the last 8760 hours. HbA1C: No results for input(s): HGBA1C in the last 72 hours. CBG:  Recent Labs Lab 03/23/17 1653  GLUCAP 158*   Lipid Profile:  Recent Labs  03/23/17 0226  CHOL 100  HDL 41  LDLCALC 47  TRIG 61  CHOLHDL 2.4   Thyroid Function Tests: No results for input(s): TSH, T4TOTAL, FREET4, T3FREE, THYROIDAB in the last 72 hours. Anemia Panel:  Recent Labs  03/23/17 0844  FERRITIN 1,424*  TIBC 133*  IRON 9*   Sepsis Labs:  Recent Labs Lab 03/22/17 1959 03/22/17 2136  PROCALCITON  --  0.12  LATICACIDVEN 0.58 0.8    Recent Results (from the past 240 hour(s))  Urine culture     Status: None (Preliminary result)   Collection Time: 03/22/17   6:13 PM  Result Value Ref Range Status   Specimen Description URINE, RANDOM  Final   Special Requests NONE  Final   Culture CULTURE REINCUBATED FOR BETTER GROWTH  Final   Report Status PENDING  Incomplete  Blood Culture (routine x 2)     Status: None (Preliminary result)   Collection Time: 03/22/17  8:05 PM  Result Value Ref Range Status   Specimen Description BLOOD LEFT ARM  Final   Special Requests   Final    BOTTLES DRAWN AEROBIC ONLY Blood Culture adequate volume   Culture NO GROWTH 2 DAYS  Final   Report Status PENDING  Incomplete  Blood Culture (routine x 2)     Status: None (Preliminary result)   Collection Time: 03/22/17  8:17 PM  Result Value Ref Range Status   Specimen Description BLOOD RIGHT HAND  Final   Special Requests IN PEDIATRIC BOTTLE Blood Culture adequate volume  Final  Culture NO GROWTH 2 DAYS  Final   Report Status PENDING  Incomplete         Radiology Studies: Ct Head Wo Contrast  Result Date: 03/24/2017 CLINICAL DATA:  Altered mental status.  Weakness. EXAM: CT HEAD WITHOUT CONTRAST TECHNIQUE: Contiguous axial images were obtained from the base of the skull through the vertex without intravenous contrast. COMPARISON:  CT scan of December 13, 2016. FINDINGS: Brain: Mild chronic ischemic white matter disease is noted. No mass effect or midline shift is noted. Ventricular size is within normal limits. There is no evidence of mass lesion, hemorrhage or acute infarction. Vascular: No hyperdense vessel or unexpected calcification. Skull: Normal. Negative for fracture or focal lesion. Sinuses/Orbits: No acute finding. Other: None. IMPRESSION: Mild chronic ischemic white matter disease. No acute intracranial abnormality seen. Electronically Signed   By: Marijo Conception, M.D.   On: 03/24/2017 17:49        Scheduled Meds: . aspirin EC  81 mg Oral Daily  . feeding supplement (ENSURE ENLIVE)  237 mL Oral BID BM  . ferrous sulfate  325 mg Oral BID WC  . metoprolol  tartrate  25 mg Oral BID  . pravastatin  40 mg Oral QHS  . vitamin B-12  100 mcg Oral Daily   Continuous Infusions: . sodium chloride 100 mL/hr at 03/25/17 0753  . meropenem (MERREM) IV Stopped (03/24/17 2205)     LOS: 3 days    Reyne Dumas, MD [facility] Pager [phone]  If 7PM-7AM, please contact night-coverage [email or web address] Password [email or web address] 03/25/2017, 9:07 AM

## 2017-03-25 NOTE — Progress Notes (Signed)
Central telemetry called and stated patient was in afib with rates in the 140's. Patient is anxious, but has no further complaints. MD notified. Verbal order to to EKG and give 2.5 mg of metoprolol IV. Orders placed. Will continue to monitor.

## 2017-03-25 NOTE — Progress Notes (Signed)
Patient is now running NSR on telemetry. MD notified. Order continued for telemetry. Will continue to monitor.

## 2017-03-25 NOTE — Progress Notes (Signed)
Pharmacy Antibiotic Note Kelly Ray is a 76 y.o. female admitted on 03/22/2017 with concern for UTI. Pt with history of ESBL E.coli UTI in past. Pharmacy to dose meropenem.   The patient's renal function remains stable, SCr 2.19, CrCl~20-25 ml/min. Dose remains appropriate.   Plan: 1. Continue Meropenem 500 mg IV every 12 hours  2. Will continue to follow renal function, culture results, LOT, and antibiotic de-escalation plans   Height: 5\' 2"  (157.5 cm) Weight: 158 lb 11.7 oz (72 kg) IBW/kg (Calculated) : 50.1  Temp (24hrs), Avg:98.4 F (36.9 C), Min:98.1 F (36.7 C), Max:98.9 F (37.2 C)   Recent Labs Lab 03/22/17 1656 03/22/17 1959 03/22/17 2136 03/23/17 0226 03/23/17 0844 03/24/17 0623 03/25/17 0459  WBC 5.5  --   --  3.0* 3.7* 5.5 4.1  CREATININE 2.14*  --   --  1.88*  --  2.11* 2.19*  LATICACIDVEN  --  0.58 0.8  --   --   --   --     Estimated Creatinine Clearance: 21 mL/min (A) (by C-G formula based on SCr of 2.19 mg/dL (H)).    Allergies  Allergen Reactions  . Lisinopril Cough    Antimicrobials this admission:  Meropenem 7/27>>  CTX 7/27 x1  Dose adjustments this admission:  n/a  Microbiology results:  7/27 UCx >> 100k GNR 7/27 BCx: ngtd  Thank you for allowing pharmacy to be a part of this patient's care.  Alycia Rossetti, PharmD, BCPS Clinical Pharmacist Pager: [phone] Clinical phone for 03/25/2017 from 7a-3:30p: [phone] If after 3:30p, please call main pharmacy at: [phone] 03/25/2017 11:45 AM

## 2017-03-25 NOTE — Progress Notes (Signed)
Troponin is 0.04. MD notified. No further orders. Continue to monitor.

## 2017-03-25 NOTE — Consult Note (Signed)
Physical Medicine and Rehabilitation Consult   Reason for Consult: Debility Referring Physician: Dr. Carolin Sicks.    HPI: Kelly Ray is a 76 y.o. female with history of large B cell lymphoma-ongoing chemo, frequent UTIs, chemo induced neuropathy with gait disorder, anemia, CKD who was admitted on 03/22/17 with progressive weakness, recent falls, dizziness, acute on chronic renal failure with hyperkalemia and urinary frequency.  History taken from chart review, husband, and patient. She was found to be septic due to UTI and started on meropenum for treatment as well as IVF for hypotension with tachycardia and tachypnea.  Hospital course significant for pancytopenia and she was treated with 2 units PRBC. She continues to be limited by weakness affecting mobility and ability to carry out ADL tasks therefore CIR recommended by rehab team. Patient using walker at baseline.   Review of Systems  Constitutional: Negative for chills and fever.  Musculoskeletal: Positive for falls.  Neurological: Positive for dizziness, sensory change and weakness.  All other systems reviewed and are negative.     Past Medical History:  Diagnosis Date  . Anemia    SECONDARY TO CHEMOTHERAPY  . Arthritis    Osteoarthritis  . B-cell lymphoma ([place]) 05/04/11   Large B-cell lymphoma  . Cancer ([place])    NHL  . Hx of radiation therapy 08/17/13- 09/03/13   right lower extremity- lymphoma  . Hypertension   . S/P knee replacement    Left knee  . Thyroid disease    Hyperthyroidism    Past Surgical History:  Procedure Laterality Date  . IR GENERIC HISTORICAL  07/13/2016   IR US GUIDE VASC ACCESS RIGHT 07/13/2016 [facility]  . IR GENERIC HISTORICAL  07/13/2016   IR FLUORO GUIDE PORT INSERTION RIGHT 07/13/2016 [facility]  . NECK LESION BIOPSY Right 05/04/11   node biopsies  . TOTAL KNEE ARTHROPLASTY Left    x 2    Family History  Problem Relation Age of Onset  . Cancer Mother        bladder  .  Heart attack Father   . Cancer Brother        kidney    Social History:  reports that she has never smoked. She has never used smokeless tobacco. She reports that she does not drink alcohol or use drugs.    Allergies  Allergen Reactions  . Lisinopril Cough    Medications Prior to Admission  Medication Sig Dispense Refill  . acetaminophen (TYLENOL) 325 MG tablet Take 650 mg by mouth every 6 (six) hours as needed for mild pain.    Marland Kitchen ALPRAZolam (XANAX) 0.25 MG tablet Take 1 tablet (0.25 mg total) by mouth at bedtime as needed for anxiety. 30 tablet 0  . Cyanocobalamin (VITAMIN B-12 PO) Take 1 tablet by mouth daily.    Marland Kitchen levothyroxine (SYNTHROID, LEVOTHROID) 112 MCG tablet Take 1 tablet daily 90 tablet 3  . metoprolol tartrate (LOPRESSOR) 25 MG tablet Take 0.5 tablets (12.5 mg total) by mouth 2 (two) times daily. (Patient taking differently: Take 25 mg by mouth 2 (two) times daily. ) 15 tablet 0  . pravastatin (PRAVACHOL) 40 MG tablet Take 1 tablet (40 mg total) by mouth at bedtime. 30 tablet 0  . prochlorperazine (COMPAZINE) 10 MG tablet Take 1 tablet (10 mg total) by mouth every 6 (six) hours as needed for nausea or vomiting. 30 tablet 0    Home: Home Living Family/patient expects to be discharged to:: Private residence Living Arrangements: Spouse/significant other  Available Help at Discharge: Available 24 hours/day Type of Home: House Home Access: Ramped entrance Home Layout: One level Bathroom Shower/Tub: Multimedia programmer: Standard Bathroom Accessibility: Yes Home Equipment: Environmental consultant - 2 wheels, Shower seat - built in, [employer] - toilet, Grab bars - tub/shower, Bedside commode, Shower seat, Wheelchair - manual Additional Comments: reports recent falls  Functional History: Prior Function Level of Independence: Independent with assistive device(s) Comments: states she was haivng HHPT Functional Status:  Mobility: Bed Mobility Overal bed mobility: Needs  Assistance Bed Mobility: Sidelying to Sit Sidelying to sit: Mod assist Supine to sit: Mod assist Sit to supine: Mod assist General bed mobility comments: L lateral lean when sidelying to sit Transfers Overall transfer level: Needs assistance Transfer via Lift Equipment: Marketing executive Transfers: Sit to/from Stand Sit to Stand: Mod assist, +2 physical assistance, +2 safety/equipment General transfer comment: unable to complete stand pivot due to "knee buckling". Could complete transfer using Filiberto Pinks      ADL: ADL Overall ADL's : Needs assistance/impaired Grooming: Set up, Sitting Upper Body Bathing: Set up, Supervision/ safety, Bed level Lower Body Bathing: Moderate assistance, Sit to/from stand Upper Body Dressing : Minimal assistance, Sitting Lower Body Dressing: Moderate assistance, Sit to/from Lobbyist Details (indicate cue type and reason): unable to complete due to weakness and feeling dizzy Toileting- Clothing Manipulation and Hygiene: Maximal assistance Functional mobility during ADLs: Moderate assistance (limited to sit - stand) General ADL Comments: significant functional decline  Cognition: Cognition Overall Cognitive Status: Within Functional Limits for tasks assessed Orientation Level: Oriented to person, Oriented to place, Disoriented to time, Disoriented to situation Cognition Arousal/Alertness: Awake/alert Behavior During Therapy: Flat affect Overall Cognitive Status: Within Functional Limits for tasks assessed Area of Impairment: Attention, Memory, Safety/judgement, Awareness Current Attention Level: Selective Memory: Decreased short-term memory Safety/Judgement: Decreased awareness of safety, Decreased awareness of deficits Awareness: Emergent  Blood pressure 121/78, pulse 72, temperature 98.1 F (36.7 C), temperature source Oral, resp. rate 18, height 5\' 2"  (1.575 m), weight 72 kg (158 lb 11.7 oz), SpO2 100 %. Physical Exam  Constitutional:  She appears well-developed.  Obese  HENT:  Head: Normocephalic and atraumatic.  Eyes: EOM are normal. Right eye exhibits no discharge. Left eye exhibits no discharge.  Neck: Normal range of motion. Neck supple.  Cardiovascular: Normal rate and regular rhythm.   Respiratory: Effort normal and breath sounds normal.  GI: Soft. Bowel sounds are normal.  Musculoskeletal: She exhibits edema. She exhibits no tenderness.  Neurological: She is alert.  Motor: 4/5 throughout Sensation intact to light touch  Skin: Skin is warm and dry.  Psychiatric: She has a normal mood and affect. Her behavior is normal.    Results for orders placed or performed during the hospital encounter of 03/22/17 (from the past 24 hour(s))  CBC     Status: Abnormal   Collection Time: 03/25/17  4:59 AM  Result Value Ref Range   WBC 4.1 4.0 - 10.5 K/uL   RBC 3.18 (L) 3.87 - 5.11 MIL/uL   Hemoglobin 9.3 (L) 12.0 - 15.0 g/dL   HCT 28.0 (L) 36.0 - 46.0 %   MCV 88.1 78.0 - 100.0 fL   MCH 29.2 26.0 - 34.0 pg   MCHC 33.2 30.0 - 36.0 g/dL   RDW 18.8 (H) 11.5 - 15.5 %   Platelets 124 (L) 150 - 400 K/uL  Basic metabolic panel     Status: Abnormal   Collection Time: 03/25/17  4:59 AM  Result Value  Ref Range   Sodium 137 135 - 145 mmol/L   Potassium 4.2 3.5 - 5.1 mmol/L   Chloride 116 (H) 101 - 111 mmol/L   CO2 15 (L) 22 - 32 mmol/L   Glucose, Bld 78 65 - 99 mg/dL   BUN 35 (H) 6 - 20 mg/dL   Creatinine, Ser 2.19 (H) 0.44 - 1.00 mg/dL   Calcium 7.8 (L) 8.9 - 10.3 mg/dL   GFR calc non Af Amer 21 (L) >60 mL/min   GFR calc Af Amer 24 (L) >60 mL/min   Anion gap 6 5 - 15  TSH     Status: None   Collection Time: 03/25/17  9:09 AM  Result Value Ref Range   TSH 0.533 0.350 - 4.500 uIU/mL  T4, free     Status: Abnormal   Collection Time: 03/25/17  9:09 AM  Result Value Ref Range   Free T4 1.53 (H) 0.61 - 1.12 ng/dL  Magnesium     Status: Abnormal   Collection Time: 03/25/17  9:09 AM  Result Value Ref Range   Magnesium  1.5 (L) 1.7 - 2.4 mg/dL  Troponin I     Status: Abnormal   Collection Time: 03/25/17  9:09 AM  Result Value Ref Range   Troponin I 0.04 (HH) <0.03 ng/mL   Ct Head Wo Contrast  Result Date: 03/24/2017 CLINICAL DATA:  Altered mental status.  Weakness. EXAM: CT HEAD WITHOUT CONTRAST TECHNIQUE: Contiguous axial images were obtained from the base of the skull through the vertex without intravenous contrast. COMPARISON:  CT scan of December 13, 2016. FINDINGS: Brain: Mild chronic ischemic white matter disease is noted. No mass effect or midline shift is noted. Ventricular size is within normal limits. There is no evidence of mass lesion, hemorrhage or acute infarction. Vascular: No hyperdense vessel or unexpected calcification. Skull: Normal. Negative for fracture or focal lesion. Sinuses/Orbits: No acute finding. Other: None. IMPRESSION: Mild chronic ischemic white matter disease. No acute intracranial abnormality seen. Electronically Signed   By: Marijo Conception, M.D.   On: 03/24/2017 17:49    Assessment/Plan: Diagnosis: Debility Labs independently reviewed.  Records reviewed and summated above.  1. Does the need for close, 24 hr/day medical supervision in concert with the patient's rehab needs make it unreasonable for this patient to be served in a less intensive setting? Yes  2. Co-Morbidities requiring supervision/potential complications: pancytopenia (cont to monitor, treat as necessary), large B cell lymphoma (cont chemo per Heme/Onc), frequent UTIs (cont abx), chemo induced neuropathy (cont to monitor for pain, adjust meds as necessary), gait disorder, ABLA on anemia of chronic disease (transfuse if necessary to ensure appropriate perfusion for increased activity tolerance), CKD (avoid nephrotoxic meds) 3. Due to bladder management, safety, skin/wound care, disease management and patient education, does the patient require 24 hr/day rehab nursing? Yes 4. Does the patient require coordinated care of  a physician, rehab nurse, PT (1-2 hrs/day, 5 days/week) and OT (1-2 hrs/day, 5 days/week) to address physical and functional deficits in the context of the above medical diagnosis(es)? Yes Addressing deficits in the following areas: balance, endurance, locomotion, strength, transferring, bathing, dressing, toileting and psychosocial support 5. Can the patient actively participate in an intensive therapy program of at least 3 hrs of therapy per day at least 5 days per week? Yes 6. The potential for patient to make measurable gains while on inpatient rehab is excellent 7. Anticipated functional outcomes upon discharge from inpatient rehab are supervision and min assist  with  PT, supervision and min assist with OT, n/a with SLP. 8. Estimated rehab length of stay to reach the above functional goals is: 12-16 days. 9. Anticipated D/C setting: Home 10. Anticipated post D/C treatments: HH therapy and Home excercise program 11. Overall Rehab/Functional Prognosis: excellent and good  RECOMMENDATIONS: This patient's condition is appropriate for continued rehabilitative care in the following setting: CIR Patient has agreed to participate in recommended program. Yes Note that insurance prior authorization may be required for reimbursement for recommended care.  Comment: Rehab Admissions Coordinator to follow up.  Delice Lesch, MD, [place], [place] 03/25/2017

## 2017-03-25 NOTE — Progress Notes (Signed)
I met with pt's brother outside of pt room for pt crying and upset. I have asked brother to have husband contact me upon his return so that we can begin discussions concerning her rehab venue options. [phone]

## 2017-03-25 NOTE — Progress Notes (Signed)
  Echocardiogram 2D Echocardiogram has been performed.  Kelly Ray 03/25/2017, 2:28 PM

## 2017-03-26 DIAGNOSIS — N170 Acute kidney failure with tubular necrosis: Secondary | ICD-10-CM

## 2017-03-26 LAB — URINE CULTURE: Culture: >=100000 — AB

## 2017-03-26 LAB — CBC
HEMATOCRIT: 28.1 % — AB (ref 36.0–46.0)
Hemoglobin: 9.4 g/dL — ABNORMAL LOW (ref 12.0–15.0)
MCH: 29.1 pg (ref 26.0–34.0)
MCHC: 33.5 g/dL (ref 30.0–36.0)
MCV: 87 fL (ref 78.0–100.0)
Platelets: 144 10*3/uL — ABNORMAL LOW (ref 150–400)
RBC: 3.23 MIL/uL — ABNORMAL LOW (ref 3.87–5.11)
RDW: 18.2 % — AB (ref 11.5–15.5)
WBC: 4.1 10*3/uL (ref 4.0–10.5)

## 2017-03-26 LAB — COMPREHENSIVE METABOLIC PANEL
ALK PHOS: 140 U/L — AB (ref 38–126)
ALT: 13 U/L — ABNORMAL LOW (ref 14–54)
ANION GAP: 6 (ref 5–15)
AST: 20 U/L (ref 15–41)
Albumin: 1.9 g/dL — ABNORMAL LOW (ref 3.5–5.0)
BILIRUBIN TOTAL: 0.5 mg/dL (ref 0.3–1.2)
BUN: 34 mg/dL — AB (ref 6–20)
CALCIUM: 7.9 mg/dL — AB (ref 8.9–10.3)
CO2: 15 mmol/L — ABNORMAL LOW (ref 22–32)
Chloride: 117 mmol/L — ABNORMAL HIGH (ref 101–111)
Creatinine, Ser: 1.86 mg/dL — ABNORMAL HIGH (ref 0.44–1.00)
GFR calc Af Amer: 30 mL/min — ABNORMAL LOW (ref >60)
GFR, EST NON AFRICAN AMERICAN: 26 mL/min — AB (ref >60)
GLUCOSE: 82 mg/dL (ref 65–99)
POTASSIUM: 4.1 mmol/L (ref 3.5–5.1)
Sodium: 138 mmol/L (ref 135–145)
TOTAL PROTEIN: 4.3 g/dL — AB (ref 6.5–8.1)

## 2017-03-26 LAB — MAGNESIUM: MAGNESIUM: 2.1 mg/dL (ref 1.7–2.4)

## 2017-03-26 MED ORDER — ENSURE ENLIVE PO LIQD: 237.0000 mL | Freq: Two times a day (BID) | ORAL | 12 refills | Status: AC

## 2017-03-26 MED ORDER — CEFPODOXIME PROXETIL 200 MG PO TABS: 200.0000 mg | ORAL_TABLET | Freq: Two times a day (BID) | ORAL | Status: DC

## 2017-03-26 MED ORDER — ASPIRIN 81 MG PO TBEC
81.0000 mg | DELAYED_RELEASE_TABLET | Freq: Every day | ORAL | 1 refills | Status: AC
Start: 2017-03-26 — End: ?

## 2017-03-26 MED ORDER — CEFPODOXIME PROXETIL 200 MG PO TABS: 200.0000 mg | ORAL_TABLET | Freq: Two times a day (BID) | ORAL | 0 refills | Status: AC

## 2017-03-26 MED ORDER — FOSFOMYCIN TROMETHAMINE 3 G PO PACK: 3.0000 g | PACK | Freq: Once | ORAL | 0 refills | Status: AC

## 2017-03-26 MED ORDER — FERROUS SULFATE 325 (65 FE) MG PO TABS: 325.0000 mg | ORAL_TABLET | Freq: Two times a day (BID) | ORAL | 3 refills | Status: AC

## 2017-03-26 MED ORDER — MAGNESIUM OXIDE 400 MG PO CAPS: 400.0000 mg | ORAL_CAPSULE | Freq: Every morning | ORAL | 2 refills | Status: AC

## 2017-03-26 MED ORDER — FOSFOMYCIN TROMETHAMINE 3 G PO PACK
3.0000 g | PACK | Freq: Once | ORAL | Status: AC
  Administered 2017-03-26: 3 g via ORAL
  Filled 2017-03-26: qty 3

## 2017-03-26 NOTE — NC FL2 (Signed)
[place] MEDICAID FL2 LEVEL OF CARE SCREENING TOOL     IDENTIFICATION  Patient Name: Kelly Ray Birthdate: Jan 13, 1941 Sex: female Admission Date (Current Location): 03/22/2017  [facility] and [place] Number:  Herbalist and Address:  [facility] [place], [place] [address] [place], [place] [address]      Provider Number: [id number]  Attending Physician Name and Address:  Reyne Dumas, MD  Relative Name and Phone Number:  Nasha Diss: [phone] (mobile) and Myrene Galas - daughter; [phone]    Current Level of Care: Hospital Recommended Level of Care: [place] Prior Approval Number:    Date Approved/Denied:   PASRR Number:  (Submitted for PASRR 7/31 - MUST NO#[id number])  Discharge Plan: SNF    Current Diagnoses: Patient Active Problem List   Diagnosis Date Noted  . Recurrent UTI   . Chemotherapy-induced neuropathy ([place])   . Acute blood loss anemia   . Anemia of chronic disease   . Stage 3 chronic kidney disease   . Elevated troponin 03/22/2017  . Acute renal failure superimposed on stage 3 chronic kidney disease ([place]) 03/22/2017  . Symptomatic anemia 03/22/2017  . Protein-calorie malnutrition, severe 02/21/2017  . Pressure injury of skin 02/20/2017  . Acute renal failure ([place])   . Hyperkalemia   . Sepsis ([place]) 02/19/2017  . UTI due to extended-spectrum beta lactamase (ESBL) producing Escherichia coli   . AKI (acute kidney injury) ([place])   . Ataxia 12/19/2016  . Acute lower UTI   . Peripheral edema 11/27/2016  . Hypotension 08/21/2016  . Dehydration 08/21/2016  . Antineoplastic chemotherapy induced pancytopenia (CODE) ([place]) 08/21/2016  . Esophagitis 08/21/2016  . UTI (urinary tract infection) 08/21/2016  . Port catheter in place 08/17/2016  . Diffuse large B-cell lymphoma of lymph nodes of lower extremity ([place]) 12/27/2015  . Hypercholesterolemia 08/06/2013  . Hypertension 08/06/2013  . Hypothyroid 08/06/2013  .  B-cell lymphoma ([place]) 07/17/2011    Orientation RESPIRATION BLADDER Height & Weight     Self, Situation, Place  Normal External catheter (Catheter placed 7/27) Weight: 158 lb 15.2 oz (72.1 kg) Height:  5\' 2"  (157.5 cm)  BEHAVIORAL SYMPTOMS/MOOD NEUROLOGICAL BOWEL NUTRITION STATUS      Continent Diet (Regular)  AMBULATORY STATUS COMMUNICATION OF NEEDS Skin   Total Care (Patient unable - knees buckling per PT) Verbally Other (Comment) (Stage 2 pressure injury to left proximal buttocks )                       Personal Care Assistance Level of Assistance  Bathing, Feeding, Dressing Bathing Assistance: Maximum assistance (Upper body assistance with set-up/supervision and Lower body mod assist) Feeding assistance: Independent Dressing Assistance: Maximum assistance (Upper body min assist and Lower body mod assist)     Functional Limitations Info  Sight, Hearing, Speech Sight Info: Impaired (Wears glasses) Hearing Info: Adequate Speech Info: Adequate    SPECIAL CARE FACTORS FREQUENCY  PT (By licensed PT), OT (By licensed OT)     PT Frequency: Evaluated 7/29 and a minimum of 3X per week therapy recommended OT Frequency: Evaluated 7/29 and a minimum of 2X per week therapy recommended            Contractures Contractures Info: Not present    Additional Factors Info  Code Status, Allergies Code Status Info: Full Allergies Info: Lisinopril           Current Medications (03/26/2017):  This is the current hospital active medication list Current Facility-Administered  Medications  Medication Dose Route Frequency Provider Last Rate Last Dose  . 0.9 %  sodium chloride infusion   Intravenous Continuous Rosita Fire, MD 100 mL/hr at 03/26/17 0456    . acetaminophen (TYLENOL) tablet 650 mg  650 mg Oral Q6H PRN Ivor Costa, MD   650 mg at 03/26/17 1259  . ALPRAZolam Duanne Moron) tablet 0.25 mg  0.25 mg Oral QHS PRN Ivor Costa, MD   0.25 mg at 03/26/17 0456  . aspirin EC tablet  81 mg  81 mg Oral Daily Ivor Costa, MD   81 mg at 03/26/17 1005  . feeding supplement (ENSURE ENLIVE) (ENSURE ENLIVE) liquid 237 mL  237 mL Oral BID BM Ivor Costa, MD   237 mL at 03/26/17 1000  . ferrous sulfate tablet 325 mg  325 mg Oral BID WC Rosita Fire, MD   325 mg at 03/26/17 1005  . metoprolol tartrate (LOPRESSOR) injection 2.5 mg  2.5 mg Intravenous Q6H PRN Reyne Dumas, MD      . metoprolol tartrate (LOPRESSOR) tablet 25 mg  25 mg Oral BID Reyne Dumas, MD   25 mg at 03/26/17 1005  . ondansetron (ZOFRAN) tablet 4 mg  4 mg Oral Q6H PRN Ivor Costa, MD       Or  . ondansetron [facility]) injection 4 mg  4 mg Intravenous Q6H PRN Ivor Costa, MD   4 mg at 03/25/17 1434  . pravastatin (PRAVACHOL) tablet 40 mg  40 mg Oral QHS Ivor Costa, MD   40 mg at 03/25/17 2118  . prochlorperazine (COMPAZINE) tablet 10 mg  10 mg Oral Q6H PRN Ivor Costa, MD      . vitamin B-12 (CYANOCOBALAMIN) tablet 100 mcg  100 mcg Oral Daily Ivor Costa, MD   100 mcg at 03/26/17 1005  . zolpidem (AMBIEN) tablet 5 mg  5 mg Oral QHS PRN Ivor Costa, MD   5 mg at 03/25/17 2118     Discharge Medications: Please see discharge summary for a list of discharge medications.  Relevant Imaging Results:  Relevant Lab Results:   Additional Information ss#[id number]. Patient on contact precaution for history of ESBL. Sable Feil, LCSW

## 2017-03-26 NOTE — Progress Notes (Signed)
Patient discharged to home, AVS reviewed and prescriptions sent to patients pharmacy, IV and tele removed, Patient was assisted to off floor by staff via wheelchair

## 2017-03-26 NOTE — Plan of Care (Signed)
Problem: Activity: Goal: Risk for activity intolerance will decrease Outcome: Progressing Patient is still very weak, unable to stand without assistance, requiring 2-assist

## 2017-03-26 NOTE — Progress Notes (Signed)
I met with pt and spouse at bedside. I explained that insurance has denied admission to inpt rehab. Spouse prefers to d/c home today for her daughter is arriving today and will be there for 5 days. I expressed the need for pt assistance at home after daughter leaves after 5 days. Spouse states his wife's brother is very involved and can assist him with her care. I have alerted SW of spouse plans. I will contact P.T. To mobilize pt this morning with spouse present to assure she is mobilizing well enough to d/c home with spouse today realistically. We will sign off. [phone]

## 2017-03-26 NOTE — Care Management Note (Signed)
Case Management Note Marvetta Gibbons RN, BSN Unit 4E-Case Manager-- 6E coverage [phone]  Patient Details  Name: Kelly Ray MRN: [id number] Date of Birth: [birth year]  Subjective/Objective:  Pt admitted with UTI/sepsis                 Action/Plan: PTA pt lived at home with spouse per PT eval recommendation for CIR- CIR consulted- however pt was denied by insurance- recommendation for SNF- but pt and family have declined and want to return home- PTA pt was active with [facility] for [facility] services- orders have been placed for HHRN/PT/OT/aide/sw- spoke with pt and she is agreeable to continue with [facility] for [facility] services- states she has all needed DME- does not want a hospital bed and states that her family will transport her home- call made to [facility] with [facility] for resumption of [facility] services.   Expected Discharge Date:  03/26/17               Expected Discharge Plan:  [place]  In-House Referral:  Clinical Social Work  Discharge planning Services  CM Consult  Post Acute Care Choice:  Home Health, Resumption of Svcs/PTA Provider Choice offered to:  Patient  DME Arranged:  N/A DME Agency:  NA  HH Arranged:  RN, PT, OT, Nurse's Aide [place] Agency:  [facility] (formerly [employer])  Status of Service:  Completed, signed off  If discussed at [employer], dates discussed:    Discharge Disposition: home/home health.    Additional Comments:  Dawayne Patricia, RN 03/26/2017, 3:54 PM

## 2017-03-26 NOTE — Clinical Social Work Note (Signed)
CSW informed by patient's nurse that d/c plan is home with family. CSW will continue to follow through d/c.  Kelly Ray, MSW, LCSW Licensed Clinical Social Worker [place] [phone]

## 2017-03-26 NOTE — Progress Notes (Signed)
Physical Therapy Treatment Patient Details Name: Kelly Ray MRN: [id number] DOB: [birth year] Today's Date: 03/26/2017    History of Present Illness Patient is a 76 y/o female who presents with weakness. Found to have hyperkalemia of 7.4 in the setting of acute renal failure and hypotension 70s/50s. EKG-peaked T-waves.  PMH includes large B cell lymphoma, HTN, thyroid disease.    PT Comments    Pt presented awake and alert, and able to participate in the session more actively than seen in evaluation. Pt required 1 hand support for bed mobility, and min guard to min assist for sitting EOB. Husband participated in session to show [place] how he assists in transfers at home. Kelly Ray was able to use her UEs to stand briefly, but was limited by her bouts of dizziness. The need to return to seated prevented the ability to perform a stand pivot to a chair using the RW, and ambulation. Transfer to chair required mod assist with pt's UEs dependent on the physical assistance.   Patient will continue to benefit from skilled PT. The best recommendation is for a d/c to a SNF; however, both Kelly Ray are determined to go home, and when asked directly husband states she will not go to a SNF if she has to sleep there. Husband believes home is the most therapeutic location for her. They assure [place] there will be 24 hour support from both the husband and their visiting daughter; brother is also available for physical support and lives in close proximity. Kelly Ray will be wheel chair dependent to be discharged to home at this time.  At home pt will benefit from maximized home health services, including PT, OT and RN and aide. This is to make any safety adjustments to the house/bathroom and to assist the husband in learning the safest way to transfer Kelly Ray.     Follow Up Recommendations  SNF;Other (comment) Discharge to SNF remains most appropriate plan; however, after discussion with pt and her husband  they are most comfortable going home. With 24 hour care and maximized home health services (PT, OT, RN, aide) discharge to home (per pt and husband's wishes) is doable, if not optimal.     Equipment Recommendations  Hospital bed;Other (comment) (Would like HH to assess need for shower chair )    Recommendations for Other Services OT consult;Speech consult     Precautions / Restrictions Precautions Precautions: Fall Restrictions Weight Bearing Restrictions: No    Mobility  Bed Mobility Overal bed mobility: Needs Assistance Bed Mobility: Supine to Sit     Supine to sit: Mod assist     General bed mobility comments: Bed rails lowered to mimic home environment; cues for hand placement and to bring legs off of bed; good use of elbow prop to adjust body position  Transfers Overall transfer level: Needs assistance Equipment used: Rolling walker (2 wheeled);1 person hand held assist Transfers: Sit to/from [employer] Sit to Stand: Min assist;+2 safety/equipment   Squat pivot transfers: Mod assist     General transfer comment: Pt performed 3 sit to stand transfers with RW and +2 for support and safety. UEs were used to power up. Pt could only remain upright for 3 seconds maximum before needing to sit due to dizziness. Squat pivot was performed from bed to recliner with drop arm. Cues were given for small side shuffles to pivot body.   Ambulation/Gait  Stairs            Wheelchair Mobility    Modified Rankin (Stroke Patients Only)       Balance Overall balance assessment: Needs assistance Sitting-balance support: Bilateral upper extremity supported;Feet supported Sitting balance-Leahy Scale: Poor Sitting balance - Comments: anterior lean, slight left lateral lean with left sided UE weakness; pt showed improvement from last session, and required min guard at times. Postural control: Left lateral lean Standing balance support:  Bilateral upper extremity supported Standing balance-Leahy Scale: Zero Standing balance comment: Could not stand without physical assistance; could not maintain upright standing for > 3 seconds                            Cognition Arousal/Alertness: Awake/alert   Overall Cognitive Status: Within Functional Limits for tasks assessed                                 General Comments: Increased time for thought processing and following simple instructions       Exercises      General Comments        Pertinent Vitals/Pain Pain Assessment: No/denies pain    Home Living                      Prior Function            PT Goals (current goals can now be found in the care plan section) Acute Rehab PT Goals Patient Stated Goal: to get stronger, to go home  Progress towards PT goals: Progressing toward goals    Frequency    Min 3X/week      PT Plan Discharge plan needs to be updated    Co-evaluation              AM-PAC PT "6 Clicks" Daily Activity  Outcome Measure  Difficulty turning over in bed (including adjusting bedclothes, sheets and blankets)?: Total Difficulty moving from lying on back to sitting on the side of the bed? : Total Difficulty sitting down on and standing up from a chair with arms (e.g., wheelchair, bedside commode, etc,.)?: Total Help needed moving to and from a bed to chair (including a wheelchair)?: A Lot Help needed walking in hospital room?: Total Help needed climbing 3-5 steps with a railing? : Total 6 Click Score: 7    End of Session Equipment Utilized During Treatment: Gait belt Activity Tolerance: Patient tolerated treatment well;Other (comment) (standing tolerance limited by dizziness ) Patient left: in chair;with call bell/phone within reach;with family/visitor present   PT Visit Diagnosis: Other abnormalities of gait and mobility (R26.89);History of falling (Z91.81);Muscle weakness (generalized)  (M62.81);Other symptoms and signs involving the nervous system (R29.898)     Time: [id number] PT Time Calculation (min) (ACUTE ONLY): 58 min  Charges:  $Therapeutic Activity: 38-52 mins  $Self Care/Home Management: 8-22 mins                    G Codes:       Ave Filter, SPT Acute Rehabilitation Services Office: [phone]    DJMEQ ASTMH 03/26/2017, 2:34 PM   I was present during the PT session and agree with patient status and findings as outlined by Marianna Fuss, SPT.  Roney [place], [place]  Acute Rehabilitation Services Pager [phone] Office [phone]

## 2017-03-26 NOTE — Discharge Summary (Addendum)
Physician Discharge Summary  Kelly Kelly Ray MRN: [id number] DOB/AGE: 04-03-1941 76 y.o.  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 [facility] for diffuse hypokinesis, EF 40%,  brief onset atrial flutter during this hospitalization      Current Discharge Medication List    START taking these medications   Details  aspirin EC 81 MG EC tablet Take 1 tablet (81 mg total) by mouth daily. Qty: 30 tablet, Refills: 1          feeding supplement, ENSURE ENLIVE, (ENSURE ENLIVE) LIQD Take 237 mLs by mouth 2 (two) times daily between meals. Qty: 237 mL, Refills: 12    ferrous sulfate 325 (65 FE) MG tablet Take 1 tablet (325 mg total) by mouth 2 (two) times daily with a meal. Qty: 60 tablet, Refills: 3          Magnesium Oxide 400 MG CAPS Take 1 capsule (400 mg total) by mouth every morning. Qty: 30 each, Refills: 2      CONTINUE these medications which have NOT CHANGED   Details  acetaminophen (TYLENOL) 325 MG tablet Take 650 mg by mouth every 6 (six) hours as needed for mild pain.    ALPRAZolam (XANAX) 0.25 MG tablet Take 1 tablet (0.25 mg total) by mouth at bedtime as needed for anxiety. Qty: 30 tablet, Refills: 0    Cyanocobalamin (VITAMIN B-12 PO)  Take 1 tablet by mouth daily.    levothyroxine (SYNTHROID, LEVOTHROID) 112 MCG tablet Take 1 tablet daily Qty: 90 tablet, Refills: 3   Associated Diagnoses: Hypothyroidism, unspecified hypothyroidism type    metoprolol tartrate (LOPRESSOR) 25 MG tablet Take 0.5 tablets (12.5 mg total) by mouth 2 (two) times daily. Qty: 15 tablet, Refills: 0   Associated Diagnoses: Essential hypertension    pravastatin (PRAVACHOL) 40 MG tablet Take 1 tablet (40 mg total) by mouth at bedtime. Qty: 30 tablet, Refills: 0   Associated Diagnoses: Hyperlipidemia    prochlorperazine (COMPAZINE) 10 MG tablet Take 1 tablet (10 mg total) by mouth every 6 (six) hours as needed for nausea or vomiting. Qty: 30 tablet, Refills: 0         Discharge Condition:  Stable   Discharge Instructions Get Medicines reviewed and adjusted: Please take all your medications with you for your next visit with your Primary MD  Please request your Primary MD to go over all hospital tests and procedure/radiological results at the follow up, please ask your Primary MD to get all Hospital records sent to his/her office.  If you experience worsening of your admission symptoms, develop shortness of breath, life threatening emergency, suicidal or homicidal thoughts you must seek medical attention immediately by calling 911 or calling your MD immediately if symptoms less severe.  You must read  complete instructions/literature along with all the possible adverse reactions/side effects for all the Medicines you take and that have been prescribed to you. Take any new Medicines after you have completely understood and accpet all the possible adverse reactions/side effects.   Do not drive when taking Pain medications.   Do not take more than prescribed Pain, Sleep and Anxiety Medications  Special Instructions: If you have smoked or chewed Tobacco in the last 2 yrs please stop smoking, stop any regular Alcohol and or any Recreational drug  use.  Wear Seat belts while driving.  Please note  You were cared for by a hospitalist during your hospital stay. Once you are discharged, your primary care physician will handle any further medical issues. Please note that NO REFILLS for any discharge medications will be authorized once you are discharged, as it is imperative that you return to your primary care physician (or establish a relationship with a primary care physician if you do not have one) for your aftercare needs so that they can reassess your need for medications and monitor your lab values.     Allergies  Allergen Reactions  . Lisinopril Cough      Disposition: Home with home health   Consults:  None    Significant Diagnostic Studies:  Dg Chest 2 View  Result Date: 03/22/2017 CLINICAL DATA:  Tachycardia. Patient is unable lift the arms for positioning. EXAM: CHEST  2 VIEW COMPARISON:  06/07/2015 FINDINGS: Since the previous study, there is interval placement of a power port type central venous catheter. Tip appears to project over the low SVC region. No pneumothorax. Multiple EKG wires and leads limit evaluation. Heart size and pulmonary vascularity are normal. Lungs appear clear. No blunting of costophrenic angles. No pneumothorax. Degenerative changes in the shoulders. IMPRESSION: No active cardiopulmonary disease. Electronically Signed   By: Lucienne Capers M.D.   On: 03/22/2017 18:42   Ct Head Wo Contrast  Result Date: 03/24/2017 CLINICAL DATA:  Altered mental status.  Kelly Ray. EXAM: CT HEAD WITHOUT CONTRAST TECHNIQUE: Contiguous axial images were obtained from the base of the skull through the vertex without intravenous contrast. COMPARISON:  CT scan of December 13, 2016. FINDINGS: Brain: Mild chronic ischemic white matter disease is noted. No mass effect or midline shift is noted. Ventricular size is within normal limits. There is no evidence of mass lesion, hemorrhage or Kelly infarction. Vascular: No  hyperdense vessel or unexpected calcification. Skull: Normal. Negative for fracture or focal lesion. Sinuses/Orbits: No Kelly finding. Other: None. IMPRESSION: Mild chronic ischemic white matter disease. No Kelly intracranial abnormality seen. Electronically Signed   By: Marijo Conception, M.D.   On: 03/24/2017 17:49    echocardiogram  LV EF: 40% -   45%  ------------------------------------------------------------------- History:   PMH:  Elevated troponin.  Risk factors:  Chronic kidney disease. History of Cancer.  ------------------------------------------------------------------- Study Conclusions  - Left ventricle: The cavity size was normal. Wall thickness was   normal. Systolic function was mildly to moderately reduced. The   estimated ejection fraction was in the range of 40% to 45%.   Diffuse hypokinesis. Doppler parameters are consistent with   abnormal left ventricular relaxation (grade 1 diastolic   dysfunction). - Aortic valve: There was mild regurgitation. - Aorta: Aortic root dimension: 38 mm (ED). - Aortic root: The aortic root was mildly dilated. - Mitral valve: There was mild regurgitation.  Impressions:  - EF is reduced when compared to prior.      Filed Weights  03/23/17 2304 03/25/17 0511 03/25/17 2114  Weight: 72.1 kg (159 lb) 72 kg (158 lb 11.7 oz) 72.1 kg (158 lb 15.2 oz)     Microbiology: Kelly Results (from the past 240 hour(s))  Urine culture     Status: Abnormal   Collection Time: 03/22/17  6:13 PM  Result Value Ref Range Status   Specimen Description URINE, RANDOM  Final   Special Requests NONE  Final   Culture (A)  Final    >=100,000 COLONIES/mL ESCHERICHIA COLI 40,000 COLONIES/mL PROTEUS MIRABILIS ESCHERICHIA COLI IS A Confirmed Extended Spectrum Beta-Lactamase Producer (ESBL)    Report Status 03/26/2017 FINAL  Final   Organism ID, Bacteria ESCHERICHIA COLI (A)  Final   Organism ID, Bacteria PROTEUS MIRABILIS (A)  Final       Susceptibility   Escherichia coli - MIC*    AMPICILLIN >=32 RESISTANT Resistant     CEFAZOLIN >=64 RESISTANT Resistant     CEFTRIAXONE >=64 RESISTANT Resistant     CIPROFLOXACIN >=4 RESISTANT Resistant     GENTAMICIN >=16 RESISTANT Resistant     IMIPENEM <=0.25 SENSITIVE Sensitive     NITROFURANTOIN <=16 SENSITIVE Sensitive     TRIMETH/SULFA <=20 SENSITIVE Sensitive     AMPICILLIN/SULBACTAM >=32 RESISTANT Resistant     PIP/TAZO 16 SENSITIVE Sensitive     Extended ESBL POSITIVE Resistant     * >=100,000 COLONIES/mL ESCHERICHIA COLI   Proteus mirabilis - MIC*    AMPICILLIN >=32 RESISTANT Resistant     CEFAZOLIN 8 SENSITIVE Sensitive     CEFTRIAXONE <=1 SENSITIVE Sensitive     CIPROFLOXACIN <=0.25 SENSITIVE Sensitive     GENTAMICIN <=1 SENSITIVE Sensitive     IMIPENEM 4 SENSITIVE Sensitive     NITROFURANTOIN 128 RESISTANT Resistant     TRIMETH/SULFA >=320 RESISTANT Resistant     AMPICILLIN/SULBACTAM 8 SENSITIVE Sensitive     PIP/TAZO <=4 SENSITIVE Sensitive     * 40,000 COLONIES/mL PROTEUS MIRABILIS  Blood Culture (routine x 2)     Status: None (Preliminary result)   Collection Time: 03/22/17  8:05 PM  Result Value Ref Range Status   Specimen Description BLOOD LEFT ARM  Final   Special Requests   Final    BOTTLES DRAWN AEROBIC ONLY Blood Culture adequate volume   Culture NO GROWTH 3 DAYS  Final   Report Status PENDING  Incomplete  Blood Culture (routine x 2)     Status: None (Preliminary result)   Collection Time: 03/22/17  8:17 PM  Result Value Ref Range Status   Specimen Description BLOOD RIGHT HAND  Final   Special Requests IN PEDIATRIC BOTTLE Blood Culture adequate volume  Final   Culture NO GROWTH 3 DAYS  Final   Report Status PENDING  Incomplete       Blood Culture    Component Value Date/Time   SDES BLOOD RIGHT HAND 03/22/2017 2017   SPECREQUEST IN PEDIATRIC BOTTLE Blood Culture adequate volume 03/22/2017 2017   CULT NO GROWTH 3 DAYS 03/22/2017 2017   REPTSTATUS  PENDING 03/22/2017 2017      Labs: Results for orders placed or performed during the hospital encounter of 03/22/17 (from the past 48 hour(s))  CBC     Status: Abnormal   Collection Time: 03/25/17  4:59 AM  Result Value Ref Range   WBC 4.1 4.0 - 10.5 K/uL   RBC 3.18 (L) 3.87 - 5.11 MIL/uL   Hemoglobin 9.3 (L) 12.0 - 15.0 g/dL   HCT 28.0 (L) 36.0 - 46.0 %  MCV 88.1 78.0 - 100.0 fL   MCH 29.2 26.0 - 34.0 pg   MCHC 33.2 30.0 - 36.0 g/dL   RDW 18.8 (H) 11.5 - 15.5 %   Platelets 124 (L) 150 - 400 K/uL  Basic metabolic panel     Status: Abnormal   Collection Time: 03/25/17  4:59 AM  Result Value Ref Range   Sodium 137 135 - 145 mmol/L   Potassium 4.2 3.5 - 5.1 mmol/L   Chloride 116 (H) 101 - 111 mmol/L   CO2 15 (L) 22 - 32 mmol/L   Glucose, Bld 78 65 - 99 mg/dL   BUN 35 (H) 6 - 20 mg/dL   Creatinine, Ser 2.19 (H) 0.44 - 1.00 mg/dL   Calcium 7.8 (L) 8.9 - 10.3 mg/dL   GFR calc non Af Amer 21 (L) >60 mL/min   GFR calc Af Amer 24 (L) >60 mL/min    Comment: (NOTE) The eGFR has been calculated using the Kelly EPI equation. This calculation has not been validated in all clinical situations. eGFR's persistently <60 mL/min signify possible Chronic Kidney Disease.    Anion gap 6 5 - 15  TSH     Status: None   Collection Time: 03/25/17  9:09 AM  Result Value Ref Range   TSH 0.533 0.350 - 4.500 uIU/mL    Comment: Performed by a 3rd Generation assay with a functional sensitivity of <=0.01 uIU/mL.  T4, free     Status: Abnormal   Collection Time: 03/25/17  9:09 AM  Result Value Ref Range   Free T4 1.53 (H) 0.61 - 1.12 ng/dL    Comment: (NOTE) Biotin ingestion may interfere with free T4 tests. If the results are inconsistent with the TSH level, previous test results, or the clinical presentation, then consider biotin interference. If needed, order repeat testing after stopping biotin.   Magnesium     Status: Abnormal   Collection Time: 03/25/17  9:09 AM  Result Value Ref Range    Magnesium 1.5 (L) 1.7 - 2.4 mg/dL  Troponin I     Status: Abnormal   Collection Time: 03/25/17  9:09 AM  Result Value Ref Range   Troponin I 0.04 (HH) <0.03 ng/mL    Comment: CRITICAL RESULT CALLED TO, READ BACK BY AND VERIFIED WITH: A.MABE,RN 03/25/17 1040 BY BSLADE   Comprehensive metabolic panel     Status: Abnormal   Collection Time: 03/26/17  4:35 AM  Result Value Ref Range   Sodium 138 135 - 145 mmol/L   Potassium 4.1 3.5 - 5.1 mmol/L   Chloride 117 (H) 101 - 111 mmol/L   CO2 15 (L) 22 - 32 mmol/L   Glucose, Bld 82 65 - 99 mg/dL   BUN 34 (H) 6 - 20 mg/dL   Creatinine, Ser 1.86 (H) 0.44 - 1.00 mg/dL   Calcium 7.9 (L) 8.9 - 10.3 mg/dL   Total Protein 4.3 (L) 6.5 - 8.1 g/dL   Albumin 1.9 (L) 3.5 - 5.0 g/dL   AST 20 15 - 41 U/L   ALT 13 (L) 14 - 54 U/L   Alkaline Phosphatase 140 (H) 38 - 126 U/L   Total Bilirubin 0.5 0.3 - 1.2 mg/dL   GFR calc non Af Amer 26 (L) >60 mL/min   GFR calc Af Amer 30 (L) >60 mL/min    Comment: (NOTE) The eGFR has been calculated using the Kelly EPI equation. This calculation has not been validated in all clinical situations. eGFR's persistently <60 mL/min signify possible  Chronic Kidney Disease.    Anion gap 6 5 - 15  CBC     Status: Abnormal   Collection Time: 03/26/17  4:35 AM  Result Value Ref Range   WBC 4.1 4.0 - 10.5 K/uL   RBC 3.23 (L) 3.87 - 5.11 MIL/uL   Hemoglobin 9.4 (L) 12.0 - 15.0 g/dL   HCT 28.1 (L) 36.0 - 46.0 %   MCV 87.0 78.0 - 100.0 fL   MCH 29.1 26.0 - 34.0 pg   MCHC 33.5 30.0 - 36.0 g/dL   RDW 18.2 (H) 11.5 - 15.5 %   Platelets 144 (L) 150 - 400 K/uL     Lipid Panel     Component Value Date/Time   CHOL 100 03/23/2017 0226   TRIG 61 03/23/2017 0226   HDL 41 03/23/2017 0226   CHOLHDL 2.4 03/23/2017 0226   VLDL 12 03/23/2017 0226   LDLCALC 47 03/23/2017 0226     Lab Results  Component Value Date   HGBA1C 5.8 (H) 03/23/2017   HGBA1C 5.7 01/28/2013   HGBA1C 5.5 11/28/2011       HPI   76 y.o. female with  history of large B cell lymphoma-ongoing Kelly, Kelly Kelly Ray, Kelly Kelly Ray, Kelly Kelly Ray, Kelly Kelly Ray, Kelly Kelly Ray, Kelly Kelly Ray, Kelly on chronic renal failure with hyperkalemia and urinary frequency.  History taken from chart review, husband, and patient. She was found to be septic due to UTI and started on meropenum for treatment as well as IVF for hypotension with tachycardia and tachypnea.  Hospital course significant for pancytopenia and she was treated with 2 units PRBC. She continues to be limited by Kelly Ray affecting mobility , discharged with home health  HOSPITAL COURSE:  # New-onset atrial flutter with 2 :1 block,resolved  Troponin abnormal this admission 0.05>0.10>0.04 , 2-D echo shows change in EF compared to January 2018 Atrial flutter   likely in the setting of sepsis, hypomagnesemia, reduced EF, resolved Continue metoprolol TSH 0.53, free T4 1.53 Cardiology consult deferred for now , 2-D echo with results as above, patient follows Kelly Ray Geralyn Flash is now back in NSR , she will need to follow-up with cardiology in the outpatient setting as soon as possible     # Sepsis due to urinary tract infection: Patient with history of ESBL ESCHERICHIA COLI, AND PROTEUS UTI. Treated with IV meropenem. Based on urine culture patient has been switched to Fosfomycin 2 more doses  # Pancytopenia/iron deficiency Kelly Kelly Ray: Received 2 units of red blood cell transfusion yesterday. WBC count improved. Monitor CBC, hemoglobin stable around 9.3 prior to discharge. Patient with history of lymphoma. Recommended outpatient follow-up with oncologist.  #Hyperlipidemia: Continue statin  # Hypertension: Noted episode of hypotension upon admission,ssure better now. Continue metoprolol as tolerated. Monitor blood pressure closely.  #Hypothyroidism: Continue Synthroid  #Moderate protein calorie malnutrition: Dietary consult. Change to  regular diet.  #Mild elevation in troponin likely demand ischemia secondary due to sepsis and Kelly Kelly Ray: Patient has no chest pain. Continue aspirin. 2-D echo as above   # Kelly kidney injury on chronic kidney disease is stage III: baseline 1.5 . Serum creatinine level mildly elevated 2.14 likely in the setting of UTI and may be contributed by hypertensive episode yesterday. Monitor BMP. Creatinine down to 1.86. Avoid nephrotoxins. hydrated with Normal saline at 100 mL/h. Creatinine  2.11>2.19>1.88   #Physical deconditioning/generalized Kelly Ray and mostly weak on left side since starting Kelly:  Denied CIR , will need HH   #  Diffuse large B-cell lymphoma: Recommended outpatient follow-up with oncologist Kelly Ray. Learta Codding.     Discharge Exam:   Blood pressure 126/87, pulse 88, temperature 98.6 F (37 C), temperature source Oral, resp. rate 20, height _0  (1.575 m), weight 72.1 kg (158 lb 15.2 oz), SpO2 100 %.   Cardiovascular system: Regular rate rhythm S1-S2 normal. No pedal edema. Gastrointestinal system: Abdomen soft, nontender, nondistended. [place] positive Central nervous system: Alert and oriented. No focal neurological deficits. Skin: No rashes, lesions or ulcers Psychiatry: Judgement and insight appear normal. Mood & affect appropriate   Follow-up Information    Troy Sine, MD. Call.   Specialty:  Cardiology Why:  Call office to make appointment ASAP, New Diffuse hypokinesis on 2-D echo, EF 40%, Contact information: [address] [place] [address] [phone]        Forrest Moron, MD. Call.   Specialty:  Internal Medicine Why:  Hospital follow-up in 3-5 days Contact information: [place] [address] [phone]           Signed: Reyne Dumas 03/26/2017, 9:18 AM        Time spent >1 hour

## 2017-03-27 LAB — CULTURE, BLOOD (ROUTINE X 2)
Culture: NO GROWTH
Culture: NO GROWTH
Special Requests: ADEQUATE
Special Requests: ADEQUATE

## 2017-04-12 ENCOUNTER — Encounter (HOSPITAL_COMMUNITY): Payer: Self-pay

## 2017-04-12 ENCOUNTER — Other Ambulatory Visit (HOSPITAL_COMMUNITY): Payer: Self-pay | Admitting: Internal Medicine

## 2017-04-12 ENCOUNTER — Ambulatory Visit (HOSPITAL_COMMUNITY)
Admission: RE | Admit: 2017-04-12 | Discharge: 2017-04-12 | Disposition: A | Discharge status: Home / Self Care | Payer: Medicare Other | Source: Ambulatory Visit | Attending: Internal Medicine | Admitting: Internal Medicine

## 2017-04-12 DIAGNOSIS — G8194 Hemiplegia, unspecified affecting left nondominant side: Secondary | ICD-10-CM | POA: Diagnosis not present

## 2017-04-12 DIAGNOSIS — R51 Headache: Secondary | ICD-10-CM | POA: Diagnosis not present

## 2017-04-12 DIAGNOSIS — I639 Cerebral infarction, unspecified: Secondary | ICD-10-CM

## 2017-04-16 ENCOUNTER — Telehealth: Payer: Self-pay | Admitting: Family Medicine

## 2017-04-16 NOTE — Telephone Encounter (Signed)
Patient's husband called in stating that she is having a bad yeast infection and wants to know if there is something that can be called into the [facility] on [address]. He states it can either be RX or over the counter he just wants something that is going to help his wife.  His call back number is [phone].

## 2017-04-17 DIAGNOSIS — H8110 Benign paroxysmal vertigo, unspecified ear: Secondary | ICD-10-CM

## 2017-04-18 ENCOUNTER — Telehealth: Payer: Self-pay | Admitting: Family Medicine

## 2017-04-18 ENCOUNTER — Telehealth: Payer: Self-pay | Admitting: *Deleted

## 2017-04-18 DIAGNOSIS — C801 Malignant (primary) neoplasm, unspecified: Secondary | ICD-10-CM

## 2017-04-18 MED ORDER — ALPRAZOLAM 0.5 MG PO TABS: 0.5000 mg | ORAL_TABLET | Freq: Every evening | ORAL | 0 refills | Status: AC | PRN

## 2017-04-18 MED ORDER — ZOLPIDEM TARTRATE 5 MG PO TABS: 5.0000 mg | ORAL_TABLET | Freq: Every day | ORAL | 0 refills | Status: AC

## 2017-04-18 NOTE — Telephone Encounter (Signed)
I have increased the xanax dose to 0.5mg .  Please let the patient's husband know. I have also prescribed ambien 5mg .  Please phone in both.  The hospice referral is being processed right away if he asks.  Thank you.

## 2017-04-18 NOTE — Telephone Encounter (Signed)
Referral faxed to [facility] on 04/18/17. I called and left a message for their nurse to call back and confirm they received referral.

## 2017-04-18 NOTE — Telephone Encounter (Signed)
Message given to [place]. She is working on stat referral.

## 2017-04-18 NOTE — Telephone Encounter (Signed)
Spoke with patient's husband. He stated that the yeast infection is better, pt was treated with a cream. He doesn't know the name of it, given at [facility]. He would like a medication to "calm her nerves". He was not sure of the dosage tht she is taken. I spoke with Estill Bamberg, Nurse, at [facility], she stated that she is getting Xanax, half of 0.25 mg during the day after 1 hour of sleep,and between 9pm-7am she gets Ambien 5 mg at night prn, but she has been getting "religiously" every night. He doesn't feel that meds is "strong enough". He stated she is having a hard time sleeping and he is suppose to take her home tomorrow. pls advise, he can be reached at [phone]. [facility] [address]

## 2017-04-18 NOTE — Telephone Encounter (Signed)
Thank you greatly!!!

## 2017-04-18 NOTE — Telephone Encounter (Signed)
Please set up a stat referral for Hospice for this patient.  She has end stage cancer and is at Rehab and not responding.

## 2017-04-18 NOTE — Telephone Encounter (Addendum)
PATIENT'S HUSBAND (MICHAEL) WOULD LIKE DR. STALLINGS TO KNOW THAT HE NEEDS HER HELP IN GETTING HIS WIFE A REFERRAL TO HOSPICE. SHE IS CURRENTLY AT [facility] WHERE SHE IS HAVING REHABILITATION FROM BEING IN THE HOSPITAL. SHE IS NOT RESPONDING SO HE JUST WANTS TO BRING HER HOME. HE SAID THIS IS VERY URGENT AND HE WOULD LIKE ANOTHER PROVIDER TO CALL HIM BACK SINCE DR. STALLINGS WILL NOT BE BACK INTO THE OFFICE UNTIL Saturday. BEST PHONE [phone] (CELL-MR. MICHAEL Harn) [facility]. [place] CALLED BACK AND SAID HIS WIFE IS HAVING A HARD TIME SLEEPING. HE WOULD LIKE TO KNOW IF SHE CAN GET SOMETHING CALLED INTO THE PHARMACY. [facility] [address] [place]

## 2017-04-18 NOTE — Telephone Encounter (Signed)
Please advise 

## 2017-04-18 NOTE — Telephone Encounter (Signed)
error 

## 2017-04-23 ENCOUNTER — Telehealth: Payer: Self-pay | Admitting: Family Medicine

## 2017-04-23 ENCOUNTER — Telehealth: Payer: Self-pay

## 2017-04-23 NOTE — Telephone Encounter (Signed)
THIS MESSAGE IS FROM PAT FROM [facility] [place]. STALLINGS: PATIENT PASSED AWAY ON 12/02/2022 (Apr 28, 2017) AT HOME. SHE WAS UNDER HOSPICE CARE. THE HOSPICE NURSE GAVE [facility] DR. Nolon Rod NAME. SHE NEEDS TO KNOW IF DR. STALLINGS WILL SIGN HER DEATH CERTIFICATE? SHE WILL BE CREMATED SO IT NEEDS TO BE SIGNED AS SOON AS POSSIBLE. PAT SAID SHE CAN BRING THE DEATH CERTIFICATE BY AT ANY TIME. BEST PHONE [phone] (PAT FROM [facility]) [place]

## 2017-04-23 NOTE — Telephone Encounter (Signed)
Dr Nolon Rod, please advise, thank you

## 2017-04-23 NOTE — Telephone Encounter (Signed)
Was going to forward the message to doctor; however, according to last message(s) patient has passed away.

## 2017-04-23 NOTE — Telephone Encounter (Signed)
Lm on vm (Pat) pt DC ready for p/u at front desk at 102.  Advised faxed over by in error.  Call office with questions or concerns. DGaddy, CMA

## 2017-04-23 NOTE — Telephone Encounter (Signed)
Called and notified left a message for Fraser Din to bring the death certificate to [address]. I will complete it right away.  Please leave it on my computer at 104 so I can do this right away.

## 2017-04-27 DEATH — deceased

## 2017-05-01 NOTE — Telephone Encounter (Signed)
Called household was advised by pt husband that patient has passed away.

## 2017-06-03 ENCOUNTER — Ambulatory Visit: Payer: Medicare Other | Admitting: Nurse Practitioner

## 2017-06-06 IMAGING — CT CT HEAD WO/W CM
3 of 4 series · 15 of 47 positions shown, 18 images · IV contrast (ISOVUE 300)
Comparison: CT head 07/14/2015

CLINICAL DATA: Diffuse large B-cell lymphoma. Unsteady gait.
Bilateral lower extremity pain

EXAM:
CT HEAD WITHOUT AND WITH CONTRAST
TECHNIQUE: Contiguous axial images were obtained from the base of the skull
through the vertex without and with intravenous contrast
CONTRAST:  75mL G9JO4C-YPP IOPAMIDOL (G9JO4C-YPP) INJECTION 61%

[Series 2: head wo · axial · 0.47mm/px · z∈[+1459,+1574]mm · 9 of 27 slices shown, 12 images]
[im 2/27  brain]
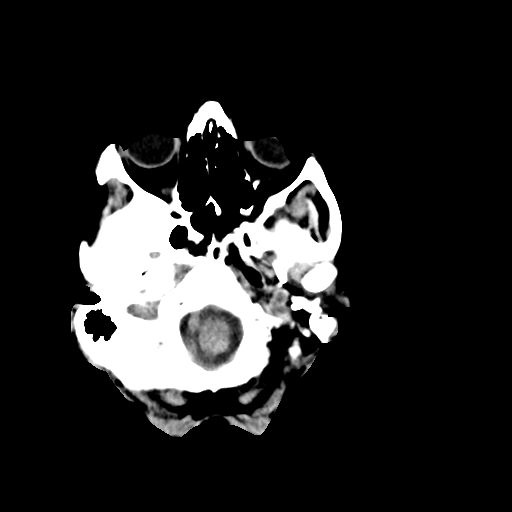
[im 2/27  bone]
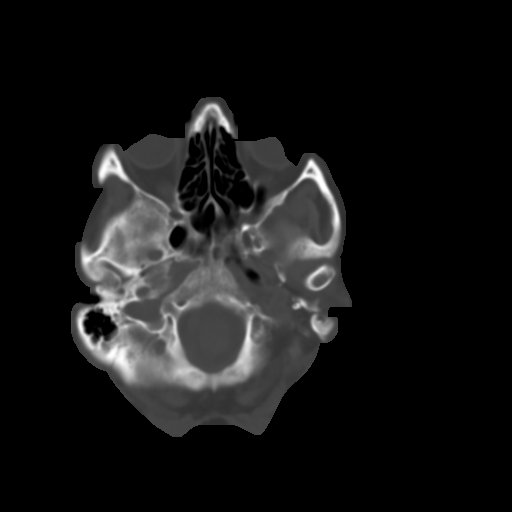
[im 6/27  brain]
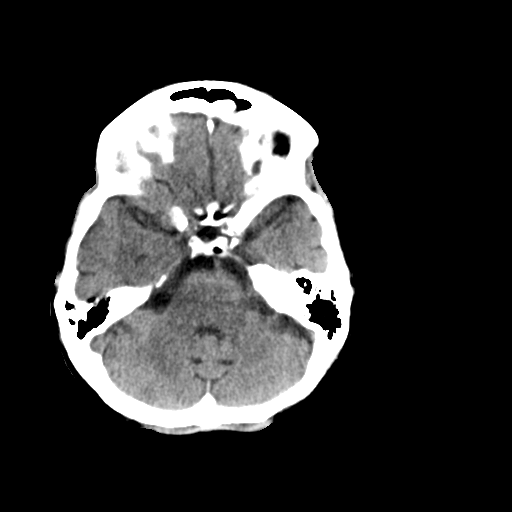
[im 8/27  brain]
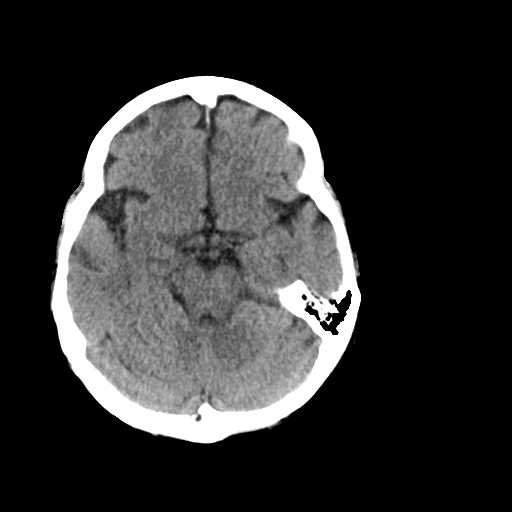
[im 12/27  brain]
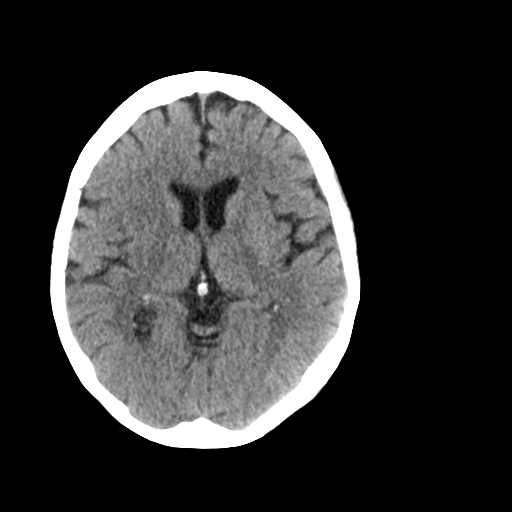
[im 14/27  brain]
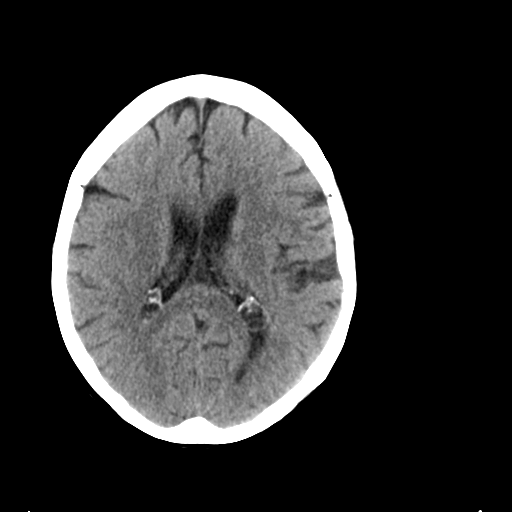
[im 14/27  bone]
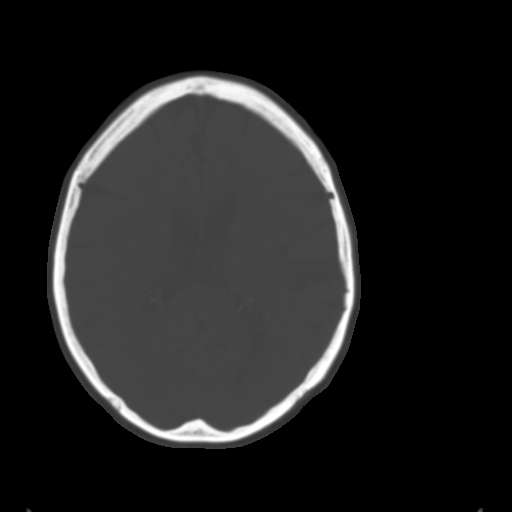
[im 15/27  brain]
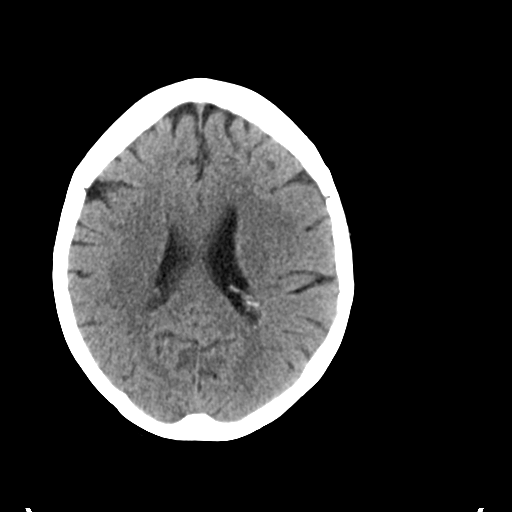
[im 19/27  brain]
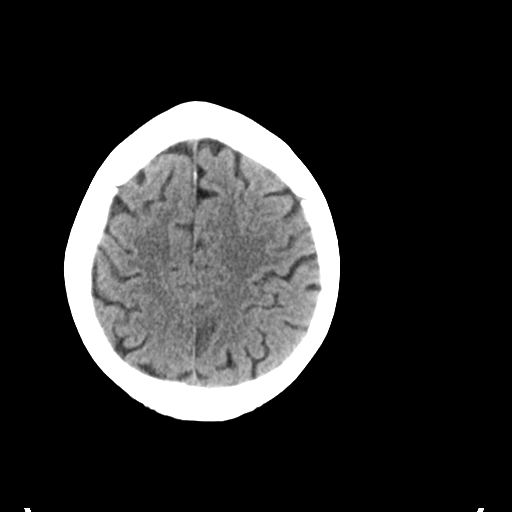
[im 21/27  brain]
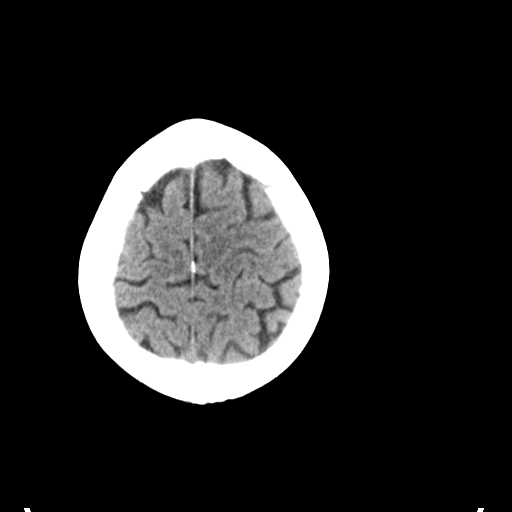
[im 25/27  brain]
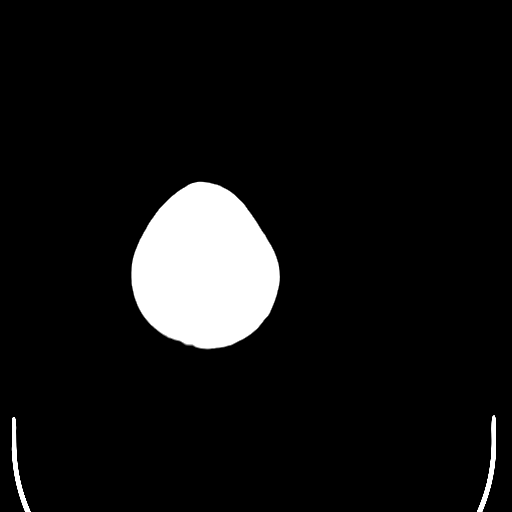
[im 25/27  bone]
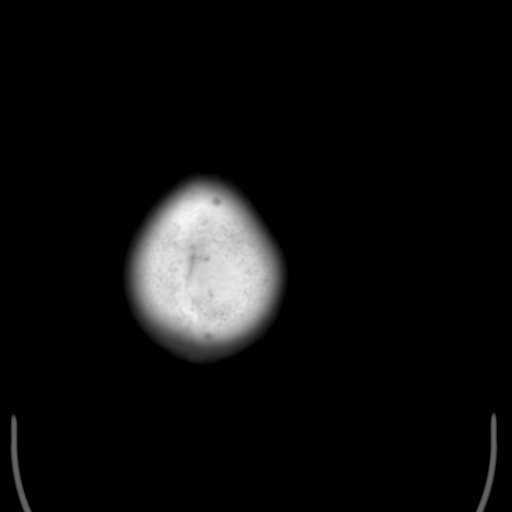

[Series 5: coronal soft tissue · coronal · 0.31mm/px · 3 of 61 slices shown]
[im 21/61  brain]
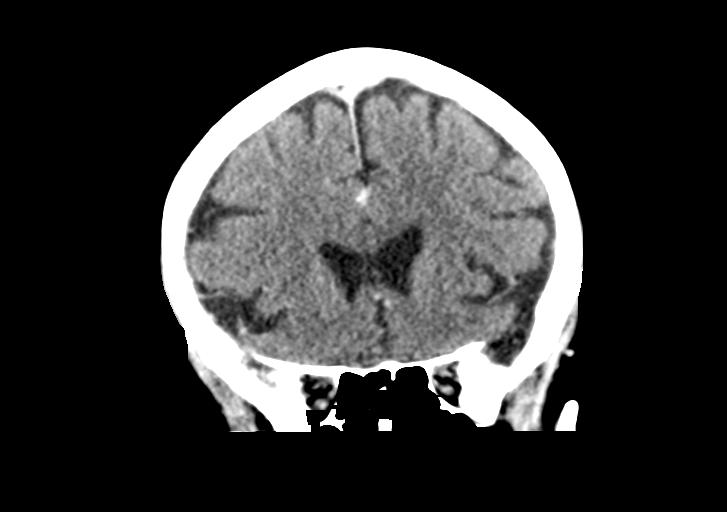
[im 27/61  brain]
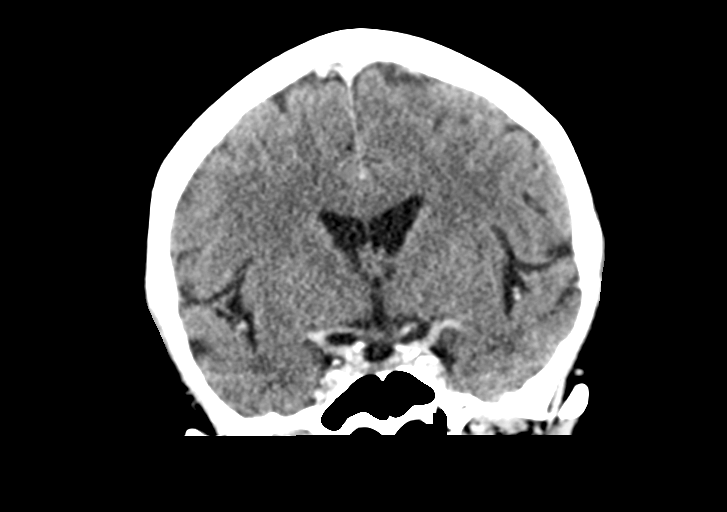
[im 34/61  brain]
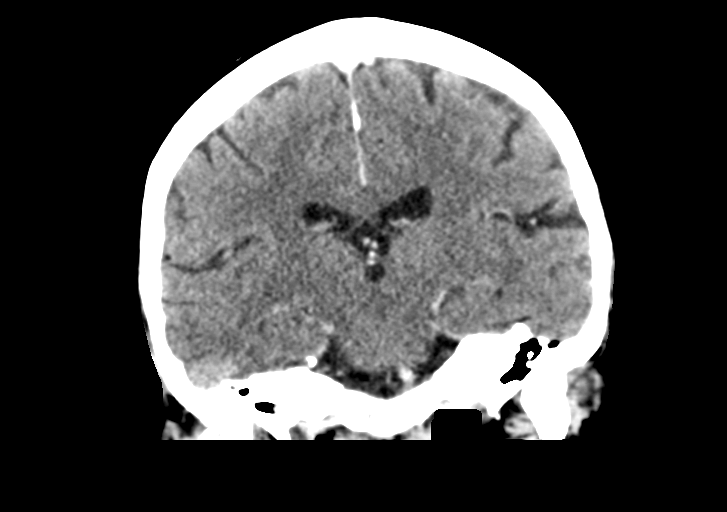

[Series 6: sagittal soft tissue · sagittal · 0.31mm/px · 3 of 50 slices shown]
[im 17/50  brain]
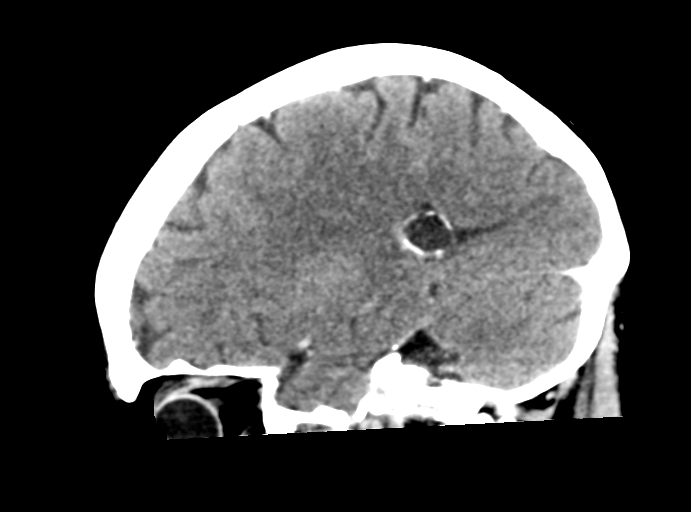
[im 25/50  brain]
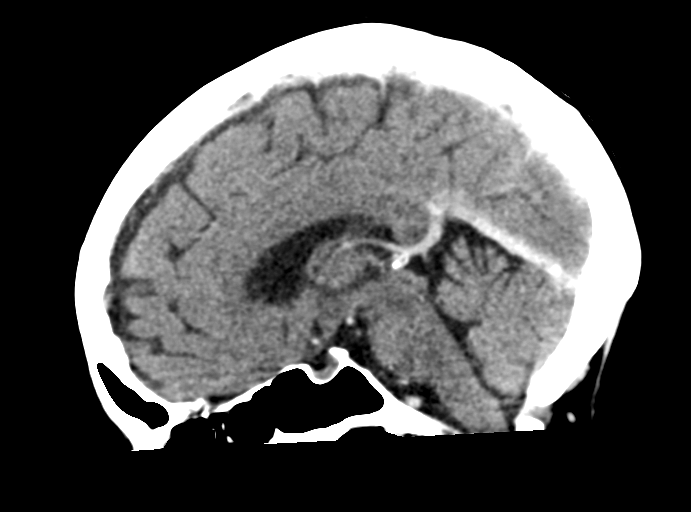
[im 33/50  brain]
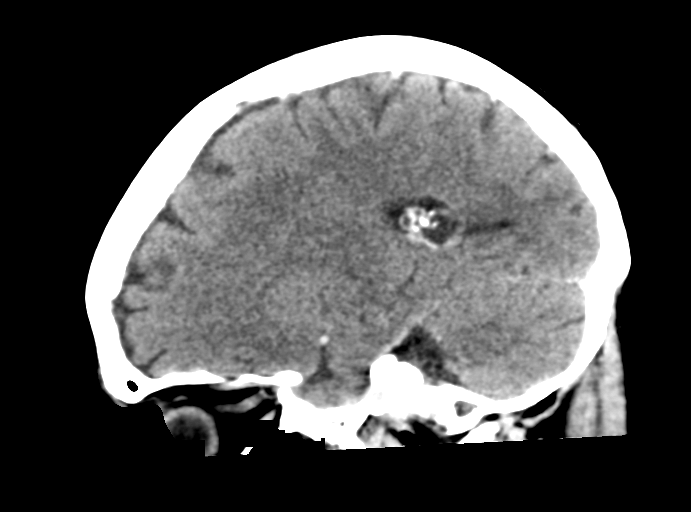

[15 of 47 positions shown; findings below may reference images not displayed]

FINDINGS: Brain: Cerebral volume normal for age. Ventricle size within normal
limits. Negative for acute infarct. Negative for hemorrhage mass or
edema.

Normal enhancement postcontrast infusion.

Vascular: Negative for hyperdense vessel

Skull: Negative

Sinuses/Orbits: Negative

Other: None
IMPRESSION: Negative CT head with contrast.

## 2017-06-26 ENCOUNTER — Ambulatory Visit (HOSPITAL_COMMUNITY): Payer: Medicare Other | Admitting: Psychiatry

## 2017-07-31 ENCOUNTER — Other Ambulatory Visit: Payer: Self-pay | Admitting: Nurse Practitioner
